# Patient Record
Sex: Female | Born: 1935 | Race: White | Hispanic: No | Smoking: Never smoker
Health system: Southern US, Community
[De-identification: ages and names within clinical notes are randomized; demographics above are authoritative.]

## PROBLEM LIST (undated history)

## (undated) DIAGNOSIS — Z5189 Encounter for other specified aftercare: Secondary | ICD-10-CM

## (undated) DIAGNOSIS — K819 Cholecystitis, unspecified: Secondary | ICD-10-CM

## (undated) DIAGNOSIS — M1990 Unspecified osteoarthritis, unspecified site: Secondary | ICD-10-CM

## (undated) DIAGNOSIS — E785 Hyperlipidemia, unspecified: Secondary | ICD-10-CM

## (undated) DIAGNOSIS — Z9889 Other specified postprocedural states: Secondary | ICD-10-CM

## (undated) DIAGNOSIS — D500 Iron deficiency anemia secondary to blood loss (chronic): Secondary | ICD-10-CM

## (undated) DIAGNOSIS — H269 Unspecified cataract: Secondary | ICD-10-CM

## (undated) DIAGNOSIS — R112 Nausea with vomiting, unspecified: Secondary | ICD-10-CM

## (undated) DIAGNOSIS — R0602 Shortness of breath: Secondary | ICD-10-CM

## (undated) DIAGNOSIS — H409 Unspecified glaucoma: Secondary | ICD-10-CM

## (undated) DIAGNOSIS — I2510 Atherosclerotic heart disease of native coronary artery without angina pectoris: Secondary | ICD-10-CM

## (undated) DIAGNOSIS — I219 Acute myocardial infarction, unspecified: Secondary | ICD-10-CM

## (undated) DIAGNOSIS — I340 Nonrheumatic mitral (valve) insufficiency: Secondary | ICD-10-CM

## (undated) DIAGNOSIS — R5383 Other fatigue: Secondary | ICD-10-CM

## (undated) DIAGNOSIS — I10 Essential (primary) hypertension: Secondary | ICD-10-CM

## (undated) DIAGNOSIS — K5792 Diverticulitis of intestine, part unspecified, without perforation or abscess without bleeding: Secondary | ICD-10-CM

## (undated) DIAGNOSIS — R42 Dizziness and giddiness: Secondary | ICD-10-CM

## (undated) DIAGNOSIS — E876 Hypokalemia: Secondary | ICD-10-CM

## (undated) DIAGNOSIS — K219 Gastro-esophageal reflux disease without esophagitis: Secondary | ICD-10-CM

## (undated) HISTORY — DX: Iron deficiency anemia secondary to blood loss (chronic): D50.0

## (undated) HISTORY — DX: Hypokalemia: E87.6

## (undated) HISTORY — DX: Acute myocardial infarction, unspecified: I21.9

## (undated) HISTORY — DX: Encounter for other specified aftercare: Z51.89

## (undated) HISTORY — DX: Gastro-esophageal reflux disease without esophagitis: K21.9

## (undated) HISTORY — DX: Dizziness and giddiness: R42

## (undated) HISTORY — DX: Other fatigue: R53.83

## (undated) HISTORY — DX: Atherosclerotic heart disease of native coronary artery without angina pectoris: I25.10

## (undated) HISTORY — PX: SHOULDER SURGERY: SHX246

## (undated) HISTORY — DX: Unspecified glaucoma: H40.9

## (undated) HISTORY — PX: SIGMOIDOSCOPY: SUR1295

## (undated) HISTORY — DX: Hyperlipidemia, unspecified: E78.5

## (undated) HISTORY — DX: Essential (primary) hypertension: I10

## (undated) HISTORY — DX: Unspecified cataract: H26.9

## (undated) HISTORY — DX: Nonrheumatic mitral (valve) insufficiency: I34.0

## (undated) HISTORY — PX: PARATHYROIDECTOMY: SHX19

## (undated) HISTORY — PX: CATARACT EXTRACTION: SUR2

## (undated) HISTORY — DX: Cholecystitis, unspecified: K81.9

## (undated) HISTORY — PX: ABDOMINAL HYSTERECTOMY: SHX81

---

## 2007-04-05 ENCOUNTER — Encounter: Admission: RE | Admit: 2007-04-05 | Discharge: 2007-06-19 | Payer: Self-pay | Admitting: Internal Medicine

## 2007-04-12 ENCOUNTER — Encounter: Admission: RE | Admit: 2007-04-12 | Discharge: 2007-04-12 | Payer: Self-pay | Admitting: Internal Medicine

## 2007-04-13 ENCOUNTER — Encounter: Admission: RE | Admit: 2007-04-13 | Discharge: 2007-04-13 | Payer: Self-pay | Admitting: Internal Medicine

## 2007-05-02 ENCOUNTER — Encounter: Admission: RE | Admit: 2007-05-02 | Discharge: 2007-05-02 | Payer: Self-pay | Admitting: Internal Medicine

## 2008-02-06 ENCOUNTER — Encounter: Admission: RE | Admit: 2008-02-06 | Discharge: 2008-03-27 | Payer: Self-pay | Admitting: Orthopedic Surgery

## 2008-04-01 ENCOUNTER — Encounter: Admission: RE | Admit: 2008-04-01 | Discharge: 2008-05-02 | Payer: Self-pay | Admitting: Orthopedic Surgery

## 2010-09-23 ENCOUNTER — Emergency Department (HOSPITAL_COMMUNITY): Payer: Medicare Other

## 2010-09-23 ENCOUNTER — Inpatient Hospital Stay (HOSPITAL_COMMUNITY)
Admission: EM | Admit: 2010-09-23 | Discharge: 2010-09-30 | DRG: 981 | Disposition: A | Discharge status: Home Health | Payer: Medicare Other | Attending: Surgery | Admitting: Surgery

## 2010-09-23 ENCOUNTER — Encounter (HOSPITAL_COMMUNITY): Payer: Self-pay | Admitting: Radiology

## 2010-09-23 DIAGNOSIS — R197 Diarrhea, unspecified: Secondary | ICD-10-CM | POA: Diagnosis present

## 2010-09-23 DIAGNOSIS — Z7982 Long term (current) use of aspirin: Secondary | ICD-10-CM

## 2010-09-23 DIAGNOSIS — R7309 Other abnormal glucose: Secondary | ICD-10-CM | POA: Diagnosis present

## 2010-09-23 DIAGNOSIS — I214 Non-ST elevation (NSTEMI) myocardial infarction: Secondary | ICD-10-CM | POA: Diagnosis not present

## 2010-09-23 DIAGNOSIS — I10 Essential (primary) hypertension: Secondary | ICD-10-CM | POA: Diagnosis present

## 2010-09-23 DIAGNOSIS — I4510 Unspecified right bundle-branch block: Secondary | ICD-10-CM | POA: Diagnosis present

## 2010-09-23 DIAGNOSIS — E785 Hyperlipidemia, unspecified: Secondary | ICD-10-CM | POA: Diagnosis present

## 2010-09-23 DIAGNOSIS — E876 Hypokalemia: Secondary | ICD-10-CM | POA: Diagnosis present

## 2010-09-23 DIAGNOSIS — R1011 Right upper quadrant pain: Secondary | ICD-10-CM

## 2010-09-23 DIAGNOSIS — K219 Gastro-esophageal reflux disease without esophagitis: Secondary | ICD-10-CM | POA: Diagnosis present

## 2010-09-23 DIAGNOSIS — R1310 Dysphagia, unspecified: Secondary | ICD-10-CM | POA: Diagnosis present

## 2010-09-23 DIAGNOSIS — Z6838 Body mass index (BMI) 38.0-38.9, adult: Secondary | ICD-10-CM

## 2010-09-23 DIAGNOSIS — I2510 Atherosclerotic heart disease of native coronary artery without angina pectoris: Secondary | ICD-10-CM | POA: Diagnosis present

## 2010-09-23 DIAGNOSIS — E669 Obesity, unspecified: Secondary | ICD-10-CM | POA: Diagnosis present

## 2010-09-23 DIAGNOSIS — K7689 Other specified diseases of liver: Secondary | ICD-10-CM | POA: Diagnosis present

## 2010-09-23 DIAGNOSIS — K819 Cholecystitis, unspecified: Principal | ICD-10-CM | POA: Diagnosis present

## 2010-09-23 DIAGNOSIS — E119 Type 2 diabetes mellitus without complications: Secondary | ICD-10-CM | POA: Diagnosis present

## 2010-09-23 LAB — URINALYSIS, ROUTINE W REFLEX MICROSCOPIC
Glucose, UA: NEGATIVE mg/dL
Hgb urine dipstick: NEGATIVE
Specific Gravity, Urine: 1.02 (ref 1.005–1.030)
pH: 6 (ref 5.0–8.0)

## 2010-09-23 LAB — CBC
HCT: 39.7 % (ref 36.0–46.0)
Hemoglobin: 14.3 g/dL (ref 12.0–15.0)
MCHC: 36 g/dL (ref 30.0–36.0)

## 2010-09-23 LAB — CK TOTAL AND CKMB (NOT AT ARMC)
CK, MB: 1.5 ng/mL (ref 0.3–4.0)
CK, MB: 2.4 ng/mL (ref 0.3–4.0)
CK, MB: 7.4 ng/mL (ref 0.3–4.0)
Relative Index: INVALID (ref 0.0–2.5)
Relative Index: INVALID (ref 0.0–2.5)

## 2010-09-23 LAB — DIFFERENTIAL
Basophils Absolute: 0 10*3/uL (ref 0.0–0.1)
Lymphocytes Relative: 8 % — ABNORMAL LOW (ref 12–46)
Lymphs Abs: 1.3 10*3/uL (ref 0.7–4.0)
Monocytes Absolute: 1.4 10*3/uL — ABNORMAL HIGH (ref 0.1–1.0)
Neutro Abs: 13.4 10*3/uL — ABNORMAL HIGH (ref 1.7–7.7)

## 2010-09-23 LAB — COMPREHENSIVE METABOLIC PANEL
ALT: 15 U/L (ref 0–35)
Albumin: 2.9 g/dL — ABNORMAL LOW (ref 3.5–5.2)
Alkaline Phosphatase: 79 U/L (ref 39–117)
Glucose, Bld: 161 mg/dL — ABNORMAL HIGH (ref 70–99)
Potassium: 2.9 mEq/L — ABNORMAL LOW (ref 3.5–5.1)
Sodium: 132 mEq/L — ABNORMAL LOW (ref 135–145)
Total Protein: 6.7 g/dL (ref 6.0–8.3)

## 2010-09-23 LAB — BASIC METABOLIC PANEL
CO2: 26 mEq/L (ref 19–32)
GFR calc non Af Amer: 42 mL/min — ABNORMAL LOW (ref >60)
Glucose, Bld: 148 mg/dL — ABNORMAL HIGH (ref 70–99)
Potassium: 3.5 mEq/L (ref 3.5–5.1)
Sodium: 132 mEq/L — ABNORMAL LOW (ref 135–145)

## 2010-09-23 LAB — APTT: aPTT: 28 seconds (ref 24–37)

## 2010-09-23 LAB — PROTIME-INR: INR: 1.12 (ref 0.00–1.49)

## 2010-09-23 LAB — GLUCOSE, CAPILLARY

## 2010-09-23 MED ORDER — IOHEXOL 300 MG/ML  SOLN
75.0000 mL | Freq: Once | INTRAMUSCULAR | Status: AC | PRN
  Administered 2010-09-23: 75 mL via INTRAVENOUS

## 2010-09-24 HISTORY — PX: CORONARY ANGIOPLASTY WITH STENT PLACEMENT: SHX49

## 2010-09-24 LAB — HEPATIC FUNCTION PANEL
Albumin: 2.2 g/dL — ABNORMAL LOW (ref 3.5–5.2)
Bilirubin, Direct: 0.3 mg/dL (ref 0.0–0.3)
Total Bilirubin: 0.6 mg/dL (ref 0.3–1.2)

## 2010-09-24 LAB — BASIC METABOLIC PANEL
BUN: 26 mg/dL — ABNORMAL HIGH (ref 6–23)
CO2: 27 mEq/L (ref 19–32)
Calcium: 6.9 mg/dL — ABNORMAL LOW (ref 8.4–10.5)
Glucose, Bld: 138 mg/dL — ABNORMAL HIGH (ref 70–99)
Sodium: 134 mEq/L — ABNORMAL LOW (ref 135–145)

## 2010-09-24 LAB — HEMOGLOBIN A1C: Hgb A1c MFr Bld: 6.1 % — ABNORMAL HIGH (ref <5.7)

## 2010-09-24 LAB — CARDIAC PANEL(CRET KIN+CKTOT+MB+TROPI)
CK, MB: 9.8 ng/mL (ref 0.3–4.0)
CK, MB: 7.8 ng/mL (ref 0.3–4.0)
Relative Index: 6.4 — ABNORMAL HIGH (ref 0.0–2.5)
Relative Index: 6.6 — ABNORMAL HIGH (ref 0.0–2.5)
Total CK: 154 U/L (ref 7–177)
Total CK: 118 U/L (ref 7–177)
Troponin I: 0.83 ng/mL (ref <0.30)

## 2010-09-24 LAB — GLUCOSE, CAPILLARY
Glucose-Capillary: 169 mg/dL — ABNORMAL HIGH (ref 70–99)
Glucose-Capillary: 89 mg/dL (ref 70–99)

## 2010-09-24 LAB — DIFFERENTIAL
Basophils Relative: 0 % (ref 0–1)
Lymphocytes Relative: 4 % — ABNORMAL LOW (ref 12–46)
Lymphs Abs: 0.7 10*3/uL (ref 0.7–4.0)
Monocytes Relative: 5 % (ref 3–12)
Neutro Abs: 16.9 10*3/uL — ABNORMAL HIGH (ref 1.7–7.7)
Neutrophils Relative %: 91 % — ABNORMAL HIGH (ref 43–77)

## 2010-09-24 LAB — LIPID PANEL
Cholesterol: 118 mg/dL (ref 0–200)
Triglycerides: 39 mg/dL (ref <150)

## 2010-09-24 LAB — CBC
HCT: 35.2 % — ABNORMAL LOW (ref 36.0–46.0)
Hemoglobin: 12.3 g/dL (ref 12.0–15.0)
MCH: 32.7 pg (ref 26.0–34.0)
RBC: 3.76 MIL/uL — ABNORMAL LOW (ref 3.87–5.11)

## 2010-09-24 LAB — MRSA PCR SCREENING: MRSA by PCR: NEGATIVE

## 2010-09-24 LAB — CK TOTAL AND CKMB (NOT AT ARMC): Relative Index: 5.9 — ABNORMAL HIGH (ref 0.0–2.5)

## 2010-09-25 ENCOUNTER — Inpatient Hospital Stay (HOSPITAL_COMMUNITY): Payer: Medicare Other

## 2010-09-25 LAB — CBC
MCH: 32.8 pg (ref 26.0–34.0)
MCHC: 34.9 g/dL (ref 30.0–36.0)
Platelets: 265 10*3/uL (ref 150–400)

## 2010-09-25 LAB — COMPREHENSIVE METABOLIC PANEL
ALT: 151 U/L — ABNORMAL HIGH (ref 0–35)
CO2: 30 mEq/L (ref 19–32)
Calcium: 8.1 mg/dL — ABNORMAL LOW (ref 8.4–10.5)
Creatinine, Ser: 0.8 mg/dL (ref 0.50–1.10)
GFR calc Af Amer: >60 mL/min (ref >60)
GFR calc non Af Amer: >60 mL/min (ref >60)
Glucose, Bld: 103 mg/dL — ABNORMAL HIGH (ref 70–99)
Sodium: 134 mEq/L — ABNORMAL LOW (ref 135–145)

## 2010-09-25 LAB — CARDIAC PANEL(CRET KIN+CKTOT+MB+TROPI): Total CK: 84 U/L (ref 7–177)

## 2010-09-25 LAB — GLUCOSE, CAPILLARY: Glucose-Capillary: 94 mg/dL (ref 70–99)

## 2010-09-25 MED ORDER — IOHEXOL 300 MG/ML  SOLN
50.0000 mL | Freq: Once | INTRAMUSCULAR | Status: AC | PRN
  Administered 2010-09-25: 17:00:00 10 mL via INTRAVENOUS

## 2010-09-26 LAB — GLUCOSE, CAPILLARY
Glucose-Capillary: 100 mg/dL — ABNORMAL HIGH (ref 70–99)
Glucose-Capillary: 100 mg/dL — ABNORMAL HIGH (ref 70–99)
Glucose-Capillary: 85 mg/dL (ref 70–99)

## 2010-09-27 LAB — CBC
HCT: 30.4 % — ABNORMAL LOW (ref 36.0–46.0)
MCH: 33.1 pg (ref 26.0–34.0)
MCHC: 34.9 g/dL (ref 30.0–36.0)
RDW: 13 % (ref 11.5–15.5)

## 2010-09-27 LAB — COMPREHENSIVE METABOLIC PANEL
Albumin: 2.1 g/dL — ABNORMAL LOW (ref 3.5–5.2)
Alkaline Phosphatase: 95 U/L (ref 39–117)
BUN: 13 mg/dL (ref 6–23)
Calcium: 8.5 mg/dL (ref 8.4–10.5)
GFR calc Af Amer: >60 mL/min (ref >60)
Glucose, Bld: 99 mg/dL (ref 70–99)
Potassium: 3 mEq/L — ABNORMAL LOW (ref 3.5–5.1)
Total Protein: 5.7 g/dL — ABNORMAL LOW (ref 6.0–8.3)

## 2010-09-27 LAB — GLUCOSE, CAPILLARY: Glucose-Capillary: 88 mg/dL (ref 70–99)

## 2010-09-28 LAB — HEMOGLOBIN A1C
Hgb A1c MFr Bld: 6.2 % — ABNORMAL HIGH (ref <5.7)
Mean Plasma Glucose: 131 mg/dL — ABNORMAL HIGH (ref <117)

## 2010-09-28 LAB — BODY FLUID CULTURE

## 2010-09-29 LAB — CBC
HCT: 33.4 % — ABNORMAL LOW (ref 36.0–46.0)
MCHC: 35.3 g/dL (ref 30.0–36.0)
Platelets: 312 10*3/uL (ref 150–400)
RDW: 12.6 % (ref 11.5–15.5)
WBC: 6.8 10*3/uL (ref 4.0–10.5)

## 2010-09-29 LAB — COMPREHENSIVE METABOLIC PANEL
ALT: 32 U/L (ref 0–35)
AST: 11 U/L (ref 0–37)
Albumin: 2.3 g/dL — ABNORMAL LOW (ref 3.5–5.2)
Alkaline Phosphatase: 88 U/L (ref 39–117)
BUN: 13 mg/dL (ref 6–23)
Chloride: 97 mEq/L (ref 96–112)
Potassium: 3 mEq/L — ABNORMAL LOW (ref 3.5–5.1)
Sodium: 138 mEq/L (ref 135–145)
Total Bilirubin: 0.4 mg/dL (ref 0.3–1.2)
Total Protein: 5.9 g/dL — ABNORMAL LOW (ref 6.0–8.3)

## 2010-09-30 LAB — POTASSIUM: Potassium: 3.5 mEq/L (ref 3.5–5.1)

## 2010-10-01 ENCOUNTER — Other Ambulatory Visit (INDEPENDENT_AMBULATORY_CARE_PROVIDER_SITE_OTHER): Payer: Self-pay | Admitting: General Surgery

## 2010-10-01 DIAGNOSIS — Z9049 Acquired absence of other specified parts of digestive tract: Secondary | ICD-10-CM

## 2010-10-01 NOTE — H&P (Signed)
[name], [name] NO.:  [id number]  MEDICAL RECORD NO.:  [id number]  LOCATION:  [facility]                         FACILITY:  [facility]  PHYSICIAN:  Leighton Ruff. Redmond Pulling, MD     DATE OF BIRTH:  September 21, 1933  DATE OF ADMISSION:  09/23/2010 DATE OF DISCHARGE:                             HISTORY & PHYSICAL   REFERRING PHYSICIAN:  Blanchie Dessert, MD  PRIMARY CARE DOCTOR:  Arlyss Repress, MD, [facility].  REASON FOR COMPLAINT:  Abdominal pain in the right upper abdomen.  BRIEF HISTORY:  The patient is a 77 year old white female who woke up yesterday complaining of chest and abdominal discomfort with some nausea, start around 6:30, it was in her chest or abdomen went to her back.  She could not really describe the pain she said "chest hurt."  It was constant.  She had some nausea with onset of the pain.  She tried taking some of her blood pressure medicines and subsequently vomited. She has a history of GERD, but this pain is different.  She has GERD symptoms at least three times per week and sounds like mostly when she is lying down.  She has problems with nausea and vomiting associated with her GERD.  This pain she describes as being worse than her GERD. It was relieved by medicine here in the ER.  We were asked to see after ER evaluation.  PAST MEDICAL HISTORY: 1. GERD for 10 plus years on a PPI. 2. Hypertension. 3. Dyslipidemia. 4. Glucose elevation, recently started on metformin. 5. Arthritis.  She says this is all over her back and joints are the     primary source of discomfort. 6. She has a history of dyspnea on exertion, underwent a stress test     and then a cardiac catheterization 15 plus years ago nothing since. 7. 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 8. Dysphagia.  She has had trouble swallowing pills for 15 plus years. 9. History of anxiety attacks in the past and some depression. 10. AODM on Metformin  PAST SURGICAL  HISTORY: 1. She had a parathyroidectomy. 2. She has had 2 cataract surgeries 3. Status post hysterectomy. 4. Status post lipoma resection 5. History of surgery on her right shoulder.  FAMILY HISTORY:  Mother died at 80 with dementia.  Father died with history of more than one MI and high blood pressure.  Two brothers, one died of alcohol abuse, one with coronary artery disease.  No sisters. She has two children, both in good health.  SOCIAL HISTORY:  Tobacco never.  Alcohol, she drinks bourbon more than a time less than a fifth per week for at least 10 years.  Drugs:  None. She is a widowed.  She worked [employer] all of her life.  REVIEW OF SYSTEMS:  Fever, none reported.  She thinks she had a fever yesterday.  Skin:  No changes.  Weight stable.  CV:  Positive for dizziness, it sounds orthostatic in nature, may be related to her blood pressure medicines.  No history of stroke or seizure.  Psych:  Positive for some mild increase depression since leaving her home and living in [place].  PULMONARY:  She has dyspnea on exertion walking as noted above, it has been ongoing for 15 years.  No orthopnea.  No PND.  She did snore actually got better after shoulder surgery.  No coughing or wheezing.  No documented apnea.  CARDIAC:  She denies any chest pain or palpitations.  Workup in the past was for DOE.  GI is positive for GERD. It is better with PPI but not completely relieved.  She has symptoms almost nightly.  She has occasional nausea and vomiting.  She has frequent diarrhea.  No blood in her stool.  No trouble voiding.  No odor discomfort.  Lower Extremities:  No edema or claudication.  She does have edema if she comes off her fluid-filled.  MUSCULOSKELETAL:  She has problem with her back and her knees and her hip which is chronic.  CURRENT MEDICATIONS: 1. Nexium 40 mg daily. 2. Chlorthalidone 25 mg daily. 3. Crestor 5 mg h.s. 4. Norvasc 2.5 mg daily. 5. Diovan 320 mg  daily. 6. Combigan ophthalmic 1 drops left eye b.i.d. 7. Metformin 500 mg tablets 1/2 tablet h.s. 8. Methocarbamol 500 mg 1-2 p.o. q.6 h. 9. Systane ophthalmic drops nightly p.r.n. for dry eyes.  ALLERGIES:  SULFA causes a rash.  PHYSICAL EXAMINATION:  GENERAL:  Well-nourished, well-developed overweight white female in no acute distress. VITAL SIGNS:  Temperature on admission was 98.2, heart rate is 111, blood pressure 118/57, and sats are 93% on room air.  The patient reports her height at 63 inches and weight 218 pounds. HEENT:  Head is normocephalic.  Ears, nose, throat, and mouth within normal limits. NECK:  Trachea is in midline and bruits.  No JVD. CHEST:  Clear to auscultation and percussion.  Nontender to palpation. CARDIAC:  Normal S1 and S2.  No murmurs or rubs.  Pulses are +2 and equal both extremities. ABDOMEN:  Soft, nondistended.  She is tender mostly in the right upper quadrant, but also the right lower quadrant.  No hernias, masses, or abscesses. GU/RECTAL:  Deferred. LYMPHADENOPATHY:  None palpated cervical or inguinal areas. SKIN:  No changes noted. NEUROLOGIC:  No focal deficits.  Cranial nerves II through XII grossly intact. PSYCH:  Normal affect.  LABORATORY DATA:  UA was normal.  Lipase 42.  CK is 40.  Troponin was less than 0.3.  Sodium is 132, potassiums 2.9, chloride is 91, CO2 is 26, BUN is 13, creatinine is 0.77, glucose is 161, total bilirubin is 0.9, alk phos 79, SGOT 14, SGPT is 15, white count of 16.1, hemoglobin is 14.3, hematocrit is 39.7, and platelets are 313,000.  DIAGNOSTICS:  Abdominal ultrasound shows no stones, no gallbladder wall thickening, and pericholecystic fluid, and did show a fatty liver.  CT scan showed gallbladder was distended with pericholecystic fluid and again no stones, no biliary dilatation.  Abnormal liver with steatosis and parenchymal abnormalities.  Analysis was suggestive of acute acalculous cholecystitis.  There is  no ascending and sigmoid diverticulosis, but no diverticulitis.  Also liver disease on CT.  IMPRESSION: 1. Acalculous cholecystitis. 2. Hepatic steatosis with a history of alcohol use. 3. Hypertension. 4. Gastroesophageal reflux disease. 5. Dyslipidemia. 6. Elevated glucose. 7. History of noncritical coronary artery disease with cardiac     catheterization 10 plus years ago. 8. History of parathyroid tumor resection. 9. History of anxiety and depression, currently on no recs. 10.Hypokalemia. 11.Body mass index of 38.6. 12.Dysphagia.  PLAN:  We are going to hydrate the patient, replace K, we will get an EKG and chest  x-ray.  I am going to start her on antibiotics and discuss cholecystectomy after she has been reviewed by Dr. Redmond Pulling. If there are EKG abnormalities will check cardiac enzymes.     Lydia Guiles, P.A.   ______________________________ Leighton Ruff. Redmond Pulling, MD    WDJ/MEDQ  D:  09/23/2010  T:  09/23/2010  Job:  [id number]  cc:   Arlyss Repress, MD  Electronically Signed by Earnstine Regal P.A. on 09/26/2010 09:49:40 PM Electronically Signed by Greer Pickerel M.D. on 10/01/2010 10:44:40 PM

## 2010-10-02 ENCOUNTER — Telehealth (INDEPENDENT_AMBULATORY_CARE_PROVIDER_SITE_OTHER): Payer: Self-pay | Admitting: General Surgery

## 2010-10-02 NOTE — Telephone Encounter (Signed)
[name] with [facility] called re: patient requesting dressing change orders. Patient has cholecystostomy tube and still had dressing from hospital around tube. [name] changed dressing, but called for order and request to teach family. I called [name] and gave okay for cleaning area with antibiotic soap, then covering with dry gauze. She will teach family and call with any problems. States tube is able to be flushed without issue.

## 2010-10-03 NOTE — Discharge Summary (Signed)
[name], [name] NO.:  [id number]  MEDICAL RECORD NO.:  [id number]  LOCATION:  [id number]                         FACILITY:  [facility]  PHYSICIAN:  Coralie Keens, M.D. DATE OF BIRTH:  02-Dec-1933  DATE OF ADMISSION:  09/23/2010 DATE OF DISCHARGE:  09/30/2010                              DISCHARGE SUMMARY   ADMISSION DIAGNOSES: 1. Acalculous cholecystitis. 2. Hepatic steatosis. 3. Hypertension. 4. Gastroesophageal reflux disease. 5. Dyslipidemia. 6. Elevated glucose, on low dose metformin. 7. Noncritical coronary artery disease with cardiac catheterization 10     years ago for dyspnea on exertion. 8. History of parathyroid tumor resection. 9. History of anxiety and depression, currently on no treatment. 10.Hypokalemia. 11.Body mass index of 38.6. 12.Dysphagia.  DISCHARGE DIAGNOSES: 1. Acalculous cholecystitis. 2. Non-ST elevation myocardial infarction.  Peak troponin was 0.91.     Status post cardiac catheterization on September 24, 2010, Dr. Einar Gip     with percutaneous transluminal coronary angioplasty and stenting of     the left anterior descending, 2 sites using Resolute stents into     the proximal descending left anterior descending and diagonal     branch of the left anterior descending. 3. Hypokalemia. 4. Hypertension. 5. Hepatic steatosis. 6. Gastroesophageal reflux disease. 7. Dyslipidemia. 8. Ongoing diarrhea. 9. Glucose elevation, on low-dose metformin. 10.Dysphagia. 11.History of anxiety and depression. 12.BMI of 38.  PROCEDURES: 1. Cardiac catheterization on September 24, 2010, Dr. Einar Gip, ejection     fraction of 60%.  Left anterior descending showed 80% stenosis in     the proximal left anterior descending and 80-90% stenosis of the     ostium of the diagonal.  Status post percutaneous transluminal     coronary angioplasty and stenting of these lesions with Resolute     drug-eluting stents. 2. Percutaneous cholecystostomy by Interventional  Radiology on September 26, 2010.  BRIEF HISTORY:  The patient is a 77 year old female who presented to the emergency room complaining of chest and abdominal discomfort with some nausea which started around 6:30 on the day of admission.  It was in her chest, her abdomen, and went to her back.  She did not really describe the pain.  She said it just hurts, it was constant.  She had some nausea with the onset.  She tried taking some of her blood pressure medicines and subsequently vomited.  She has a history of GERD, but this pain is different.  She had GERD symptoms at least three times per week and it sounds like it is most significant when she is lying down.  She has problems with nausea and vomiting associated with her GERD.  This pain is worse than her GERD.  It was relieved by medicines in the ER, and we were asked to see her after the ER evaluation.  Workup in the ER includes labs within a normal  UA and lipase of 42.  Troponin was less than 0.3.  Potassium was 2.9, creatinine was 0.77, BUN was 13, glucose was 161.  LFTs were normal on admission.  Abdominal ultrasound showed no stones and no gallbladder wall thickening or pericholecystic fluid.  It did show a fatty liver.  CT scan showed  the gallbladder was distended with fluid, again no stones and no biliary dilatation consistant with acalculous  Cholecystitis.  For further history and physical, please see the dictated note.  HOSPITAL COURSE:  The patient was seen in the ER and admitted.  Her potassium was extremely low, we started replacing that.  Ordered an EKG and a chest x-ray.  She subsequently had some arrhythmia in the ER while on the monitor. Started her on antibiotics.  She developed a sinus tachycardia with rate up to 136 with a right bundle branch block and some ST depression in the lateral leads.  She was then seen by Dr. Christen Butter, who ordered further CK and troponins.  She was found to have a non-ST elevation MI with  peak troponin of 0.91.  She underwent cardiac catheterization following morning by Dr. Einar Gip and had 2 stents placed, one in the proximal LAD and one in the diagonal.  The patient did well after that and was seen by IR and percutaneous drainage of her gallbladder.  She has been maintained on antibiotics.  She has made slow progress.  She has had problems with some diarrhea.  She felt she was a bit dizzy and uncomfortable ambulating.  She has been seen by OT and PT with recommendations for home health OT, PT, a home health aide, and home health nurse to help with the drain.  She has remained afebrile, making progress, and it was Dr. Trevor Mace opinion she would probably be ready for discharge in the a.m. of September 30, 2010.  We are going to send her home with the drain; home health to help with care of the percutaneous cholecystostomy.  We will continue her on antibiotics that is listed below.  We will have her come back and see Dr. Redmond Pulling in 2 weeks on October 14, 2010, at 9:00 a.m.  She is to contact Dr. Einar Gip for followup In his office in 2 weeks.  DISCHARGE MEDICATIONS:  She will continue: 1. Aspirin 81 mg daily. 2. Chlorthalidone 25 mg daily. 3. Combigan ophthalmic drops 1 drop to left eye b.i.d. 4. Crestor 5 mg daily. 5. Diovan 320 mg daily. 6. Metformin 500 mg half tablet daily. 7. Methocarbamol p.r.n. for muscle spasms. 8. Nexium 40 mg daily. 9. Systane Ultra OTC eye lubrication. 10.We have discontinued her amlodipine 2.5 mg every other day.  NEW MEDICINES: 1. Augmentin 875 one tablet b.i.d. for 7 days. 2. Hydrocodone/APAP 5/325 one-two q.4 p.r.n. 3. Metoprolol 25 mg b.i.d., to be refilled by Dr. Einar Gip. 4. Potassium chloride 20 mEq b.i.d. with meals for 7 days. Follow up     with Dr. Einar Gip. 5. Brilinta 90 mg b.i.d., follow up and refill by Dr. Einar Gip.  CONDITION ON DISCHARGE:  Improving.     Lydia Guiles, P.A.   ______________________________ Coralie Keens,  M.D.    WDJ/MEDQ  D:  09/29/2010  T:  09/30/2010  Job:  [id number]  cc:   Laverda Page, MD Arlyss Repress, MD  Electronically Signed by Earnstine Regal P.A. on 10/01/2010 10:46:50 AM Electronically Signed by Coralie Keens M.D. on 10/03/2010 10:45:03 AM

## 2010-10-12 ENCOUNTER — Encounter (INDEPENDENT_AMBULATORY_CARE_PROVIDER_SITE_OTHER): Payer: Self-pay | Admitting: General Surgery

## 2010-10-14 ENCOUNTER — Encounter (INDEPENDENT_AMBULATORY_CARE_PROVIDER_SITE_OTHER): Payer: Self-pay | Admitting: General Surgery

## 2010-10-14 ENCOUNTER — Ambulatory Visit (INDEPENDENT_AMBULATORY_CARE_PROVIDER_SITE_OTHER): Payer: Medicare Other | Admitting: General Surgery

## 2010-10-14 VITALS — BP 126/78 | HR 80 | Temp 96.0°F | Ht 63.0 in | Wt 206.6 lb

## 2010-10-14 DIAGNOSIS — K819 Cholecystitis, unspecified: Secondary | ICD-10-CM

## 2010-10-14 NOTE — Patient Instructions (Signed)
Flush gallbladder drain once daily.  Call for fever (temp >101.5), worsening abdominal pain, persistent nausea/vomiting, jaundice, or drain problems

## 2010-10-14 NOTE — Progress Notes (Signed)
[name] is a 77 y.o. female.    Chief Complaint  Patient presents with  . Other    f/u gb    HPI HPI 68 year old obese Caucasian female who I initially met in the hospital in late June 2012 with abdominal pain in the right upper abdomen. She was found to have acute acalculous cholecystitis. There were no stones demonstrated on ultrasound or CT imaging. She was initially planned to have a laparoscopic cholecystectomy however she developed chest pressure which prompted an EKG as well as cardiac enzymes. She was found to have a non-ST elevated myocardial infarction. Dr. Einar Gip performed a cardiac catheterization in place several drug-eluting stents into her left anterior descending, proximal descending left anterior and diagonal branch of her LAD. Because of the myocardial infarction she underwent placement of a percutaneous cholecystostomy tube on June 30.  She was discharged from the hospital on July 4. Since discharge she states that she's had problems with dizziness. She states that she'll get dizzy or lightheaded when she gets up from a seated position. She denies any nausea, vomiting, or abdominal pain. She denies any fevers or chills. She denies any jaundice, diarrhea or constipation. She states that her appetite is fairly normal. Currently her son is flushing her drain twice a day. She is draining about 4 ounces per day of bile from her cholecystostomy tube. She also complains of some shortness of breath. She is scheduled to see Dr. Einar Gip later today.  Past Medical History  Diagnosis Date  . Diabetes mellitus   . Hypertension   . GERD (gastroesophageal reflux disease)   . Dyslipidemia   . Coronary artery disease   . Hypokalemia   . Acalculous cholecystitis   . Myocardial infarction   . Dizzy   . Fatigue     Past Surgical History  Procedure Date  . Parathyroidectomy     tumor excision  . Abdominal hysterectomy   . Shoulder surgery     right  . Cataract extraction     2   . Coronary angioplasty with stent placement 09/24/10    drug eluting stents    Family History  Problem Relation Age of Onset  . Heart disease Father   . Heart disease Brother     Social History History  Substance Use Topics  . Smoking status: Never Smoker   . Smokeless tobacco: Not on file  . Alcohol Use: Yes    Allergies  Allergen Reactions  . Sulfur     Current Outpatient Prescriptions  Medication Sig Dispense Refill  . aspirin 81 MG tablet Take 81 mg by mouth daily.        . brimonidine-timolol (COMBIGAN) 0.2-0.5 % ophthalmic solution Place 1 drop into the left eye every 12 (twelve) hours.        . chlorthalidone (HYGROTON) 25 MG tablet Take 25 mg by mouth daily.        Marland Kitchen esomeprazole (NEXIUM) 40 MG capsule Take 40 mg by mouth daily before breakfast.        . HYDROcodone-acetaminophen (NORCO) 5-325 MG per tablet Take 1 tablet by mouth every 6 (six) hours as needed.        . metFORMIN (GLUMETZA) 500 MG (MOD) 24 hr tablet Take 500 mg by mouth daily with breakfast. Half tablet       . methocarbamol (ROBAXIN) 500 MG tablet Take 500 mg by mouth as needed.        . metoprolol succinate (TOPROL-XL) 25 MG 24 hr tablet Take  25 mg by mouth 2 (two) times daily.        Vladimir Faster Glycol-Propyl Glycol (SYSTANE ULTRA OP) Apply to eye.        . rosuvastatin (CRESTOR) 5 MG tablet Take 5 mg by mouth daily.        . Ticagrelor (BRILINTA) 90 MG TABS tablet Take 90 mg by mouth 2 (two) times daily.        . valsartan (DIOVAN) 320 MG tablet Take 320 mg by mouth daily.          Review of Systems Review of Systems  Constitutional: Negative for fever, chills and weight loss.  HENT: Negative.   Eyes: Negative.   Respiratory: Positive for shortness of breath.   Cardiovascular: Negative for chest pain.       Lightheadedness when getting up from seated position. +DOE  Gastrointestinal: Negative.   Genitourinary: Negative.   Skin: Negative.   Neurological: Negative.     Psychiatric/Behavioral: Negative.     Physical Exam Physical Exam  Vitals reviewed. Constitutional: She is oriented to person, place, and time. She appears well-developed and well-nourished.  HENT:  Head: Normocephalic and atraumatic.  Eyes: Pupils are equal, round, and reactive to light. No scleral icterus.  Neck: Normal range of motion. No tracheal deviation present.  Cardiovascular: Normal rate, regular rhythm and normal heart sounds.   Respiratory: Effort normal and breath sounds normal. No respiratory distress. She has no wheezes.  GI: Soft. Bowel sounds are normal. She exhibits no distension. There is no tenderness. There is no guarding.       Percutaneous drain Rt lat abdomina wall- drain site- no infection.  Some green bile in drain bag  Musculoskeletal: Normal range of motion.  Neurological: She is alert and oriented to person, place, and time.  Skin: Skin is warm and dry. She is not diaphoretic. No pallor.  Psychiatric: She has a normal mood and affect. Her behavior is normal. Judgment normal.     Blood pressure 126/78, pulse 80, temperature 96 F (35.6 C), height 5\' 3"  (1.6 m), weight 206 lb 9.6 oz (93.713 kg).  Data reviewed: I reviewed her history and physical from June 27 as well as her discharge summary from July 4.  Assessment/Plan  77 year old Caucasian female with recent acalculous acute cholecystitis status post percutaneous cholecystostomy tube placement and a non-ST elevated myocardial infarction status post cardiac stent placement.  She seems to be doing quite well with respect to her gallbladder. I've recommended that we leave the drain in for at least another 6 weeks. We will need to get a cholangiogram through the cholecystostomy tube in 6 weeks. This is to evaluate bile duct patency. If her bile duct are normal I recommend removal of the cholecystostomy tube. My preference is to leave her gallbladder alone for now and not offer her a cholecystectomy. Since  she did not have any evidence of gallstones on ultrasound or CT imaging I believe her chance of a repeat episode of cholecystitis his low. Moreover we need to avoid a cholecystectomy for at least 6 months because of her recent myocardial infarction. I contacted Dr. Irven Shelling office to see if they can see her earlier today because of her ongoing dizziness which sounds like it might be some orthostatic hypotension. I'll see her in 4 weeks.  Greer Pickerel M 10/14/2010, 10:12 AM

## 2010-10-15 ENCOUNTER — Telehealth (INDEPENDENT_AMBULATORY_CARE_PROVIDER_SITE_OTHER): Payer: Self-pay

## 2010-10-15 NOTE — Telephone Encounter (Signed)
Debbie from [facility] called regarding [name] to ask if she was able to take a shower/bath.  I paged Dr. Redmond Pulling who said that would be fine.

## 2010-10-17 NOTE — Cardiovascular Report (Signed)
[name], [name] NO.:  [id number]  MEDICAL RECORD NO.:  [id number]  LOCATION:  6526                         FACILITY:  [facility]  PHYSICIAN:  Laverda Page, MD DATE OF BIRTH:  08-17-1933  DATE OF PROCEDURE:  09/24/2010 DATE OF DISCHARGE:                           CARDIAC CATHETERIZATION   PROCEDURE PERFORMED:  Left heart catheterization including: 1. Left ventriculography. 2. Selective right and left coronary arteriography. 3. Percutaneous transluminal coronary angioplasty and kissing balloon     angioplasty and stenting of the proximal and mid left anterior     descending and diagonal 1 branch of the and left anterior     descending with implantation of 4.0 x 18 mm and a 3.5 x 9 mm     Resolute stent into the proximal and mid left anterior descending     and a 3.0 x 12 mm Resolute stent into the diagonal branch of the     left anterior descending.  INDICATIONS:  Ms. Mummey is a 77 year old female with history of hypertension who was admitted to the hospital with chest pain and also abdominal pain and was found to have acalculous cholecystitis.  In the interim, because of chest discomfort and abnormal EKG, cardiac markers were done which were positive for non-ST-elevation myocardial infarction.  Because of her multiple cardiovascular risk factors and the positive cardiac markers, she was brought to the Cardiac Catheterization Lab to evaluate her coronary anatomy.  HEMODYNAMIC DATA:  The left ventricular pressure was 119/10 with end- diastolic pressure of 21 mmHg.  Aortic pressure was 115/59 with a mean of 84 mmHg.  There was no pressure gradient across the aortic valve.  ANGIOGRAPHIC DATA:  Left ventricle:  The left ventricular systolic function was normal with ejection fraction of 60%.  There was no significant wall motion abnormality.  Right coronary artery:  The right coronary artery is a large-caliber and dominant vessel, smooth and  normal.  Left main coronary artery:  Left main coronary artery is a large-caliber vessel, smooth and normal.  LAD:  LAD is a very large-caliber vessel giving origin to very large diagonal 1.  It is smooth and normal except at the origin of the diagonal, there is an 80% stenosis followed in the proximal LAD and there is an 80-90% stenosis in the ostium of the diagonal 1.  Circumflex:  Circumflex artery is a very large-caliber vessel, it is smooth and normal.  INTERVENTIONAL DATA:  Successful PTCA and stenting of the proximal LAD and mid LAD with overlapping 4.0 x 18 mm and 3.5 x 9 mm Resolute stents which were deployed at 10 atmospheric pressure and 9 atmospheric pressure and postdilated with a 4.0 x 15 mm Langley Trek at 16 atmospheric pressure.  The 3.5-mm stent was postdilated the same stent balloon pulling it back gently within the previously placed stent.  Successful PTCA and stenting of the diagonal branch of the LAD with implantation of a 3.0 x 12 mm Resolute stent which was deployed at 8 atmospheric pressure for 30 seconds followed by gentle pullback into the proximal LAD and second dilatation at 10 atmospheric pressure for 20 seconds.  Overall stenosis was reduced from 80% in the LAD to 0%  and 90% in the diagonal 1 to 0%.  The kissing balloon angioplasty was performed with a 2.5 x 12 mm Trek balloon in the diagonal and the 3.5 x 9 mm Resolute stent balloon into the LAD at 10 atmospheric pressure in the diagonal and 12 atmospheric pressure in the LAD.  Overall excellent results were evident.  RECOMMENDATIONS:  The patient did very well.  A total of 215 mL of contrast was utilized for diagnostic and intervention procedure.  No immediate complications were noted.  RECOMMENDATIONS:  I have discussed the patient's situation with Dr. Greer Pickerel from [facility] regarding her cholecystitis.  The patient will be scheduled for a percutaneous drainage of her  gallbladder for acalculous cholecystitis.  I will take the patient over to my care.  She will potentially be discharged home probably in the next 48 hours.  TECHNIQUE OF PROCEDURE:  Under sterile precautions using a 6-French right radial access, a 6-French TIG #4 catheter was advanced into the ascending aorta and selective left and right coronary arteriography was performed via the catheter and then pulled out of body.  A 5-French pigtail catheter was utilized to perform left ventriculography in the RAO projection.  The catheter was then pulled out of the body.  TECHNIQUE OF INTERVENTION:  Using a Ikari 3.5 guide catheter, 6-French guide catheter, it was advanced into the ascending aorta and left main coronary artery was selectively engaged.  Using Intuition Medtronic guidewire, I was able to cross into the LAD.  I also used a Luge wire into the diagonal branch of the LAD.  Initially, predilatation was performed into the diagonal branch to protect it from stent jailing.  A 2.5 x 15 mm Trek balloon was utilized for performing angioplasty of the diagonal branch.  This was done from 8 atmospheric pressure to a peak of 10 atmospheric pressure from 20-60 seconds.  Having performed this, excellent results were evident.  Now, we proceeded with stenting of the LAD.  This stenting of the LAD with a 4.0 x 18 mm Resolute stent led to an edge dissection in the LAD and also the diagonal branch was compromised with haziness.  Because of a very large-sized diagonal, the previously double-wired LAD and diagonal guidewires were repositioned and I was able to cross the diagonal branch again through the stent struts.  I used a 2.5 x 15 mm trek balloon and second angioplasty was performed at 6 atmospheric pressure for 60 seconds.  In spite of this, there was persistent haziness and high-grade stenosis was evident. Hence we decided to stent this with a 3.0 x 12 mm Resolute stent which was stented with  excellent results at 8 atmospheric pressure for 30 seconds gently pulling it back into this stent strut and second inflation at 10 atmospheric pressure for 20 seconds.  This was followed by placement of a LAD distal edge dissection with a 3.5 x 9 mm Resolute stent.  The same stent balloon was utilized to perform kissing balloon angioplasty into the LAD and a new 2.5 x 12 mm Trek balloon was advanced into the diagonal branch and kissing balloon angioplasty was performed with simultaneous balloon inflations.  Having performed this, excellent results were evident.  During the procedure, intracoronary nitroglycerin was also administered.  The guidewires were withdrawn.  Angiography repeated.  Guide catheter disengaged and pulled out of body.  Overall, the patient tolerated the procedure well and no immediate complications.    Laverda Page, MD    JRG/MEDQ  D:  09/24/2010  T:  09/24/2010  Job:  [id number]  cc:   Arlyss Repress, MD  Electronically Signed by [name] MD on 10/17/2010 01:53:55 PM

## 2010-10-17 NOTE — Consult Note (Signed)
NAMELINETT, SALIZAR NO.:  [id number]  MEDICAL RECORD NO.:  [id number]  LOCATION:  2606                         FACILITY:  [facility]  PHYSICIAN:  Laverda Page, MD DATE OF BIRTH:  20-Jan-1934  DATE OF CONSULTATION:  09/23/2010 DATE OF DISCHARGE:                                CONSULTATION   REASON FOR CONSULTATION:  Preop cardiovascular examination.  HISTORY:  Ms. [name] is a very pleasant 77 year old female with history of hypertension and diabetes, controlled with metformin. She had been doing well until yesterday.  She had severe diffuse abdominal discomfort and chest discomfort and was extremely nauseous, could not eat eating much the whole day yesterday.  This morning when she woke up, her pain was mostly now localized to the right upper quadrant of her abdomen.  She presented to the emergency room where she was diagnosed with acalculous cholecystitis and she is scheduled for a cholecystectomy in the morning.  However, because of abnormal troponins, she has been consulted for further cardiac risk stratification.  The patient denies any chest pain, denies any shortness of breath, denies any paroxysmal nocturnal dyspnea or orthopnea.  Prior to the episode, she has been fairly active.  In fact, she has been taking care of her grandsons trying to help her divorced son taking care of the children while he is away on overseas travel.  She had been driving them and taking them around and dropping them off without any significant chest discomfort or dyspnea on exertion, PND, or orthopnea.  Presently, she has no chest pain, no palpitations.  REVIEW OF SYSTEMS:  She complains of severe abdominal discomfort.  She also has had history of GERD in the past.  She is diabetic but has been told that her blood sugars have been extremely well controlled.  There is no recent weight gain or weight loss.  She has not had any TIA or seizure disorder.  Other  review of systems are negative.  She has not had any dark stools or GI bleed.  FAMILY HISTORY:  There is no history of premature coronary artery disease in family or diabetes in family.  SOCIAL HISTORY:  She is widowed, lives by herself very close to her son. She does not drink alcohol, does not use any tobacco products.  HOME MEDICATIONS: 1. Nexium 40 mg p.o. daily. 2. Chlorthalidone 25 mg p.o. daily. 3. Crestor 5 mg p.o. daily. 4. Norvasc 2.5 mg p.o. daily. 5. Diovan 321 one p.o. daily. 6. Combigan ophthalmic drops b.i.d. 7. Metformin 5 mg half tablet p.o. nightly. 8. Methocarbamol 500 mg 1-2 tablets q.6 h. p.r.n.  ALLERGIES:  SULFA which causes her to have rash.  PHYSICAL EXAMINATION:  GENERAL:  She is moderately built and obese.  She appears to be in no acute distress. VITAL SIGNS:  Temperature of 98.2, heart rate is 115 beats per minute, respirations 12-14, blood pressure is 122/76 mmHg. CARDIAC:  S1 and S2 are normal without any gallops or murmur. CHEST:  Bilaterally equal breath sounds without any rhonchi or crackles. ABDOMEN:  Tender at right hypogastrium and also diffusely in the epigastrium.  The bowel sounds were heard. EXTREMITIES:  Full range of movements without any  edema.  Peripheral arterial examination was normal.  PERTINENT FINDINGS:  Ultrasound of the abdomen had revealed a distended gallbladder suggestive of acute cholecystitis, but no stones were evident.  Chest x-ray revealed low lung volumes and bibasilar atelectases without any congestive heart failure or pulmonary edema.  CT of the abdomen and pelvis revealed acute acalculous cholecystitis. Descending and sigmoid colon diverticulosis.  Diffuse hepatic steatosis. Of note, no mention was done of any significant atherosclerotic changes of abdominal aorta.  IMPRESSION: 1. Acute acalculous cholecystitis. 2. Abnormal EKG showing right bundle-branch block, sinus tachycardia,     and premature ventricular  contraction. 3. Abnormal troponins which bumped from 0.30-0.42 which is nonspecific     with 2 negative cardiac CK and CK-MBs. 4. Hypertension, controlled. 5. Diabetes mellitus type 2, controlled on a very low dose of     metformin.  I suspect she probably has hyperglycemia than true     diabetes mellitus. 6. Obesity.  RECOMMENDATIONS:  Overall, I would consider that she can be taken up for surgery with utmost a moderate risk for perioperative cardiovascular events.  I am going to obtain a repeat CK, CK-MB, and troponins.  If they remained flat and the CK and CK-MBs are negative for myocardial injury, she can be taken up for the surgery.  However, if they do raise and she has evidence of acute coronary syndrome, obviously the surgery needs to be cancelled and cardiac evaluation should be considered at that point.  I have discussed these findings with Dr. Ninfa Linden with General Surgery and he is appreciative of the consultation.  I will see her first thing in the morning to follow up again on the cardiac markers.  She has essentially remained asymptomatic prior to the presentation with no chest pain, no shortness of breath, physical examination, and no congestive heart failure.  Her electrolytes needed to be corrected prior to surgery.  I would also recommend that we stop calcium channel blocker and place her on a beta- blocker.  I will start her on a beta-blocker therapy tonight for cardiovascular protection.  I will also place her on 81 mg of aspirin tonight.  Thanks for the consultation.     Laverda Page, MD     JRG/MEDQ  D:  09/23/2010  T:  09/24/2010  Job:  [id number]  cc:   Coralie Keens, M.D.  Electronically Signed by [name] MD on 10/17/2010 01:53:49 PM

## 2010-11-06 ENCOUNTER — Ambulatory Visit (INDEPENDENT_AMBULATORY_CARE_PROVIDER_SITE_OTHER): Payer: Medicare Other | Admitting: General Surgery

## 2010-11-06 ENCOUNTER — Encounter (INDEPENDENT_AMBULATORY_CARE_PROVIDER_SITE_OTHER): Payer: Self-pay | Admitting: General Surgery

## 2010-11-06 DIAGNOSIS — K819 Cholecystitis, unspecified: Secondary | ICD-10-CM | POA: Insufficient documentation

## 2010-11-06 NOTE — Patient Instructions (Signed)
Go to radiology appt on 8/20 to evaluate gallbladder.

## 2010-11-06 NOTE — Progress Notes (Signed)
[name] is a 77 y.o. female.    Chief Complaint  Patient presents with  . Follow-up    Recheck GB drain    HPI HPI 67 year old obese Caucasian female who I initially met in the hospital in late June 2012 with abdominal pain in the right upper abdomen. She was found to have acute acalculous cholecystitis. There were no stones demonstrated on ultrasound or CT imaging. She was initially planned to have a laparoscopic cholecystectomy however she developed chest pressure which prompted an EKG as well as cardiac enzymes. She was found to have a non-ST elevated myocardial infarction. Dr. Einar Gip performed a cardiac catheterization in place several drug-eluting stents into her left anterior descending, proximal descending left anterior and diagonal branch of her LAD. Because of the myocardial infarction she underwent placement of a percutaneous cholecystostomy tube on June 30.  She was discharged from the hospital on July 4. Her dizziness has resolved after her cardiologist adjusted several of her medications. She has been doing a lot better since I last saw her several weeks ago. PT, OT, and home health nursing are working her. She denies any nausea, vomiting, or abdominal pain. She denies any fevers or chills. She denies any jaundice, diarrhea or constipation. She states that her appetite is fairly normal. Currently her son is flushing her drain once  a day. She is draining about 4 ounces per day of bile from her cholecystostomy tube. She also complains of some shortness of breath on exertion.   Past Medical History  Diagnosis Date  . Diabetes mellitus   . Hypertension   . GERD (gastroesophageal reflux disease)   . Dyslipidemia   . Coronary artery disease   . Hypokalemia   . Acalculous cholecystitis   . Myocardial infarction   . Dizzy   . Fatigue     Past Surgical History  Procedure Date  . Parathyroidectomy     tumor excision  . Abdominal hysterectomy   . Shoulder surgery    right  . Cataract extraction     2  . Coronary angioplasty with stent placement 09/24/10    drug eluting stents    Family History  Problem Relation Age of Onset  . Heart disease Father   . Heart disease Brother     Social History History  Substance Use Topics  . Smoking status: Never Smoker   . Smokeless tobacco: Not on file  . Alcohol Use: Yes    Allergies  Allergen Reactions  . Sulfur     Current Outpatient Prescriptions  Medication Sig Dispense Refill  . aspirin 81 MG tablet Take 81 mg by mouth daily.        . brimonidine-timolol (COMBIGAN) 0.2-0.5 % ophthalmic solution Place 1 drop into the left eye every 12 (twelve) hours.        Marland Kitchen esomeprazole (NEXIUM) 40 MG capsule Take 40 mg by mouth daily before breakfast.        . HYDROcodone-acetaminophen (NORCO) 5-325 MG per tablet Take 1 tablet by mouth every 6 (six) hours as needed.        . metFORMIN (GLUMETZA) 500 MG (MOD) 24 hr tablet Take 500 mg by mouth daily with breakfast. Half tablet       . methocarbamol (ROBAXIN) 500 MG tablet Take 500 mg by mouth as needed.        . metoprolol succinate (TOPROL-XL) 25 MG 24 hr tablet Take 25 mg by mouth 1 day or 1 dose. Metoprolol Succinate ER long lasting for  24hr      . Polyethyl Glycol-Propyl Glycol (SYSTANE ULTRA OP) Apply to eye.        . rosuvastatin (CRESTOR) 5 MG tablet Take 5 mg by mouth daily.        . Ticagrelor (BRILINTA) 90 MG TABS tablet Take 90 mg by mouth 2 (two) times daily.        . valsartan (DIOVAN) 320 MG tablet Take 160 mg by mouth 1 day or 1 dose.       . chlorthalidone (HYGROTON) 25 MG tablet Take 25 mg by mouth daily.          Review of Systems Review of Systems  Constitutional: Negative for fever, chills and weight loss.  HENT: Negative.   Eyes: Negative.   Respiratory:negative for shortness of breath.   Cardiovascular: Negative for chest pain.        +DOE  Gastrointestinal: Negative.   Genitourinary: Negative.   Skin: Negative.   Neurological:  Negative.   Psychiatric/Behavioral: Negative.     Physical Exam Physical Exam  Vitals reviewed. Constitutional: She is oriented to person, place, and time. She appears well-developed and well-nourished.  HENT:  Head: Normocephalic and atraumatic.  Eyes: Pupils are equal, round, and reactive to light. No scleral icterus.  Neck: Normal range of motion. No tracheal deviation present.  Cardiovascular: Normal rate, regular rhythm and normal heart sounds.   Respiratory: Effort normal and breath sounds normal. No respiratory distress. She has no wheezes.  GI: Soft. Bowel sounds are normal. She exhibits no distension. There is no tenderness. There is no guarding.       Percutaneous drain Rt lat abdomina wall- drain site- no infection.  Some green bile in drain bag  Musculoskeletal: Normal range of motion.  Neurological: She is alert and oriented to person, place, and time.  Skin: Skin is warm and dry. She is not diaphoretic. No pallor.  Psychiatric: She has a normal mood and affect. Her behavior is normal. Judgment normal.      Data reviewed: I reviewed her history and physical from June 27 as well as her discharge summary from July 4. I Reviewed my note from last visit  Assessment/Plan  77 year old Caucasian female with recent acalculous acute cholecystitis status post percutaneous cholecystostomy tube placement and a non-ST elevated myocardial infarction status post cardiac stent placement.  She seems to be doing quite well with respect to her gallbladder. She is scheduled to get a cholangiogram through the cholecystostomy tube on August 20. This is to evaluate bile duct patency. If her bile ducts are normal I recommend removal of the cholecystostomy tube. My preference is to leave her gallbladder alone for now and not offer her a cholecystectomy for the time being. Since she did not have any evidence of gallstones on ultrasound or CT imaging I believe her chance of a repeat episode of  cholecystitis his low. Moreover we need to avoid a cholecystectomy for at least 6 months because of her recent myocardial infarction. I'll see her in 6 weeks.  Leighton Ruff. Redmond Pulling, MD  Gayland Curry 11/06/2010, 10:39 AM

## 2010-11-16 ENCOUNTER — Other Ambulatory Visit (INDEPENDENT_AMBULATORY_CARE_PROVIDER_SITE_OTHER): Payer: Self-pay | Admitting: General Surgery

## 2010-11-16 ENCOUNTER — Ambulatory Visit (HOSPITAL_COMMUNITY)
Admission: RE | Admit: 2010-11-16 | Discharge: 2010-11-16 | Disposition: A | Discharge status: Home / Self Care | Payer: Medicare Other | Source: Ambulatory Visit | Attending: General Surgery | Admitting: General Surgery

## 2010-11-16 DIAGNOSIS — Z438 Encounter for attention to other artificial openings: Secondary | ICD-10-CM | POA: Insufficient documentation

## 2010-11-16 DIAGNOSIS — K819 Cholecystitis, unspecified: Secondary | ICD-10-CM

## 2010-11-16 MED ORDER — IOHEXOL 300 MG/ML  SOLN
50.0000 mL | Freq: Once | INTRAMUSCULAR | Status: AC | PRN
  Administered 2010-11-16: 15 mL

## 2010-11-28 HISTORY — PX: BILIARY DRAINAGE: SHX1229

## 2010-12-02 ENCOUNTER — Encounter (HOSPITAL_COMMUNITY): Payer: Medicare Other

## 2010-12-04 ENCOUNTER — Inpatient Hospital Stay (HOSPITAL_COMMUNITY)
Admission: EM | Admit: 2010-12-04 | Discharge: 2010-12-18 | DRG: 444 | Disposition: A | Discharge status: Home Health | Payer: Medicare Other | Source: Ambulatory Visit | Attending: Internal Medicine | Admitting: Internal Medicine

## 2010-12-04 ENCOUNTER — Encounter (HOSPITAL_COMMUNITY): Payer: Self-pay | Admitting: Radiology

## 2010-12-04 ENCOUNTER — Emergency Department (HOSPITAL_COMMUNITY): Payer: Medicare Other

## 2010-12-04 ENCOUNTER — Encounter (HOSPITAL_COMMUNITY): Payer: Medicare Other

## 2010-12-04 DIAGNOSIS — E662 Morbid (severe) obesity with alveolar hypoventilation: Secondary | ICD-10-CM | POA: Diagnosis present

## 2010-12-04 DIAGNOSIS — E872 Acidosis, unspecified: Secondary | ICD-10-CM | POA: Diagnosis not present

## 2010-12-04 DIAGNOSIS — E785 Hyperlipidemia, unspecified: Secondary | ICD-10-CM | POA: Diagnosis present

## 2010-12-04 DIAGNOSIS — I469 Cardiac arrest, cause unspecified: Secondary | ICD-10-CM | POA: Diagnosis not present

## 2010-12-04 DIAGNOSIS — I10 Essential (primary) hypertension: Secondary | ICD-10-CM | POA: Diagnosis present

## 2010-12-04 DIAGNOSIS — G929 Unspecified toxic encephalopathy: Secondary | ICD-10-CM | POA: Diagnosis not present

## 2010-12-04 DIAGNOSIS — E119 Type 2 diabetes mellitus without complications: Secondary | ICD-10-CM | POA: Diagnosis present

## 2010-12-04 DIAGNOSIS — E860 Dehydration: Secondary | ICD-10-CM | POA: Diagnosis not present

## 2010-12-04 DIAGNOSIS — J9600 Acute respiratory failure, unspecified whether with hypoxia or hypercapnia: Secondary | ICD-10-CM | POA: Diagnosis not present

## 2010-12-04 DIAGNOSIS — R1310 Dysphagia, unspecified: Secondary | ICD-10-CM | POA: Diagnosis present

## 2010-12-04 DIAGNOSIS — E876 Hypokalemia: Secondary | ICD-10-CM | POA: Diagnosis present

## 2010-12-04 DIAGNOSIS — K812 Acute cholecystitis with chronic cholecystitis: Principal | ICD-10-CM | POA: Diagnosis present

## 2010-12-04 DIAGNOSIS — N2889 Other specified disorders of kidney and ureter: Secondary | ICD-10-CM | POA: Diagnosis present

## 2010-12-04 DIAGNOSIS — G92 Toxic encephalopathy: Secondary | ICD-10-CM | POA: Diagnosis not present

## 2010-12-04 DIAGNOSIS — I252 Old myocardial infarction: Secondary | ICD-10-CM

## 2010-12-04 DIAGNOSIS — T448X5A Adverse effect of centrally-acting and adrenergic-neuron-blocking agents, initial encounter: Secondary | ICD-10-CM | POA: Diagnosis not present

## 2010-12-04 DIAGNOSIS — K219 Gastro-esophageal reflux disease without esophagitis: Secondary | ICD-10-CM | POA: Diagnosis present

## 2010-12-04 DIAGNOSIS — D649 Anemia, unspecified: Secondary | ICD-10-CM | POA: Diagnosis present

## 2010-12-04 DIAGNOSIS — Z79899 Other long term (current) drug therapy: Secondary | ICD-10-CM

## 2010-12-04 DIAGNOSIS — Z23 Encounter for immunization: Secondary | ICD-10-CM

## 2010-12-04 DIAGNOSIS — I498 Other specified cardiac arrhythmias: Secondary | ICD-10-CM | POA: Diagnosis not present

## 2010-12-04 DIAGNOSIS — F0390 Unspecified dementia without behavioral disturbance: Secondary | ICD-10-CM | POA: Diagnosis present

## 2010-12-04 DIAGNOSIS — Z9861 Coronary angioplasty status: Secondary | ICD-10-CM

## 2010-12-04 DIAGNOSIS — I959 Hypotension, unspecified: Secondary | ICD-10-CM | POA: Diagnosis not present

## 2010-12-04 DIAGNOSIS — I2789 Other specified pulmonary heart diseases: Secondary | ICD-10-CM | POA: Diagnosis present

## 2010-12-04 DIAGNOSIS — N179 Acute kidney failure, unspecified: Secondary | ICD-10-CM | POA: Diagnosis not present

## 2010-12-04 DIAGNOSIS — F05 Delirium due to known physiological condition: Secondary | ICD-10-CM | POA: Diagnosis not present

## 2010-12-04 DIAGNOSIS — Z7982 Long term (current) use of aspirin: Secondary | ICD-10-CM

## 2010-12-04 DIAGNOSIS — G4733 Obstructive sleep apnea (adult) (pediatric): Secondary | ICD-10-CM | POA: Diagnosis present

## 2010-12-04 DIAGNOSIS — F341 Dysthymic disorder: Secondary | ICD-10-CM | POA: Diagnosis present

## 2010-12-04 DIAGNOSIS — Z6838 Body mass index (BMI) 38.0-38.9, adult: Secondary | ICD-10-CM

## 2010-12-04 DIAGNOSIS — A498 Other bacterial infections of unspecified site: Secondary | ICD-10-CM | POA: Diagnosis present

## 2010-12-04 LAB — URINALYSIS, ROUTINE W REFLEX MICROSCOPIC
Glucose, UA: NEGATIVE mg/dL
Ketones, ur: 40 mg/dL — AB
Leukocytes, UA: NEGATIVE
Protein, ur: 30 mg/dL — AB
Urobilinogen, UA: 1 mg/dL (ref 0.0–1.0)

## 2010-12-04 LAB — DIFFERENTIAL
Eosinophils Absolute: 0 10*3/uL (ref 0.0–0.7)
Lymphocytes Relative: 10 % — ABNORMAL LOW (ref 12–46)
Lymphs Abs: 1.1 10*3/uL (ref 0.7–4.0)
Monocytes Relative: 6 % (ref 3–12)
Neutrophils Relative %: 84 % — ABNORMAL HIGH (ref 43–77)

## 2010-12-04 LAB — COMPREHENSIVE METABOLIC PANEL
AST: 16 U/L (ref 0–37)
BUN: 14 mg/dL (ref 6–23)
CO2: 24 mEq/L (ref 19–32)
Calcium: 8.9 mg/dL (ref 8.4–10.5)
Chloride: 105 mEq/L (ref 96–112)
Creatinine, Ser: 0.87 mg/dL (ref 0.50–1.10)
GFR calc Af Amer: >60 mL/min (ref >60)
GFR calc non Af Amer: >60 mL/min (ref >60)
Glucose, Bld: 139 mg/dL — ABNORMAL HIGH (ref 70–99)
Total Bilirubin: 0.5 mg/dL (ref 0.3–1.2)

## 2010-12-04 LAB — CBC
HCT: 31 % — ABNORMAL LOW (ref 36.0–46.0)
Hemoglobin: 10.6 g/dL — ABNORMAL LOW (ref 12.0–15.0)
MCH: 31.6 pg (ref 26.0–34.0)
MCV: 92.5 fL (ref 78.0–100.0)
Platelets: 291 10*3/uL (ref 150–400)
RBC: 3.35 MIL/uL — ABNORMAL LOW (ref 3.87–5.11)
WBC: 11.1 10*3/uL — ABNORMAL HIGH (ref 4.0–10.5)

## 2010-12-04 LAB — URINE MICROSCOPIC-ADD ON

## 2010-12-04 LAB — POCT I-STAT TROPONIN I: Troponin i, poc: 0.01 ng/mL (ref 0.00–0.08)

## 2010-12-04 LAB — LIPASE, BLOOD: Lipase: 35 U/L (ref 11–59)

## 2010-12-04 MED ORDER — IOHEXOL 300 MG/ML  SOLN
100.0000 mL | Freq: Once | INTRAMUSCULAR | Status: AC | PRN
  Administered 2010-12-04: 100 mL via INTRAVENOUS

## 2010-12-05 ENCOUNTER — Inpatient Hospital Stay (HOSPITAL_COMMUNITY): Payer: Medicare Other

## 2010-12-05 DIAGNOSIS — K811 Chronic cholecystitis: Secondary | ICD-10-CM

## 2010-12-05 LAB — CARDIAC PANEL(CRET KIN+CKTOT+MB+TROPI)
CK, MB: 2.8 ng/mL (ref 0.3–4.0)
Relative Index: INVALID (ref 0.0–2.5)
Total CK: 55 U/L (ref 7–177)
Total CK: 47 U/L (ref 7–177)
Troponin I: <0.3 ng/mL (ref <0.30)

## 2010-12-05 LAB — CK TOTAL AND CKMB (NOT AT ARMC)
CK, MB: 3.3 ng/mL (ref 0.3–4.0)
Relative Index: INVALID (ref 0.0–2.5)

## 2010-12-05 LAB — URINALYSIS, MICROSCOPIC ONLY
Hgb urine dipstick: NEGATIVE
Nitrite: NEGATIVE
Protein, ur: 30 mg/dL — AB
Urobilinogen, UA: 0.2 mg/dL (ref 0.0–1.0)

## 2010-12-05 LAB — GLUCOSE, CAPILLARY
Glucose-Capillary: 159 mg/dL — ABNORMAL HIGH (ref 70–99)
Glucose-Capillary: 177 mg/dL — ABNORMAL HIGH (ref 70–99)

## 2010-12-05 LAB — PROTIME-INR
INR: 1.06 (ref 0.00–1.49)
Prothrombin Time: 14 seconds (ref 11.6–15.2)

## 2010-12-06 ENCOUNTER — Inpatient Hospital Stay (HOSPITAL_COMMUNITY): Payer: Medicare Other

## 2010-12-06 LAB — CBC
HCT: 27.9 % — ABNORMAL LOW (ref 36.0–46.0)
Hemoglobin: 9 g/dL — ABNORMAL LOW (ref 12.0–15.0)
MCH: 30.7 pg (ref 26.0–34.0)
MCHC: 32.3 g/dL (ref 30.0–36.0)
MCV: 95.2 fL (ref 78.0–100.0)

## 2010-12-06 LAB — GLUCOSE, CAPILLARY: Glucose-Capillary: 126 mg/dL — ABNORMAL HIGH (ref 70–99)

## 2010-12-06 LAB — COMPREHENSIVE METABOLIC PANEL
ALT: 35 U/L (ref 0–35)
BUN: 21 mg/dL (ref 6–23)
CO2: 22 mEq/L (ref 19–32)
Calcium: 8.5 mg/dL (ref 8.4–10.5)
Creatinine, Ser: 1.43 mg/dL — ABNORMAL HIGH (ref 0.50–1.10)
GFR calc Af Amer: 43 mL/min — ABNORMAL LOW (ref >60)
GFR calc non Af Amer: 36 mL/min — ABNORMAL LOW (ref >60)
Glucose, Bld: 109 mg/dL — ABNORMAL HIGH (ref 70–99)

## 2010-12-06 LAB — MAGNESIUM: Magnesium: 1.9 mg/dL (ref 1.5–2.5)

## 2010-12-06 MED ORDER — IOHEXOL 300 MG/ML  SOLN
50.0000 mL | Freq: Once | INTRAMUSCULAR | Status: AC | PRN
  Administered 2010-12-06: 12 mL

## 2010-12-07 ENCOUNTER — Encounter (HOSPITAL_COMMUNITY): Payer: Medicare Other

## 2010-12-07 LAB — COMPREHENSIVE METABOLIC PANEL
ALT: 31 U/L (ref 0–35)
AST: 17 U/L (ref 0–37)
Alkaline Phosphatase: 99 U/L (ref 39–117)
CO2: 21 mEq/L (ref 19–32)
GFR calc Af Amer: 56 mL/min — ABNORMAL LOW (ref >60)
GFR calc non Af Amer: 47 mL/min — ABNORMAL LOW (ref >60)
Glucose, Bld: 76 mg/dL (ref 70–99)
Potassium: 4.1 mEq/L (ref 3.5–5.1)
Sodium: 144 mEq/L (ref 135–145)
Total Protein: 6 g/dL (ref 6.0–8.3)

## 2010-12-07 LAB — CBC
Hemoglobin: 9.1 g/dL — ABNORMAL LOW (ref 12.0–15.0)
Platelets: 240 10*3/uL (ref 150–400)
RBC: 2.94 MIL/uL — ABNORMAL LOW (ref 3.87–5.11)

## 2010-12-07 LAB — GLUCOSE, CAPILLARY
Glucose-Capillary: 80 mg/dL (ref 70–99)
Glucose-Capillary: 92 mg/dL (ref 70–99)

## 2010-12-08 ENCOUNTER — Inpatient Hospital Stay (HOSPITAL_COMMUNITY): Payer: Medicare Other

## 2010-12-08 ENCOUNTER — Encounter (HOSPITAL_COMMUNITY): Payer: Self-pay | Admitting: Radiology

## 2010-12-08 LAB — CBC
HCT: 24 % — ABNORMAL LOW (ref 36.0–46.0)
Hemoglobin: 7.7 g/dL — ABNORMAL LOW (ref 12.0–15.0)
MCH: 30.8 pg (ref 26.0–34.0)
MCHC: 32.1 g/dL (ref 30.0–36.0)
MCV: 96 fL (ref 78.0–100.0)
MCV: 95.4 fL (ref 78.0–100.0)
Platelets: 329 10*3/uL (ref 150–400)
RBC: 2.5 MIL/uL — ABNORMAL LOW (ref 3.87–5.11)
RBC: 2.85 MIL/uL — ABNORMAL LOW (ref 3.87–5.11)
RDW: 14.9 % (ref 11.5–15.5)
WBC: 11.1 10*3/uL — ABNORMAL HIGH (ref 4.0–10.5)

## 2010-12-08 LAB — BODY FLUID CULTURE

## 2010-12-08 LAB — BASIC METABOLIC PANEL
BUN: 15 mg/dL (ref 6–23)
CO2: 21 mEq/L (ref 19–32)
Calcium: 8.6 mg/dL (ref 8.4–10.5)
Creatinine, Ser: 1.08 mg/dL (ref 0.50–1.10)
GFR calc non Af Amer: 50 mL/min — ABNORMAL LOW (ref >60)
Glucose, Bld: 104 mg/dL — ABNORMAL HIGH (ref 70–99)
Sodium: 145 mEq/L (ref 135–145)

## 2010-12-08 LAB — GLUCOSE, CAPILLARY
Glucose-Capillary: 90 mg/dL (ref 70–99)
Glucose-Capillary: 150 mg/dL — ABNORMAL HIGH (ref 70–99)

## 2010-12-09 ENCOUNTER — Encounter (HOSPITAL_COMMUNITY): Payer: Medicare Other

## 2010-12-09 LAB — GLUCOSE, CAPILLARY
Glucose-Capillary: 113 mg/dL — ABNORMAL HIGH (ref 70–99)
Glucose-Capillary: 87 mg/dL (ref 70–99)

## 2010-12-09 LAB — BLOOD GAS, ARTERIAL
Acid-base deficit: 5.8 mmol/L — ABNORMAL HIGH (ref 0.0–2.0)
Bicarbonate: 21.8 mEq/L (ref 20.0–24.0)
Drawn by: 24486
O2 Content: 3 L/min
TCO2: 23.9 mmol/L (ref 0–100)
pCO2 arterial: 65.6 mmHg (ref 35.0–45.0)
pO2, Arterial: 75.7 mmHg — ABNORMAL LOW (ref 80.0–100.0)

## 2010-12-09 LAB — COMPREHENSIVE METABOLIC PANEL
CO2: 21 mEq/L (ref 19–32)
Calcium: 8.6 mg/dL (ref 8.4–10.5)
Creatinine, Ser: 0.88 mg/dL (ref 0.50–1.10)
GFR calc Af Amer: >60 mL/min (ref >60)
GFR calc non Af Amer: >60 mL/min (ref >60)
Glucose, Bld: 111 mg/dL — ABNORMAL HIGH (ref 70–99)
Total Protein: 5.9 g/dL — ABNORMAL LOW (ref 6.0–8.3)

## 2010-12-09 LAB — CBC
Hemoglobin: 8.5 g/dL — ABNORMAL LOW (ref 12.0–15.0)
MCH: 30.7 pg (ref 26.0–34.0)
MCHC: 31.8 g/dL (ref 30.0–36.0)
MCV: 96.4 fL (ref 78.0–100.0)
RBC: 2.77 MIL/uL — ABNORMAL LOW (ref 3.87–5.11)

## 2010-12-09 LAB — MRSA PCR SCREENING: MRSA by PCR: NEGATIVE

## 2010-12-09 LAB — FOLATE RBC: RBC Folate: 232 ng/mL — ABNORMAL LOW (ref >=366)

## 2010-12-10 ENCOUNTER — Inpatient Hospital Stay (HOSPITAL_COMMUNITY): Payer: Medicare Other

## 2010-12-10 DIAGNOSIS — R0902 Hypoxemia: Secondary | ICD-10-CM

## 2010-12-10 LAB — CBC
HCT: 25.2 % — ABNORMAL LOW (ref 36.0–46.0)
Hemoglobin: 8.3 g/dL — ABNORMAL LOW (ref 12.0–15.0)
MCH: 31.1 pg (ref 26.0–34.0)
MCHC: 32.6 g/dL (ref 30.0–36.0)
MCHC: 32.9 g/dL (ref 30.0–36.0)
MCV: 95.2 fL (ref 78.0–100.0)
Platelets: 285 10*3/uL (ref 150–400)
RBC: 2.51 MIL/uL — ABNORMAL LOW (ref 3.87–5.11)
WBC: 10.5 10*3/uL (ref 4.0–10.5)

## 2010-12-10 LAB — POCT I-STAT, CHEM 8
BUN: 11 mg/dL (ref 6–23)
Creatinine, Ser: 0.8 mg/dL (ref 0.50–1.10)
Hemoglobin: 9.2 g/dL — ABNORMAL LOW (ref 12.0–15.0)
Potassium: 4.5 mEq/L (ref 3.5–5.1)
Sodium: 143 mEq/L (ref 135–145)

## 2010-12-10 LAB — BASIC METABOLIC PANEL
BUN: 10 mg/dL (ref 6–23)
CO2: 21 mEq/L (ref 19–32)
Calcium: 8.7 mg/dL (ref 8.4–10.5)
Chloride: 109 mEq/L (ref 96–112)
Creatinine, Ser: 0.89 mg/dL (ref 0.50–1.10)
Glucose, Bld: 113 mg/dL — ABNORMAL HIGH (ref 70–99)
Glucose, Bld: 180 mg/dL — ABNORMAL HIGH (ref 70–99)
Potassium: 3.7 mEq/L (ref 3.5–5.1)

## 2010-12-10 LAB — TSH: TSH: 0.973 u[IU]/mL (ref 0.350–4.500)

## 2010-12-10 LAB — CARDIAC PANEL(CRET KIN+CKTOT+MB+TROPI)
CK, MB: 3.7 ng/mL (ref 0.3–4.0)
Relative Index: INVALID (ref 0.0–2.5)
Total CK: 83 U/L (ref 7–177)

## 2010-12-10 LAB — URINALYSIS, MICROSCOPIC ONLY
Leukocytes, UA: NEGATIVE
Nitrite: NEGATIVE
Specific Gravity, Urine: 1.019 (ref 1.005–1.030)
pH: 6 (ref 5.0–8.0)

## 2010-12-10 LAB — BLOOD GAS, ARTERIAL
Bicarbonate: 22.4 mEq/L (ref 20.0–24.0)
TCO2: 23.9 mmol/L (ref 0–100)
pCO2 arterial: 48.2 mmHg — ABNORMAL HIGH (ref 35.0–45.0)
pH, Arterial: 7.289 — ABNORMAL LOW (ref 7.350–7.400)

## 2010-12-10 LAB — GLUCOSE, CAPILLARY
Glucose-Capillary: 108 mg/dL — ABNORMAL HIGH (ref 70–99)
Glucose-Capillary: 123 mg/dL — ABNORMAL HIGH (ref 70–99)
Glucose-Capillary: 127 mg/dL — ABNORMAL HIGH (ref 70–99)

## 2010-12-10 LAB — POCT I-STAT 3, ART BLOOD GAS (G3+)
Bicarbonate: 24.1 mEq/L — ABNORMAL HIGH (ref 20.0–24.0)
O2 Saturation: 99 %
TCO2: 26 mmol/L (ref 0–100)
pCO2 arterial: 50.8 mmHg — ABNORMAL HIGH (ref 35.0–45.0)
pCO2 arterial: 52.1 mmHg — ABNORMAL HIGH (ref 35.0–45.0)
pH, Arterial: 7.273 — ABNORMAL LOW (ref 7.350–7.400)
pO2, Arterial: 158 mmHg — ABNORMAL HIGH (ref 80.0–100.0)
pO2, Arterial: 109 mmHg — ABNORMAL HIGH (ref 80.0–100.0)

## 2010-12-10 LAB — VITAMIN B12: Vitamin B-12: 486 pg/mL (ref 211–911)

## 2010-12-10 LAB — FERRITIN: Ferritin: 560 ng/mL — ABNORMAL HIGH (ref 10–291)

## 2010-12-11 ENCOUNTER — Inpatient Hospital Stay (HOSPITAL_COMMUNITY): Payer: Medicare Other

## 2010-12-11 ENCOUNTER — Encounter (HOSPITAL_COMMUNITY): Payer: Medicare Other

## 2010-12-11 LAB — CBC
HCT: 20.4 % — ABNORMAL LOW (ref 36.0–46.0)
HCT: 20 % — ABNORMAL LOW (ref 36.0–46.0)
Hemoglobin: 10.8 g/dL — ABNORMAL LOW (ref 12.0–15.0)
Hemoglobin: 6.7 g/dL — CL (ref 12.0–15.0)
Hemoglobin: 6.8 g/dL — CL (ref 12.0–15.0)
MCH: 30.8 pg (ref 26.0–34.0)
MCH: 31.1 pg (ref 26.0–34.0)
MCH: 31.2 pg (ref 26.0–34.0)
MCHC: 33.3 g/dL (ref 30.0–36.0)
MCHC: 33.5 g/dL (ref 30.0–36.0)
MCV: 88.9 fL (ref 78.0–100.0)
MCV: 93 fL (ref 78.0–100.0)
MCV: 93.2 fL (ref 78.0–100.0)
Platelets: 259 10*3/uL (ref 150–400)
Platelets: 266 10*3/uL (ref 150–400)
Platelets: 336 10*3/uL (ref 150–400)
RBC: 2.15 MIL/uL — ABNORMAL LOW (ref 3.87–5.11)
RBC: 2.19 MIL/uL — ABNORMAL LOW (ref 3.87–5.11)
RBC: 3.51 MIL/uL — ABNORMAL LOW (ref 3.87–5.11)
RDW: 15 % (ref 11.5–15.5)
RDW: 15.1 % (ref 11.5–15.5)
RDW: 16.6 % — ABNORMAL HIGH (ref 11.5–15.5)
WBC: 5.8 10*3/uL (ref 4.0–10.5)
WBC: 5.5 10*3/uL (ref 4.0–10.5)

## 2010-12-11 LAB — POCT I-STAT 3, ART BLOOD GAS (G3+)
Acid-base deficit: 1 mmol/L (ref 0.0–2.0)
Bicarbonate: 22.1 mEq/L (ref 20.0–24.0)
O2 Saturation: 99 %
Patient temperature: 97.8
TCO2: 23 mmol/L (ref 0–100)
pCO2 arterial: 27.8 mmHg — ABNORMAL LOW (ref 35.0–45.0)
pH, Arterial: 7.507 — ABNORMAL HIGH (ref 7.350–7.400)
pO2, Arterial: 131 mmHg — ABNORMAL HIGH (ref 80.0–100.0)

## 2010-12-11 LAB — ANAEROBIC CULTURE

## 2010-12-11 LAB — GLUCOSE, CAPILLARY
Glucose-Capillary: 110 mg/dL — ABNORMAL HIGH (ref 70–99)
Glucose-Capillary: 84 mg/dL (ref 70–99)
Glucose-Capillary: 109 mg/dL — ABNORMAL HIGH (ref 70–99)
Glucose-Capillary: 120 mg/dL — ABNORMAL HIGH (ref 70–99)
Glucose-Capillary: 127 mg/dL — ABNORMAL HIGH (ref 70–99)

## 2010-12-11 LAB — BASIC METABOLIC PANEL
CO2: 24 mEq/L (ref 19–32)
Calcium: 8 mg/dL — ABNORMAL LOW (ref 8.4–10.5)
Chloride: 112 mEq/L (ref 96–112)
Glucose, Bld: 103 mg/dL — ABNORMAL HIGH (ref 70–99)
Sodium: 143 mEq/L (ref 135–145)

## 2010-12-11 LAB — CARDIAC PANEL(CRET KIN+CKTOT+MB+TROPI)
CK, MB: 3 ng/mL (ref 0.3–4.0)
CK, MB: 2.4 ng/mL (ref 0.3–4.0)
Relative Index: INVALID (ref 0.0–2.5)
Relative Index: INVALID (ref 0.0–2.5)
Total CK: 47 U/L (ref 7–177)
Total CK: 40 U/L (ref 7–177)
Troponin I: <0.3 ng/mL (ref <0.30)
Troponin I: <0.3 ng/mL (ref <0.30)

## 2010-12-11 LAB — CULTURE, BLOOD (ROUTINE X 2)

## 2010-12-11 LAB — ABO/RH: ABO/RH(D): A POS

## 2010-12-12 ENCOUNTER — Inpatient Hospital Stay (HOSPITAL_COMMUNITY): Payer: Medicare Other

## 2010-12-12 DIAGNOSIS — I498 Other specified cardiac arrhythmias: Secondary | ICD-10-CM

## 2010-12-12 DIAGNOSIS — R092 Respiratory arrest: Secondary | ICD-10-CM

## 2010-12-12 LAB — BASIC METABOLIC PANEL
BUN: 10 mg/dL (ref 6–23)
BUN: 11 mg/dL (ref 6–23)
CO2: 36 mEq/L — ABNORMAL HIGH (ref 19–32)
Calcium: 8.5 mg/dL (ref 8.4–10.5)
Calcium: 8.1 mg/dL — ABNORMAL LOW (ref 8.4–10.5)
Chloride: 98 mEq/L (ref 96–112)
Creatinine, Ser: 0.9 mg/dL (ref 0.50–1.10)
Creatinine, Ser: 0.97 mg/dL (ref 0.50–1.10)
GFR calc Af Amer: >60 mL/min (ref >60)
GFR calc non Af Amer: 56 mL/min — ABNORMAL LOW (ref >60)

## 2010-12-12 LAB — CROSSMATCH
ABO/RH(D): A POS
Antibody Screen: NEGATIVE
Unit division: 0
Unit division: 0

## 2010-12-12 LAB — GLUCOSE, CAPILLARY
Glucose-Capillary: 105 mg/dL — ABNORMAL HIGH (ref 70–99)
Glucose-Capillary: 105 mg/dL — ABNORMAL HIGH (ref 70–99)

## 2010-12-12 LAB — CBC
HCT: 33.3 % — ABNORMAL LOW (ref 36.0–46.0)
MCH: 30.6 pg (ref 26.0–34.0)
MCV: 89.5 fL (ref 78.0–100.0)
RBC: 3.72 MIL/uL — ABNORMAL LOW (ref 3.87–5.11)
RDW: 16.4 % — ABNORMAL HIGH (ref 11.5–15.5)
WBC: 7.6 10*3/uL (ref 4.0–10.5)

## 2010-12-13 ENCOUNTER — Inpatient Hospital Stay (HOSPITAL_COMMUNITY): Payer: Medicare Other

## 2010-12-13 DIAGNOSIS — I498 Other specified cardiac arrhythmias: Secondary | ICD-10-CM

## 2010-12-13 DIAGNOSIS — R0902 Hypoxemia: Secondary | ICD-10-CM

## 2010-12-13 DIAGNOSIS — R092 Respiratory arrest: Secondary | ICD-10-CM

## 2010-12-13 LAB — BASIC METABOLIC PANEL WITH GFR
BUN: 15 mg/dL (ref 6–23)
CO2: 33 meq/L — ABNORMAL HIGH (ref 19–32)
Calcium: 8 mg/dL — ABNORMAL LOW (ref 8.4–10.5)
Chloride: 99 meq/L (ref 96–112)
Creatinine, Ser: 0.99 mg/dL (ref 0.50–1.10)
GFR calc Af Amer: >60 mL/min
GFR calc non Af Amer: 55 mL/min — ABNORMAL LOW
Glucose, Bld: 101 mg/dL — ABNORMAL HIGH (ref 70–99)
Potassium: 4 meq/L (ref 3.5–5.1)
Sodium: 141 meq/L (ref 135–145)

## 2010-12-13 LAB — CBC
HCT: 35.4 % — ABNORMAL LOW (ref 36.0–46.0)
Hemoglobin: 11.8 g/dL — ABNORMAL LOW (ref 12.0–15.0)
MCH: 30.4 pg (ref 26.0–34.0)
MCHC: 33.3 g/dL (ref 30.0–36.0)
MCV: 91.2 fL (ref 78.0–100.0)
Platelets: 364 K/uL (ref 150–400)
RBC: 3.88 MIL/uL (ref 3.87–5.11)
RDW: 15.9 % — ABNORMAL HIGH (ref 11.5–15.5)
WBC: 7.1 K/uL (ref 4.0–10.5)

## 2010-12-13 LAB — GLUCOSE, CAPILLARY: Glucose-Capillary: 92 mg/dL (ref 70–99)

## 2010-12-13 LAB — OCCULT BLOOD X 1 CARD TO LAB, STOOL
Fecal Occult Bld: POSITIVE
Fecal Occult Bld: POSITIVE

## 2010-12-14 ENCOUNTER — Inpatient Hospital Stay (HOSPITAL_COMMUNITY): Payer: Medicare Other

## 2010-12-14 ENCOUNTER — Encounter (HOSPITAL_COMMUNITY): Payer: Medicare Other

## 2010-12-14 LAB — URINE CULTURE
Colony Count: 15000
Culture  Setup Time: 201209131448

## 2010-12-14 LAB — BASIC METABOLIC PANEL
Chloride: 96 mEq/L (ref 96–112)
GFR calc Af Amer: >60 mL/min (ref >60)
Potassium: 3.2 mEq/L — ABNORMAL LOW (ref 3.5–5.1)
Sodium: 140 mEq/L (ref 135–145)

## 2010-12-14 LAB — CBC
HCT: 38.4 % (ref 36.0–46.0)
Hemoglobin: 12.5 g/dL (ref 12.0–15.0)
MCHC: 32.6 g/dL (ref 30.0–36.0)
MCV: 91.4 fL (ref 78.0–100.0)

## 2010-12-15 ENCOUNTER — Other Ambulatory Visit (INDEPENDENT_AMBULATORY_CARE_PROVIDER_SITE_OTHER): Payer: Self-pay | Admitting: General Surgery

## 2010-12-15 LAB — BASIC METABOLIC PANEL
CO2: 31 mEq/L (ref 19–32)
Chloride: 98 mEq/L (ref 96–112)
GFR calc Af Amer: 43 mL/min — ABNORMAL LOW (ref >60)
Potassium: 3.8 mEq/L (ref 3.5–5.1)
Sodium: 141 mEq/L (ref 135–145)

## 2010-12-15 LAB — MAGNESIUM: Magnesium: 1.8 mg/dL (ref 1.5–2.5)

## 2010-12-15 LAB — GLUCOSE, CAPILLARY
Glucose-Capillary: 107 mg/dL — ABNORMAL HIGH (ref 70–99)
Glucose-Capillary: 95 mg/dL (ref 70–99)
Glucose-Capillary: 124 mg/dL — ABNORMAL HIGH (ref 70–99)

## 2010-12-16 ENCOUNTER — Encounter (HOSPITAL_COMMUNITY): Payer: Medicare Other

## 2010-12-16 LAB — BASIC METABOLIC PANEL
BUN: 34 mg/dL — ABNORMAL HIGH (ref 6–23)
Calcium: 7.9 mg/dL — ABNORMAL LOW (ref 8.4–10.5)
Creatinine, Ser: 1.94 mg/dL — ABNORMAL HIGH (ref 0.50–1.10)
GFR calc Af Amer: 31 mL/min — ABNORMAL LOW (ref >60)

## 2010-12-16 LAB — GLUCOSE, CAPILLARY: Glucose-Capillary: 108 mg/dL — ABNORMAL HIGH (ref 70–99)

## 2010-12-16 LAB — MAGNESIUM: Magnesium: 1.7 mg/dL (ref 1.5–2.5)

## 2010-12-17 LAB — GLUCOSE, CAPILLARY
Glucose-Capillary: 99 mg/dL (ref 70–99)
Glucose-Capillary: 94 mg/dL (ref 70–99)

## 2010-12-18 ENCOUNTER — Encounter (INDEPENDENT_AMBULATORY_CARE_PROVIDER_SITE_OTHER): Payer: Medicare Other | Admitting: General Surgery

## 2010-12-18 ENCOUNTER — Encounter (HOSPITAL_COMMUNITY): Payer: Medicare Other

## 2010-12-18 LAB — GLUCOSE, CAPILLARY: Glucose-Capillary: 111 mg/dL — ABNORMAL HIGH (ref 70–99)

## 2010-12-21 ENCOUNTER — Encounter (HOSPITAL_COMMUNITY): Payer: Medicare Other

## 2010-12-23 ENCOUNTER — Encounter (HOSPITAL_COMMUNITY): Payer: Medicare Other

## 2010-12-24 NOTE — Progress Notes (Addendum)
[name], HASKIN NO.:  [id number]  MEDICAL RECORD NO.:  [id number]  LOCATION:  2011                         FACILITY:  [place] [facility]  PHYSICIAN:  Cherene Altes, M.D.DATE OF BIRTH:  [birth year]                                PROGRESS NOTE   ADMITTING PHYSICIAN: Dr. Marin Comment with [facility].  PRIMARY CARE PHYSICIAN: Dr. Maudie Mercury.  PRIMARY SURGEON: Dr. Greer Pickerel.  PRIMARY CARDIOLOGIST: Dr. Einar Gip.  CONSULTANTS AT THIS ADMISSION: [facility], Pulmonary Critical Care Medicine Dr. Kara Mead.  CHIEF COMPLAINT AND REASON FOR ADMISSION: [name] is a 77 year old female patient, post non-ST-segment elevated MI in June 2012, status post PTCA and drug-eluding stent placement x3.  She is on Brilinta prior to admission.  She has a history of acalculous cholecystitis with prior percutaneous cholecystostomy tube placement, September 26, 2010.  Please note that this tube has been discontinued prior to the patient presenting for this admission.  She presented back to the emergency department on the date of admission with lower chest pain, epigastric pain, nausea, and pain radiating around her back and right upper quadrant.  A CT was done that demonstrated fullness of the gallbladder and possible bile leak and the patient with recent cholecystostomy tube which has now been removed.  No stones were seen in the common bile duct.  There was apparently a lesion also seen on the liver with radiologist recommend and followup.  Her initial white count was elevated.  Her potassium was slightly low.  Hemoglobin was stable and creatinine was within normal limits.  LFTs were within normal limits.  The hospitalist team was asked to evaluate the patient for admission.  PHYSICAL EXAMINATION AT ADMISSION: VITAL SIGNS:  Temperature 98.2, BP 140/70, pulse 90, respirations 16. GENERAL APPEARANCE:  Alert and oriented patient, currently not experiencing any pain after  receiving medications in the ER, overall appearance was nonicteric. ABDOMEN:  Overall exam was not of normal, except for tenderness over the epigastric area and right upper quadrant region of the abdomen.  No palpable masses.  No abdominal distention.  Bowel sounds were present.  LABORATORY AT ADMISSION: White count 11,100, hemoglobin 10.6, hematocrit 31, platelets 291,000. Sodium 141, potassium 3.2, CO2 24, glucose 139, BUN 14, creatinine 0.87. LFTs were within normal limits.  Serum lipase was 35.  Urinalysis was slightly abnormal, was cloudy appearance, small amount of protein at 30, nitrite was negative, leukocytes were negative, rare bacteria, and rare yeast.  Initial cardiac isoenzymes showed no elevation.  ADMITTING DIAGNOSTICS: Acute abdominal series showed linear atelectasis in the left lung base versus scarring, no obstruction or free air, acute air-fluid levels in the right lower quadrant, possible mild ileus.  CT of the abdomen and pelvis with contrast shows a fullness of the gallbladder and common bile duct with gallbladder wall thickening and fluid in the perihepatic region that may represent ongoing inflammation or possibly a small gallbladder leak and this patient has had prior cholecystotomy.  There were also liver lesions of indeterminate etiology.  Abscess within the right lobe of the liver could not be excluded on these findings.  PAST MEDICAL HISTORY: 1. Acalculous cholecystitis with prior percutaneous cholecystostomy     tube June 2012,  now discontinued. 2. Known hepatic steatosis. 3. Hypertension. 4. Non-ST-segment elevated MI, post three drug-eluding stent June     2012. 5. Hypertension. 6. Dyslipidemia. 7. Diabetes II, on metformin. 8. Prior parathyroid tumor resection. 9. GERD. 10.Anxiety and depressive disorder. 11.History of prior transient hypokalemia. 12.Prior dysphagia.  ADMITTING DIAGNOSES: 1. Atypical chest pain in patient with known CAD and  recent coronary     stents, normal EKG at presentation. 2. Known acalculous cholecystitis with recurrent right upper quadrant     pain and nausea with distended gallbladder on CT scan. 3. Hypertension, currently moderately controlled. 4. GERD.  HOSPITAL COURSE: 1. Acalculous cholecystitis with associated atypical chest pain.  The     patient presented with atypical chest and epigastric pain.  Due to     her history of recent non-STEMI, it was considered that this may     have a cardiac ischemic etiology.  Subsequent cardiac isoenzymes     and EKG showed no evidence of ischemia.  It was felt that the     patient's symptomatology was related to her gallbladder.  [facility] was contacted that she was seen in consultation     and she subsequently underwent placement of a percutaneous     cholecystostomy tube on September 9 and this remains in place.     Cultures from the bilious drainage from this tube are sensitive,     showed pansensitive E. coli.  The patient currently remains on     Cipro and Flagyl.  I defer to [facility] if wish to     narrowed to Cipro only.  Per the PA note from December 16, 2010,     recommend an additional 2 weeks more of antibiotic therapy after     discharge.  She is also to follow up Dr. Redmond Pulling in 2-3 weeks after     discharge.  Anticipate the patient was discharged home with     cholecystostomy tube in place. 2. Acute hypercarbic respiratory failure, requiring mechanical     ventilation.  After admission, the patient did develop acute     hypercarbic respiratory failure.  This felt to be multifactorial     due to undiagnosed obstructive sleep apnea as well as obesity     hypoventilation syndrome as well as concomitant use of Dilaudid in     a narcotic naive patient.  The patient's care was previously seen     by Pulmonary Critical Care Medicine due to need for mechanical     ventilation, but once hypercarbia was treated and  narcotics were     reversed, the patient rapidly improved and was rapidly extubated.     Currently, she remained stable during the daytime hours of oxygen     without any desaturations or apneic and she is alert. 3. Possible obstructive sleep apnea and obesity hypoventilation     syndrome and the patient with severe pulmonary hypertension.  As     previously noted the patient had symptomatic hypercarbic     respiratory failure, suspected to be related to obesity     hypoventilation syndrome and sleep apnea.  A 2-D echocardiogram was     obtained this admission on September 8 that showed preserved LV     function with an EF of 65%-70% without any regional wall motion     abnormalities.  There is mild dilatation of the left atrium, but     more concerning with severe pulmonary  hypertension with peak     pressure of 72 mmHg.  The patient is currently on Norvasc.  It is     felt that she may again have the sleep apnea issues.  She will need     a polysomnogram study after discharge.  Use of CPAP mask has been     complicated by the fact that the patient is claustrophobic.  She is     only tolerated the mask with use of Xanax, but uncertain if we wish     to continue this kind of medication on chronic basis after     discharge.  As of today December 16, 2010, we will trial the     patient off of CPAP.  Over the past 24 hours, she has done well     without any desaturations on room air alone.  If she needs a port,     we will initially trial nasal cannula oxygen for sat less than 92%     and for sats persist being low.  She will need to attempt CPAP for     at least 4-6 hours each night. 4. Toxic metabolic encephalopathy and acute delirium.  Again, I     suspected be primarily due to hypercarbia and concomitant use of     Dilaudid in a narcotic naive patient, this has resolved. 5. Bradycardia.  This was a transient issue.  Metoprolol dose has been     adjusted. 6. Recent MI with stent x3,  June x3, on Brilinta.  We will continue     her beta-blocker, aspirin, and Brilinta.  The patient will be     unable to proceed with operative intervention regarding her     cholecystitis until it is okay with Cardiology for the patient to     be off of [place].  This is not an inpatient issue at this point.     Resolution of this after the patient is discharge. 7. Hypertension.  The patient's blood pressure is mildly controlled on     metoprolol, Norvasc, and Benicar. 8. Diabetes II.  The patient is currently moderately controlled on     sliding scale insulin.  She was on metformin preadmission.  DISPOSITION: At the present time, the patient is appropriate for transfer to a telemetry bed.     Manito Lissa Merlin, N.P.   ______________________________ Cherene Altes, M.D.    ALE/MEDQ  D:  12/16/2010  T:  12/16/2010  Job:  [id number]  Electronically Signed by Erin Hearing N.P. on 12/18/2010 03:52:37 PM Electronically Signed by Joette Catching M.D. on 12/24/2010 09:52:55 AM Electronically Signed by Joette Catching M.D. on 12/24/2010 09:54:11 AM

## 2010-12-25 ENCOUNTER — Encounter (HOSPITAL_COMMUNITY): Payer: Medicare Other

## 2010-12-28 ENCOUNTER — Encounter (HOSPITAL_COMMUNITY): Payer: Medicare Other

## 2010-12-30 ENCOUNTER — Encounter (HOSPITAL_COMMUNITY): Payer: Medicare Other

## 2010-12-31 NOTE — Discharge Summary (Signed)
  [name], Roberts NO.:  [id number]  MEDICAL RECORD NO.:  [id number]  LOCATION:  2011                         FACILITY:  [facility]  PHYSICIAN:  Edythe Lynn, M.D.       DATE OF BIRTH:  July 21, 1933  DATE OF ADMISSION:  12/04/2010 DATE OF DISCHARGE:  12/18/2010                              DISCHARGE SUMMARY   PRIMARY CARE PHYSICIAN:  Dr. Jani Gravel.  DISCHARGE DIAGNOSES: 1. Acalculous cholecystitis with percutaneous cholecystostomy tube     placement. 2. Hypertension. 3. Non-ST elevation myocardial infarction status post stenting in June     2012. 4. Hyperlipidemia. 5. Diabetes mellitus type 2. 6. Gastroesophageal reflux disease. 7. Anxiety and depression. 8. Hepatic steatosis.  DISCHARGE MEDICATIONS: 1. Xanax 0.25 mg by mouth at bedtime as needed for sleep. 2. Aspirin 81 mg daily. 3. Ciprofloxacin 500 mg by mouth twice 6-10 days. 4. Combigan ophthalmic solution to the left eye 1 drop twice a day. 5. Crestor 5 mg daily. 6. Diovan 320 mg half tablet daily. 7. Metformin 250 mg daily. 8. Methocarbamol 500 mg by mouth 1-2 tablets every 6 hours as needed     for pain. 9. Metoprolol 12.5 mg twice a day. 10.Nexium 40 mg a day. 11.Systane both eyes 1 drop daily as needed. 12.Brilinta 90 mg by mouth twice a day.  CONDITION ON DISCHARGE:  [name] is discharged in good condition, alert, oriented, no acute distress.  Vital signs stable, afebrile.  The patient will follow up with Dr. Jani Gravel, primary care physician, Dr. Greer Pickerel, [facility].  She will also follow up with Dr. [name] from Cardiology.  For complete list of procedures as well as consultations and entire hospital course, refer to dictated progress note done by Dr. Malen Gauze, December 16, 2010.  On September 20th and 21st, 2012, Ms. [name] was found to be hemodynamically stable, alert, oriented, no acute distress.  She was continued on her home medications  including Brilinta for her recent cardiac stents.  The plan is to treat Ms. [name] with ciprofloxacin for her acalculous cholecystitis.  She had the cholecystostomy tube placed back on December 06, 2010, by [facility] and she will continue flushing and drainage of that as before.  The culture results from December 06, 2010, from the fluid extracted when the new cholecystostomy tube was placed, grew Escherichia coli sensitive to ciprofloxacin.  Otherwise, the patient will also be following up with Dr. Jani Gravel for conservation of an outpatient sleep study.     Edythe Lynn, M.D.     SL/MEDQ  D:  12/18/2010  T:  12/18/2010  Job:  [id number]  cc:   Arlyss Repress, MD Leighton Ruff. Redmond Pulling, MD Laverda Page, MD  Electronically Signed by Edythe Lynn M.D. on 12/31/2010 07:37:35 AM

## 2011-01-01 ENCOUNTER — Encounter (HOSPITAL_COMMUNITY): Payer: Medicare Other

## 2011-01-04 ENCOUNTER — Encounter (HOSPITAL_COMMUNITY): Payer: Medicare Other

## 2011-01-06 ENCOUNTER — Encounter (HOSPITAL_COMMUNITY): Payer: Medicare Other

## 2011-01-06 ENCOUNTER — Encounter (HOSPITAL_BASED_OUTPATIENT_CLINIC_OR_DEPARTMENT_OTHER): Payer: Medicare Other

## 2011-01-08 ENCOUNTER — Encounter (HOSPITAL_COMMUNITY): Payer: Medicare Other

## 2011-01-10 NOTE — H&P (Signed)
NAME:  [name], [name] NO.:  [id number]  MEDICAL RECORD NO.:  [id number]  LOCATION:  [facility]                         FACILITY:  [place]  PHYSICIAN:  Orvan Falconer, MD           DATE OF BIRTH:  04/20/1933  DATE OF ADMISSION:  12/04/2010 DATE OF DISCHARGE:                             HISTORY & PHYSICAL   PRIMARY CARE PHYSICIAN:  Arlyss Repress, MD  GENERAL SURGERY:  Leighton Ruff. Redmond Pulling, MD  ADVANCE DIRECTIVE:  Full code.  REASON FOR ADMISSION:  Abdominal and chest pain.  HISTORY OF PRESENT ILLNESS:  This is a 77 year old obese female with history of non-ST elevation MI in June 2011, status post cardiac catheterization with PTCA and drug-eluting stent to the proximal LAD and diagonal branch, history of acalculous cholecystitis, hypertension, GERD, status post parathyroid tumor resection, status post percutaneous cholecystoscopy by [facility] with recent removal, presents to the emergency room with low chest pain, epigastric pain, nausea, with pain radiating around to her back and right upper quadrant pain.  She denied any fever, chills, shortness of breath, or diaphoresis.  She has no black stool or bloody stool.  Evaluation included a normal EKG, negative cardiac markers, and clear chest x-ray. Her abdominal CT shows fullness of the gallbladder and possibly small leak in patient with recent cholecystoscopy, but no stone seen in the common bile duct.  She does have a lesion in the liver that was recommended followup.  Her white count 11,100, potassium was 3.2, creatinine 0.87, hemoglobin of 10.6.  It should be noted that her liver function tests were normal.  Hospitalist was asked to admit the patient for further evaluation and treatment.  PAST MEDICAL HISTORY:  Acalculous cholecystitis, hepatic steatosis, hypertension, GERD, dyslipidemia, diabetes, parathyroid tumor resection, history of anxiety, depression, hypokalemia, dysphagia, non-ST elevation MI status  post 2 noneluting-drug stents.  ALLERGIES:  SULFA.  CURRENT MEDICATIONS:  Diovan, Lopressor, metformin, Nexium, methycobal, metformin, Diovan, Crestor aspirin 81 mg per day, Brilinta 90 mg b.i.d.  PHYSICAL EXAMINATION:  VITAL SIGNS:  Blood pressure 140/70, pulse of 90, respiratory rate 16, temperature 98.2. GENERAL:  She is alert and oriented and is in no apparent distress. Currently, she is not having any pain. HEENT:  Sclerae are nonicteric. NECK:  Supple.  No stridor. CARDIAC:  S1 and S2, regular.  I did not hear any murmur, rub, or gallop. LUNGS:  Clear. ABDOMEN:  Obese.  Tender over the epigastric area and over the right upper quadrant area.  Bowel sounds present.  No palpable mass. EXTREMITIES:  Showed no edema.  No calf tenderness.  Good distal pulses bilaterally. SKIN:  Warm and dry.  OBJECTIVE FINDINGS:  White count of 11,100, potassium of 3.2, creatinine of 0.87, hemoglobin 10.6.  Liver function tests are normal.  EKG showed no acute ST-T changes and chest x-ray is clear.  Abdominopelvic CT showed fullness of the gallbladder.  No stones and a liver lesion needing to be followed up.  IMPRESSION:  This is a 77 year old obese female with history of recently diagnosed coronary artery disease status post recent drug-eluding stent plaement,presents with abdominal pain and chest pain.  Given that she is pain free now  with normal EKG and negative enzymes, stent thrombosis is unlikely.  I will admit her for rule out and give her pain medication.  We will continue her Brilinta medication.  With respect to her biliary symptoms, it may be obstruction or may be having mild leakage, will treat it with Zosyn.  Please consult Surgery for further recommendations. I suspect that she will need another cholecystoscopy drainage through [facility].  She is stable and will be admitted to [facility].  She is a full code.     Orvan Falconer, MD     PL/MEDQ  D:   12/05/2010  T:  12/05/2010  Job:  [id number]  Electronically Signed by Orvan Falconer  on 01/10/2011 03:04:22 PM

## 2011-01-11 ENCOUNTER — Encounter (HOSPITAL_COMMUNITY): Payer: Medicare Other

## 2011-01-13 ENCOUNTER — Encounter (HOSPITAL_COMMUNITY): Payer: Medicare Other

## 2011-01-15 ENCOUNTER — Encounter (HOSPITAL_COMMUNITY): Payer: Medicare Other

## 2011-01-18 ENCOUNTER — Encounter (HOSPITAL_COMMUNITY): Payer: Medicare Other

## 2011-01-20 ENCOUNTER — Encounter (HOSPITAL_COMMUNITY): Payer: Medicare Other

## 2011-01-21 ENCOUNTER — Encounter (INDEPENDENT_AMBULATORY_CARE_PROVIDER_SITE_OTHER): Payer: Self-pay | Admitting: General Surgery

## 2011-01-21 ENCOUNTER — Ambulatory Visit (INDEPENDENT_AMBULATORY_CARE_PROVIDER_SITE_OTHER): Payer: Medicare Other | Admitting: General Surgery

## 2011-01-21 VITALS — BP 142/74 | HR 60 | Temp 98.2°F | Resp 20 | Ht 63.0 in | Wt 200.1 lb

## 2011-01-21 DIAGNOSIS — K819 Cholecystitis, unspecified: Secondary | ICD-10-CM

## 2011-01-22 ENCOUNTER — Encounter (HOSPITAL_COMMUNITY): Payer: Medicare Other

## 2011-01-25 ENCOUNTER — Encounter (HOSPITAL_COMMUNITY): Payer: Medicare Other

## 2011-01-27 ENCOUNTER — Encounter (HOSPITAL_COMMUNITY): Payer: Medicare Other

## 2011-01-29 ENCOUNTER — Encounter (HOSPITAL_COMMUNITY): Payer: Medicare Other

## 2011-02-01 ENCOUNTER — Encounter (HOSPITAL_COMMUNITY): Payer: Medicare Other

## 2011-02-03 ENCOUNTER — Telehealth (INDEPENDENT_AMBULATORY_CARE_PROVIDER_SITE_OTHER): Payer: Self-pay | Admitting: General Surgery

## 2011-02-03 ENCOUNTER — Encounter (HOSPITAL_COMMUNITY): Payer: Medicare Other

## 2011-02-03 NOTE — Telephone Encounter (Signed)
Patient left a voicemail stating her medications were wrong when she was last here. I called patient back to discuss and received no answer. Left message for her to call back.

## 2011-02-05 ENCOUNTER — Ambulatory Visit (INDEPENDENT_AMBULATORY_CARE_PROVIDER_SITE_OTHER): Payer: Medicare Other | Admitting: Emergency Medicine

## 2011-02-05 ENCOUNTER — Encounter: Payer: Self-pay | Admitting: Emergency Medicine

## 2011-02-05 ENCOUNTER — Encounter (HOSPITAL_COMMUNITY): Payer: Medicare Other

## 2011-02-05 DIAGNOSIS — K760 Fatty (change of) liver, not elsewhere classified: Secondary | ICD-10-CM

## 2011-02-05 DIAGNOSIS — I272 Pulmonary hypertension, unspecified: Secondary | ICD-10-CM | POA: Insufficient documentation

## 2011-02-05 DIAGNOSIS — K7689 Other specified diseases of liver: Secondary | ICD-10-CM

## 2011-02-05 DIAGNOSIS — K319 Disease of stomach and duodenum, unspecified: Secondary | ICD-10-CM | POA: Insufficient documentation

## 2011-02-05 DIAGNOSIS — F329 Major depressive disorder, single episode, unspecified: Secondary | ICD-10-CM | POA: Insufficient documentation

## 2011-02-05 DIAGNOSIS — F411 Generalized anxiety disorder: Secondary | ICD-10-CM

## 2011-02-05 DIAGNOSIS — I10 Essential (primary) hypertension: Secondary | ICD-10-CM

## 2011-02-05 DIAGNOSIS — K219 Gastro-esophageal reflux disease without esophagitis: Secondary | ICD-10-CM

## 2011-02-05 DIAGNOSIS — K819 Cholecystitis, unspecified: Secondary | ICD-10-CM

## 2011-02-05 DIAGNOSIS — I2789 Other specified pulmonary heart diseases: Secondary | ICD-10-CM

## 2011-02-05 DIAGNOSIS — F419 Anxiety disorder, unspecified: Secondary | ICD-10-CM

## 2011-02-05 DIAGNOSIS — I219 Acute myocardial infarction, unspecified: Secondary | ICD-10-CM

## 2011-02-05 DIAGNOSIS — E785 Hyperlipidemia, unspecified: Secondary | ICD-10-CM

## 2011-02-05 DIAGNOSIS — F32A Depression, unspecified: Secondary | ICD-10-CM | POA: Insufficient documentation

## 2011-02-05 DIAGNOSIS — E119 Type 2 diabetes mellitus without complications: Secondary | ICD-10-CM | POA: Insufficient documentation

## 2011-02-05 NOTE — Assessment & Plan Note (Signed)
Secondary likely to systemic HTN, ? OSA.  - check walking oximetry today - PSG as planned - f/u in Dec after her SGY and sleep study.

## 2011-02-05 NOTE — Telephone Encounter (Signed)
Spoke with patient and went through her list of medicines and corrected them. Aware that we have received clearance from Dr Einar Gip but not Dr Maudie Mercury. We will see patient for her pre-op appt.

## 2011-02-05 NOTE — Progress Notes (Signed)
Subjective:    Patient ID: [name], female    DOB: 02-13-1934, 77 y.o.   MRN: [id number]  HPI 77 yo never smoker, hx of HTN, CAD s/p PTCI 6/12, cholecystitis s/p GB drain 9/12, (plan for sgy on 11/29). She has a TTE from 12/05/10 (during the hospitalization) that showed good LV fxn, slightly enlarged LA, estimated PASP of 86mmHg. She believes that her breathing is good, still building herself back up to usual activity level. She is scheduled for a sleep study 11/20.    Review of Systems  Constitutional: Negative.  Negative for fever and unexpected weight change.  HENT: Positive for sneezing. Negative for ear pain, nosebleeds, congestion, sore throat, rhinorrhea, trouble swallowing, dental problem, postnasal drip and sinus pressure.   Eyes: Negative.  Negative for redness and itching.  Respiratory: Positive for shortness of breath. Negative for cough, chest tightness and wheezing.   Cardiovascular: Negative for palpitations and leg swelling.  Gastrointestinal: Negative.  Negative for nausea and vomiting.  Genitourinary: Negative.  Negative for dysuria.  Musculoskeletal: Positive for arthralgias. Negative for joint swelling.  Skin: Negative.  Negative for rash.  Neurological: Negative.  Negative for headaches.  Hematological: Negative.  Does not bruise/bleed easily.  Psychiatric/Behavioral: Negative for dysphoric mood. The patient is nervous/anxious.     Past Medical History  Diagnosis Date  . Diabetes mellitus   . Hypertension   . GERD (gastroesophageal reflux disease)   . Dyslipidemia   . Coronary artery disease   . Hypokalemia   . Acalculous cholecystitis   . Myocardial infarction   . Dizzy   . Fatigue      Family History  Problem Relation Age of Onset  . Heart disease Father   . Heart disease Brother   . Melanoma Son   . Cancer Maternal Grandfather      History   Social History  . Marital Status: Widowed    Spouse Name: N/A    Number of Children: N/A  .  Years of Education: N/A   Occupational History  . RETIRED OFFICE WORKER    Social History Main Topics  . Smoking status: Never Smoker   . Smokeless tobacco: Not on file  . Alcohol Use: Yes  . Drug Use: No  . Sexually Active: Not on file   Other Topics Concern  . Not on file   Social History Narrative  . No narrative on file     Allergies  Allergen Reactions  . Dilaudid (Hydromorphone Hcl)     Caused patient psychological disturbances   . Sulfur      Outpatient Prescriptions Prior to Visit  Medication Sig Dispense Refill  . aspirin 81 MG tablet Take 81 mg by mouth daily.        . brimonidine-timolol (COMBIGAN) 0.2-0.5 % ophthalmic solution Place 1 drop into the left eye every 12 (twelve) hours.        Marland Kitchen esomeprazole (NEXIUM) 40 MG capsule Take 40 mg by mouth daily before breakfast.        . methocarbamol (ROBAXIN) 500 MG tablet Take 500 mg by mouth as needed.        . metoprolol tartrate (LOPRESSOR) 25 MG tablet 1/2 tablet twice daily      . Polyethyl Glycol-Propyl Glycol (SYSTANE ULTRA OP) Apply to eye.        . rosuvastatin (CRESTOR) 5 MG tablet Take 5 mg by mouth daily.        . Ticagrelor (BRILINTA) 90 MG TABS tablet Take 90 mg  by mouth 2 (two) times daily.        . valsartan (DIOVAN) 320 MG tablet Take 160 mg by mouth 1 day or 1 dose.             Objective:   Physical Exam  Gen: Pleasant, obese, in no distress,  normal affect  ENT: No lesions,  mouth clear,  oropharynx clear, no postnasal drip  Neck: No JVD, no TMG, no carotid bruits  Lungs: No use of accessory muscles, no dullness to percussion, clear without rales or rhonchi  Cardiovascular: RRR, heart sounds normal, no murmur or gallops, no peripheral edema  GI: GB drain in place  Musculoskeletal: No deformities, no cyanosis or clubbing  Neuro: alert, non focal  Skin: Warm, no lesions or rashes    CXR Comparison: 12/13/2010  Findings: Artifact overlies the chest. Heart size is normal.  There  is improvement in the chest radiograph appearance with less  venous hypertension and better aeration in the lower lobes. No  worsening or new findings. Mild residual lower lobe volume loss.  IMPRESSION:  Some radiographic improvement. Less venous hypertension and  basilar volume loss.     Assessment & Plan:  Pulmonary Hypertension, secondary Secondary likely to systemic HTN, ? OSA.  - check walking oximetry today - PSG as planned - f/u in Dec after her SGY and sleep study.   Acalculous cholecystitis s/p cholecystostomy tube placement For sgy 11/29  Myocardial infarction Followed by Dr Einar Gip

## 2011-02-05 NOTE — Patient Instructions (Signed)
Get your sleep study as planned Walking oximetry today Follow with Dr Lamonte Sakai in late December.

## 2011-02-05 NOTE — Assessment & Plan Note (Signed)
Followed by Dr Einar Gip

## 2011-02-05 NOTE — Assessment & Plan Note (Signed)
For sgy 11/29

## 2011-02-08 ENCOUNTER — Encounter (HOSPITAL_COMMUNITY): Payer: Medicare Other

## 2011-02-10 ENCOUNTER — Encounter (HOSPITAL_COMMUNITY): Payer: Medicare Other

## 2011-02-11 ENCOUNTER — Encounter (INDEPENDENT_AMBULATORY_CARE_PROVIDER_SITE_OTHER): Payer: Self-pay | Admitting: General Surgery

## 2011-02-11 ENCOUNTER — Ambulatory Visit (INDEPENDENT_AMBULATORY_CARE_PROVIDER_SITE_OTHER): Payer: Medicare Other | Admitting: General Surgery

## 2011-02-11 VITALS — BP 146/82 | HR 68 | Temp 96.9°F | Resp 16 | Ht 63.0 in | Wt 198.4 lb

## 2011-02-11 DIAGNOSIS — K819 Cholecystitis, unspecified: Secondary | ICD-10-CM

## 2011-02-11 NOTE — Patient Instructions (Signed)
Stop taking the Brilinta 1 week before surgery.  You may continue to take your aspirin

## 2011-02-12 ENCOUNTER — Encounter (HOSPITAL_COMMUNITY): Payer: Self-pay

## 2011-02-12 ENCOUNTER — Encounter (HOSPITAL_COMMUNITY): Payer: Medicare Other

## 2011-02-15 ENCOUNTER — Telehealth (INDEPENDENT_AMBULATORY_CARE_PROVIDER_SITE_OTHER): Payer: Self-pay

## 2011-02-15 ENCOUNTER — Encounter (HOSPITAL_COMMUNITY): Payer: Medicare Other

## 2011-02-15 NOTE — Telephone Encounter (Signed)
Pt LMOM on your voicemail stating that she did clear for surgery by DR Maudie Mercury but the note was not dictated yet as of Friday so she mailed you a hand written note by Dr Kim./ [facility]

## 2011-02-16 NOTE — Progress Notes (Signed)
Patient ID: [name], female   DOB: [birth year], 77 y.o.   MRN: [id number]  Chief Complaint  Patient presents with  . Pre-op Exam    Gallbladder sx sch 02/25/11    HPI [name] is a 77 y.o. female.   HPI  Background: 79 year old obese Caucasian female who I initially met in the hospital in late June 2012 with abdominal pain in the right upper abdomen. She was found to have acute acalculous cholecystitis. There were no stones demonstrated on ultrasound or CT imaging. She was initially planned to have a laparoscopic cholecystectomy however she developed chest pressure which prompted an EKG as well as cardiac enzymes. She was found to have a non-ST elevated myocardial infarction. Dr. Einar Gip performed a cardiac catheterization in place several drug-eluting stents into her left anterior descending, proximal descending left anterior and diagonal branch of her LAD. Because of the myocardial infarction she underwent placement of a percutaneous cholecystostomy tube on June 30. She was discharged from the hospital on July 4. I last saw her in the office 01/21/11.   She had a f/u cholangiogram thru her percutaneous cholecystostomy tube on 8/20 which demonstrated patency of her cystic duct and CBD. Since she had no gallstones, we removed her cholecystostomy tube that day. Unfortunately, she developed recurrent epigastric and right upper quadrant pain prompting her to go to the emergency room on September 7. She was readmitted and had her percutaneous cholecystostomy tube replaced on September 9. She was in the hospital until September 21. She grew out Escherichia coli from her gallbladder aspiration. She became confused while in the hospital which prompted her prolonged stay. She comes in today for followup.  She denies any fevers, chills, nausea, vomiting, chest pain, chest pressure, shortness of breath, paroxysmal nocturnal dyspnea, and/or orthopnea. She has had some intermittent loose stool.  She has been able to increase her walking without getting short of breath. She states that she walked 3 laps in the pulmonologist office earlier today and her oxygen saturation remained above 94%. She states that she feels a lot better now that the metformin has been discontinued. She denies any jaundice. She reports a good appetite.  Past Medical History  Diagnosis Date  . Diabetes mellitus   . Hypertension   . GERD (gastroesophageal reflux disease)   . Dyslipidemia   . Coronary artery disease   . Hypokalemia   . Acalculous cholecystitis   . Myocardial infarction   . Dizzy   . Fatigue     Past Surgical History  Procedure Date  . Parathyroidectomy     tumor excision  . Abdominal hysterectomy   . Shoulder surgery     right  . Cataract extraction     2  . Coronary angioplasty with stent placement 09/24/10    drug eluting stents    Family History  Problem Relation Age of Onset  . Heart disease Father   . Heart disease Brother   . Melanoma Son   . Cancer Maternal Grandfather     Social History History  Substance Use Topics  . Smoking status: Never Smoker   . Smokeless tobacco: Never Used  . Alcohol Use: Yes    Allergies  Allergen Reactions  . Dilaudid (Hydromorphone Hcl) Other (See Comments)    Caused patient psychological disturbances   . Sulfur Rash    Current Outpatient Prescriptions  Medication Sig Dispense Refill  . aspirin 81 MG tablet Take 81 mg by mouth daily.        Marland Kitchen  brimonidine-timolol (COMBIGAN) 0.2-0.5 % ophthalmic solution Place 1 drop into the left eye every 12 (twelve) hours.        Marland Kitchen esomeprazole (NEXIUM) 40 MG capsule Take 40 mg by mouth daily before breakfast.        . methocarbamol (ROBAXIN) 500 MG tablet Take 500-1,000 mg by mouth every 6 (six) hours as needed. For spasm.      . metoprolol tartrate (LOPRESSOR) 25 MG tablet Take 12.5 mg by mouth 2 (two) times daily. 1/2 tablet twice daily      . Polyethyl Glycol-Propyl Glycol (SYSTANE ULTRA  OP) Apply 2-3 drops to eye daily as needed. For dry eyes.      . rosuvastatin (CRESTOR) 5 MG tablet Take 5 mg by mouth daily.        . Ticagrelor (BRILINTA) 90 MG TABS tablet Take 90 mg by mouth 2 (two) times daily.        . valsartan (DIOVAN) 320 MG tablet Take 160 mg by mouth daily.         Review of Systems Review of Systems A comprehensive 12 points ROS was performed. All systems are negative except for what is mentioned in the HPI  Blood pressure 146/82, pulse 68, temperature 96.9 F (36.1 C), temperature source Temporal, resp. rate 16, height 5\' 3"  (1.6 m), weight 198 lb 6.4 oz (89.994 kg).  Physical Exam Physical Exam  Vitals reviewed. Constitutional: She is oriented to person, place, and time. She appears well-developed and well-nourished. No distress.       obese  HENT:  Head: Normocephalic and atraumatic.  Eyes: Conjunctivae are normal. No scleral icterus.  Neck: Normal range of motion. Neck supple. No JVD present.  Cardiovascular: Normal rate, regular rhythm, normal heart sounds and intact distal pulses.   No murmur heard. Pulmonary/Chest: Effort normal and breath sounds normal. No respiratory distress. She has no wheezes.  Abdominal: Soft. Bowel sounds are normal. She exhibits no distension. There is no tenderness. There is no rebound.       Perc drain on rt side intact with bile in drainage bag.   Musculoskeletal: Normal range of motion. She exhibits no edema.  Neurological: She is alert and oriented to person, place, and time. She exhibits normal muscle tone.  Skin: Skin is warm and dry. She is not diaphoretic.       No jaundice  Psychiatric: She has a normal mood and affect. Her behavior is normal. Judgment and thought content normal.    Data Reviewed My note from 10/25 & Dr Agustina Caroli note  Assessment    Chronic acalculous cholecystitis s/p percutaneous cholecystostomy tube placement         . Hypertension  . Myocardial infarction  . Hyperlipidemia  .  Diabetes mellitus type II  . GERD (gastroesophageal reflux disease)  . Anxiety  . Depression  . Hepatic steatosis  . Pulmonary Hypertension, secondary       Plan    The patient is scheduled for Laparoscopic cholecystectomy on November 29 which will be about 11.5 weeks from her drain placement. This time should be enough for the inflammation to have resolved.  We will plan to get a cholangiogram thru her current cholecystostomy tube prior to going to the OR since I may not be able to perform one intraoperatively.    We discussed gallbladder disease. The patient was given Neurosurgeon. We discussed continued non-operative and operative management.   I discussed laparoscopic cholecystectomy with possibility of converting to open in detail.  The patient was given educational material as well as diagrams detailing the procedure.  We discussed the risks and benefits of a laparoscopic cholecystectomy including, but not limited to bleeding, infection, injury to surrounding structures such as the intestine or liver, bile leak, retained gallstones, need to convert to an open procedure, prolonged diarrhea, blood clots such as  DVT, common bile duct injury, anesthesia risks, cardiac complications and possible need for additional procedures.  We discussed the typical post-operative recovery course. I explained that the likelihood of improvement of their symptoms is good. I explained that she is higher risk for bleeding, abscess, injury to surrounding structures, and possible conversion to open procedure given her 2 bouts of cholecystitis.  We discussed the possibility of need for blood transfusion perioperatively to keep her hgb at least >8 given her MI in June 2012. I also explained that she may develop confusion again during this hospitalization but we would certainly avoid dilaudid.  Her cardiologist has cleared her for surgery and states we can stop her Brilinta 7 days prior to surgery.  I will  allow the pt to continue her Aspirin up until surgery.   We will follow up on her OSA test that is scheduled for 11/20.  Leighton Ruff. Redmond Pulling, MD, FACS General, Bariatric, & Minimally Invasive Surgery [facility], [place]         [facility] M 02/16/2011, 11:36 AM

## 2011-02-16 NOTE — Progress Notes (Signed)
[name] is a 77 y.o. female.    Chief Complaint  Patient presents with  . Routine Post Op    recheck chole drains     HPI HPI Background: 14 year old obese Caucasian female who I initially met in the hospital in late June 2012 with abdominal pain in the right upper abdomen. She was found to have acute acalculous cholecystitis. There were no stones demonstrated on ultrasound or CT imaging. She was initially planned to have a laparoscopic cholecystectomy however she developed chest pressure which prompted an EKG as well as cardiac enzymes. She was found to have a non-ST elevated myocardial infarction. Dr. Einar Gip performed a cardiac catheterization in place several drug-eluting stents into her left anterior descending, proximal descending left anterior and diagonal branch of her LAD. Because of the myocardial infarction she underwent placement of a percutaneous cholecystostomy tube on June 30. She was discharged from the hospital on July 4. I last saw her in the office 11/06/10.  She had a f/u cholangiogram thru her percutaneous cholecystostomy tube on 8/20 which demonstrated patency of her cystic duct and CBD.  Since she had no gallstones, we removed her cholecystostomy tube that day. Unfortunately, she developed recurrent epigastric and right upper quadrant pain prompting her to go to the emergency room on September 7. She was readmitted and had her percutaneous cholecystostomy tube replaced on September 9. She was in the hospital until September 21. She grew out Escherichia coli from her gallbladder aspiration. She became confused while in the hospital which prompted her prolonged stay. She comes in today for followup.  She states that she is doing well. She denies any fevers or chills. She denies any nausea, vomiting, or abdominal pain. She just has some soreness around her percutaneous drain. She is recording her output which is averaging about 5-7ounces of bile per day. She reports that her  appetite is improving. She is still getting physical therapy. She reports normal bowel movements. She reports she had extreme confusion while in the hospital. They believe it was due to dilaudid. She is scheduled to have an obstructive sleep apnea test on November 20. She is also to be evaluated by pulmonary medicine physician    Past Medical History  Diagnosis Date  . Diabetes mellitus   . Hypertension   . GERD (gastroesophageal reflux disease)   . Dyslipidemia   . Coronary artery disease   . Hypokalemia   . Acalculous cholecystitis   . Myocardial infarction   . Dizzy   . Fatigue     Past Surgical History  Procedure Date  . Parathyroidectomy     tumor excision  . Abdominal hysterectomy   . Shoulder surgery     right  . Cataract extraction     2  . Coronary angioplasty with stent placement 09/24/10    drug eluting stents    Family History  Problem Relation Age of Onset  . Heart disease Father   . Heart disease Brother   . Melanoma Son   . Cancer Maternal Grandfather     Social History History  Substance Use Topics  . Smoking status: Never Smoker   . Smokeless tobacco: Never Used  . Alcohol Use: Yes    Allergies  Allergen Reactions  . Dilaudid (Hydromorphone Hcl) Other (See Comments)    Caused patient psychological disturbances   . Sulfur Rash    Current Outpatient Prescriptions  Medication Sig Dispense Refill  . aspirin 81 MG tablet Take 81 mg by mouth daily.        Marland Kitchen  brimonidine-timolol (COMBIGAN) 0.2-0.5 % ophthalmic solution Place 1 drop into the left eye every 12 (twelve) hours.        Marland Kitchen esomeprazole (NEXIUM) 40 MG capsule Take 40 mg by mouth daily before breakfast.        . methocarbamol (ROBAXIN) 500 MG tablet Take 500-1,000 mg by mouth every 6 (six) hours as needed. For spasm.      . metoprolol tartrate (LOPRESSOR) 25 MG tablet Take 12.5 mg by mouth 2 (two) times daily. 1/2 tablet twice daily      . Polyethyl Glycol-Propyl Glycol (SYSTANE ULTRA OP)  Apply 2-3 drops to eye daily as needed. For dry eyes.      . rosuvastatin (CRESTOR) 5 MG tablet Take 5 mg by mouth daily.        . Ticagrelor (BRILINTA) 90 MG TABS tablet Take 90 mg by mouth 2 (two) times daily.        . valsartan (DIOVAN) 320 MG tablet Take 160 mg by mouth daily.         Review of Systems Review of Systems  Constitutional: Negative for fever, chills and weight loss.  HENT: Negative.   Eyes: Negative.   Respiratory:negative for shortness of breath.   Cardiovascular: Negative for chest pain.        +DOE  Gastrointestinal: Negative.   Genitourinary: Negative.   Skin: Negative.   Neurological: Negative.   Psychiatric/Behavioral: Negative.     Physical Exam Physical Exam  Vitals reviewed. Constitutional: She is oriented to person, place, and time. She appears well-developed and well-nourished.  HENT:  Head: Normocephalic and atraumatic.  Eyes: Pupils are equal, round, and reactive to light. No scleral icterus.  Neck: Normal range of motion. No tracheal deviation present.  Cardiovascular: Normal rate, regular rhythm and normal heart sounds.   Respiratory: Effort normal and breath sounds normal. No respiratory distress. She has no wheezes.  GI: Soft. Bowel sounds are normal. She exhibits no distension. There is no tenderness. There is no guarding.       Percutaneous drain Rt lat abdomina wall- drain site- no infection.  Some green bile in drain bag  Musculoskeletal: Normal range of motion.  Neurological: She is alert and oriented to person, place, and time.  Skin: Skin is warm and dry. She is not diaphoretic. No pallor.  Psychiatric: She has a normal mood and affect. Her behavior is normal. Judgment normal.      Data reviewed: I reviewed her history and physical from Sept as well as her discharge summary from Sept 21. I Reviewed my note from last visit and her cultures.   Assessment/Plan  77 year old Caucasian female with acalculous acute cholecystitis status  post percutaneous cholecystostomy tube placement with removal and a non-ST elevated myocardial infarction (late June 2012) status post cardiac stent placement with recurrent cholecystitis s/p replacement of percutaneous cholecystostomy tube placement.   At this point I do not believe nonsurgical management of her gallbladder is an option any longer now that she has had a recurrent bout of  cholecystitis. It now comes down to timing. I still would like to wait at least  10 weeks from her most recent percutaneous cholecystostomy tube placement before we operate. We also need to wait until December at least given her myocardial infarction in late June 2012. We will also need to obtain pulmonary and cardiac clearance.  We also need to see the results of her sleep apnea test. She will followup with me in several weeks.   Leighton Ruff.  Redmond Pulling, MD  Leighton Ruff. Redmond Pulling, MD, FACS General, Bariatric, & Minimally Invasive Surgery [facility]  (late entry from 01/21/11)

## 2011-02-17 ENCOUNTER — Ambulatory Visit (HOSPITAL_COMMUNITY)
Admission: RE | Admit: 2011-02-17 | Discharge: 2011-02-17 | Disposition: A | Discharge status: Home / Self Care | Payer: Medicare Other | Source: Ambulatory Visit | Attending: General Surgery | Admitting: General Surgery

## 2011-02-17 ENCOUNTER — Encounter (HOSPITAL_COMMUNITY): Payer: Medicare Other

## 2011-02-17 DIAGNOSIS — K819 Cholecystitis, unspecified: Secondary | ICD-10-CM | POA: Insufficient documentation

## 2011-02-17 MED ORDER — IOHEXOL 300 MG/ML  SOLN
50.0000 mL | Freq: Once | INTRAMUSCULAR | Status: AC | PRN
  Administered 2011-02-17: 10 mL via INTRAVENOUS

## 2011-02-17 NOTE — Procedures (Signed)
Cholangiogram through existing cholecystostomy shows patency of the cystic duct and CBD Contrast reaches duodenum easily No obstruction or CBD stone Stable Drain left in Cholecystectomy scheduled next few weeks D/c home

## 2011-02-19 ENCOUNTER — Encounter (HOSPITAL_COMMUNITY): Payer: Medicare Other

## 2011-02-22 ENCOUNTER — Encounter (HOSPITAL_COMMUNITY): Payer: Medicare Other

## 2011-02-23 ENCOUNTER — Encounter (HOSPITAL_COMMUNITY)
Admission: RE | Admit: 2011-02-23 | Discharge: 2011-02-23 | Disposition: A | Discharge status: Home / Self Care | Payer: Medicare Other | Source: Ambulatory Visit | Attending: General Surgery | Admitting: General Surgery

## 2011-02-23 ENCOUNTER — Ambulatory Visit (HOSPITAL_COMMUNITY): Admission: RE | Admit: 2011-02-23 | Payer: Medicare Other | Source: Ambulatory Visit

## 2011-02-23 ENCOUNTER — Encounter (HOSPITAL_COMMUNITY): Payer: Self-pay

## 2011-02-23 ENCOUNTER — Telehealth (INDEPENDENT_AMBULATORY_CARE_PROVIDER_SITE_OTHER): Payer: Self-pay

## 2011-02-23 HISTORY — DX: Other specified postprocedural states: Z98.890

## 2011-02-23 HISTORY — DX: Other specified postprocedural states: R11.2

## 2011-02-23 HISTORY — DX: Unspecified osteoarthritis, unspecified site: M19.90

## 2011-02-23 HISTORY — DX: Encounter for other specified aftercare: Z51.89

## 2011-02-23 HISTORY — DX: Shortness of breath: R06.02

## 2011-02-23 LAB — COMPREHENSIVE METABOLIC PANEL
BUN: 21 mg/dL (ref 6–23)
CO2: 23 mEq/L (ref 19–32)
Chloride: 105 mEq/L (ref 96–112)
Creatinine, Ser: 1.01 mg/dL (ref 0.50–1.10)
GFR calc non Af Amer: 53 mL/min — ABNORMAL LOW (ref >90)
Glucose, Bld: 91 mg/dL (ref 70–99)
Total Bilirubin: 0.4 mg/dL (ref 0.3–1.2)

## 2011-02-23 LAB — DIFFERENTIAL
Basophils Absolute: 0 10*3/uL (ref 0.0–0.1)
Basophils Relative: 0 % (ref 0–1)
Neutro Abs: 4 10*3/uL (ref 1.7–7.7)
Neutrophils Relative %: 53 % (ref 43–77)

## 2011-02-23 LAB — CBC
HCT: 34.1 % — ABNORMAL LOW (ref 36.0–46.0)
MCV: 92.4 fL (ref 78.0–100.0)
RBC: 3.69 MIL/uL — ABNORMAL LOW (ref 3.87–5.11)
WBC: 7.5 10*3/uL (ref 4.0–10.5)

## 2011-02-23 LAB — TYPE AND SCREEN: ABO/RH(D): A POS

## 2011-02-23 LAB — SURGICAL PCR SCREEN: Staphylococcus aureus: NEGATIVE

## 2011-02-23 NOTE — Progress Notes (Signed)
ekg 9/12, cath 7/12, echo 9/12 in chart req sleep study by tarra sec from [facility] Patient claustraphobic

## 2011-02-23 NOTE — Consult Note (Signed)
Anesthesia:  Patient is a 77 year old female for lap chole on 02/25/11.  In June of this year she was admitted for abdominal pain and was found to have acalculous cholecystitis.  She was scheduled for lap chole then, but she developed CP and was found to have a NSTEMI and a percutaneous cholecystostomy tube was eventually placed instead.  She underwent cardiac cath on 09/24/10 (under Notes tab) by Dr. Einar Gip.  He placed drug eluting stents to her mid and proximal LAD and to D1.  She had an echocardiogram on 12/05/10 (under Notes tab) showing EF 65-70%, normal LV wall motion, LA mildly dilated, PA systolic pressure was severely increased (PA peak pressure 72 mmHG).  Her EKG from 12/17/10 shows evidence of inferior infarct.  Per Dr. Dois Davenport last office note, Dr. Einar Gip has cleared [name] for this procedure.  I spoke with Judson Roch at [place] and asked them to fax the clearance note to [phone].   Her other past medical history is significant for HTN, GERD, dyslipidemia, DM, and OA.  Her pulmonologist is Dr. Lamonte Sakai, whom she last saw on 02/05/11.  He is also aware of planned procedure.  Her labs and CXR are acceptable from an anesthesia standpoint.  Plan to proceed.  I have placed her chart in the nurse follow-up cabinet, as her cardiac clearance and sleep study report are still pending.

## 2011-02-23 NOTE — Progress Notes (Signed)
Chart given to [facility] zelenak anesthesia to review

## 2011-02-23 NOTE — Telephone Encounter (Signed)
Clearance faxed

## 2011-02-23 NOTE — Pre-Procedure Instructions (Signed)
[place]  02/23/2011   Your procedure is scheduled on:  02/25/11  Report to [facility] at 530 AM.  Call this number if you have problems the morning of surgery: [phone]   Remember:   Do not eat food:After Midnight.  May have clear liquids: up to 4 Hours before arrival.  Clear liquids include soda, tea, black coffee, apple or grape juice, broth.  Take these medicines the morning of surgery with A SIP OF WATER nexium ,metoprolol   Do not wear jewelry, make-up or nail polish.  Do not wear lotions, powders, or perfumes. You may wear deodorant.  Do not shave 48 hours prior to surgery.  Do not bring valuables to the hospital.  Contacts, dentures or bridgework may not be worn into surgery.  Leave suitcase in the car. After surgery it may be brought to your room.  For patients admitted to the hospital, checkout time is 11:00 AM the day of discharge.   Patients discharged the day of surgery will not be allowed to drive home.  Name and phone number of your driver: danny [phone]   Special Instructions: CHG Shower Use Special Wash: 1/2 bottle night before surgery and 1/2 bottle morning of surgery.   Please read over the following fact sheets that you were given: Pain Booklet, Coughing and Deep Breathing, Blood Transfusion Information, MRSA Information and Surgical Site Infection Prevention

## 2011-02-23 NOTE — Telephone Encounter (Signed)
She needs the cardiac clearance faxed from Dr Einar Gip.  Dr Redmond Pulling stated the pt was cleared.  The fax # is [phone].  Surgery is 11/29

## 2011-02-24 ENCOUNTER — Encounter (HOSPITAL_COMMUNITY): Payer: Medicare Other

## 2011-02-24 ENCOUNTER — Telehealth (INDEPENDENT_AMBULATORY_CARE_PROVIDER_SITE_OTHER): Payer: Self-pay | Admitting: General Surgery

## 2011-02-24 MED ORDER — DEXTROSE 5 % IV SOLN
2.0000 g | INTRAVENOUS | Status: AC
  Administered 2011-02-25: 2 g via INTRAVENOUS
  Filled 2011-02-24: qty 2

## 2011-02-24 NOTE — Telephone Encounter (Signed)
Spoke with patient, she has copy of medical clearance from Dr Maudie Mercury, he wrote on a RX pad and gave to patient. I have contacted their office multiple times to obtain clearance and still have received nothing. Made patient aware to bring her prescription with clearance to the hospital tomorrow in case there are any questions and to make sure and give to them if there are. Patient already planned on bringing it and agrees to this plan. Cardiac clearance already faxed.

## 2011-02-25 ENCOUNTER — Encounter (HOSPITAL_COMMUNITY): Payer: Self-pay | Admitting: *Deleted

## 2011-02-25 ENCOUNTER — Encounter (HOSPITAL_COMMUNITY): Payer: Self-pay | Admitting: Vascular Surgery

## 2011-02-25 ENCOUNTER — Ambulatory Visit (HOSPITAL_COMMUNITY): Payer: Medicare Other | Admitting: Vascular Surgery

## 2011-02-25 ENCOUNTER — Other Ambulatory Visit (INDEPENDENT_AMBULATORY_CARE_PROVIDER_SITE_OTHER): Payer: Self-pay | Admitting: General Surgery

## 2011-02-25 ENCOUNTER — Encounter (HOSPITAL_COMMUNITY)
Admission: RE | Disposition: A | Discharge status: Home Health | Payer: Self-pay | Source: Ambulatory Visit | Attending: General Surgery

## 2011-02-25 ENCOUNTER — Inpatient Hospital Stay (HOSPITAL_COMMUNITY)
Admission: RE | Admit: 2011-02-25 | Discharge: 2011-02-26 | DRG: 419 | Disposition: A | Discharge status: Home Health | Payer: Medicare Other | Source: Ambulatory Visit | Attending: General Surgery | Admitting: General Surgery

## 2011-02-25 DIAGNOSIS — I10 Essential (primary) hypertension: Secondary | ICD-10-CM | POA: Diagnosis present

## 2011-02-25 DIAGNOSIS — E119 Type 2 diabetes mellitus without complications: Secondary | ICD-10-CM | POA: Diagnosis present

## 2011-02-25 DIAGNOSIS — Z9049 Acquired absence of other specified parts of digestive tract: Secondary | ICD-10-CM

## 2011-02-25 DIAGNOSIS — I252 Old myocardial infarction: Secondary | ICD-10-CM

## 2011-02-25 DIAGNOSIS — I2510 Atherosclerotic heart disease of native coronary artery without angina pectoris: Secondary | ICD-10-CM | POA: Diagnosis present

## 2011-02-25 DIAGNOSIS — K219 Gastro-esophageal reflux disease without esophagitis: Secondary | ICD-10-CM | POA: Diagnosis present

## 2011-02-25 DIAGNOSIS — K7689 Other specified diseases of liver: Secondary | ICD-10-CM | POA: Diagnosis present

## 2011-02-25 DIAGNOSIS — Z9861 Coronary angioplasty status: Secondary | ICD-10-CM

## 2011-02-25 DIAGNOSIS — K819 Cholecystitis, unspecified: Secondary | ICD-10-CM

## 2011-02-25 DIAGNOSIS — E669 Obesity, unspecified: Secondary | ICD-10-CM | POA: Diagnosis present

## 2011-02-25 DIAGNOSIS — K8000 Calculus of gallbladder with acute cholecystitis without obstruction: Secondary | ICD-10-CM

## 2011-02-25 DIAGNOSIS — Z882 Allergy status to sulfonamides status: Secondary | ICD-10-CM

## 2011-02-25 DIAGNOSIS — Z01812 Encounter for preprocedural laboratory examination: Secondary | ICD-10-CM

## 2011-02-25 DIAGNOSIS — Z7982 Long term (current) use of aspirin: Secondary | ICD-10-CM

## 2011-02-25 DIAGNOSIS — I219 Acute myocardial infarction, unspecified: Secondary | ICD-10-CM

## 2011-02-25 DIAGNOSIS — F341 Dysthymic disorder: Secondary | ICD-10-CM | POA: Diagnosis present

## 2011-02-25 DIAGNOSIS — I2789 Other specified pulmonary heart diseases: Secondary | ICD-10-CM | POA: Diagnosis present

## 2011-02-25 DIAGNOSIS — E785 Hyperlipidemia, unspecified: Secondary | ICD-10-CM | POA: Diagnosis present

## 2011-02-25 DIAGNOSIS — Z79899 Other long term (current) drug therapy: Secondary | ICD-10-CM

## 2011-02-25 DIAGNOSIS — K811 Chronic cholecystitis: Principal | ICD-10-CM | POA: Diagnosis present

## 2011-02-25 HISTORY — PX: CHOLECYSTECTOMY: SHX55

## 2011-02-25 SURGERY — LAPAROSCOPIC CHOLECYSTECTOMY WITH INTRAOPERATIVE CHOLANGIOGRAM
Anesthesia: General | Site: Abdomen | Wound class: Contaminated

## 2011-02-25 MED ORDER — ONDANSETRON HCL 4 MG/2ML IJ SOLN: 4.0000 mg | Freq: Four times a day (QID) | INTRAMUSCULAR | Status: DC | PRN

## 2011-02-25 MED ORDER — OLMESARTAN MEDOXOMIL 40 MG PO TABS
40.0000 mg | ORAL_TABLET | Freq: Every day | ORAL | Status: DC
  Administered 2011-02-26: 40 mg via ORAL
  Filled 2011-02-25 (×2): qty 1

## 2011-02-25 MED ORDER — GLYCOPYRROLATE 0.2 MG/ML IJ SOLN
INTRAMUSCULAR | Status: DC | PRN
  Administered 2011-02-25: .4 mg via INTRAVENOUS

## 2011-02-25 MED ORDER — BUPIVACAINE-EPINEPHRINE 0.25% -1:200000 IJ SOLN
INTRAMUSCULAR | Status: DC | PRN
  Administered 2011-02-25: 16 mL

## 2011-02-25 MED ORDER — OXYCODONE-ACETAMINOPHEN 5-325 MG PO TABS
1.0000 | ORAL_TABLET | ORAL | Status: DC | PRN
  Administered 2011-02-25 – 2011-02-26 (×6): 1 via ORAL
  Filled 2011-02-25 (×3): qty 1
  Filled 2011-02-25: qty 2
  Filled 2011-02-25 (×2): qty 1

## 2011-02-25 MED ORDER — NEOSTIGMINE METHYLSULFATE 1 MG/ML IJ SOLN
INTRAMUSCULAR | Status: DC | PRN
  Administered 2011-02-25: 3 mg via INTRAVENOUS

## 2011-02-25 MED ORDER — DEXTROSE 5 % IV SOLN
2.0000 g | Freq: Four times a day (QID) | INTRAVENOUS | Status: AC
  Administered 2011-02-25 – 2011-02-26 (×3): 2 g via INTRAVENOUS
  Filled 2011-02-25 (×4): qty 2

## 2011-02-25 MED ORDER — ONDANSETRON HCL 4 MG/2ML IJ SOLN
INTRAMUSCULAR | Status: DC | PRN
  Administered 2011-02-25: 4 mg via INTRAVENOUS

## 2011-02-25 MED ORDER — ONDANSETRON HCL 4 MG PO TABS: 4.0000 mg | ORAL_TABLET | Freq: Four times a day (QID) | ORAL | Status: DC | PRN

## 2011-02-25 MED ORDER — BRIMONIDINE TARTRATE 0.2 % OP SOLN
1.0000 [drp] | Freq: Two times a day (BID) | OPHTHALMIC | Status: DC
  Administered 2011-02-25 – 2011-02-26 (×2): 1 [drp] via OPHTHALMIC
  Filled 2011-02-25 (×2): qty 5

## 2011-02-25 MED ORDER — MORPHINE SULFATE 2 MG/ML IJ SOLN
1.0000 mg | INTRAMUSCULAR | Status: DC | PRN
  Administered 2011-02-25: 2 mg via INTRAVENOUS
  Filled 2011-02-25: qty 1

## 2011-02-25 MED ORDER — BRIMONIDINE TARTRATE-TIMOLOL 0.2-0.5 % OP SOLN: 1.0000 [drp] | Freq: Two times a day (BID) | OPHTHALMIC | Status: DC

## 2011-02-25 MED ORDER — DEXAMETHASONE SODIUM PHOSPHATE 4 MG/ML IJ SOLN
INTRAMUSCULAR | Status: DC | PRN
  Administered 2011-02-25: 4 mg via INTRAVENOUS

## 2011-02-25 MED ORDER — ROCURONIUM BROMIDE 100 MG/10ML IV SOLN
INTRAVENOUS | Status: DC | PRN
  Administered 2011-02-25: 30 mg via INTRAVENOUS

## 2011-02-25 MED ORDER — HYDROMORPHONE HCL PF 1 MG/ML IJ SOLN: 0.2500 mg | INTRAMUSCULAR | Status: DC | PRN

## 2011-02-25 MED ORDER — EPHEDRINE SULFATE 50 MG/ML IJ SOLN
INTRAMUSCULAR | Status: DC | PRN
  Administered 2011-02-25: 10 mg via INTRAVENOUS

## 2011-02-25 MED ORDER — TIMOLOL MALEATE 0.5 % OP SOLN
1.0000 [drp] | Freq: Two times a day (BID) | OPHTHALMIC | Status: DC
  Administered 2011-02-25 – 2011-02-26 (×2): 1 [drp] via OPHTHALMIC
  Filled 2011-02-25: qty 5

## 2011-02-25 MED ORDER — LACTATED RINGERS IV SOLN
INTRAVENOUS | Status: DC | PRN
  Administered 2011-02-25 (×2): via INTRAVENOUS

## 2011-02-25 MED ORDER — PANTOPRAZOLE SODIUM 40 MG PO TBEC
40.0000 mg | DELAYED_RELEASE_TABLET | Freq: Every day | ORAL | Status: DC
  Administered 2011-02-26: 40 mg via ORAL
  Filled 2011-02-25: qty 1

## 2011-02-25 MED ORDER — SODIUM CHLORIDE 0.9 % IR SOLN
Status: DC | PRN
  Administered 2011-02-25 (×2): 1000 mL

## 2011-02-25 MED ORDER — POTASSIUM CHLORIDE IN NACL 20-0.9 MEQ/L-% IV SOLN
INTRAVENOUS | Status: DC
  Administered 2011-02-25 – 2011-02-26 (×2): via INTRAVENOUS
  Filled 2011-02-25 (×3): qty 1000

## 2011-02-25 MED ORDER — FENTANYL CITRATE 0.05 MG/ML IJ SOLN
INTRAMUSCULAR | Status: DC | PRN
  Administered 2011-02-25: 50 ug via INTRAVENOUS
  Administered 2011-02-25: 100 ug via INTRAVENOUS
  Administered 2011-02-25 (×2): 50 ug via INTRAVENOUS

## 2011-02-25 MED ORDER — METOPROLOL TARTRATE 12.5 MG HALF TABLET
12.5000 mg | ORAL_TABLET | Freq: Two times a day (BID) | ORAL | Status: DC
Start: 2011-02-25 — End: 2011-02-26
  Administered 2011-02-25 – 2011-02-26 (×2): 12.5 mg via ORAL
  Filled 2011-02-25 (×5): qty 1

## 2011-02-25 MED ORDER — FENTANYL CITRATE 0.05 MG/ML IJ SOLN
25.0000 ug | INTRAMUSCULAR | Status: DC | PRN
  Administered 2011-02-25: 25 ug via INTRAVENOUS

## 2011-02-25 MED ORDER — PHENYLEPHRINE HCL 10 MG/ML IJ SOLN
INTRAMUSCULAR | Status: DC | PRN
  Administered 2011-02-25: 80 ug via INTRAVENOUS

## 2011-02-25 MED ORDER — ONDANSETRON HCL 4 MG/2ML IJ SOLN: 4.0000 mg | Freq: Once | INTRAMUSCULAR | Status: DC | PRN

## 2011-02-25 MED ORDER — ROSUVASTATIN CALCIUM 5 MG PO TABS
5.0000 mg | ORAL_TABLET | Freq: Every day | ORAL | Status: DC
  Administered 2011-02-25: 5 mg via ORAL
  Filled 2011-02-25 (×3): qty 1

## 2011-02-25 MED ORDER — PROPOFOL 10 MG/ML IV EMUL
INTRAVENOUS | Status: DC | PRN
  Administered 2011-02-25: 150 mg via INTRAVENOUS

## 2011-02-25 MED ORDER — ACETAMINOPHEN 325 MG PO TABS: 650.0000 mg | ORAL_TABLET | ORAL | Status: DC | PRN

## 2011-02-25 SURGICAL SUPPLY — 48 items
APPLIER CLIP 5 13 M/L LIGAMAX5 (MISCELLANEOUS) ×2
BANDAGE ADHESIVE 1X3 (GAUZE/BANDAGES/DRESSINGS) IMPLANT
BENZOIN TINCTURE PRP APPL 2/3 (GAUZE/BANDAGES/DRESSINGS) IMPLANT
BLADE SURG ROTATE 9660 (MISCELLANEOUS) IMPLANT
CANISTER SUCTION 2500CC (MISCELLANEOUS) ×2 IMPLANT
CHLORAPREP W/TINT 26ML (MISCELLANEOUS) ×2 IMPLANT
CLIP APPLIE 5 13 M/L LIGAMAX5 (MISCELLANEOUS) ×1 IMPLANT
CLOTH BEACON ORANGE TIMEOUT ST (SAFETY) ×2 IMPLANT
COVER MAYO STAND STRL (DRAPES) IMPLANT
COVER SURGICAL LIGHT HANDLE (MISCELLANEOUS) ×2 IMPLANT
DECANTER SPIKE VIAL GLASS SM (MISCELLANEOUS) ×2 IMPLANT
DRAPE C-ARM 42X72 X-RAY (DRAPES) IMPLANT
DRAPE UTILITY 15X26 W/TAPE STR (DRAPE) ×4 IMPLANT
DRSG TEGADERM 4X4.75 (GAUZE/BANDAGES/DRESSINGS) ×2 IMPLANT
ELECT REM PT RETURN 9FT ADLT (ELECTROSURGICAL) ×2
ELECTRODE REM PT RTRN 9FT ADLT (ELECTROSURGICAL) ×1 IMPLANT
FILTER SMOKE EVAC LAPAROSHD (FILTER) ×2 IMPLANT
GAUZE SPONGE 2X2 8PLY STRL LF (GAUZE/BANDAGES/DRESSINGS) ×1 IMPLANT
GLOVE BIO SURGEON STRL SZ7.5 (GLOVE) ×2 IMPLANT
GLOVE BIO SURGEON STRL SZ8 (GLOVE) ×2 IMPLANT
GLOVE BIOGEL PI IND STRL 7.5 (GLOVE) ×1 IMPLANT
GLOVE BIOGEL PI IND STRL 8 (GLOVE) ×2 IMPLANT
GLOVE BIOGEL PI INDICATOR 7.5 (GLOVE) ×1
GLOVE BIOGEL PI INDICATOR 8 (GLOVE) ×2
GLOVE ECLIPSE 7.5 STRL STRAW (GLOVE) ×2 IMPLANT
GLOVE EUDERMIC 7 POWDERFREE (GLOVE) ×4 IMPLANT
GOWN BRE IMP SLV AUR XL STRL (GOWN DISPOSABLE) ×4 IMPLANT
GOWN STRL NON-REIN LRG LVL3 (GOWN DISPOSABLE) ×4 IMPLANT
GOWN STRL REIN XL XLG (GOWN DISPOSABLE) ×2 IMPLANT
KIT BASIN OR (CUSTOM PROCEDURE TRAY) ×2 IMPLANT
KIT ROOM TURNOVER OR (KITS) ×2 IMPLANT
NS IRRIG 1000ML POUR BTL (IV SOLUTION) ×2 IMPLANT
PAD ARMBOARD 7.5X6 YLW CONV (MISCELLANEOUS) ×2 IMPLANT
POUCH SPECIMEN RETRIEVAL 10MM (ENDOMECHANICALS) ×2 IMPLANT
SCISSORS LAP 5X35 DISP (ENDOMECHANICALS) IMPLANT
SET CHOLANGIOGRAPH 5 50 .035 (SET/KITS/TRAYS/PACK) IMPLANT
SET IRRIG TUBING LAPAROSCOPIC (IRRIGATION / IRRIGATOR) ×2 IMPLANT
SLEEVE ENDOPATH XCEL 5M (ENDOMECHANICALS) ×4 IMPLANT
SPECIMEN JAR SMALL (MISCELLANEOUS) ×2 IMPLANT
SPONGE GAUZE 2X2 STER 10/PKG (GAUZE/BANDAGES/DRESSINGS) ×1
SUT MNCRL AB 4-0 PS2 18 (SUTURE) ×2 IMPLANT
SUT VICRYL 0 UR6 27IN ABS (SUTURE) IMPLANT
TOWEL OR 17X24 6PK STRL BLUE (TOWEL DISPOSABLE) ×2 IMPLANT
TOWEL OR 17X26 10 PK STRL BLUE (TOWEL DISPOSABLE) ×2 IMPLANT
TRAY LAPAROSCOPIC (CUSTOM PROCEDURE TRAY) ×2 IMPLANT
TROCAR XCEL BLUNT TIP 100MML (ENDOMECHANICALS) ×2 IMPLANT
TROCAR XCEL NON-BLD 5MMX100MML (ENDOMECHANICALS) ×2 IMPLANT
WATER STERILE IRR 1000ML POUR (IV SOLUTION) IMPLANT

## 2011-02-25 NOTE — Progress Notes (Signed)
All checklist data reviewed and confirmed with Patient in holding area.

## 2011-02-25 NOTE — Transfer of Care (Signed)
Immediate Anesthesia Transfer of Care Note  [name]  Procedure(s) Performed:  LAPAROSCOPIC CHOLECYSTECTOMY WITH INTRAOPERATIVE CHOLANGIOGRAM  Patient Location: PACU  Anesthesia Type: General  Level of Consciousness: awake and alert   Airway & Oxygen Therapy: Patient Spontanous Breathing and Patient connected to nasal cannula oxygen  Post-op Assessment: Report given to PACU RN, Post -op Vital signs reviewed and stable and Patient moving all extremities X 4  Post vital signs: Reviewed and stable  Complications: No apparent anesthesia complications

## 2011-02-25 NOTE — Addendum Note (Signed)
Addendum  created 02/25/11 1107 by Riley Nearing   Modules edited:Anesthesia Medication Administration

## 2011-02-25 NOTE — Preoperative (Signed)
Beta Blockers   Reason not to administer Beta Blockers:Not Applicable 

## 2011-02-25 NOTE — OR Nursing (Signed)
Procedure Performed Is Laparoscopic Cholecystectomy

## 2011-02-25 NOTE — Transfer of Care (Signed)
Immediate Anesthesia Transfer of Care Note  [name]  Procedure(s) Performed:  LAPAROSCOPIC CHOLECYSTECTOMY WITH INTRAOPERATIVE CHOLANGIOGRAM  Patient Location: PACU  Anesthesia Type: General  Level of Consciousness: sedated  Airway & Oxygen Therapy: Patient Spontanous Breathing and Patient connected to nasal cannula oxygen  Post-op Assessment: Report given to PACU RN, Post -op Vital signs reviewed and stable and Patient moving all extremities X 4  Post vital signs: stable  Complications: No apparent anesthesia complications

## 2011-02-25 NOTE — Anesthesia Preprocedure Evaluation (Addendum)
Anesthesia Evaluation  Patient identified by MRN, date of birth, ID band Patient awake    Reviewed: Allergy & Precautions, H&P , NPO status , Patient's Chart, lab work & pertinent test results, reviewed documented beta blocker date and time   Airway Mallampati: I TM Distance: >3 FB Neck ROM: full    Dental  (+) Teeth Intact and Poor Dentition   Pulmonary shortness of breath,    Pulmonary exam normal       Cardiovascular hypertension, Pt. on medications + CAD and + Past MI regular Normal    Neuro/Psych Negative Neurological ROS     GI/Hepatic Neg liver ROS, GERD-  Controlled,  Endo/Other  Diabetes mellitus-  Renal/GU negative Renal ROS  Genitourinary negative   Musculoskeletal   Abdominal   Peds  Hematology negative hematology ROS (+)   Anesthesia Other Findings   Reproductive/Obstetrics                          Anesthesia Physical Anesthesia Plan  ASA: III  Anesthesia Plan: General   Post-op Pain Management:    Induction:   Airway Management Planned: Oral ETT  Additional Equipment:   Intra-op Plan:   Post-operative Plan: Extubation in OR  Informed Consent: I have reviewed the patients History and Physical, chart, labs and discussed the procedure including the risks, benefits and alternatives for the proposed anesthesia with the patient or authorized representative who has indicated his/her understanding and acceptance.   Dental advisory given and History available from chart only  Plan Discussed with: Anesthesiologist, CRNA and Surgeon  Anesthesia Plan Comments:        Anesthesia Quick Evaluation

## 2011-02-25 NOTE — H&P (View-Only) (Signed)
Patient ID: [name], female   DOB: Aug 06, 1933, 77 y.o.   MRN: [id number]  Chief Complaint  Patient presents with  . Pre-op Exam    Gallbladder sx sch 02/25/11    HPI [name] is a 77 y.o. female.   HPI  Background: 84 year old obese Caucasian female who I initially met in the hospital in late June 2012 with abdominal pain in the right upper abdomen. She was found to have acute acalculous cholecystitis. There were no stones demonstrated on ultrasound or CT imaging. She was initially planned to have a laparoscopic cholecystectomy however she developed chest pressure which prompted an EKG as well as cardiac enzymes. She was found to have a non-ST elevated myocardial infarction. Dr. Einar Gip performed a cardiac catheterization in place several drug-eluting stents into her left anterior descending, proximal descending left anterior and diagonal branch of her LAD. Because of the myocardial infarction she underwent placement of a percutaneous cholecystostomy tube on June 30. She was discharged from the hospital on July 4. I last saw her in the office 01/21/11.   She had a f/u cholangiogram thru her percutaneous cholecystostomy tube on 8/20 which demonstrated patency of her cystic duct and CBD. Since she had no gallstones, we removed her cholecystostomy tube that day. Unfortunately, she developed recurrent epigastric and right upper quadrant pain prompting her to go to the emergency room on September 7. She was readmitted and had her percutaneous cholecystostomy tube replaced on September 9. She was in the hospital until September 21. She grew out Escherichia coli from her gallbladder aspiration. She became confused while in the hospital which prompted her prolonged stay. She comes in today for followup.  She denies any fevers, chills, nausea, vomiting, chest pain, chest pressure, shortness of breath, paroxysmal nocturnal dyspnea, and/or orthopnea. She has had some intermittent loose stool.  She has been able to increase her walking without getting short of breath. She states that she walked 3 laps in the pulmonologist office earlier today and her oxygen saturation remained above 94%. She states that she feels a lot better now that the metformin has been discontinued. She denies any jaundice. She reports a good appetite.  Past Medical History  Diagnosis Date  . Diabetes mellitus   . Hypertension   . GERD (gastroesophageal reflux disease)   . Dyslipidemia   . Coronary artery disease   . Hypokalemia   . Acalculous cholecystitis   . Myocardial infarction   . Dizzy   . Fatigue     Past Surgical History  Procedure Date  . Parathyroidectomy     tumor excision  . Abdominal hysterectomy   . Shoulder surgery     right  . Cataract extraction     2  . Coronary angioplasty with stent placement 09/24/10    drug eluting stents    Family History  Problem Relation Age of Onset  . Heart disease Father   . Heart disease Brother   . Melanoma Son   . Cancer Maternal Grandfather     Social History History  Substance Use Topics  . Smoking status: Never Smoker   . Smokeless tobacco: Never Used  . Alcohol Use: Yes    Allergies  Allergen Reactions  . Dilaudid (Hydromorphone Hcl) Other (See Comments)    Caused patient psychological disturbances   . Sulfur Rash    Current Outpatient Prescriptions  Medication Sig Dispense Refill  . aspirin 81 MG tablet Take 81 mg by mouth daily.        Marland Kitchen  brimonidine-timolol (COMBIGAN) 0.2-0.5 % ophthalmic solution Place 1 drop into the left eye every 12 (twelve) hours.        Marland Kitchen esomeprazole (NEXIUM) 40 MG capsule Take 40 mg by mouth daily before breakfast.        . methocarbamol (ROBAXIN) 500 MG tablet Take 500-1,000 mg by mouth every 6 (six) hours as needed. For spasm.      . metoprolol tartrate (LOPRESSOR) 25 MG tablet Take 12.5 mg by mouth 2 (two) times daily. 1/2 tablet twice daily      . Polyethyl Glycol-Propyl Glycol (SYSTANE ULTRA  OP) Apply 2-3 drops to eye daily as needed. For dry eyes.      . rosuvastatin (CRESTOR) 5 MG tablet Take 5 mg by mouth daily.        . Ticagrelor (BRILINTA) 90 MG TABS tablet Take 90 mg by mouth 2 (two) times daily.        . valsartan (DIOVAN) 320 MG tablet Take 160 mg by mouth daily.         Review of Systems Review of Systems A comprehensive 12 points ROS was performed. All systems are negative except for what is mentioned in the HPI  Blood pressure 146/82, pulse 68, temperature 96.9 F (36.1 C), temperature source Temporal, resp. rate 16, height 5\' 3"  (1.6 m), weight 198 lb 6.4 oz (89.994 kg).  Physical Exam Physical Exam  Vitals reviewed. Constitutional: She is oriented to person, place, and time. She appears well-developed and well-nourished. No distress.       obese  HENT:  Head: Normocephalic and atraumatic.  Eyes: Conjunctivae are normal. No scleral icterus.  Neck: Normal range of motion. Neck supple. No JVD present.  Cardiovascular: Normal rate, regular rhythm, normal heart sounds and intact distal pulses.   No murmur heard. Pulmonary/Chest: Effort normal and breath sounds normal. No respiratory distress. She has no wheezes.  Abdominal: Soft. Bowel sounds are normal. She exhibits no distension. There is no tenderness. There is no rebound.       Perc drain on rt side intact with bile in drainage bag.   Musculoskeletal: Normal range of motion. She exhibits no edema.  Neurological: She is alert and oriented to person, place, and time. She exhibits normal muscle tone.  Skin: Skin is warm and dry. She is not diaphoretic.       No jaundice  Psychiatric: She has a normal mood and affect. Her behavior is normal. Judgment and thought content normal.    Data Reviewed My note from 10/25 & Dr Agustina Caroli note  Assessment    Chronic acalculous cholecystitis s/p percutaneous cholecystostomy tube placement         . Hypertension  . Myocardial infarction  . Hyperlipidemia  .  Diabetes mellitus type II  . GERD (gastroesophageal reflux disease)  . Anxiety  . Depression  . Hepatic steatosis  . Pulmonary Hypertension, secondary       Plan    The patient is scheduled for Laparoscopic cholecystectomy on November 29 which will be about 11.5 weeks from her drain placement. This time should be enough for the inflammation to have resolved.  We will plan to get a cholangiogram thru her current cholecystostomy tube prior to going to the OR since I may not be able to perform one intraoperatively.    We discussed gallbladder disease. The patient was given Neurosurgeon. We discussed continued non-operative and operative management.   I discussed laparoscopic cholecystectomy with possibility of converting to open in detail.  The patient was given educational material as well as diagrams detailing the procedure.  We discussed the risks and benefits of a laparoscopic cholecystectomy including, but not limited to bleeding, infection, injury to surrounding structures such as the intestine or liver, bile leak, retained gallstones, need to convert to an open procedure, prolonged diarrhea, blood clots such as  DVT, common bile duct injury, anesthesia risks, cardiac complications and possible need for additional procedures.  We discussed the typical post-operative recovery course. I explained that the likelihood of improvement of their symptoms is good. I explained that she is higher risk for bleeding, abscess, injury to surrounding structures, and possible conversion to open procedure given her 2 bouts of cholecystitis.  We discussed the possibility of need for blood transfusion perioperatively to keep her hgb at least >8 given her MI in June 2012. I also explained that she may develop confusion again during this hospitalization but we would certainly avoid dilaudid.  Her cardiologist has cleared her for surgery and states we can stop her Brilinta 7 days prior to surgery.  I will  allow the pt to continue her Aspirin up until surgery.   We will follow up on her OSA test that is scheduled for 11/20.  Leighton Ruff. Redmond Pulling, MD, FACS General, Bariatric, & Minimally Invasive Surgery [facility], [place]         [facility] 02/16/2011, 11:36 AM

## 2011-02-25 NOTE — Interval H&P Note (Signed)
History and Physical Interval Note:  02/25/2011 7:30 AM  [name]  has presented today for surgery, with the diagnosis of CALCULOUS CHOLECYSTITIS  The various methods of treatment have been discussed with the patient and family. After consideration of risks, benefits and other options for treatment, the patient has consented to  Procedure(s): LAPAROSCOPIC CHOLECYSTECTOMY WITH INTRAOPERATIVE CHOLANGIOGRAM as a surgical intervention .  The patients' history has been reviewed, patient examined, no change in status, stable for surgery.  I have reviewed the patients' chart and labs.  Questions were answered to the patient's satisfaction.     Gayland Curry

## 2011-02-25 NOTE — Anesthesia Postprocedure Evaluation (Signed)
  Anesthesia Post-op Note  [name]  Procedure(s) Performed:  LAPAROSCOPIC CHOLECYSTECTOMY WITH INTRAOPERATIVE CHOLANGIOGRAM  Patient Location: PACU  Anesthesia Type: General  Level of Consciousness: awake, alert , oriented and patient cooperative  Airway and Oxygen Therapy: Patient Spontanous Breathing and Patient connected to nasal cannula oxygen  Post-op Pain: mild  Post-op Assessment: Post-op Vital signs reviewed, Patient's Cardiovascular Status Stable, Respiratory Function Stable, Patent Airway, No signs of Nausea or vomiting and Pain level controlled  Post-op Vital Signs: stable  Complications: No apparent anesthesia complications

## 2011-02-25 NOTE — Addendum Note (Signed)
Addendum  created 02/25/11 1126 by Warrick Parisian, MD   Modules edited:Orders

## 2011-02-25 NOTE — Op Note (Signed)
Laparoscopic Cholecystectomy Procedure Note  Indications: 77 year old obese Caucasian female who I initially met in the hospital in late June 2012 with abdominal pain in the right upper abdomen. She was found to have acute acalculous cholecystitis. There were no stones demonstrated on ultrasound or CT imaging. She was initially planned to have a laparoscopic cholecystectomy however she developed chest pressure which prompted an EKG as well as cardiac enzymes. She was found to have a non-ST elevated myocardial infarction. Dr. Einar Gip performed a cardiac catheterization and placed several drug-eluting stents into her left anterior descending, proximal descending left anterior and diagonal branch of her LAD. Because of the myocardial infarction she underwent placement of a percutaneous cholecystostomy tube on June 30. She was discharged from the hospital on July 4. I last saw her in the office 01/21/11.  She had a f/u cholangiogram thru her percutaneous cholecystostomy tube on 8/20 which demonstrated patency of her cystic duct and CBD. Since she had no gallstones, we removed her cholecystostomy tube that day. Unfortunately, she developed recurrent epigastric and right upper quadrant pain prompting her to go to the emergency room on September 7. She was readmitted and had her percutaneous cholecystostomy tube replaced on September 9. She was in the hospital until September 21. She grew out Escherichia coli from her gallbladder aspiration. She became confused while in the hospital which prompted her prolonged stay. It has been approximately 11 weeks since her drain was placed and she has been cleared for gallbladder by her physicians.   Pre-operative Diagnosis: chronic cholecystitis s/p percutaneous cholecystostomy tube placement  Post-operative Diagnosis: Same  Procedure: Laparoscopic cholecystectomy  Surgeon: Gayland Curry   Assistants: Osborn Coho, MD  Anesthesia: General endotracheal anesthesia and  0.25% marcaine   Procedure Details  The patient was seen again in the Holding Room. The risks, benefits, complications, treatment options, and expected outcomes were discussed with the patient. The possibilities of reaction to medication, pulmonary aspiration, perforation of viscus, bleeding, recurrent infection, finding a normal gallbladder, the need for additional procedures, failure to diagnose a condition, the possible need to convert to an open procedure, and creating a complication requiring transfusion or operation were discussed with the patient. The likelihood of improving the patient's symptoms with return to their baseline status is good.  The patient and/or family concurred with the proposed plan, giving informed consent. The site of surgery properly noted. The patient was taken to Operating Room, identified as [name] and the procedure verified as Laparoscopic Cholecystectomy. A Time Out was held and the above information confirmed.  Prior to the induction of general anesthesia, antibiotic prophylaxis was administered. General endotracheal anesthesia was then administered and tolerated well. After the induction, the abdomen was prepped with Chloraprep and draped in the sterile fashion. The patient was positioned in the supine position.  Local anesthetic agent was injected into the skin near the umbilicus and an incision made. We dissected down to the abdominal fascia with blunt dissection.  The fascia was incised vertically and we entered the peritoneal cavity bluntly.  A pursestring suture of 0-Vicryl was placed around the fascial opening.  The Hasson cannula was inserted and secured with the stay suture.  Pneumoperitoneum was then created with CO2 and tolerated well without any adverse changes in the patient's vital signs. An 5-mm port was placed in the subxiphoid position.  Two 5-mm ports were placed in the right upper quadrant. All skin incisions were infiltrated with a local  anesthetic agent before making the incision and placing the  trocars.   We positioned the patient in reverse Trendelenburg, tilted slightly to the patient's left.  The gallbladder was identified. There was a fair amount of thin omental adhesions to the gallbladder which were stripped down with and without electrocautery, the fundus grasped and retracted cephalad. The percutaneous drain was cut and removed. Adhesions were lysed bluntly and with the electrocautery where indicated, taking care not to injure any adjacent organs or viscus. The infundibulum was grasped and retracted laterally, exposing the peritoneum overlying the triangle of Calot. The peritoneum was thickened but we were able to dissect it off. This was then divided and exposed in a blunt fashion. A critical view of the cystic duct and cystic artery was obtained.  The cystic duct was clearly identified and bluntly dissected circumferentially. The cystic duct was ligated with a clip distally. .   The cystic duct was then ligated with 3 clips on the biliary side and divided. The cystic artery was identified, dissected free, ligated with clips and divided as well.   The gallbladder was dissected from the liver bed in retrograde fashion with the electrocautery. The gallbladder was removed and placed in an Endocatch sac. The liver bed was irrigated and inspected. Hemostasis was achieved with the electrocautery. Copious irrigation was utilized and was repeatedly aspirated until clear.  The gallbladder and Endocatch sac were then removed through the umbilical port site.  The pursestring suture was used to close the umbilical fascia.    We again inspected the right upper quadrant for hemostasis.  Pneumoperitoneum was released as we removed the trocars.  4-0 Monocryl was used to close the skin.   Benzoin, steri-strips, and clean dressings were applied. The patient was then extubated and brought to the recovery room in stable condition. Instrument, sponge,  and needle counts were correct at closure and at the conclusion of the case.   Findings: Chronic Cholecystitis without Cholelithiasis  Estimated Blood Loss: Minimal         Drains: none         Specimens: Gallbladder           Complications: None; patient tolerated the procedure well.         Disposition: PACU - hemodynamically stable.         Condition: stable

## 2011-02-26 ENCOUNTER — Encounter (INDEPENDENT_AMBULATORY_CARE_PROVIDER_SITE_OTHER): Payer: Medicare Other | Admitting: General Surgery

## 2011-02-26 ENCOUNTER — Telehealth (INDEPENDENT_AMBULATORY_CARE_PROVIDER_SITE_OTHER): Payer: Self-pay | Admitting: General Surgery

## 2011-02-26 ENCOUNTER — Encounter (HOSPITAL_COMMUNITY): Payer: Medicare Other

## 2011-02-26 ENCOUNTER — Encounter (HOSPITAL_COMMUNITY): Payer: Self-pay | Admitting: General Surgery

## 2011-02-26 DIAGNOSIS — Z9049 Acquired absence of other specified parts of digestive tract: Secondary | ICD-10-CM

## 2011-02-26 LAB — BASIC METABOLIC PANEL
Calcium: 7.9 mg/dL — ABNORMAL LOW (ref 8.4–10.5)
Chloride: 110 mEq/L (ref 96–112)
Creatinine, Ser: 0.99 mg/dL (ref 0.50–1.10)
GFR calc Af Amer: 63 mL/min — ABNORMAL LOW (ref >90)
GFR calc non Af Amer: 55 mL/min — ABNORMAL LOW (ref >90)

## 2011-02-26 LAB — CBC
HCT: 30.7 % — ABNORMAL LOW (ref 36.0–46.0)
MCHC: 32.6 g/dL (ref 30.0–36.0)
Platelets: 202 10*3/uL (ref 150–400)
RDW: 15.8 % — ABNORMAL HIGH (ref 11.5–15.5)
WBC: 10.6 10*3/uL — ABNORMAL HIGH (ref 4.0–10.5)

## 2011-02-26 MED ORDER — OXYCODONE-ACETAMINOPHEN 5-325 MG PO TABS: 1.0000 | ORAL_TABLET | ORAL | Status: AC | PRN

## 2011-02-26 NOTE — Discharge Summary (Signed)
Physician Discharge Summary  Patient ID: DIANY GARVER MRN: [id number] DOB/AGE: Dec 10, 1933 77 y.o.  Admit date: 02/25/2011 Discharge date: 02/26/2011  Admission Diagnoses:     . Acalculous cholecystitis s/p cholecystostomy tube placement  . Hypertension  . Myocardial infarction  . Hyperlipidemia  . Diabetes mellitus type II  . GERD (gastroesophageal reflux disease)  . Anxiety  . Depression  . Hepatic steatosis  . Pulmonary Hypertension, secondary        Discharge Diagnoses:  Active Problems:  Acalculous cholecystitis s/p cholecystostomy tube placement  Hypertension  S/P laparoscopic cholecystectomy   Discharged Condition: good  Hospital Course: The patient underwent a delayed laparoscopic cholecystectomy for chronic cholecystitis s/p percutaneous cholecystostomy tube placement 11 weeks ago.  Postoperative course unremarkable.    Consults: none  Significant Diagnostic Studies: labs:  Discharge hgb 10  Treatments: IV hydration, antibiotics: cefoxitin, analgesia: Morphine and percocet, cardiac meds: ARB and B-blocker, respiratory therapy: O2 and incentive spirometry and surgery: Laparoscopic Cholecystectomy 11/29  Discharge Exam: Blood pressure 104/50, pulse 73, temperature 97.7 F (36.5 C), temperature source Axillary, resp. rate 20, height 5\' 3"  (1.6 m), weight 200 lb (90.719 kg), SpO2 95.00%. General appearance: alert, cooperative, no distress and moderately obese Eyes: clear, no icterus Resp: clear to auscultation bilaterally Cardio: regular rate and rhythm GI: soft, non-tender; bowel sounds normal; no masses,  no organomegaly and  Extremities: extremities normal, atraumatic, no cyanosis or edema Incision/Wound: dressings c/d/i  Disposition: Home-Health Care Svc  Discharge Orders    Future Appointments: Provider: Department: Dept Phone: Center:   03/19/2011 1:45 PM Collene Gobble, MD [facility] [phone] None     Future Orders Please Complete  By Expires   Diet - low sodium heart healthy      Increase activity slowly      Driving Restrictions      Comments:   No driving while on pain medication or until after Monday   Lifting restrictions      Comments:   Do not lift anything >15 pounds for 2 weeks   Discharge wound care:      Comments:   See instructions below   Remove dressing in 24 hours        Current Discharge Medication List    START taking these medications   Details  oxyCODONE-acetaminophen (PERCOCET) 5-325 MG per tablet Take 1-2 tablets by mouth every 4 (four) hours as needed. Qty: 40 tablet, Refills: 0      CONTINUE these medications which have NOT CHANGED   Details  aspirin 81 MG tablet Take 81 mg by mouth daily.      brimonidine-timolol (COMBIGAN) 0.2-0.5 % ophthalmic solution Place 1 drop into the left eye every 12 (twelve) hours.      esomeprazole (NEXIUM) 40 MG capsule Take 40 mg by mouth daily before breakfast.      methocarbamol (ROBAXIN) 500 MG tablet Take 500-1,000 mg by mouth every 6 (six) hours as needed. For spasm.    metoprolol tartrate (LOPRESSOR) 25 MG tablet Take 12.5 mg by mouth 2 (two) times daily. 1/2 tablet twice daily    Polyethyl Glycol-Propyl Glycol (SYSTANE ULTRA OP) Apply 2-3 drops to eye daily as needed. For dry eyes.    rosuvastatin (CRESTOR) 5 MG tablet Take 5 mg by mouth daily.      valsartan (DIOVAN) 320 MG tablet Take 160 mg by mouth daily.     Ticagrelor (BRILINTA) 90 MG TABS tablet Take 90 mg by mouth 2 (two) times daily.  Follow-up Information    Follow up with Gayland Curry, MD. Make an appointment in 2 weeks.   Contact information:   [employer], [place], [place] Redmond Pulling, MD, FACS General, Bariatric, & Minimally Invasive Surgery [facility], [place]   Signed: Gayland Curry 02/26/2011, 1:25 PM

## 2011-02-27 ENCOUNTER — Telehealth (INDEPENDENT_AMBULATORY_CARE_PROVIDER_SITE_OTHER): Payer: Self-pay | Admitting: General Surgery

## 2011-02-27 NOTE — Telephone Encounter (Signed)
Called c/o of fluid retention and dyspnea (mild) 2 days post op lap chole. Advised to go to ED or call Dr. Einar Gip, her cardiologist.  No GI symptoms.  History CAD.  Adin Hector 02/27/2011 8:55 PM

## 2011-03-01 ENCOUNTER — Encounter (HOSPITAL_COMMUNITY): Payer: Medicare Other

## 2011-03-01 NOTE — Telephone Encounter (Signed)
Appt made 03/12/11 at 11:00 am. Patient had some trouble breathing on Saturday and contacted Dr Einar Gip who prescribed her a fluid pill and she is follow up with him today. Her breathing is better. She is sore today but other than that she is doing well. Barely taking pain medicine. She is instructed to call us if she has any problems prior to next week.

## 2011-03-03 ENCOUNTER — Encounter (HOSPITAL_COMMUNITY): Payer: Medicare Other

## 2011-03-05 ENCOUNTER — Encounter (HOSPITAL_COMMUNITY): Payer: Medicare Other

## 2011-03-12 ENCOUNTER — Ambulatory Visit (INDEPENDENT_AMBULATORY_CARE_PROVIDER_SITE_OTHER): Payer: Medicare Other | Admitting: General Surgery

## 2011-03-12 ENCOUNTER — Encounter (INDEPENDENT_AMBULATORY_CARE_PROVIDER_SITE_OTHER): Payer: Self-pay | Admitting: General Surgery

## 2011-03-12 VITALS — BP 142/72 | HR 60 | Temp 97.4°F | Resp 16 | Ht 63.0 in | Wt 196.2 lb

## 2011-03-12 DIAGNOSIS — Z09 Encounter for follow-up examination after completed treatment for conditions other than malignant neoplasm: Secondary | ICD-10-CM

## 2011-03-12 NOTE — Progress Notes (Signed)
Chief complaint:  Postop  Procedure: Status post laparoscopic cholecystectomy November 29  History of Present Ilness: 77 year old Caucasian female comes in today for her postoperative appointment. She stayed overnight in the hospital for observation. She states that she's been doing great. She denies any fevers, chills, nausea, or vomiting. She denies any abdominal pain. She reports a great appetite. She denies any diarrhea. She denies any problems with her incisions. She did develop some fluid retention requiring the use of diuretics. Otherwise she has no complaints.  Physical Exam: BP 142/72  Pulse 60  Temp(Src) 97.4 F (36.3 C) (Temporal)  Resp 16  Ht 5\' 3"  (1.6 m)  Wt 196 lb 4 oz (89.018 kg)  BMI 34.76 kg/m2  Gen: alert, NAD, non-toxic appearing Pupils: equal, no scleral icterus Pulm: Lungs clear to auscultation, symmetric chest rise CV: regular rate and rhythm Abd: soft, nontender, nondistended. Well-healed trocar sites. No cellulitis. No incisional hernia Ext: no edema, no calf tenderness Skin: no rash, no jaundice  Pathology: Acute and chronic cholecystitis with cholelithiasis and necrosis  Assessment and Plan: Status post interval laparoscopic cholecystectomy  Doing great. We reviewed her pathology. I've release her to full activities. I will see her on as needed basis.  Leighton Ruff. Redmond Pulling, MD, FACS General, Bariatric, & Minimally Invasive Surgery [facility], [place]

## 2011-03-12 NOTE — Patient Instructions (Signed)
Can resume full activities

## 2011-03-19 ENCOUNTER — Encounter: Payer: Self-pay | Admitting: Emergency Medicine

## 2011-03-19 ENCOUNTER — Ambulatory Visit (INDEPENDENT_AMBULATORY_CARE_PROVIDER_SITE_OTHER): Payer: Medicare Other | Admitting: Emergency Medicine

## 2011-03-19 DIAGNOSIS — I2789 Other specified pulmonary heart diseases: Secondary | ICD-10-CM

## 2011-03-19 NOTE — Assessment & Plan Note (Signed)
Suspect due to to her HTN, CAD, probable OSA.  - will obtain her PSG from [facility] and plan for CPAP if indicated.  - f/u with myself and Dr Nadyne Coombes.

## 2011-03-19 NOTE — Patient Instructions (Signed)
Please continue your current medications as prescribed by Drs Ronnald Ramp We will obtain the results of your sleep study Follow with Dr Lamonte Sakai in 6 months or sooner if you have any problems

## 2011-03-19 NOTE — Progress Notes (Signed)
  Subjective:    Patient ID: [name], female    DOB: May 05, 1933, 77 y.o.   MRN: [id number]  HPI 77 yo never smoker, hx of HTN, CAD s/p PTCI 6/12, cholecystitis s/p GB drain 9/12, (plan for sgy on 11/29). She has a TTE from 12/05/10 (during the hospitalization) that showed good LV fxn, slightly enlarged LA, estimated PASP of 71mmHg. She believes that her breathing is good, still building herself back up to usual activity level. She is scheduled for a sleep study 11/20.   ROV 03/19/11 -- f/u visit for [place] discovered on TTE 12/05/10. Hx of HTN, CAD. She underwent PSG in [place] since last time, not available to me right now. She had her chole as planned. She tells me that she feels much better since her surgery. No dyspnea, no CP,       Objective:   Physical Exam  Gen: Pleasant, obese, in no distress,  normal affect  ENT: No lesions,  mouth clear,  oropharynx clear, no postnasal drip  Neck: No JVD, no TMG, no carotid bruits  Lungs: No use of accessory muscles, no dullness to percussion, clear without rales or rhonchi  Cardiovascular: RRR, heart sounds normal, no murmur or gallops, no peripheral edema  GI: GB drain in place  Musculoskeletal: No deformities, no cyanosis or clubbing  Neuro: alert, non focal  Skin: Warm, no lesions or rashes   CXR Comparison: 12/13/2010  Findings: Artifact overlies the chest. Heart size is normal.  There is improvement in the chest radiograph appearance with less  venous hypertension and better aeration in the lower lobes. No  worsening or new findings. Mild residual lower lobe volume loss.  IMPRESSION:  Some radiographic improvement. Less venous hypertension and  basilar volume loss.     Assessment & Plan:  Pulmonary Hypertension, secondary Suspect due to to her HTN, CAD, probable OSA.  - will obtain her PSG from [facility] and plan for CPAP if indicated.  - f/u with myself and Dr Nadyne Coombes.

## 2011-04-05 DIAGNOSIS — H409 Unspecified glaucoma: Secondary | ICD-10-CM | POA: Diagnosis not present

## 2011-04-05 DIAGNOSIS — H4011X0 Primary open-angle glaucoma, stage unspecified: Secondary | ICD-10-CM | POA: Diagnosis not present

## 2011-04-15 DIAGNOSIS — J029 Acute pharyngitis, unspecified: Secondary | ICD-10-CM | POA: Diagnosis not present

## 2011-05-12 DIAGNOSIS — I10 Essential (primary) hypertension: Secondary | ICD-10-CM | POA: Diagnosis not present

## 2011-05-12 DIAGNOSIS — E119 Type 2 diabetes mellitus without complications: Secondary | ICD-10-CM | POA: Diagnosis not present

## 2011-05-12 DIAGNOSIS — E789 Disorder of lipoprotein metabolism, unspecified: Secondary | ICD-10-CM | POA: Diagnosis not present

## 2011-05-14 DIAGNOSIS — I2510 Atherosclerotic heart disease of native coronary artery without angina pectoris: Secondary | ICD-10-CM | POA: Diagnosis not present

## 2011-05-14 DIAGNOSIS — Z9861 Coronary angioplasty status: Secondary | ICD-10-CM | POA: Diagnosis not present

## 2011-05-14 DIAGNOSIS — E7800 Pure hypercholesterolemia, unspecified: Secondary | ICD-10-CM | POA: Diagnosis not present

## 2011-05-14 DIAGNOSIS — I10 Essential (primary) hypertension: Secondary | ICD-10-CM | POA: Diagnosis not present

## 2011-05-17 DIAGNOSIS — E119 Type 2 diabetes mellitus without complications: Secondary | ICD-10-CM | POA: Diagnosis not present

## 2011-08-12 DIAGNOSIS — E119 Type 2 diabetes mellitus without complications: Secondary | ICD-10-CM | POA: Diagnosis not present

## 2011-08-18 DIAGNOSIS — E7800 Pure hypercholesterolemia, unspecified: Secondary | ICD-10-CM | POA: Diagnosis not present

## 2011-08-18 DIAGNOSIS — I10 Essential (primary) hypertension: Secondary | ICD-10-CM | POA: Diagnosis not present

## 2011-08-18 DIAGNOSIS — E119 Type 2 diabetes mellitus without complications: Secondary | ICD-10-CM | POA: Diagnosis not present

## 2011-10-22 ENCOUNTER — Ambulatory Visit: Payer: Medicare Other | Admitting: Emergency Medicine

## 2011-11-04 ENCOUNTER — Encounter: Payer: Self-pay | Admitting: Emergency Medicine

## 2011-11-04 ENCOUNTER — Ambulatory Visit (INDEPENDENT_AMBULATORY_CARE_PROVIDER_SITE_OTHER): Payer: Medicare Other | Admitting: Emergency Medicine

## 2011-11-04 VITALS — BP 110/72 | HR 71 | Temp 98.1°F | Ht 63.0 in | Wt 203.2 lb

## 2011-11-04 DIAGNOSIS — I2789 Other specified pulmonary heart diseases: Secondary | ICD-10-CM

## 2011-11-04 NOTE — Progress Notes (Signed)
  Subjective:    Patient ID: [name], female    DOB: [birth year], 78 y.o.   MRN: [id number] HPI 78 yo never smoker, hx of HTN, CAD s/p PTCI 6/12, cholecystitis s/p GB drain 9/12, (plan for sgy on 11/29). She has a TTE from 12/05/10 (during the hospitalization) that showed good LV fxn, slightly enlarged LA, estimated PASP of 49mmHg. She believes that her breathing is good, still building herself back up to usual activity level. She is scheduled for a sleep study 11/20.   ROV 03/19/11 -- f/u visit for [place] discovered on TTE 12/05/10. Hx of HTN, CAD. She underwent PSG in [place] since last time, not available to me right now. She had her chole as planned. She tells me that she feels much better since her surgery. No dyspnea, no CP,   ROV 11/04/11 -- PAH discovered on TTE 12/05/10 (~PASP 21mmHg). Hx of HTN, CAD. She underwent PSG in [place] > no OSA, questioned RLS. She has gained wt since last time. Exercise tolerance is limited, fatigue with walking through a store.  No CP.       Objective:   Physical Exam Filed Vitals:   11/04/11 1426  BP: 110/72  Pulse: 71  Temp: 98.1 F (36.7 C)   Gen: Pleasant, obese, in no distress,  normal affect  ENT: No lesions,  mouth clear,  oropharynx clear, no postnasal drip  Neck: No JVD, no TMG, no carotid bruits  Lungs: No use of accessory muscles, no dullness to percussion, clear without rales or rhonchi  Cardiovascular: RRR, heart sounds normal, no murmur or gallops, no peripheral edema  GI: GB drain in place  Musculoskeletal: No deformities, no cyanosis or clubbing  Neuro: alert, non focal  Skin: Warm, no lesions or rashes   CXR Comparison: 12/13/2010  Findings: Artifact overlies the chest. Heart size is normal.  There is improvement in the chest radiograph appearance with less  venous hypertension and better aeration in the lower lobes. No  worsening or new findings. Mild residual lower lobe volume loss.  IMPRESSION:  Some  radiographic improvement. Less venous hypertension and  basilar volume loss.     Assessment & Plan:  Pulmonary Hypertension, secondary Clinically stable. PSG did not show OSA (by her report).  - no intervention at this time - repeat TTE this year when she sees Dr Nadyne Coombes to follow PASP. If rising despite good control of BP then could consider R heart cath and possible PAH therapy.

## 2011-11-04 NOTE — Assessment & Plan Note (Signed)
Clinically stable. PSG did not show OSA (by her report).  - no intervention at this time - repeat TTE this year when she sees Dr Nadyne Coombes to follow PASP. If rising despite good control of BP then could consider R heart cath and possible PAH therapy.

## 2011-11-04 NOTE — Patient Instructions (Addendum)
Please continue your blood pressure medications as directed by Drs Maudie Mercury and Hayden Pedro will need an echocardiogram this year to assess your pulmonary pressures. This can be done through Dr Mila Palmer office Follow with Dr Lamonte Sakai in 6 months or sooner if you have any problems

## 2011-11-12 DIAGNOSIS — Z9861 Coronary angioplasty status: Secondary | ICD-10-CM | POA: Diagnosis not present

## 2011-11-12 DIAGNOSIS — R0609 Other forms of dyspnea: Secondary | ICD-10-CM | POA: Diagnosis not present

## 2011-11-12 DIAGNOSIS — E7800 Pure hypercholesterolemia, unspecified: Secondary | ICD-10-CM | POA: Diagnosis not present

## 2011-11-12 DIAGNOSIS — I2510 Atherosclerotic heart disease of native coronary artery without angina pectoris: Secondary | ICD-10-CM | POA: Diagnosis not present

## 2011-12-02 DIAGNOSIS — I2789 Other specified pulmonary heart diseases: Secondary | ICD-10-CM | POA: Diagnosis not present

## 2011-12-02 DIAGNOSIS — I2510 Atherosclerotic heart disease of native coronary artery without angina pectoris: Secondary | ICD-10-CM | POA: Diagnosis not present

## 2011-12-08 DIAGNOSIS — Z8049 Family history of malignant neoplasm of other genital organs: Secondary | ICD-10-CM | POA: Diagnosis not present

## 2011-12-08 DIAGNOSIS — L719 Rosacea, unspecified: Secondary | ICD-10-CM | POA: Diagnosis not present

## 2011-12-08 DIAGNOSIS — L821 Other seborrheic keratosis: Secondary | ICD-10-CM | POA: Diagnosis not present

## 2011-12-08 DIAGNOSIS — L820 Inflamed seborrheic keratosis: Secondary | ICD-10-CM | POA: Diagnosis not present

## 2011-12-10 ENCOUNTER — Ambulatory Visit: Payer: Medicare Other | Attending: Cardiology | Admitting: Physical Therapy

## 2011-12-10 DIAGNOSIS — R262 Difficulty in walking, not elsewhere classified: Secondary | ICD-10-CM | POA: Insufficient documentation

## 2011-12-10 DIAGNOSIS — R0609 Other forms of dyspnea: Secondary | ICD-10-CM | POA: Diagnosis not present

## 2011-12-10 DIAGNOSIS — R0989 Other specified symptoms and signs involving the circulatory and respiratory systems: Secondary | ICD-10-CM | POA: Insufficient documentation

## 2011-12-10 DIAGNOSIS — R5381 Other malaise: Secondary | ICD-10-CM | POA: Diagnosis not present

## 2011-12-10 DIAGNOSIS — IMO0001 Reserved for inherently not codable concepts without codable children: Secondary | ICD-10-CM | POA: Insufficient documentation

## 2011-12-13 ENCOUNTER — Ambulatory Visit: Payer: Medicare Other | Admitting: Physical Therapy

## 2011-12-13 DIAGNOSIS — R0989 Other specified symptoms and signs involving the circulatory and respiratory systems: Secondary | ICD-10-CM | POA: Diagnosis not present

## 2011-12-13 DIAGNOSIS — IMO0001 Reserved for inherently not codable concepts without codable children: Secondary | ICD-10-CM | POA: Diagnosis not present

## 2011-12-13 DIAGNOSIS — R262 Difficulty in walking, not elsewhere classified: Secondary | ICD-10-CM | POA: Diagnosis not present

## 2011-12-13 DIAGNOSIS — R5381 Other malaise: Secondary | ICD-10-CM | POA: Diagnosis not present

## 2011-12-15 ENCOUNTER — Ambulatory Visit: Payer: Medicare Other | Admitting: Physical Therapy

## 2011-12-15 DIAGNOSIS — R5381 Other malaise: Secondary | ICD-10-CM | POA: Diagnosis not present

## 2011-12-15 DIAGNOSIS — R0609 Other forms of dyspnea: Secondary | ICD-10-CM | POA: Diagnosis not present

## 2011-12-15 DIAGNOSIS — R262 Difficulty in walking, not elsewhere classified: Secondary | ICD-10-CM | POA: Diagnosis not present

## 2011-12-15 DIAGNOSIS — IMO0001 Reserved for inherently not codable concepts without codable children: Secondary | ICD-10-CM | POA: Diagnosis not present

## 2011-12-20 ENCOUNTER — Ambulatory Visit: Payer: Medicare Other | Admitting: Physical Therapy

## 2011-12-20 DIAGNOSIS — R5381 Other malaise: Secondary | ICD-10-CM | POA: Diagnosis not present

## 2011-12-20 DIAGNOSIS — IMO0001 Reserved for inherently not codable concepts without codable children: Secondary | ICD-10-CM | POA: Diagnosis not present

## 2011-12-20 DIAGNOSIS — R262 Difficulty in walking, not elsewhere classified: Secondary | ICD-10-CM | POA: Diagnosis not present

## 2011-12-20 DIAGNOSIS — R0989 Other specified symptoms and signs involving the circulatory and respiratory systems: Secondary | ICD-10-CM | POA: Diagnosis not present

## 2011-12-21 DIAGNOSIS — I2510 Atherosclerotic heart disease of native coronary artery without angina pectoris: Secondary | ICD-10-CM | POA: Diagnosis not present

## 2011-12-21 DIAGNOSIS — R0602 Shortness of breath: Secondary | ICD-10-CM | POA: Diagnosis not present

## 2011-12-22 ENCOUNTER — Ambulatory Visit: Payer: Medicare Other | Admitting: Physical Therapy

## 2011-12-22 DIAGNOSIS — R5381 Other malaise: Secondary | ICD-10-CM | POA: Diagnosis not present

## 2011-12-22 DIAGNOSIS — R262 Difficulty in walking, not elsewhere classified: Secondary | ICD-10-CM | POA: Diagnosis not present

## 2011-12-22 DIAGNOSIS — IMO0001 Reserved for inherently not codable concepts without codable children: Secondary | ICD-10-CM | POA: Diagnosis not present

## 2011-12-22 DIAGNOSIS — R0989 Other specified symptoms and signs involving the circulatory and respiratory systems: Secondary | ICD-10-CM | POA: Diagnosis not present

## 2011-12-23 ENCOUNTER — Encounter: Payer: Medicare Other | Admitting: Physical Therapy

## 2011-12-24 ENCOUNTER — Ambulatory Visit: Payer: Medicare Other | Admitting: Physical Therapy

## 2011-12-24 DIAGNOSIS — IMO0001 Reserved for inherently not codable concepts without codable children: Secondary | ICD-10-CM | POA: Diagnosis not present

## 2011-12-24 DIAGNOSIS — R0989 Other specified symptoms and signs involving the circulatory and respiratory systems: Secondary | ICD-10-CM | POA: Diagnosis not present

## 2011-12-24 DIAGNOSIS — R5381 Other malaise: Secondary | ICD-10-CM | POA: Diagnosis not present

## 2011-12-24 DIAGNOSIS — R262 Difficulty in walking, not elsewhere classified: Secondary | ICD-10-CM | POA: Diagnosis not present

## 2011-12-27 ENCOUNTER — Ambulatory Visit: Payer: Medicare Other | Admitting: Physical Therapy

## 2011-12-27 DIAGNOSIS — R5381 Other malaise: Secondary | ICD-10-CM | POA: Diagnosis not present

## 2011-12-27 DIAGNOSIS — R0609 Other forms of dyspnea: Secondary | ICD-10-CM | POA: Diagnosis not present

## 2011-12-27 DIAGNOSIS — IMO0001 Reserved for inherently not codable concepts without codable children: Secondary | ICD-10-CM | POA: Diagnosis not present

## 2011-12-27 DIAGNOSIS — R262 Difficulty in walking, not elsewhere classified: Secondary | ICD-10-CM | POA: Diagnosis not present

## 2011-12-29 ENCOUNTER — Ambulatory Visit: Payer: Medicare Other | Attending: Cardiology | Admitting: Physical Therapy

## 2011-12-29 DIAGNOSIS — R0989 Other specified symptoms and signs involving the circulatory and respiratory systems: Secondary | ICD-10-CM | POA: Insufficient documentation

## 2011-12-29 DIAGNOSIS — R262 Difficulty in walking, not elsewhere classified: Secondary | ICD-10-CM | POA: Diagnosis not present

## 2011-12-29 DIAGNOSIS — R5381 Other malaise: Secondary | ICD-10-CM | POA: Diagnosis not present

## 2011-12-29 DIAGNOSIS — R0609 Other forms of dyspnea: Secondary | ICD-10-CM | POA: Insufficient documentation

## 2011-12-29 DIAGNOSIS — IMO0001 Reserved for inherently not codable concepts without codable children: Secondary | ICD-10-CM | POA: Diagnosis not present

## 2011-12-31 ENCOUNTER — Ambulatory Visit: Payer: Medicare Other | Admitting: Physical Therapy

## 2012-01-03 ENCOUNTER — Ambulatory Visit: Payer: Medicare Other | Admitting: Physical Therapy

## 2012-01-05 ENCOUNTER — Ambulatory Visit: Payer: Medicare Other | Admitting: Physical Therapy

## 2012-01-07 ENCOUNTER — Ambulatory Visit: Payer: Medicare Other | Admitting: Physical Therapy

## 2012-01-10 ENCOUNTER — Ambulatory Visit: Payer: Medicare Other | Admitting: Physical Therapy

## 2012-01-12 ENCOUNTER — Ambulatory Visit: Payer: Medicare Other | Admitting: Physical Therapy

## 2012-01-13 ENCOUNTER — Ambulatory Visit: Payer: Medicare Other | Admitting: Physical Therapy

## 2012-01-14 ENCOUNTER — Encounter: Payer: Medicare Other | Admitting: Physical Therapy

## 2012-01-17 ENCOUNTER — Ambulatory Visit: Payer: Medicare Other | Admitting: Physical Therapy

## 2012-01-19 ENCOUNTER — Ambulatory Visit: Payer: Medicare Other | Admitting: Physical Therapy

## 2012-01-21 ENCOUNTER — Ambulatory Visit: Payer: Medicare Other | Admitting: Physical Therapy

## 2012-01-24 ENCOUNTER — Ambulatory Visit: Payer: Medicare Other | Admitting: Physical Therapy

## 2012-01-25 ENCOUNTER — Ambulatory Visit: Payer: Medicare Other | Admitting: Physical Therapy

## 2012-01-27 ENCOUNTER — Ambulatory Visit: Payer: Medicare Other | Admitting: Physical Therapy

## 2012-01-31 ENCOUNTER — Ambulatory Visit: Payer: Medicare Other | Attending: Cardiology | Admitting: Physical Therapy

## 2012-01-31 DIAGNOSIS — R0989 Other specified symptoms and signs involving the circulatory and respiratory systems: Secondary | ICD-10-CM | POA: Diagnosis not present

## 2012-01-31 DIAGNOSIS — R262 Difficulty in walking, not elsewhere classified: Secondary | ICD-10-CM | POA: Insufficient documentation

## 2012-01-31 DIAGNOSIS — R5381 Other malaise: Secondary | ICD-10-CM | POA: Diagnosis not present

## 2012-01-31 DIAGNOSIS — IMO0001 Reserved for inherently not codable concepts without codable children: Secondary | ICD-10-CM | POA: Insufficient documentation

## 2012-01-31 DIAGNOSIS — R0609 Other forms of dyspnea: Secondary | ICD-10-CM | POA: Insufficient documentation

## 2012-02-01 ENCOUNTER — Encounter: Payer: Medicare Other | Admitting: Physical Therapy

## 2012-02-01 DIAGNOSIS — H4011X0 Primary open-angle glaucoma, stage unspecified: Secondary | ICD-10-CM | POA: Diagnosis not present

## 2012-02-01 DIAGNOSIS — H43399 Other vitreous opacities, unspecified eye: Secondary | ICD-10-CM | POA: Diagnosis not present

## 2012-02-03 ENCOUNTER — Ambulatory Visit: Payer: Medicare Other | Admitting: Physical Therapy

## 2012-02-03 DIAGNOSIS — IMO0001 Reserved for inherently not codable concepts without codable children: Secondary | ICD-10-CM | POA: Diagnosis not present

## 2012-02-03 DIAGNOSIS — R262 Difficulty in walking, not elsewhere classified: Secondary | ICD-10-CM | POA: Diagnosis not present

## 2012-02-03 DIAGNOSIS — R5381 Other malaise: Secondary | ICD-10-CM | POA: Diagnosis not present

## 2012-02-03 DIAGNOSIS — R0609 Other forms of dyspnea: Secondary | ICD-10-CM | POA: Diagnosis not present

## 2012-02-04 ENCOUNTER — Ambulatory Visit: Payer: Medicare Other | Admitting: Physical Therapy

## 2012-02-07 ENCOUNTER — Encounter: Payer: Medicare Other | Admitting: Physical Therapy

## 2012-02-08 ENCOUNTER — Ambulatory Visit: Payer: Medicare Other | Admitting: Physical Therapy

## 2012-02-10 ENCOUNTER — Ambulatory Visit: Payer: Medicare Other | Admitting: Physical Therapy

## 2012-02-10 DIAGNOSIS — R262 Difficulty in walking, not elsewhere classified: Secondary | ICD-10-CM | POA: Diagnosis not present

## 2012-02-10 DIAGNOSIS — IMO0001 Reserved for inherently not codable concepts without codable children: Secondary | ICD-10-CM | POA: Diagnosis not present

## 2012-02-10 DIAGNOSIS — R5381 Other malaise: Secondary | ICD-10-CM | POA: Diagnosis not present

## 2012-02-10 DIAGNOSIS — R0609 Other forms of dyspnea: Secondary | ICD-10-CM | POA: Diagnosis not present

## 2012-02-29 DIAGNOSIS — E119 Type 2 diabetes mellitus without complications: Secondary | ICD-10-CM | POA: Diagnosis not present

## 2012-02-29 DIAGNOSIS — I10 Essential (primary) hypertension: Secondary | ICD-10-CM | POA: Diagnosis not present

## 2012-03-03 ENCOUNTER — Encounter: Payer: Self-pay | Admitting: Internal Medicine

## 2012-03-03 DIAGNOSIS — I2510 Atherosclerotic heart disease of native coronary artery without angina pectoris: Secondary | ICD-10-CM | POA: Diagnosis not present

## 2012-03-03 DIAGNOSIS — I10 Essential (primary) hypertension: Secondary | ICD-10-CM | POA: Diagnosis not present

## 2012-03-03 DIAGNOSIS — Z23 Encounter for immunization: Secondary | ICD-10-CM | POA: Diagnosis not present

## 2012-03-03 DIAGNOSIS — E669 Obesity, unspecified: Secondary | ICD-10-CM | POA: Diagnosis not present

## 2012-03-03 DIAGNOSIS — M25559 Pain in unspecified hip: Secondary | ICD-10-CM | POA: Diagnosis not present

## 2012-03-03 DIAGNOSIS — M47817 Spondylosis without myelopathy or radiculopathy, lumbosacral region: Secondary | ICD-10-CM | POA: Diagnosis not present

## 2012-03-03 DIAGNOSIS — M549 Dorsalgia, unspecified: Secondary | ICD-10-CM | POA: Diagnosis not present

## 2012-03-03 DIAGNOSIS — M47814 Spondylosis without myelopathy or radiculopathy, thoracic region: Secondary | ICD-10-CM | POA: Diagnosis not present

## 2012-04-04 IMAGING — CT CT ABD-PELV W/ CM
2 of 5 series · 16 of 46 positions shown, 18 images · IV contrast (APPLIED)
Comparison: 09/23/2010 CT [HOSPITAL].

CLINICAL DATA: Chest pain and abdominal pain.  Nausea and vomiting.
Prior cholecystostomy tube placement for acalculous cholecystitis
09/25/2010 with exchange of to 11/16/2010.

CT ABDOMEN AND PELVIS WITH CONTRAST
TECHNIQUE: Multidetector CT imaging of the abdomen and pelvis was
performed following the standard protocol during bolus
administration of intravenous contrast.
Contrast: 100mL OMNIPAQUE IOHEXOL 300 MG/ML IV SOLN

[Series 2: abd/pelv with 5.0 b31f st · axial · 0.88mm/px · z∈[-406,+40]mm · 13 of 101 slices shown, 15 images]
[im 6/101  soft-tissue]
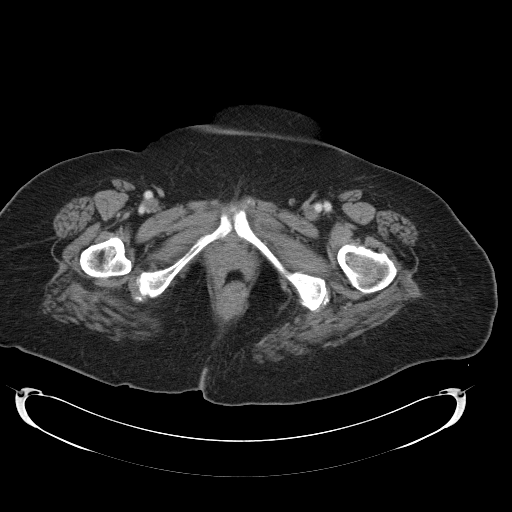
[im 6/101  bone]
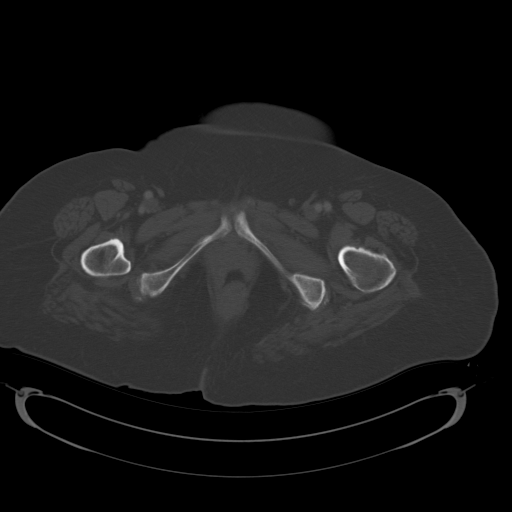
[im 16/101  soft-tissue]
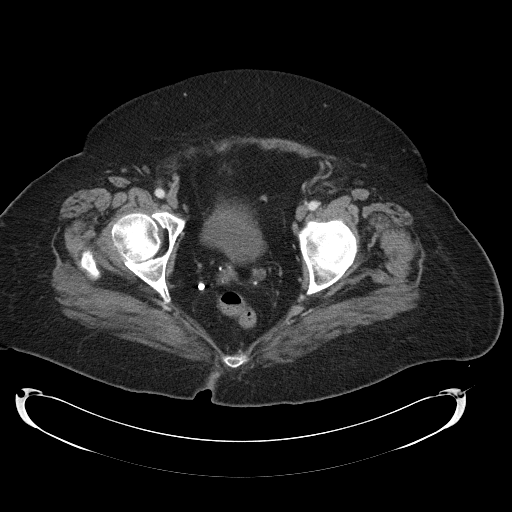
[im 22/101  soft-tissue]
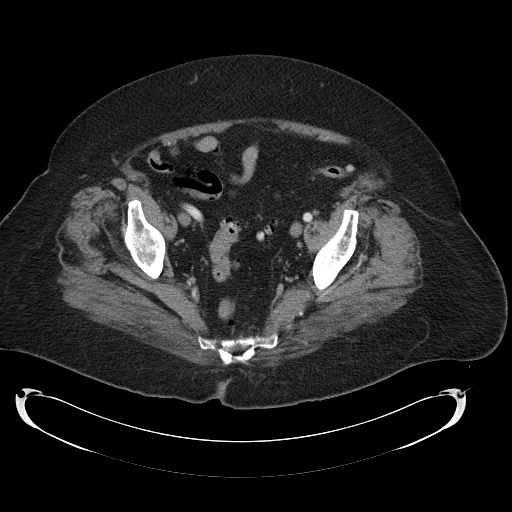
[im 27/101  soft-tissue]
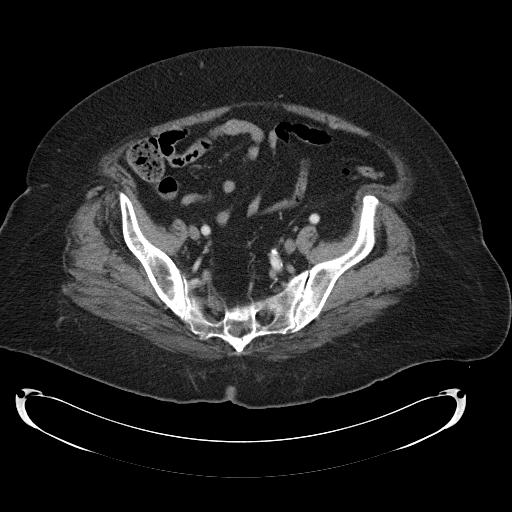
[im 37/101  soft-tissue]
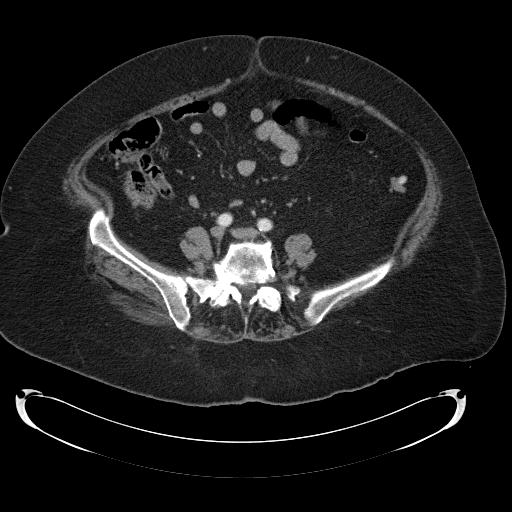
[im 43/101  soft-tissue]
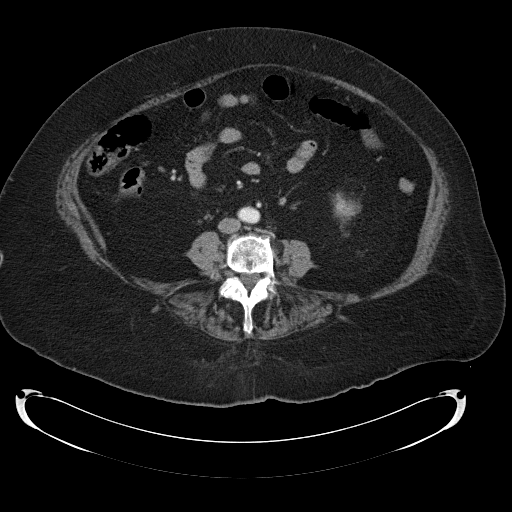
[im 53/101  soft-tissue]
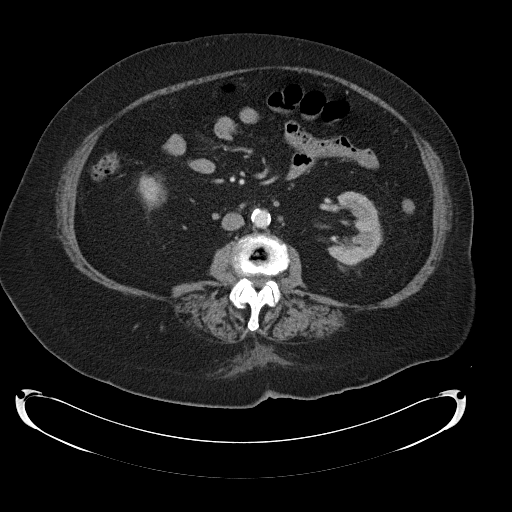
[im 58/101  soft-tissue]
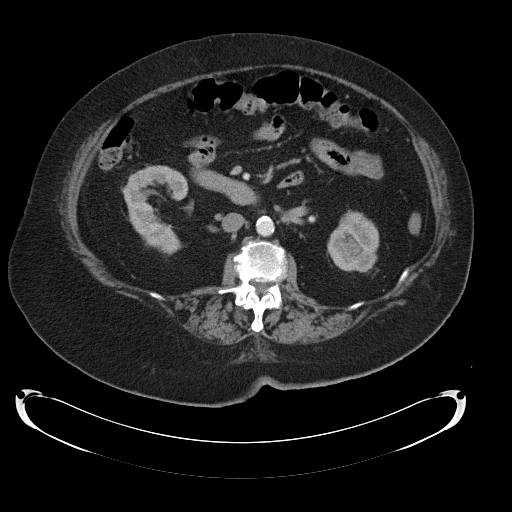
[im 64/101  soft-tissue]
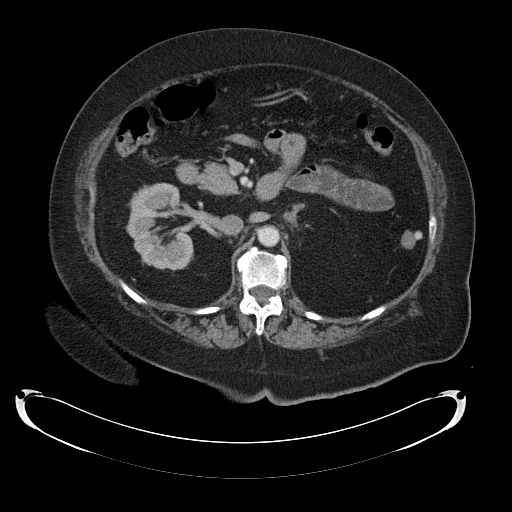
[im 64/101  bone]
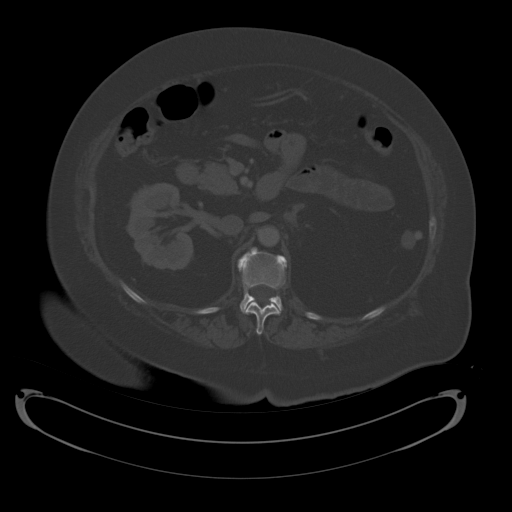
[im 74/101  soft-tissue]
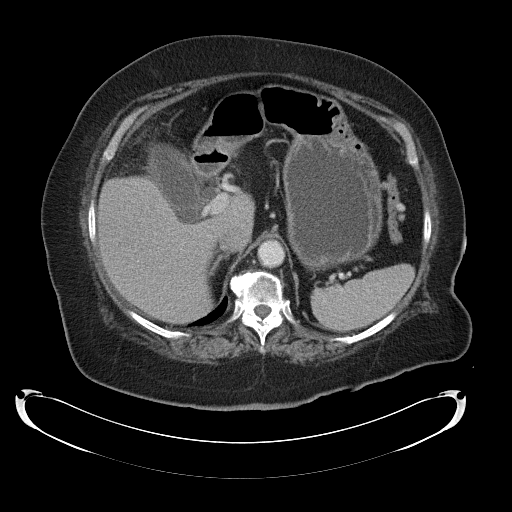
[im 79/101  soft-tissue]
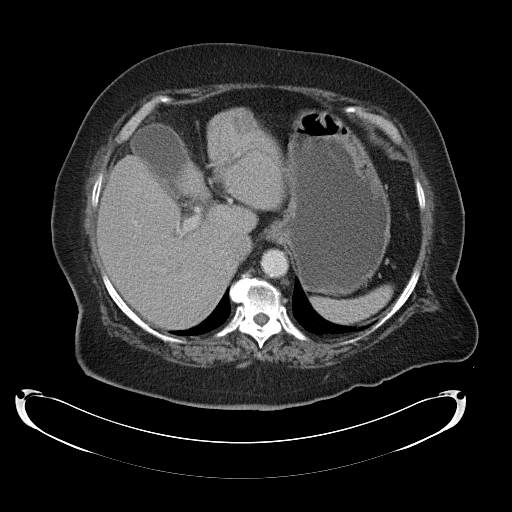
[im 85/101  soft-tissue]
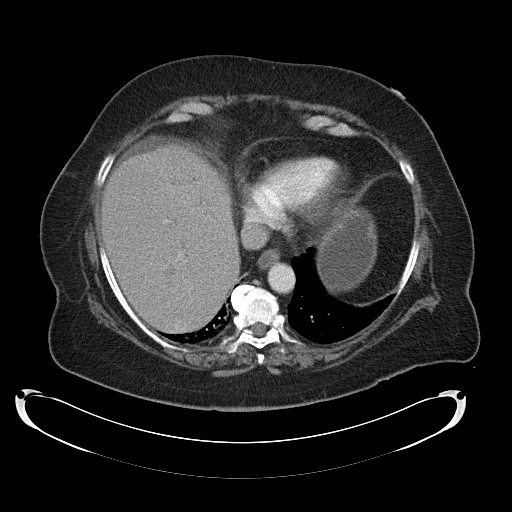
[im 95/101  soft-tissue]
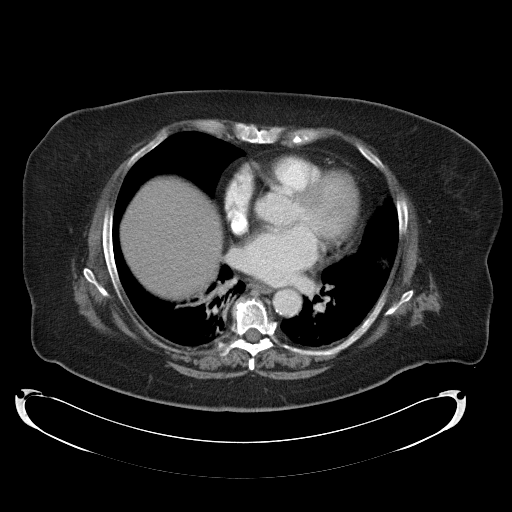

[Series 5: abd/pelv with 2.0 spo st · coronal · 0.98mm/px · 3 of 141 slices shown]
[im 47/141  soft-tissue]
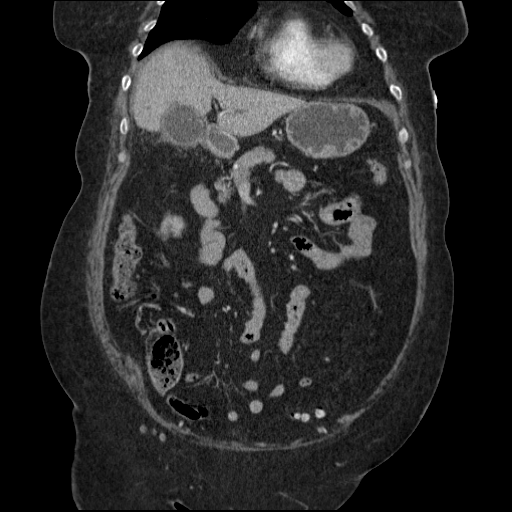
[im 63/141  soft-tissue]
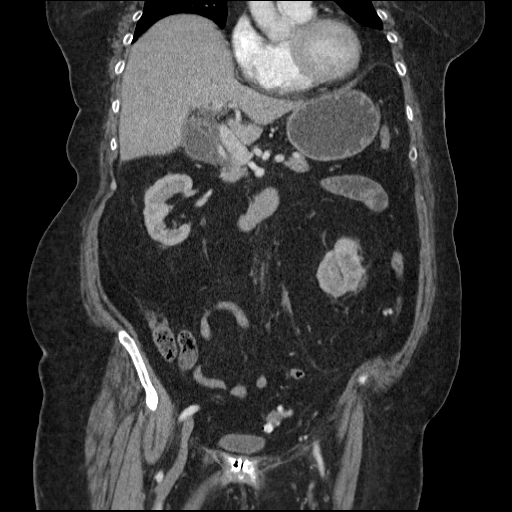
[im 78/141  soft-tissue]
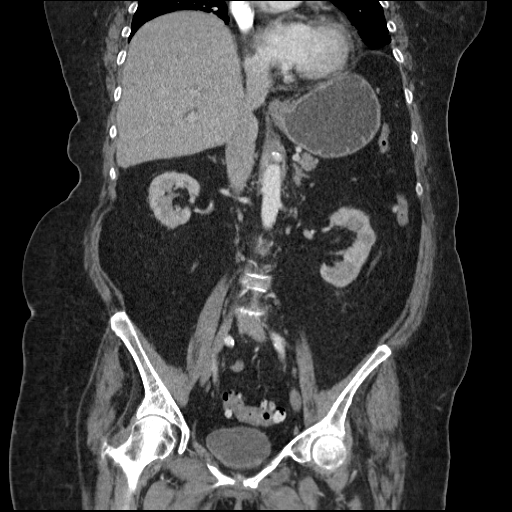

[16 of 46 positions shown; findings below may reference images not displayed]

FINDINGS: Gallbladder is full with mild infiltration of surrounding
fat planes.  No calcified gallstone common bile duct stone. Small
amount of perihepatic fluid.  This may represent ongoing
inflammation.  Leak not excluded.

Within the right lobe liver, 1.9 cm low density lesion is noted.
It is possible this was present previously although more
conspicuous and therefore abscess not excluded.  Within the left
lobe liver, 2 cm lesion and without significant change.  Etiology
indeterminate.  This can be assessed on elective MR of the liver
that the patient's acute episode has cleared.

No focal splenic, pancreatic, adrenal or renal mass identified.

Prominent diverticula most notable sigmoid colon without
surrounding inflammation.

Partially decompressed noncontrast filled views of the urinary
bladder unremarkable.

No free air.

Vascular calcifications without abdominal aortic aneurysm.

Degenerative changes lumbar spine without bony destructive lesion.

Basilar subsegmental atelectasis.
IMPRESSION: Fullness of the gallbladder and common bile duct with gallbladder
wall thickening and fluid in the perihepatic region may represent
ongoing inflammation or possibly small gallbladder leak in this
patient who has had a prior cholecystotomy.

Liver lesions are of indeterminate etiology.  Abscess within the
right lobe liver not excluded.

Vascular calcifications.

Degenerative changes lumbar spine.

Diverticulosis most notable sigmoid colon.

## 2012-05-02 ENCOUNTER — Ambulatory Visit (INDEPENDENT_AMBULATORY_CARE_PROVIDER_SITE_OTHER): Payer: Medicare Other | Admitting: Emergency Medicine

## 2012-05-02 ENCOUNTER — Encounter: Payer: Self-pay | Admitting: Emergency Medicine

## 2012-05-02 VITALS — BP 128/72 | HR 68 | Temp 97.6°F | Ht 62.5 in | Wt 214.4 lb

## 2012-05-02 DIAGNOSIS — I2789 Other specified pulmonary heart diseases: Secondary | ICD-10-CM

## 2012-05-02 NOTE — Progress Notes (Signed)
  Subjective:    Patient ID: [name], female    DOB: 1933/07/16, 79 y.o.   MRN: [id number] HPI 79 yo never smoker, hx of HTN, CAD s/p PTCI 6/12, cholecystitis s/p GB drain 9/12, (plan for sgy on 11/29). She has a TTE from 12/05/10 (during the hospitalization) that showed good LV fxn, slightly enlarged LA, estimated PASP of 42mmHg. She believes that her breathing is good, still building herself back up to usual activity level. She is scheduled for a sleep study 11/20.   ROV 03/19/11 -- f/u visit for [place] discovered on TTE 12/05/10. Hx of HTN, CAD. She underwent PSG in [place] since last time, not available to me right now. She had her chole as planned. She tells me that she feels much better since her surgery. No dyspnea, no CP,   ROV 11/04/11 -- PAH discovered on TTE 12/05/10 (~PASP 2mmHg). Hx of HTN, CAD. She underwent PSG in [place] > no OSA, questioned RLS. She has gained wt since last time. Exercise tolerance is limited, fatigue with walking through a store.  No CP.   ROV 05/02/12 -- PAH discovered on TTE 12/05/10 (~PASP 20mmHg). Hx of HTN, CAD. She underwent PSG in [place] > no OSA, questioned RLS. Repeat TTE done 12/02/11 >> normal RV size and fxn, no tr jet to estimate RVSP. She had to limit her physical therapy due to back pain.       Objective:   Physical Exam Filed Vitals:   05/02/12 1109  BP: 128/72  Pulse: 68  Temp: 97.6 F (36.4 C)   Gen: Pleasant, obese, in no distress,  normal affect  ENT: No lesions,  mouth clear,  oropharynx clear, no postnasal drip  Neck: No JVD, no TMG, no carotid bruits  Lungs: No use of accessory muscles, no dullness to percussion, clear without rales or rhonchi  Cardiovascular: RRR, heart sounds normal, no murmur or gallops, no peripheral edema  Musculoskeletal: No deformities, no cyanosis or clubbing  Neuro: alert, non focal  Skin: Warm, no lesions or rashes  TTE 12/02/11 >> normal RV fxn and RA. RVSP not estimated due to no TR  jet      Assessment & Plan:  Pulmonary Hypertension, secondary Feels well, breathing has benefitted from PT. Latest TTE was reassuring in 9/13.  - continue to follow w Dr Nadyne Coombes and follow TTE - Dr Nadyne Coombes can refer back to me if her estimated PAP's change.

## 2012-05-02 NOTE — Patient Instructions (Addendum)
Please continue your blood pressure medications as you have been taking them We do not need to start any kind of medicatoin for pulmonary hypertension Follow with Dr Nadyne Coombes. If your echocardiogram changes, then he will send you back to see Dr Lamonte Sakai. Or follow here if your breathing changes or worsens.

## 2012-05-02 NOTE — Assessment & Plan Note (Addendum)
Feels well, breathing has benefitted from PT. Latest TTE was reassuring in 9/13. Suspect this reflects months of better BP control.  - continue to follow w Dr Nadyne Coombes and follow TTE - Dr Nadyne Coombes can refer back to me if her estimated PAP's change.

## 2012-06-07 DIAGNOSIS — G894 Chronic pain syndrome: Secondary | ICD-10-CM | POA: Diagnosis not present

## 2012-06-07 DIAGNOSIS — M546 Pain in thoracic spine: Secondary | ICD-10-CM | POA: Diagnosis not present

## 2012-06-07 DIAGNOSIS — Z79899 Other long term (current) drug therapy: Secondary | ICD-10-CM | POA: Diagnosis not present

## 2012-06-07 DIAGNOSIS — M545 Low back pain: Secondary | ICD-10-CM | POA: Diagnosis not present

## 2012-07-03 ENCOUNTER — Other Ambulatory Visit: Payer: Medicare Other

## 2012-07-03 ENCOUNTER — Other Ambulatory Visit: Payer: Self-pay | Admitting: Pain Medicine

## 2012-07-03 DIAGNOSIS — M545 Low back pain: Secondary | ICD-10-CM

## 2012-07-11 DIAGNOSIS — M47817 Spondylosis without myelopathy or radiculopathy, lumbosacral region: Secondary | ICD-10-CM | POA: Diagnosis not present

## 2012-07-11 DIAGNOSIS — M25559 Pain in unspecified hip: Secondary | ICD-10-CM | POA: Diagnosis not present

## 2012-07-12 ENCOUNTER — Other Ambulatory Visit: Payer: Medicare Other

## 2012-07-17 DIAGNOSIS — M47817 Spondylosis without myelopathy or radiculopathy, lumbosacral region: Secondary | ICD-10-CM | POA: Diagnosis not present

## 2012-07-17 DIAGNOSIS — M5126 Other intervertebral disc displacement, lumbar region: Secondary | ICD-10-CM | POA: Diagnosis not present

## 2012-07-19 DIAGNOSIS — M5137 Other intervertebral disc degeneration, lumbosacral region: Secondary | ICD-10-CM | POA: Diagnosis not present

## 2012-07-19 DIAGNOSIS — M1990 Unspecified osteoarthritis, unspecified site: Secondary | ICD-10-CM | POA: Diagnosis not present

## 2012-07-19 DIAGNOSIS — G894 Chronic pain syndrome: Secondary | ICD-10-CM | POA: Diagnosis not present

## 2012-07-19 DIAGNOSIS — M461 Sacroiliitis, not elsewhere classified: Secondary | ICD-10-CM | POA: Diagnosis not present

## 2012-07-28 DIAGNOSIS — M79609 Pain in unspecified limb: Secondary | ICD-10-CM | POA: Diagnosis not present

## 2012-07-28 DIAGNOSIS — E119 Type 2 diabetes mellitus without complications: Secondary | ICD-10-CM | POA: Diagnosis not present

## 2012-07-28 DIAGNOSIS — E7800 Pure hypercholesterolemia, unspecified: Secondary | ICD-10-CM | POA: Diagnosis not present

## 2012-07-28 DIAGNOSIS — I10 Essential (primary) hypertension: Secondary | ICD-10-CM | POA: Diagnosis not present

## 2012-07-28 DIAGNOSIS — M7730 Calcaneal spur, unspecified foot: Secondary | ICD-10-CM | POA: Diagnosis not present

## 2012-08-01 DIAGNOSIS — L739 Follicular disorder, unspecified: Secondary | ICD-10-CM | POA: Diagnosis not present

## 2012-08-01 DIAGNOSIS — L821 Other seborrheic keratosis: Secondary | ICD-10-CM | POA: Diagnosis not present

## 2012-08-01 DIAGNOSIS — L570 Actinic keratosis: Secondary | ICD-10-CM | POA: Diagnosis not present

## 2012-08-01 DIAGNOSIS — L708 Other acne: Secondary | ICD-10-CM | POA: Diagnosis not present

## 2012-08-01 DIAGNOSIS — Z8049 Family history of malignant neoplasm of other genital organs: Secondary | ICD-10-CM | POA: Diagnosis not present

## 2012-08-30 DIAGNOSIS — E669 Obesity, unspecified: Secondary | ICD-10-CM | POA: Diagnosis not present

## 2012-08-30 DIAGNOSIS — I10 Essential (primary) hypertension: Secondary | ICD-10-CM | POA: Diagnosis not present

## 2012-08-30 DIAGNOSIS — E119 Type 2 diabetes mellitus without complications: Secondary | ICD-10-CM | POA: Diagnosis not present

## 2012-08-31 ENCOUNTER — Encounter: Payer: Self-pay | Admitting: Gastroenterology

## 2012-09-04 DIAGNOSIS — I10 Essential (primary) hypertension: Secondary | ICD-10-CM | POA: Diagnosis not present

## 2012-09-04 DIAGNOSIS — E119 Type 2 diabetes mellitus without complications: Secondary | ICD-10-CM | POA: Diagnosis not present

## 2012-09-04 DIAGNOSIS — I2510 Atherosclerotic heart disease of native coronary artery without angina pectoris: Secondary | ICD-10-CM | POA: Diagnosis not present

## 2012-09-04 DIAGNOSIS — E7800 Pure hypercholesterolemia, unspecified: Secondary | ICD-10-CM | POA: Diagnosis not present

## 2012-09-18 DIAGNOSIS — H4011X0 Primary open-angle glaucoma, stage unspecified: Secondary | ICD-10-CM | POA: Diagnosis not present

## 2012-09-18 DIAGNOSIS — H409 Unspecified glaucoma: Secondary | ICD-10-CM | POA: Diagnosis not present

## 2012-11-16 DIAGNOSIS — E119 Type 2 diabetes mellitus without complications: Secondary | ICD-10-CM | POA: Diagnosis not present

## 2012-11-16 DIAGNOSIS — E7800 Pure hypercholesterolemia, unspecified: Secondary | ICD-10-CM | POA: Diagnosis not present

## 2012-11-16 DIAGNOSIS — I10 Essential (primary) hypertension: Secondary | ICD-10-CM | POA: Diagnosis not present

## 2012-11-16 DIAGNOSIS — I2510 Atherosclerotic heart disease of native coronary artery without angina pectoris: Secondary | ICD-10-CM | POA: Diagnosis not present

## 2013-01-23 DIAGNOSIS — Z23 Encounter for immunization: Secondary | ICD-10-CM | POA: Diagnosis not present

## 2013-04-04 DIAGNOSIS — E119 Type 2 diabetes mellitus without complications: Secondary | ICD-10-CM | POA: Diagnosis not present

## 2013-04-04 DIAGNOSIS — I10 Essential (primary) hypertension: Secondary | ICD-10-CM | POA: Diagnosis not present

## 2013-04-11 DIAGNOSIS — E7800 Pure hypercholesterolemia, unspecified: Secondary | ICD-10-CM | POA: Diagnosis not present

## 2013-04-11 DIAGNOSIS — E119 Type 2 diabetes mellitus without complications: Secondary | ICD-10-CM | POA: Diagnosis not present

## 2013-04-11 DIAGNOSIS — I2510 Atherosclerotic heart disease of native coronary artery without angina pectoris: Secondary | ICD-10-CM | POA: Diagnosis not present

## 2013-04-11 DIAGNOSIS — I10 Essential (primary) hypertension: Secondary | ICD-10-CM | POA: Diagnosis not present

## 2013-04-18 DIAGNOSIS — R040 Epistaxis: Secondary | ICD-10-CM | POA: Diagnosis not present

## 2013-04-18 DIAGNOSIS — H6120 Impacted cerumen, unspecified ear: Secondary | ICD-10-CM | POA: Diagnosis not present

## 2013-06-11 DIAGNOSIS — R040 Epistaxis: Secondary | ICD-10-CM | POA: Diagnosis not present

## 2013-06-13 DIAGNOSIS — H26499 Other secondary cataract, unspecified eye: Secondary | ICD-10-CM | POA: Diagnosis not present

## 2013-06-13 DIAGNOSIS — H4011X0 Primary open-angle glaucoma, stage unspecified: Secondary | ICD-10-CM | POA: Diagnosis not present

## 2013-06-13 DIAGNOSIS — H35369 Drusen (degenerative) of macula, unspecified eye: Secondary | ICD-10-CM | POA: Diagnosis not present

## 2013-06-13 DIAGNOSIS — H43399 Other vitreous opacities, unspecified eye: Secondary | ICD-10-CM | POA: Diagnosis not present

## 2013-10-04 DIAGNOSIS — I10 Essential (primary) hypertension: Secondary | ICD-10-CM | POA: Diagnosis not present

## 2013-10-04 DIAGNOSIS — E119 Type 2 diabetes mellitus without complications: Secondary | ICD-10-CM | POA: Diagnosis not present

## 2013-10-11 DIAGNOSIS — I10 Essential (primary) hypertension: Secondary | ICD-10-CM | POA: Diagnosis not present

## 2013-10-11 DIAGNOSIS — E119 Type 2 diabetes mellitus without complications: Secondary | ICD-10-CM | POA: Diagnosis not present

## 2013-10-11 DIAGNOSIS — E7800 Pure hypercholesterolemia, unspecified: Secondary | ICD-10-CM | POA: Diagnosis not present

## 2013-10-11 DIAGNOSIS — I2510 Atherosclerotic heart disease of native coronary artery without angina pectoris: Secondary | ICD-10-CM | POA: Diagnosis not present

## 2013-11-19 DIAGNOSIS — Z8739 Personal history of other diseases of the musculoskeletal system and connective tissue: Secondary | ICD-10-CM | POA: Diagnosis not present

## 2013-11-19 DIAGNOSIS — I2510 Atherosclerotic heart disease of native coronary artery without angina pectoris: Secondary | ICD-10-CM | POA: Diagnosis not present

## 2013-11-19 DIAGNOSIS — I10 Essential (primary) hypertension: Secondary | ICD-10-CM | POA: Diagnosis not present

## 2013-11-19 DIAGNOSIS — E785 Hyperlipidemia, unspecified: Secondary | ICD-10-CM | POA: Diagnosis not present

## 2013-11-26 ENCOUNTER — Other Ambulatory Visit: Payer: Self-pay | Admitting: Dermatology

## 2013-11-26 DIAGNOSIS — C44319 Basal cell carcinoma of skin of other parts of face: Secondary | ICD-10-CM | POA: Diagnosis not present

## 2013-11-26 DIAGNOSIS — L723 Sebaceous cyst: Secondary | ICD-10-CM | POA: Diagnosis not present

## 2013-11-26 DIAGNOSIS — D485 Neoplasm of uncertain behavior of skin: Secondary | ICD-10-CM | POA: Diagnosis not present

## 2013-12-17 DIAGNOSIS — C44319 Basal cell carcinoma of skin of other parts of face: Secondary | ICD-10-CM | POA: Diagnosis not present

## 2014-03-12 DIAGNOSIS — Z23 Encounter for immunization: Secondary | ICD-10-CM | POA: Diagnosis not present

## 2014-04-10 DIAGNOSIS — R635 Abnormal weight gain: Secondary | ICD-10-CM | POA: Diagnosis not present

## 2014-04-10 DIAGNOSIS — I10 Essential (primary) hypertension: Secondary | ICD-10-CM | POA: Diagnosis not present

## 2014-04-10 DIAGNOSIS — E118 Type 2 diabetes mellitus with unspecified complications: Secondary | ICD-10-CM | POA: Diagnosis not present

## 2014-04-15 DIAGNOSIS — I10 Essential (primary) hypertension: Secondary | ICD-10-CM | POA: Diagnosis not present

## 2014-04-15 DIAGNOSIS — E118 Type 2 diabetes mellitus with unspecified complications: Secondary | ICD-10-CM | POA: Diagnosis not present

## 2014-04-15 DIAGNOSIS — E780 Pure hypercholesterolemia: Secondary | ICD-10-CM | POA: Diagnosis not present

## 2014-04-15 DIAGNOSIS — I2510 Atherosclerotic heart disease of native coronary artery without angina pectoris: Secondary | ICD-10-CM | POA: Diagnosis not present

## 2014-04-18 ENCOUNTER — Ambulatory Visit: Payer: Medicare Other | Admitting: Nurse Practitioner

## 2014-04-23 ENCOUNTER — Ambulatory Visit: Payer: Medicare Other | Admitting: Physician Assistant

## 2014-04-24 ENCOUNTER — Other Ambulatory Visit (INDEPENDENT_AMBULATORY_CARE_PROVIDER_SITE_OTHER): Payer: Medicare Other

## 2014-04-24 ENCOUNTER — Ambulatory Visit (INDEPENDENT_AMBULATORY_CARE_PROVIDER_SITE_OTHER): Payer: Medicare Other | Admitting: Gastroenterology

## 2014-04-24 ENCOUNTER — Encounter: Payer: Self-pay | Admitting: Gastroenterology

## 2014-04-24 VITALS — BP 140/90 | HR 76 | Ht 62.0 in | Wt 214.6 lb

## 2014-04-24 DIAGNOSIS — R197 Diarrhea, unspecified: Secondary | ICD-10-CM | POA: Insufficient documentation

## 2014-04-24 LAB — IGA: IGA: 234 mg/dL (ref 68–378)

## 2014-04-24 MED ORDER — MOVIPREP 100 G PO SOLR: 1.0000 | Freq: Once | ORAL | Status: DC

## 2014-04-24 NOTE — Patient Instructions (Signed)
You have been scheduled for a colonoscopy. Please follow written instructions given to you at your visit today.  Please pick up your prep kit at the pharmacy within the next 1-3 days. If you use inhalers (even only as needed), please bring them with you on the day of your procedure. Your physician has requested that you go to www.startemmi.com and enter the access code given to you at your visit today. This web site gives a general overview about your procedure. However, you should still follow specific instructions given to you by our office regarding your preparation for the procedure. Your physician has requested that you go to the basement for  lab work before leaving today. Use Imodium as needed. CC:  Jani Gravel MD

## 2014-04-24 NOTE — Progress Notes (Addendum)
04/24/2014 [name] [id number] 1934-03-15   HISTORY OF PRESENT ILLNESS:  This is a pleasant 81 year old female who is new to our practice, referred by PCP, Dr. Maudie Mercury, to discuss her issues with diarrhea.  She says that she moved here from [place] 8 years ago and ever since then she's had some issues with intermittent diarrhea; never had bowel issues when she lived in [place].  At first the diarrhea was very sporadic, but has been worsening especially over the past several months.  Has urgency and no warning at times that has caused her to have accidents/incontinence.  Is very distressing and she says that she is becoming depressed because it is controlling her life and preventing her from going and doing things.  Symptoms are definitely worse in the morning.  Denies any abdominal pain or rectal bleeding.  She has never undergone colonoscopy in the past.  Recent CBC, CMP, and TSH were WNL's.  She is s/p cholecystectomy in 2012.   Past Medical History  Diagnosis Date  . Hypertension   . GERD (gastroesophageal reflux disease)   . Dyslipidemia   . Hypokalemia   . Acalculous cholecystitis   . Dizzy   . Fatigue   . PONV (postoperative nausea and vomiting)     yrs ago  . Coronary artery disease     mi  7/12,  stress test 12/21/2011=>negative for ischemia [name])  . Myocardial infarction     7/12  . Shortness of breath   . Blood transfusion   . Arthritis   . Diabetes mellitus     metformin taken off  . Mitral regurgitation     mild to moderate, cardiac echo 12/02/2011, EF 51% [name])   Past Surgical History  Procedure Laterality Date  . Parathyroidectomy      tumor excision  . Abdominal hysterectomy    . Cataract extraction      2  . Coronary angioplasty with stent placement  09/24/10    drug eluting stents  . Shoulder surgery      right  . Biliary drainage  9/12  . Cholecystectomy  02/25/2011    Procedure: LAPAROSCOPIC CHOLECYSTECTOMY WITH INTRAOPERATIVE  CHOLANGIOGRAM;  Surgeon: Gayland Curry, MD;  Location: [facility];  Service: General;  Laterality: N/A;    reports that she has never smoked. She has never used smokeless tobacco. She reports that she drinks alcohol. She reports that she does not use illicit drugs. family history includes Cancer in her maternal grandfather; Heart disease in her brother and father; Melanoma in her son. Allergies  Allergen Reactions  . Dilaudid [Hydromorphone Hcl] Other (See Comments)    Caused patient psychological disturbances   . Sulfur Rash      Outpatient Encounter Prescriptions as of 04/24/2014  Medication Sig  . aspirin 81 MG tablet Take 81 mg by mouth daily.    . brimonidine-timolol (COMBIGAN) 0.2-0.5 % ophthalmic solution Place 1 drop into the left eye every 12 (twelve) hours.    . diclofenac (FLECTOR) 1.3 % PTCH Place 1 patch onto the skin 2 (two) times daily.  Marland Kitchen esomeprazole (NEXIUM) 40 MG capsule Take 40 mg by mouth daily before breakfast.    . methocarbamol (ROBAXIN) 500 MG tablet Take 500-1,000 mg by mouth every 6 (six) hours as needed. For spasm.  . nitroGLYCERIN (NITROSTAT) 0.4 MG SL tablet Place 0.4 mg under the tongue every 5 (five) minutes as needed for chest pain.  . rosuvastatin (CRESTOR) 5 MG  tablet Take 5 mg by mouth daily.    . valsartan (DIOVAN) 320 MG tablet Take 160 mg by mouth daily.   Marland Kitchen MOVIPREP 100 G SOLR Take 1 kit (200 g total) by mouth once.  . [DISCONTINUED] chlorthalidone (HYGROTON) 25 MG tablet Take 12.5 mg by mouth as needed.   . [DISCONTINUED] clopidogrel (PLAVIX) 75 MG tablet Take 75 mg by mouth daily.  . [DISCONTINUED] metoprolol tartrate (LOPRESSOR) 25 MG tablet Take 25 mg by mouth 2 (two) times daily.   . [DISCONTINUED] Polyethyl Glycol-Propyl Glycol (SYSTANE ULTRA OP) Apply 2-3 drops to eye daily as needed. For dry eyes.     REVIEW OF SYSTEMS  : All other systems reviewed and negative except where noted in the History of Present Illness.   PHYSICAL EXAM: BP 140/90  mmHg  Pulse 76  Ht '5\' 2"'  (1.575 m)  Wt 214 lb 9.6 oz (97.342 kg)  BMI 39.24 kg/m2  SpO2 98% General: Well developed white female in no acute distress Head: Normocephalic and atraumatic Eyes:  Sclerae anicteric, conjunctiva pink. Ears: Normal auditory acuity Lungs: Clear throughout to auscultation Heart: Regular rate and rhythm Abdomen: Soft, non-distended.  Normal bowel sounds.  Non-tender. Rectal:  Will be done at the time of colonoscopy. Musculoskeletal: Symmetrical with no gross deformities  Skin: No lesions on visible extremities Extremities: No edema  Neurological: Alert oriented x 4, grossly non-focal Psychological:  Alert and cooperative. Normal mood and affect  ASSESSMENT AND PLAN: -Diarrhea:  Chronic at this point, but seems to be worsening.  Could be IBS and/or bile-salt related diarrhea, but need to rule out other issues as well.  Will check stool studies, however, it is unlikely at this point that this is infectious; will check pancreatic elastase as well.  Check celiac labs.  She has never undergone colonoscopy so we will schedule for that as well; consider random biopsies to rule out microscopic colitis.  In the interim she can use Imodium prn as she desires.  The risks, benefits, and alternatives were discussed with the patient and she consents to proceed.   Addendum: Reviewed and agree with initial management. Jerene Bears, MD

## 2014-04-25 LAB — TISSUE TRANSGLUTAMINASE, IGA: Tissue Transglutaminase Ab, IgA: 1 U/mL (ref <4)

## 2014-04-30 ENCOUNTER — Other Ambulatory Visit: Payer: Medicare Other

## 2014-04-30 DIAGNOSIS — R197 Diarrhea, unspecified: Secondary | ICD-10-CM

## 2014-05-01 DIAGNOSIS — H40052 Ocular hypertension, left eye: Secondary | ICD-10-CM | POA: Diagnosis not present

## 2014-05-01 LAB — OVA AND PARASITE EXAMINATION: OP: NONE SEEN

## 2014-05-03 LAB — GASTROINTESTINAL PATHOGEN PANEL PCR

## 2014-05-07 ENCOUNTER — Encounter: Payer: Medicare Other | Admitting: Internal Medicine

## 2014-05-10 LAB — PANCREATIC ELASTASE, FECAL: Pancreatic Elastase-1, Stool: >500 mcg/g

## 2014-06-11 ENCOUNTER — Encounter: Payer: Self-pay | Admitting: Internal Medicine

## 2014-06-11 ENCOUNTER — Ambulatory Visit (AMBULATORY_SURGERY_CENTER): Payer: Medicare Other | Admitting: Internal Medicine

## 2014-06-11 VITALS — BP 154/82 | HR 74 | Temp 97.7°F | Resp 16 | Ht 62.0 in | Wt 214.0 lb

## 2014-06-11 DIAGNOSIS — D122 Benign neoplasm of ascending colon: Secondary | ICD-10-CM | POA: Diagnosis not present

## 2014-06-11 DIAGNOSIS — I2510 Atherosclerotic heart disease of native coronary artery without angina pectoris: Secondary | ICD-10-CM | POA: Diagnosis not present

## 2014-06-11 DIAGNOSIS — I341 Nonrheumatic mitral (valve) prolapse: Secondary | ICD-10-CM | POA: Diagnosis not present

## 2014-06-11 DIAGNOSIS — I252 Old myocardial infarction: Secondary | ICD-10-CM | POA: Diagnosis not present

## 2014-06-11 DIAGNOSIS — R197 Diarrhea, unspecified: Secondary | ICD-10-CM | POA: Diagnosis not present

## 2014-06-11 DIAGNOSIS — I272 Other secondary pulmonary hypertension: Secondary | ICD-10-CM | POA: Diagnosis not present

## 2014-06-11 MED ORDER — COLESTIPOL HCL 5 G PO GRAN: 3.0000 g | GRANULES | Freq: Every morning | ORAL | Status: DC

## 2014-06-11 MED ORDER — SODIUM CHLORIDE 0.9 % IV SOLN: 500.0000 mL | INTRAVENOUS | Status: DC

## 2014-06-11 NOTE — Op Note (Signed)
[facility]  [address] [place], [address]   COLONOSCOPY PROCEDURE REPORT  [name], [name]  MR#: [id number] BIRTHDATE: 1934-03-14 , 30  yrs. old GENDER: female ENDOSCOPIST: Lafayette Dragon, MD REFERRED [id number] Kim, M.D. PROCEDURE DATE:  06/11/2014 PROCEDURE:   Colonoscopy with snare polypectomy, Colonoscopy with cold biopsy polypectomy, and Colonoscopy with biopsy First Screening Colonoscopy - Avg.  risk and is 50 yrs.  old or older Yes.  Prior Negative Screening - Now for repeat screening. N/A  History of Adenoma - Now for follow-up colonoscopy & has been > or = to 3 yrs.  N/A ASA CLASS:   Class III INDICATIONS:Screening for colonic neoplasia, Colorectal Neoplasm Risk Assessment for this procedure is average risk, and history of chronic diarrhea and urgency with occasional incontinence. MEDICATIONS: Monitored anesthesia care and Propofol 225 mg IV  DESCRIPTION OF PROCEDURE:   After the risks benefits and alternatives of the procedure were thoroughly explained, informed consent was obtained.  The digital rectal exam revealed decreased sphincter tone.   The LB [id number]  endoscope was introduced through the anus and advanced to the cecum, which was identified by both the appendix and ileocecal valve. No adverse events experienced.   The quality of the prep was good.  (MoviPrep was used)  The instrument was then slowly withdrawn as the colon was fully examined.      COLON FINDINGS: Two polypoid shaped sessile polyps measuring 8 mm in size were found in the ascending colon.  A polypectomy was performed with a cold snare.  The resection was complete, the polyp tissue was completely retrieved and sent to histology.  A polypectomy was performed with cold forceps.  The resection was complete, the polyp tissue was completely retrieved and sent to histology.  Multiple biopsies were performed using cold forceps. Sample was obtained and sent to  histology.  Retroflexed views revealed no abnormalities. there was moderately severe diverticulosis of the sigmoid and descending colon. There was tortuosity, hypertrophy thickened foldsThe time to cecum = 7.36 Withdrawal time = 8.27   The scope was withdrawn and the procedure completed. COMPLICATIONS: There were no immediate complications.  ENDOSCOPIC IMPRESSION: Two sessile polyps were found in the ascending colon; polypectomy was performed with a cold snare; polypectomy was performed with cold forceps Random  biopsies were performed using cold forceps to r/o microscopic col;itis decreased rectal sphincter tone Moderately severe diverticulosis of the sigmoid and descending colon  RECOMMENDATIONS: 1.  Await pathology results 2.  Colestid 1gm, 2 po qd Recall colonoscopy pending path report  eSigned:  Lafayette Dragon, MD 06/11/2014 12:26 PM   cc:   PATIENT NAME:  [name], [name] MR#: [id number]

## 2014-06-11 NOTE — Progress Notes (Signed)
Pt sedated again for biopsy

## 2014-06-11 NOTE — Progress Notes (Signed)
No egg or soy allergy N/V post op in the past but no other issues with past sedation

## 2014-06-11 NOTE — Progress Notes (Signed)
Called to room to assist during endoscopic procedure.  Patient ID and intended procedure confirmed with present staff. Received instructions for my participation in the procedure from the performing physician.  

## 2014-06-11 NOTE — Patient Instructions (Signed)
Findings:  Polyps, diverticulosis Recommendations:  Wait for pathology results, Recall colonoscopy depends on pathology report, Colestid 1 gm, 2 po gd  YOU HAD AN ENDOSCOPIC PROCEDURE TODAY AT [facility]:   Refer to the procedure report that was given to you for any specific questions about what was found during the examination.  If the procedure report does not answer your questions, please call your gastroenterologist to clarify.  If you requested that your care partner not be given the details of your procedure findings, then the procedure report has been included in a sealed envelope for you to review at your convenience later.  YOU SHOULD EXPECT: Some feelings of bloating in the abdomen. Passage of more gas than usual.  Walking can help get rid of the air that was put into your GI tract during the procedure and reduce the bloating. If you had a lower endoscopy (such as a colonoscopy or flexible sigmoidoscopy) you may notice spotting of blood in your stool or on the toilet paper. If you underwent a bowel prep for your procedure, you may not have a normal bowel movement for a few days.  Please Note:  You might notice some irritation and congestion in your nose or some drainage.  This is from the oxygen used during your procedure.  There is no need for concern and it should clear up in a day or so.  SYMPTOMS TO REPORT IMMEDIATELY:   Following lower endoscopy (colonoscopy or flexible sigmoidoscopy):  Excessive amounts of blood in the stool  Significant tenderness or worsening of abdominal pains  Swelling of the abdomen that is new, acute  Fever of 100F or higher   Following upper endoscopy (EGD)  Vomiting of blood or coffee ground material  New chest pain or pain under the shoulder blades  Painful or persistently difficult swallowing  New shortness of breath  Fever of 100F or higher  Black, tarry-looking stools  For urgent or emergent issues, a gastroenterologist can  be reached at any hour by calling [phone].   DIET: Your first meal following the procedure should be a small meal and then it is ok to progress to your normal diet. Heavy or fried foods are harder to digest and may make you feel nauseous or bloated.  Likewise, meals heavy in dairy and vegetables can increase bloating.  Drink plenty of fluids but you should avoid alcoholic beverages for 24 hours.  ACTIVITY:  You should plan to take it easy for the rest of today and you should NOT DRIVE or use heavy machinery until tomorrow (because of the sedation medicines used during the test).    FOLLOW UP: Our staff will call the number listed on your records the next business day following your procedure to check on you and address any questions or concerns that you may have regarding the information given to you following your procedure. If we do not reach you, we will leave a message.  However, if you are feeling well and you are not experiencing any problems, there is no need to return our call.  We will assume that you have returned to your regular daily activities without incident.  If any biopsies were taken you will be contacted by phone or by letter within the next 1-3 weeks.  Please call us at [phone] if you have not heard about the biopsies in 3 weeks.    SIGNATURES/CONFIDENTIALITY: You and/or your care partner have signed paperwork which will be entered into your  electronic medical record.  These signatures attest to the fact that that the information above on your After Visit Summary has been reviewed and is understood.  Full responsibility of the confidentiality of this discharge information lies with you and/or your care-partner.   Please follow all discharge instructions given to you by the recovery room nurse. If you have any questions or problems after discharge please call one of the numbers listed above. You will receive a phone call in the am to see how you are doing and answer  any questions you may have. Thank you for choosing [facility] for your health care needs.

## 2014-06-11 NOTE — Progress Notes (Signed)
Pt awake and alert, report to RN. Pt pleased with MAC

## 2014-06-12 ENCOUNTER — Telehealth: Payer: Self-pay | Admitting: *Deleted

## 2014-06-12 NOTE — Telephone Encounter (Signed)
  Follow up Call-  Call back number 06/11/2014  Post procedure Call Back phone  # [phone]  Permission to leave phone message Yes     Patient questions:  Do you have a fever, pain , or abdominal swelling? No. Pain Score  0 *  Have you tolerated food without any problems? Yes.    Have you been able to return to your normal activities? Yes.    Do you have any questions about your discharge instructions: Diet   No. Medications  No. Follow up visit  No.  Do you have questions or concerns about your Care? No.  Actions: * If pain score is 4 or above: No action needed, pain <4.

## 2014-06-17 ENCOUNTER — Encounter: Payer: Self-pay | Admitting: Internal Medicine

## 2014-06-19 ENCOUNTER — Other Ambulatory Visit: Payer: Self-pay | Admitting: Dermatology

## 2014-06-19 DIAGNOSIS — Z411 Encounter for cosmetic surgery: Secondary | ICD-10-CM | POA: Diagnosis not present

## 2014-06-19 DIAGNOSIS — D485 Neoplasm of uncertain behavior of skin: Secondary | ICD-10-CM | POA: Diagnosis not present

## 2014-06-19 DIAGNOSIS — D225 Melanocytic nevi of trunk: Secondary | ICD-10-CM | POA: Diagnosis not present

## 2014-06-19 DIAGNOSIS — L719 Rosacea, unspecified: Secondary | ICD-10-CM | POA: Diagnosis not present

## 2014-06-19 DIAGNOSIS — L820 Inflamed seborrheic keratosis: Secondary | ICD-10-CM | POA: Diagnosis not present

## 2014-06-19 DIAGNOSIS — Z85828 Personal history of other malignant neoplasm of skin: Secondary | ICD-10-CM | POA: Diagnosis not present

## 2014-06-19 DIAGNOSIS — L821 Other seborrheic keratosis: Secondary | ICD-10-CM | POA: Diagnosis not present

## 2014-06-19 DIAGNOSIS — Z808 Family history of malignant neoplasm of other organs or systems: Secondary | ICD-10-CM | POA: Diagnosis not present

## 2014-06-19 DIAGNOSIS — C44311 Basal cell carcinoma of skin of nose: Secondary | ICD-10-CM | POA: Diagnosis not present

## 2014-06-20 DIAGNOSIS — D225 Melanocytic nevi of trunk: Secondary | ICD-10-CM | POA: Diagnosis not present

## 2014-10-03 DIAGNOSIS — I10 Essential (primary) hypertension: Secondary | ICD-10-CM | POA: Diagnosis not present

## 2014-10-03 DIAGNOSIS — E118 Type 2 diabetes mellitus with unspecified complications: Secondary | ICD-10-CM | POA: Diagnosis not present

## 2014-10-22 DIAGNOSIS — E780 Pure hypercholesterolemia: Secondary | ICD-10-CM | POA: Diagnosis not present

## 2014-10-22 DIAGNOSIS — I10 Essential (primary) hypertension: Secondary | ICD-10-CM | POA: Diagnosis not present

## 2014-10-22 DIAGNOSIS — E119 Type 2 diabetes mellitus without complications: Secondary | ICD-10-CM | POA: Diagnosis not present

## 2014-10-22 DIAGNOSIS — I2510 Atherosclerotic heart disease of native coronary artery without angina pectoris: Secondary | ICD-10-CM | POA: Diagnosis not present

## 2014-10-30 DIAGNOSIS — H40052 Ocular hypertension, left eye: Secondary | ICD-10-CM | POA: Diagnosis not present

## 2014-11-21 DIAGNOSIS — I2510 Atherosclerotic heart disease of native coronary artery without angina pectoris: Secondary | ICD-10-CM | POA: Diagnosis not present

## 2014-11-21 DIAGNOSIS — E780 Pure hypercholesterolemia: Secondary | ICD-10-CM | POA: Diagnosis not present

## 2014-11-21 DIAGNOSIS — Z9861 Coronary angioplasty status: Secondary | ICD-10-CM | POA: Diagnosis not present

## 2014-11-21 DIAGNOSIS — I10 Essential (primary) hypertension: Secondary | ICD-10-CM | POA: Diagnosis not present

## 2015-01-01 DIAGNOSIS — H40052 Ocular hypertension, left eye: Secondary | ICD-10-CM | POA: Diagnosis not present

## 2015-01-21 ENCOUNTER — Emergency Department (HOSPITAL_COMMUNITY): Payer: Medicare Other

## 2015-01-21 ENCOUNTER — Inpatient Hospital Stay (HOSPITAL_COMMUNITY)
Admission: EM | Admit: 2015-01-21 | Discharge: 2015-01-26 | DRG: 308 | Disposition: A | Discharge status: Home / Self Care | Payer: Medicare Other | Attending: Cardiology | Admitting: Cardiology

## 2015-01-21 ENCOUNTER — Encounter (HOSPITAL_COMMUNITY): Payer: Self-pay

## 2015-01-21 DIAGNOSIS — E785 Hyperlipidemia, unspecified: Secondary | ICD-10-CM | POA: Diagnosis present

## 2015-01-21 DIAGNOSIS — K219 Gastro-esophageal reflux disease without esophagitis: Secondary | ICD-10-CM | POA: Diagnosis present

## 2015-01-21 DIAGNOSIS — Z955 Presence of coronary angioplasty implant and graft: Secondary | ICD-10-CM | POA: Diagnosis not present

## 2015-01-21 DIAGNOSIS — E1122 Type 2 diabetes mellitus with diabetic chronic kidney disease: Secondary | ICD-10-CM | POA: Diagnosis not present

## 2015-01-21 DIAGNOSIS — I4891 Unspecified atrial fibrillation: Principal | ICD-10-CM | POA: Diagnosis present

## 2015-01-21 DIAGNOSIS — I2510 Atherosclerotic heart disease of native coronary artery without angina pectoris: Secondary | ICD-10-CM | POA: Diagnosis present

## 2015-01-21 DIAGNOSIS — N183 Chronic kidney disease, stage 3 (moderate): Secondary | ICD-10-CM | POA: Diagnosis present

## 2015-01-21 DIAGNOSIS — I130 Hypertensive heart and chronic kidney disease with heart failure and stage 1 through stage 4 chronic kidney disease, or unspecified chronic kidney disease: Secondary | ICD-10-CM | POA: Diagnosis present

## 2015-01-21 DIAGNOSIS — E119 Type 2 diabetes mellitus without complications: Secondary | ICD-10-CM | POA: Diagnosis not present

## 2015-01-21 DIAGNOSIS — I5033 Acute on chronic diastolic (congestive) heart failure: Secondary | ICD-10-CM | POA: Diagnosis not present

## 2015-01-21 DIAGNOSIS — Z9842 Cataract extraction status, left eye: Secondary | ICD-10-CM

## 2015-01-21 DIAGNOSIS — I252 Old myocardial infarction: Secondary | ICD-10-CM | POA: Diagnosis not present

## 2015-01-21 DIAGNOSIS — Z7982 Long term (current) use of aspirin: Secondary | ICD-10-CM | POA: Diagnosis not present

## 2015-01-21 DIAGNOSIS — Z6839 Body mass index (BMI) 39.0-39.9, adult: Secondary | ICD-10-CM

## 2015-01-21 DIAGNOSIS — I340 Nonrheumatic mitral (valve) insufficiency: Secondary | ICD-10-CM | POA: Diagnosis present

## 2015-01-21 DIAGNOSIS — E871 Hypo-osmolality and hyponatremia: Secondary | ICD-10-CM | POA: Diagnosis present

## 2015-01-21 DIAGNOSIS — Z9841 Cataract extraction status, right eye: Secondary | ICD-10-CM | POA: Diagnosis not present

## 2015-01-21 DIAGNOSIS — I499 Cardiac arrhythmia, unspecified: Secondary | ICD-10-CM | POA: Diagnosis not present

## 2015-01-21 DIAGNOSIS — Z79899 Other long term (current) drug therapy: Secondary | ICD-10-CM

## 2015-01-21 DIAGNOSIS — R0602 Shortness of breath: Secondary | ICD-10-CM | POA: Diagnosis not present

## 2015-01-21 DIAGNOSIS — I10 Essential (primary) hypertension: Secondary | ICD-10-CM | POA: Diagnosis not present

## 2015-01-21 DIAGNOSIS — H409 Unspecified glaucoma: Secondary | ICD-10-CM | POA: Diagnosis present

## 2015-01-21 DIAGNOSIS — E7800 Pure hypercholesterolemia, unspecified: Secondary | ICD-10-CM | POA: Diagnosis not present

## 2015-01-21 LAB — COMPREHENSIVE METABOLIC PANEL
ALT: 22 U/L (ref 14–54)
AST: 18 U/L (ref 15–41)
Albumin: 2.9 g/dL — ABNORMAL LOW (ref 3.5–5.0)
Alkaline Phosphatase: 74 U/L (ref 38–126)
Anion gap: 12 (ref 5–15)
BUN: 12 mg/dL (ref 6–20)
CHLORIDE: 84 mmol/L — AB (ref 101–111)
CO2: 30 mmol/L (ref 22–32)
CREATININE: 0.93 mg/dL (ref 0.44–1.00)
Calcium: 8.5 mg/dL — ABNORMAL LOW (ref 8.9–10.3)
GFR calc Af Amer: >60 mL/min (ref >60)
GFR calc non Af Amer: 57 mL/min — ABNORMAL LOW (ref >60)
GLUCOSE: 104 mg/dL — AB (ref 65–99)
Potassium: 3.5 mmol/L (ref 3.5–5.1)
SODIUM: 126 mmol/L — AB (ref 135–145)
Total Bilirubin: 1.1 mg/dL (ref 0.3–1.2)
Total Protein: 5.7 g/dL — ABNORMAL LOW (ref 6.5–8.1)

## 2015-01-21 LAB — URINALYSIS, ROUTINE W REFLEX MICROSCOPIC
BILIRUBIN URINE: NEGATIVE
GLUCOSE, UA: NEGATIVE mg/dL
Hgb urine dipstick: NEGATIVE
Ketones, ur: NEGATIVE mg/dL
LEUKOCYTES UA: NEGATIVE
NITRITE: NEGATIVE
PH: 7 (ref 5.0–8.0)
Protein, ur: NEGATIVE mg/dL
Specific Gravity, Urine: 1.008 (ref 1.005–1.030)
Urobilinogen, UA: 0.2 mg/dL (ref 0.0–1.0)

## 2015-01-21 LAB — CBC WITH DIFFERENTIAL/PLATELET
Basophils Absolute: 0 10*3/uL (ref 0.0–0.1)
Basophils Relative: 0 %
EOS ABS: 0.1 10*3/uL (ref 0.0–0.7)
EOS PCT: 1 %
HCT: 37.4 % (ref 36.0–46.0)
Hemoglobin: 12.8 g/dL (ref 12.0–15.0)
LYMPHS ABS: 2.1 10*3/uL (ref 0.7–4.0)
LYMPHS PCT: 32 %
MCH: 32.2 pg (ref 26.0–34.0)
MCHC: 34.2 g/dL (ref 30.0–36.0)
MCV: 94 fL (ref 78.0–100.0)
MONO ABS: 0.7 10*3/uL (ref 0.1–1.0)
MONOS PCT: 11 %
Neutro Abs: 3.6 10*3/uL (ref 1.7–7.7)
Neutrophils Relative %: 56 %
PLATELETS: 209 10*3/uL (ref 150–400)
RBC: 3.98 MIL/uL (ref 3.87–5.11)
RDW: 13.4 % (ref 11.5–15.5)
WBC: 6.4 10*3/uL (ref 4.0–10.5)

## 2015-01-21 LAB — I-STAT CG4 LACTIC ACID, ED: LACTIC ACID, VENOUS: 1.16 mmol/L (ref 0.5–2.0)

## 2015-01-21 LAB — TROPONIN I: TROPONIN I: 0.03 ng/mL (ref <0.031)

## 2015-01-21 LAB — BRAIN NATRIURETIC PEPTIDE: B Natriuretic Peptide: 373.9 pg/mL — ABNORMAL HIGH (ref 0.0–100.0)

## 2015-01-21 MED ORDER — LOPERAMIDE HCL 2 MG PO CAPS
2.0000 mg | ORAL_CAPSULE | ORAL | Status: DC | PRN
  Filled 2015-01-21: qty 1

## 2015-01-21 MED ORDER — IRBESARTAN 75 MG PO TABS
75.0000 mg | ORAL_TABLET | Freq: Every day | ORAL | Status: DC
  Administered 2015-01-22 – 2015-01-24 (×3): 75 mg via ORAL
  Filled 2015-01-21 (×4): qty 1

## 2015-01-21 MED ORDER — ASPIRIN 81 MG PO TABS: 81.0000 mg | ORAL_TABLET | Freq: Every day | ORAL | Status: DC

## 2015-01-21 MED ORDER — RIVAROXABAN 20 MG PO TABS
20.0000 mg | ORAL_TABLET | Freq: Every day | ORAL | Status: DC
  Administered 2015-01-21 – 2015-01-24 (×4): 20 mg via ORAL
  Filled 2015-01-21 (×4): qty 1

## 2015-01-21 MED ORDER — ROSUVASTATIN CALCIUM 10 MG PO TABS
5.0000 mg | ORAL_TABLET | Freq: Every day | ORAL | Status: DC
  Administered 2015-01-22 – 2015-01-26 (×5): 5 mg via ORAL
  Filled 2015-01-21 (×6): qty 1

## 2015-01-21 MED ORDER — DILTIAZEM HCL 100 MG IV SOLR
5.0000 mg/h | Freq: Once | INTRAVENOUS | Status: AC
  Administered 2015-01-21: 5 mg/h via INTRAVENOUS
  Filled 2015-01-21: qty 100

## 2015-01-21 MED ORDER — RIVAROXABAN 20 MG PO TABS
20.0000 mg | ORAL_TABLET | Freq: Every day | ORAL | Status: DC
  Filled 2015-01-21: qty 1

## 2015-01-21 MED ORDER — PANTOPRAZOLE SODIUM 40 MG PO TBEC
40.0000 mg | DELAYED_RELEASE_TABLET | Freq: Every day | ORAL | Status: DC
  Administered 2015-01-22 – 2015-01-26 (×5): 40 mg via ORAL
  Filled 2015-01-21 (×5): qty 1

## 2015-01-21 MED ORDER — DILTIAZEM HCL 25 MG/5ML IV SOLN
10.0000 mg | Freq: Once | INTRAVENOUS | Status: AC
  Administered 2015-01-21: 10 mg via INTRAVENOUS
  Filled 2015-01-21: qty 5

## 2015-01-21 MED ORDER — METOPROLOL TARTRATE 25 MG PO TABS
25.0000 mg | ORAL_TABLET | Freq: Two times a day (BID) | ORAL | Status: DC
  Administered 2015-01-21 – 2015-01-22 (×2): 25 mg via ORAL
  Filled 2015-01-21 (×2): qty 1

## 2015-01-21 MED ORDER — CHLORTHALIDONE 25 MG PO TABS
12.5000 mg | ORAL_TABLET | Freq: Every day | ORAL | Status: DC
  Administered 2015-01-22: 12.5 mg via ORAL
  Filled 2015-01-21: qty 0.5

## 2015-01-21 NOTE — ED Notes (Signed)
Pt's HR is between 100-120, Diltiazem rate changed to 7.5mg /hr. Pt is asymtomatic

## 2015-01-21 NOTE — ED Notes (Signed)
[facility]- pt coming from PCP office for a follow up for diarrhea and pt was noted to be in a-fib with RVR. Pt has no hx of this. Pt sees a cardiologist after having a previous MI. Pt has no complaints of chest pain, weakness, or shortness of breath.

## 2015-01-21 NOTE — ED Provider Notes (Signed)
CSN: [id number]     Arrival date & time 01/21/15  1846 History   First MD Initiated Contact with Patient 01/21/15 1853     Chief Complaint  Patient presents with  . Atrial Fibrillation     (Consider location/radiation/quality/duration/timing/severity/associated sxs/prior Treatment) HPI Comments: Pt states that she is here because her pcp send her over here. She states that she was at his office today because she hasn't been feeling well over the last 5 weeks. She states that she had an episode of dizziness that lasted about 1 day and resolved on its own. She states that she has had some sob and she swelling in her lower extremities. She states that in 5 days she gained 10 pounds. She states that she has since been taking the fluid pill and lost the wt but  She feels like she needs more pillows to sleep at night. Denies any cp. She states that she has also been having diarrhea but that is her baseline.  The history is provided by the patient. No language interpreter was used.    Past Medical History  Diagnosis Date  . Hypertension   . GERD (gastroesophageal reflux disease)   . Dyslipidemia   . Hypokalemia   . Acalculous cholecystitis   . Dizzy   . Fatigue   . PONV (postoperative nausea and vomiting)     yrs ago  . Coronary artery disease     mi  7/12,  stress test 12/21/2011=>negative for ischemia [name])  . Myocardial infarction ([place])     7/12  . Shortness of breath   . Blood transfusion   . Arthritis   . Mitral regurgitation     mild to moderate, cardiac echo 12/02/2011, EF 51% [name])  . Diabetes mellitus     metformin taken off-dr never dx'd her for DM-no meds, no sugar checks -no per dr Maudie Mercury  . Blood transfusion without reported diagnosis   . Cataract     bilateral-removed from both eyes  . Glaucoma    Past Surgical History  Procedure Laterality Date  . Parathyroidectomy      tumor excision  . Abdominal hysterectomy    . Cataract extraction      2  . Coronary  angioplasty with stent placement  09/24/10    drug eluting stents  . Shoulder surgery      right  . Biliary drainage  9/12  . Cholecystectomy  02/25/2011    Procedure: LAPAROSCOPIC CHOLECYSTECTOMY WITH INTRAOPERATIVE CHOLANGIOGRAM;  Surgeon: Gayland Curry, MD;  Location: [facility];  Service: General;  Laterality: N/A;  . Sigmoidoscopy      flex sig in the 80's per pt.   Family History  Problem Relation Age of Onset  . Heart disease Father   . Heart disease Brother   . Melanoma Son   . Cancer Maternal Grandfather   . Colon cancer Neg Hx   . Rectal cancer Neg Hx   . Stomach cancer Neg Hx    Social History  Substance Use Topics  . Smoking status: Never Smoker   . Smokeless tobacco: Never Used  . Alcohol Use: 0.0 oz/week    0 Standard drinks or equivalent per week     Comment: socially-few drinks on the weekends   OB History    No data available     Review of Systems  All other systems reviewed and are negative.     Allergies  Dilaudid; Ativan; Codeine; and Sulfur  Home Medications  Prior to Admission medications   Medication Sig Start Date End Date Taking? Authorizing Provider  aspirin 81 MG tablet Take 81 mg by mouth daily.     Yes Historical Provider, MD  chlorthalidone (HYGROTON) 25 MG tablet Take 12.5 mg by mouth daily.   Yes Historical Provider, MD  esomeprazole (NEXIUM) 40 MG capsule Take 40 mg by mouth daily before breakfast.     Yes Historical Provider, MD  loperamide (IMODIUM) 2 MG capsule Take 2 mg by mouth as needed for diarrhea or loose stools.   Yes Historical Provider, MD  methocarbamol (ROBAXIN) 500 MG tablet Take 500-1,000 mg by mouth every 6 (six) hours as needed. For spasm.   Yes Historical Provider, MD  metoprolol tartrate (LOPRESSOR) 25 MG tablet Take 25 mg by mouth 2 (two) times daily.   Yes Historical Provider, MD  nitroGLYCERIN (NITROSTAT) 0.4 MG SL tablet Place 0.4 mg under the tongue every 5 (five) minutes as needed for chest pain.   Yes Historical  Provider, MD  Propylene Glycol (SYSTANE BALANCE OP) Apply 1 drop to eye daily as needed (dry eyes).   Yes Historical Provider, MD  rosuvastatin (CRESTOR) 5 MG tablet Take 5 mg by mouth daily.     Yes Historical Provider, MD  valsartan (DIOVAN) 320 MG tablet Take 160 mg by mouth daily.    Yes Historical Provider, MD   BP 127/75 mmHg  Pulse 81  Temp(Src) 97.7 F (36.5 C) (Oral)  Resp 17  Ht 5\' 2"  (1.575 m)  Wt 217 lb (98.431 kg)  BMI 39.68 kg/m2  SpO2 98% Physical Exam  Constitutional: She is oriented to person, place, and time. She appears well-developed and well-nourished.  Cardiovascular: Normal rate.   Pulmonary/Chest: Effort normal and breath sounds normal.  Abdominal: Soft. Bowel sounds are normal. There is no tenderness.  Musculoskeletal: Normal range of motion. She exhibits edema.  Neurological: She is alert and oriented to person, place, and time.  Skin: Skin is warm and dry.  Psychiatric: She has a normal mood and affect.  Nursing note and vitals reviewed.   ED Course  Procedures (including critical care time) Labs Review Labs Reviewed  COMPREHENSIVE METABOLIC PANEL - Abnormal; Notable for the following:    Sodium 126 (*)    Chloride 84 (*)    Glucose, Bld 104 (*)    Calcium 8.5 (*)    Total Protein 5.7 (*)    Albumin 2.9 (*)    GFR calc non Af Amer 57 (*)    All other components within normal limits  BRAIN NATRIURETIC PEPTIDE - Abnormal; Notable for the following:    B Natriuretic Peptide 373.9 (*)    All other components within normal limits  CBC WITH DIFFERENTIAL/PLATELET  TROPONIN I  URINALYSIS, ROUTINE W REFLEX MICROSCOPIC (NOT AT [facility])  I-STAT CG4 LACTIC ACID, ED    Imaging Review Dg Chest 2 View  01/21/2015  CLINICAL DATA:  Atrial fibrillation, dizziness, weakness EXAM: CHEST  2 VIEW COMPARISON:  Chest x-ray dated 02/23/2011. FINDINGS: Study is hypoinspiratory with crowding of the perihilar bronchovascular markings. Given the low lung volumes, lungs  are clear except for chronic scarring at the left lung base laterally. Heart size is upper normal, also likely accentuated by the low lung volumes. Mild degenerative change noted within the thoracic spine. No acute osseous abnormality. IMPRESSION: Hypoinspiratory exam with low lung volumes.  No acute findings. Electronically Signed   By: Franki Cabot M.D.   On: 01/21/2015 19:40   I have  personally reviewed and evaluated these images and lab results as part of my medical decision-making.   EKG Interpretation   Date/Time:  Tuesday January 21 2015 18:53:02 EDT Ventricular Rate:  129 PR Interval:    QRS Duration: 92 QT Interval:  346 QTC Calculation: 507 R Axis:   31 Text Interpretation:  Atrial fibrillation Low voltage, precordial leads  Borderline T abnormalities, anterior leads Prolonged QT interval Atrial  fibrillation new since last tracing.  Confirmed by Gerald Leitz  [phone]) on 01/21/2015 7:25:54 PM      MDM   Final diagnoses:  Atrial fibrillation, unspecified type ([place])    Pt to be admitted to tele by Dr. Jenkins Rouge. Pt is in agreement with plan. Pt has not been rate controlled but it due to have increase in cardizem drip    Glendell Docker, NP 01/21/15 2135  Courteney Julio Alm, MD 01/21/15 2343

## 2015-01-22 ENCOUNTER — Ambulatory Visit (HOSPITAL_COMMUNITY): Payer: Medicare Other

## 2015-01-22 LAB — BRAIN NATRIURETIC PEPTIDE: B Natriuretic Peptide: 240.3 pg/mL — ABNORMAL HIGH (ref 0.0–100.0)

## 2015-01-22 LAB — TSH: TSH: 0.802 u[IU]/mL (ref 0.350–4.500)

## 2015-01-22 MED ORDER — METHOCARBAMOL 500 MG PO TABS
500.0000 mg | ORAL_TABLET | Freq: Four times a day (QID) | ORAL | Status: DC | PRN
  Administered 2015-01-22 – 2015-01-25 (×6): 500 mg via ORAL
  Filled 2015-01-22 (×6): qty 1

## 2015-01-22 MED ORDER — FUROSEMIDE 10 MG/ML IJ SOLN
40.0000 mg | Freq: Once | INTRAMUSCULAR | Status: AC
  Administered 2015-01-22: 40 mg via INTRAVENOUS
  Filled 2015-01-22: qty 4

## 2015-01-22 MED ORDER — METOPROLOL TARTRATE 25 MG PO TABS
25.0000 mg | ORAL_TABLET | Freq: Three times a day (TID) | ORAL | Status: DC
  Administered 2015-01-22 – 2015-01-23 (×3): 25 mg via ORAL
  Filled 2015-01-22 (×3): qty 1

## 2015-01-22 MED ORDER — DILTIAZEM HCL 100 MG IV SOLR
5.0000 mg/h | Freq: Once | INTRAVENOUS | Status: AC
  Administered 2015-01-22: 7.5 mg/h via INTRAVENOUS
  Filled 2015-01-22: qty 100

## 2015-01-22 MED ORDER — DILTIAZEM HCL ER COATED BEADS 120 MG PO CP24
120.0000 mg | ORAL_CAPSULE | Freq: Every day | ORAL | Status: DC
  Administered 2015-01-22 – 2015-01-23 (×2): 120 mg via ORAL
  Filled 2015-01-22 (×2): qty 1

## 2015-01-22 MED ORDER — POTASSIUM CHLORIDE CRYS ER 20 MEQ PO TBCR
20.0000 meq | EXTENDED_RELEASE_TABLET | Freq: Two times a day (BID) | ORAL | Status: AC
  Administered 2015-01-22 (×2): 20 meq via ORAL
  Filled 2015-01-22 (×2): qty 1

## 2015-01-22 NOTE — Progress Notes (Signed)
  Echocardiogram 2D Echocardiogram has been performed.  [name] M 01/22/2015, 10:47 AM

## 2015-01-22 NOTE — Progress Notes (Signed)
UR Completed Hadiyah Maricle Graves-Bigelow, RN,BSN [phone]  

## 2015-01-22 NOTE — H&P (Signed)
[name] is an 81 y.o. female.   Chief Complaint: New onset a.fib with RVR HPI: [name]  is a 81 y.o. female with known coronary artery disease (non-ST elevation myocardial infarction and underwent coronary and pasty to proximal LAD on 09/24/2010), HTN, HLD, and well controlled type 2 diabetes. She was last seen in the office 2 months agoat which time she was doing well.    She went to her PCP yesterday with complaints of fatigue, intermittent dizziness, SOB, orthopnea, lower extremity edema, and weight gain.  She states symptoms began approximately 5 weeks ago.  She was noted to be in a.fib with RVR and was sent to the ED for further evaluation.    Past Medical History  Diagnosis Date  . Hypertension   . GERD (gastroesophageal reflux disease)   . Dyslipidemia   . Hypokalemia   . Acalculous cholecystitis   . Dizzy   . Fatigue   . PONV (postoperative nausea and vomiting)     yrs ago  . Coronary artery disease     mi  7/12,  stress test 12/21/2011=>negative for ischemia [name])  . Myocardial infarction ([place])     7/12  . Shortness of breath   . Blood transfusion   . Arthritis   . Mitral regurgitation     mild to moderate, cardiac echo 12/02/2011, EF 51% [name])  . Diabetes mellitus     metformin taken off-dr never dx'd her for DM-no meds, no sugar checks -no per dr Maudie Mercury  . Blood transfusion without reported diagnosis   . Cataract     bilateral-removed from both eyes  . Glaucoma     Past Surgical History  Procedure Laterality Date  . Parathyroidectomy      tumor excision  . Abdominal hysterectomy    . Cataract extraction      2  . Coronary angioplasty with stent placement  09/24/10    drug eluting stents  . Shoulder surgery      right  . Biliary drainage  9/12  . Cholecystectomy  02/25/2011    Procedure: LAPAROSCOPIC CHOLECYSTECTOMY WITH INTRAOPERATIVE CHOLANGIOGRAM;  Surgeon: Gayland Curry, MD;  Location: [facility];  Service: General;  Laterality: N/A;   . Sigmoidoscopy      flex sig in the 80's per pt.    Family History  Problem Relation Age of Onset  . Heart disease Father   . Heart disease Brother   . Melanoma Son   . Cancer Maternal Grandfather   . Colon cancer Neg Hx   . Rectal cancer Neg Hx   . Stomach cancer Neg Hx    Social History:  reports that she has never smoked. She has never used smokeless tobacco. She reports that she drinks alcohol. She reports that she does not use illicit drugs.  Allergies:  Allergies  Allergen Reactions  . Dilaudid [Hydromorphone Hcl] Other (See Comments)    Caused patient psychological disturbances   . Ativan [Lorazepam] Other (See Comments)    Childlike behavior  . Codeine Other (See Comments)    Caused psychological disturbances   . Sulfur Rash    Review of Systems - History obtained from the patient General ROS: positive for  - fatigue, malaise and weight gain negative for - chills or fever Respiratory ROS: positive for - orthopnea and shortness of breath negative for - cough or hemoptysis Cardiovascular ROS: positive for - dyspnea on exertion and edema negative for - chest pain, loss of consciousness  or palpitations Gastrointestinal ROS: positive for - diarrhea Neurological ROS: no TIA or stroke symptoms  Blood pressure 118/62, pulse 75, temperature 97.8 F (36.6 C), temperature source Oral, resp. rate 22, height '5\' 2"'  (1.575 m), weight 98.748 kg (217 lb 11.2 oz), SpO2 91 %.   General appearance: alert, cooperative, no distress and moderately obese Eyes: negative Neck: no adenopathy, no carotid bruit, no JVD, supple, symmetrical, trachea midline and thyroid not enlarged, symmetric, no tenderness/mass/nodules Resp: rales RLL Chest wall: no tenderness Cardio: irregularly irregular rhythm, S1: variable, S2: normal and no S3 or S4 GI: soft, non-tender; bowel sounds normal; no masses,  no organomegaly Extremities: edema trace to 1+ Pulses: 2+ and symmetric Skin: Skin color,  texture, turgor normal. No rashes or lesions Neurologic: Grossly normal  Results for orders placed or performed during the hospital encounter of 01/21/15 (from the past 48 hour(s))  Comprehensive metabolic panel     Status: Abnormal   Collection Time: 01/21/15  7:58 PM  Result Value Ref Range   Sodium 126 (L) 135 - 145 mmol/L   Potassium 3.5 3.5 - 5.1 mmol/L   Chloride 84 (L) 101 - 111 mmol/L   CO2 30 22 - 32 mmol/L   Glucose, Bld 104 (H) 65 - 99 mg/dL   BUN 12 6 - 20 mg/dL   Creatinine, Ser 0.93 0.44 - 1.00 mg/dL   Calcium 8.5 (L) 8.9 - 10.3 mg/dL   Total Protein 5.7 (L) 6.5 - 8.1 g/dL   Albumin 2.9 (L) 3.5 - 5.0 g/dL   AST 18 15 - 41 U/L   ALT 22 14 - 54 U/L   Alkaline Phosphatase 74 38 - 126 U/L   Total Bilirubin 1.1 0.3 - 1.2 mg/dL   GFR calc non Af Amer 57 (L) >60 mL/min   GFR calc Af Amer >60 >60 mL/min    Comment: (NOTE) The eGFR has been calculated using the CKD EPI equation. This calculation has not been validated in all clinical situations. eGFR's persistently <60 mL/min signify possible Chronic Kidney Disease.    Anion gap 12 5 - 15  CBC with Differential     Status: None   Collection Time: 01/21/15  7:58 PM  Result Value Ref Range   WBC 6.4 4.0 - 10.5 K/uL   RBC 3.98 3.87 - 5.11 MIL/uL   Hemoglobin 12.8 12.0 - 15.0 g/dL   HCT 37.4 36.0 - 46.0 %   MCV 94.0 78.0 - 100.0 fL   MCH 32.2 26.0 - 34.0 pg   MCHC 34.2 30.0 - 36.0 g/dL   RDW 13.4 11.5 - 15.5 %   Platelets 209 150 - 400 K/uL   Neutrophils Relative % 56 %   Neutro Abs 3.6 1.7 - 7.7 K/uL   Lymphocytes Relative 32 %   Lymphs Abs 2.1 0.7 - 4.0 K/uL   Monocytes Relative 11 %   Monocytes Absolute 0.7 0.1 - 1.0 K/uL   Eosinophils Relative 1 %   Eosinophils Absolute 0.1 0.0 - 0.7 K/uL   Basophils Relative 0 %   Basophils Absolute 0.0 0.0 - 0.1 K/uL  Troponin I     Status: None   Collection Time: 01/21/15  7:58 PM  Result Value Ref Range   Troponin I 0.03 <0.031 ng/mL    Comment:        NO INDICATION  OF MYOCARDIAL INJURY.   Brain natriuretic peptide     Status: Abnormal   Collection Time: 01/21/15  7:58 PM  Result Value Ref  Range   B Natriuretic Peptide 373.9 (H) 0.0 - 100.0 pg/mL  I-Stat CG4 Lactic Acid, ED     Status: None   Collection Time: 01/21/15  8:08 PM  Result Value Ref Range   Lactic Acid, Venous 1.16 0.5 - 2.0 mmol/L  Urinalysis, Routine w reflex microscopic (not at [facility])     Status: None   Collection Time: 01/21/15  8:15 PM  Result Value Ref Range   Color, Urine YELLOW YELLOW   APPearance CLEAR CLEAR   Specific Gravity, Urine 1.008 1.005 - 1.030   pH 7.0 5.0 - 8.0   Glucose, UA NEGATIVE NEGATIVE mg/dL   Hgb urine dipstick NEGATIVE NEGATIVE   Bilirubin Urine NEGATIVE NEGATIVE   Ketones, ur NEGATIVE NEGATIVE mg/dL   Protein, ur NEGATIVE NEGATIVE mg/dL   Urobilinogen, UA 0.2 0.0 - 1.0 mg/dL   Nitrite NEGATIVE NEGATIVE   Leukocytes, UA NEGATIVE NEGATIVE    Comment: MICROSCOPIC NOT DONE ON URINES WITH NEGATIVE PROTEIN, BLOOD, LEUKOCYTES, NITRITE, OR GLUCOSE <1000 mg/dL.   Dg Chest 2 View  01/21/2015  CLINICAL DATA:  Atrial fibrillation, dizziness, weakness EXAM: CHEST  2 VIEW COMPARISON:  Chest x-ray dated 02/23/2011. FINDINGS: Study is hypoinspiratory with crowding of the perihilar bronchovascular markings. Given the low lung volumes, lungs are clear except for chronic scarring at the left lung base laterally. Heart size is upper normal, also likely accentuated by the low lung volumes. Mild degenerative change noted within the thoracic spine. No acute osseous abnormality. IMPRESSION: Hypoinspiratory exam with low lung volumes.  No acute findings. Electronically Signed   By: Franki Cabot M.D.   On: 01/21/2015 19:40    Labs:   Lab Results  Component Value Date   WBC 6.4 01/21/2015   HGB 12.8 01/21/2015   HCT 37.4 01/21/2015   MCV 94.0 01/21/2015   PLT 209 01/21/2015    Recent Labs Lab 01/21/15 1958  NA 126*  K 3.5  CL 84*  CO2 30  BUN 12  CREATININE 0.93   CALCIUM 8.5*  PROT 5.7*  BILITOT 1.1  ALKPHOS 74  ALT 22  AST 18  GLUCOSE 104*    Lipid Panel     Component Value Date/Time   CHOL 118 09/24/2010 1000   TRIG 39 09/24/2010 1000   HDL 65 09/24/2010 1000   CHOLHDL 1.8 09/24/2010 1000   VLDL 8 09/24/2010 1000   LDLCALC 45 09/24/2010 1000    BNP (last 3 results)  Recent Labs  01/21/15 1958  BNP 373.9*    ProBNP (last 3 results) No results for input(s): PROBNP in the last 8760 hours.  HEMOGLOBIN A1C Lab Results  Component Value Date   HGBA1C 6.2* 09/28/2010   MPG 131* 09/28/2010    Cardiac Panel (last 3 results)  Recent Labs  01/21/15 1958  TROPONINI 0.03    Lab Results  Component Value Date   CKTOTAL 40 12/11/2010   CKMB 2.4 12/11/2010   TROPONINI 0.03 01/21/2015     TSH No results for input(s): TSH in the last 8760 hours.  EKG 01/21/2015: atrial fibrillation with RVR at a rate of 130bpm, normal axis, nonspecific T wave abnormality. Compared to EKG in office on 11/21/2014, a.fib is new.  Medications Prior to Admission  Medication Sig Dispense Refill  . aspirin 81 MG tablet Take 81 mg by mouth daily.      . chlorthalidone (HYGROTON) 25 MG tablet Take 12.5 mg by mouth daily.    Marland Kitchen esomeprazole (NEXIUM) 40 MG capsule Take 40  mg by mouth daily before breakfast.      . loperamide (IMODIUM) 2 MG capsule Take 2 mg by mouth as needed for diarrhea or loose stools.    . methocarbamol (ROBAXIN) 500 MG tablet Take 500-1,000 mg by mouth every 6 (six) hours as needed. For spasm.    . metoprolol tartrate (LOPRESSOR) 25 MG tablet Take 25 mg by mouth 2 (two) times daily.    . nitroGLYCERIN (NITROSTAT) 0.4 MG SL tablet Place 0.4 mg under the tongue every 5 (five) minutes as needed for chest pain.    Marland Kitchen Propylene Glycol (SYSTANE BALANCE OP) Apply 1 drop to eye daily as needed (dry eyes).    . rosuvastatin (CRESTOR) 5 MG tablet Take 5 mg by mouth daily.      . valsartan (DIOVAN) 320 MG tablet Take 160 mg by mouth daily.          Current facility-administered medications:  .  chlorthalidone (HYGROTON) tablet 12.5 mg, 12.5 mg, Oral, Daily, Glendell Docker, NP .  irbesartan (AVAPRO) tablet 75 mg, 75 mg, Oral, Daily, Glendell Docker, NP .  loperamide (IMODIUM) capsule 2 mg, 2 mg, Oral, PRN, Glendell Docker, NP .  metoprolol tartrate (LOPRESSOR) tablet 25 mg, 25 mg, Oral, BID, Glendell Docker, NP, 25 mg at 01/21/15 2146 .  pantoprazole (PROTONIX) EC tablet 40 mg, 40 mg, Oral, Daily, Glendell Docker, NP, 40 mg at 01/22/15 0007 .  rivaroxaban (XARELTO) tablet 20 mg, 20 mg, Oral, Daily, Glendell Docker, NP, 20 mg at 01/21/15 2146 .  rosuvastatin (CRESTOR) tablet 5 mg, 5 mg, Oral, Daily, Glendell Docker, NP  Assessment/Plan 1. New onset atrial fibrillation with RVR (CHA2DS2-VASCScore: Risk Score  6,  Yearly risk of stroke  9.8%. Recommendation: ASA: No/Anticoagulation: Yes.  [place] Has Bled: Score 1.  Estimated risk of major bleeding at 1 year with [place] 1.02-1.5%) 2. CAD (Heart catheterization 09/24/10: PCI to proximal and mid LAD with 4x18 and 3.5x9 Resolute DES, D1 3.0x12 Resolute stent, Kissing balloon angioplasty. Normal circumflex and RCA. Normal LVEF) 3. Benign Essential Hypertension 4. Controlled Type 2 Diabetes Mellitus 5. Hyperlipidemia  Recommendation: pt in new onset atrial fibrillation, likely began 5 weeks ago when she first began experiencing dizziness and lightheadedness.  No chest pain, even with elevated HR, and cardiac enzymes negative.  Will obtain echocardiogram to evaluate left atrial size (mild to moderately dilated on last echo in 2013) as well as to evaluate for cardiomyopathy due to edema, weight gain, and elevated BNP.  Will transition IV cardizem to PO, continue BB.  Consider antiarrhythmic therapy based on results of echo.  If she remains in a.fib, can consider cardioversion on an outpatient basis after adequate period of anticoagulation.  She has already been started on Xarelto due to elevated  CVA risk.    Rachel Bo, NP-C 01/22/2015, 7:23 AM [facility] Pager: [phone] Office: [phone]

## 2015-01-23 LAB — BASIC METABOLIC PANEL
ANION GAP: 9 (ref 5–15)
BUN: 16 mg/dL (ref 6–20)
CALCIUM: 8.2 mg/dL — AB (ref 8.9–10.3)
CO2: 31 mmol/L (ref 22–32)
Chloride: 87 mmol/L — ABNORMAL LOW (ref 101–111)
Creatinine, Ser: 1 mg/dL (ref 0.44–1.00)
GFR, EST NON AFRICAN AMERICAN: 53 mL/min — AB (ref >60)
Glucose, Bld: 106 mg/dL — ABNORMAL HIGH (ref 65–99)
Potassium: 3.2 mmol/L — ABNORMAL LOW (ref 3.5–5.1)
SODIUM: 127 mmol/L — AB (ref 135–145)

## 2015-01-23 LAB — BRAIN NATRIURETIC PEPTIDE: B NATRIURETIC PEPTIDE 5: 220.2 pg/mL — AB (ref 0.0–100.0)

## 2015-01-23 MED ORDER — METOPROLOL TARTRATE 50 MG PO TABS
50.0000 mg | ORAL_TABLET | Freq: Two times a day (BID) | ORAL | Status: DC
  Administered 2015-01-23 – 2015-01-25 (×4): 50 mg via ORAL
  Filled 2015-01-23 (×4): qty 1

## 2015-01-23 MED ORDER — AMIODARONE HCL 200 MG PO TABS
400.0000 mg | ORAL_TABLET | Freq: Three times a day (TID) | ORAL | Status: DC
  Administered 2015-01-23 – 2015-01-26 (×9): 400 mg via ORAL
  Filled 2015-01-23 (×9): qty 2

## 2015-01-23 MED ORDER — DILTIAZEM HCL ER COATED BEADS 180 MG PO CP24: 180.0000 mg | ORAL_CAPSULE | Freq: Every day | ORAL | Status: DC

## 2015-01-23 NOTE — Progress Notes (Signed)
HR 80-100 overnight. This AM with pt ambulating in room, HR up to 150-170. Pt asymptomatic. HR down to 90-110 when sitting in chair. Ronnette Hila, RN

## 2015-01-23 NOTE — Care Management Note (Addendum)
Case Management Note  Patient Details  Name: SULAF FINLEY MRN: [id number] Date of Birth: 05-24-1933  Subjective/Objective:  Pt admitted for A Fib. Plan to return home on medication Xarelto.                   Action/Plan: Benefits check in process for Xarelto. CM will make pt aware of cost once completed.    Expected Discharge Date:                  Expected Discharge Plan:  Home/Self Care  In-House Referral:  NA  Discharge planning Services  CM Consult  Post Acute Care Choice:  NA Choice offered to:  NA  DME Arranged:  N/A DME Agency:  NA  HH Arranged:  NA HH Agency:  NA  Status of Service:  Completed, signed off  Medicare Important Message Given:    Date Medicare IM Given:    Medicare IM give by:    Date Additional Medicare IM Given:    Additional Medicare Important Message give by:     If discussed at [place] of Stay Meetings, dates discussed:    Additional Comments:  S/W TAMERA @ [employer] [facility] [phone]   XARELTO 20 MG [place]- $181.96  TIER- 3 DRUG  PRIOR APPROVAL-NO.  CM did call [facility] and medication is available.  Bethena Roys, RN 01/23/2015, 10:14 AM

## 2015-01-23 NOTE — Progress Notes (Signed)
CARDIAC REHAB PHASE I   PRE:  Rate/Rhythm: 100 afib    BP: sitting 101/51    SaO2: 95 RA  MODE:  Ambulation: to sink   POST:  Rate/Rhythm: 145 afib    BP: sitting 103/84     SaO2: 95 RA  Pt became nauseated after washing hands at sink. HR 145 afib. Had pt sit, unable to tolerated more walking. HR slowed with rest. Gave pt Off the [place] book and HF booklet. Began ed discussion, difficult due to pts talkative nature. Encouraged her to read books and watch videos tonight. Will f/u in am. [id number]   [name] CES, ACSM 01/23/2015 2:23 PM

## 2015-01-23 NOTE — Progress Notes (Signed)
RN reviewed tele-strips for brief bursts of bradycardia. Pt asymptomatic, VS taken wnl. MD notified of 3.55 sec pause in heartrate. Will continue to monitor closely.

## 2015-01-23 NOTE — Progress Notes (Signed)
Subjective:  She feels better today, states that her dyspnea has improved but, over notices that with activity her heart rate does elevate. She has had -2500 mL since diuresis yesterday with furosemide.  Objective:  Vital Signs in the last 24 hours: Temp:  [97.4 F (36.3 C)-98.7 F (37.1 C)] 97.4 F (36.3 C) (10/27 1430) Pulse Rate:  [55-105] 55 (10/27 0449) BP: (116-131)/(54-74) 131/74 mmHg (10/27 0449) SpO2:  [94 %-96 %] 94 % (10/27 0449) Weight:  [98.748 kg (217 lb 11.2 oz)] 98.748 kg (217 lb 11.2 oz) (10/27 0449)  Body mass index is 39.81 kg/(m^2).   Intake/Output from previous day: 10/26 0701 - 10/27 0700 In: 650 [P.O.:650] Out: 3100 [Urine:3100]  Physical Exam:   General appearance: alert, cooperative, no distress and nearly morbidly obese Eyes: negative Neck: no adenopathy, no carotid bruit, no JVD, supple, symmetrical, trachea midline and thyroid not enlarged, symmetric, no tenderness/mass/nodules Resp: rales RLL Chest wall: no tenderness Cardio: irregularly irregular rhythm, S1: variable, S2: normal and no S3 or S4 GI: soft, non-tender; bowel sounds normal; no masses, no organomegaly Extremities: edema trace to 1+ Pulses: 2+ and symmetric Skin: Skin color, texture, turgor normal. No rashes or lesions Neurologic: Grossly normal  Lab Results: BMP  Recent Labs  01/21/15 1958 01/23/15 0319  NA 126* 127*  K 3.5 3.2*  CL 84* 87*  CO2 30 31  GLUCOSE 104* 106*  BUN 12 16  CREATININE 0.93 1.00  CALCIUM 8.5* 8.2*  GFRNONAA 57* 53*  GFRAA >60 >60    CBC  Recent Labs Lab 01/21/15 1958  WBC 6.4  RBC 3.98  HGB 12.8  HCT 37.4  PLT 209  MCV 94.0  MCH 32.2  MCHC 34.2  RDW 13.4  LYMPHSABS 2.1  MONOABS 0.7  EOSABS 0.1  BASOSABS 0.0   TSH  Recent Labs  01/22/15 0757  TSH 0.802   Cardiac Panel (last 3 results)  Recent Labs  01/21/15 1958  TROPONINI 0.03    BNP (last 3 results)  Recent Labs  01/21/15 1958 01/22/15 0958 01/23/15 0319   BNP 373.9* 240.3* 220.2*   Cardiac Studies:  EKG 01/21/2015: Atrial fibrillation with rapid ventricular response, nonspecific T abnormality.  Telemetry 01/23/15: A. fib with RVR.  Rate much better improved and controlled compared to yesterday.  Echocardiogram 01/22/2015: Left frontal plus size is normal, mild LVH, EF 60-65%.  Mitral annular calcification, mild mitral regurgitation.  Mild urinary hypertension, PA pressure 34 mmHg.   Assessment/Plan:  1. New onset atrial fibrillation with RVR (CHA2DS2-VASCScore: Risk Score 6, Yearly risk of stroke 9.8%. Recommendation: ASA: No/Anticoagulation: Yes. [place] Has Bled: Score 1. Estimated risk of major bleeding at 1 year with [place] 1.02-1.5%) 2. CAD (Heart catheterization 09/24/10: PCI to proximal and mid LAD with 4x18 and 3.5x9 Resolute DES, D1 3.0x12 Resolute stent, Kissing balloon angioplasty. Normal circumflex and RCA. Normal LVEF) 3. Benign Essential Hypertension 4. Controlled Type 2 Diabetes Mellitus 5. Hyperlipidemia 6.  Near morbid obesity 7.  Hyponatremia due to acute on chronic diastolic heart failure.  Recommendation: This morning I had increased the dose of metoprolol to 50 mg by mouth twice a day, also increased diltiazem from 120-180 mg.  In spite of this she continues to have A. Fib with RVR, cardiac rehabilitation when not able to walk her due to RVR.  Hence I have started her on amiodarone 400 mg by mouth 3 times a day for rate control for now.  I'll consider cardioversion after she has been on anticoagulation for  3-4 weeks.  If rate is controlled, I would potentially consider discharge from the hospital in the morning.   [name], M.D. 01/23/2015, 6:43 PM [facility] Pager: [phone] Office: [phone] If no answer: [phone]

## 2015-01-24 LAB — BASIC METABOLIC PANEL
ANION GAP: 12 (ref 5–15)
BUN: 17 mg/dL (ref 6–20)
CALCIUM: 8.4 mg/dL — AB (ref 8.9–10.3)
CO2: 29 mmol/L (ref 22–32)
CREATININE: 1.15 mg/dL — AB (ref 0.44–1.00)
Chloride: 87 mmol/L — ABNORMAL LOW (ref 101–111)
GFR calc Af Amer: 51 mL/min — ABNORMAL LOW (ref >60)
GFR, EST NON AFRICAN AMERICAN: 44 mL/min — AB (ref >60)
GLUCOSE: 115 mg/dL — AB (ref 65–99)
Potassium: 3.4 mmol/L — ABNORMAL LOW (ref 3.5–5.1)
Sodium: 128 mmol/L — ABNORMAL LOW (ref 135–145)

## 2015-01-24 LAB — BRAIN NATRIURETIC PEPTIDE: B Natriuretic Peptide: 219.5 pg/mL — ABNORMAL HIGH (ref 0.0–100.0)

## 2015-01-24 MED ORDER — DILTIAZEM HCL ER COATED BEADS 120 MG PO CP24
120.0000 mg | ORAL_CAPSULE | Freq: Every day | ORAL | Status: DC
  Administered 2015-01-24 – 2015-01-26 (×2): 120 mg via ORAL
  Filled 2015-01-24 (×3): qty 1

## 2015-01-24 NOTE — Progress Notes (Signed)
Subjective:  She continues to feel better but still feels short of breath when ambulating. Cardizem held due to 3.5 sec pause, however, pt's HR not 120-140.  Objective:  Vital Signs in the last 24 hours: Temp:  [97.4 F (36.3 C)-98.9 F (37.2 C)] 98.3 F (36.8 C) (10/28 0445) Pulse Rate:  [74-135] 86 (10/28 0445) Resp:  [17-18] 17 (10/27 2325) BP: (99-116)/(57-89) 112/68 mmHg (10/28 0445) SpO2:  [94 %-96 %] 94 % (10/28 0445) Weight:  [98.385 kg (216 lb 14.4 oz)] 98.385 kg (216 lb 14.4 oz) (10/28 0445)  Body mass index is 39.66 kg/(m^2).   Intake/Output from previous day: 10/27 0701 - 10/28 0700 In: 120 [P.O.:120] Out: 500 [Urine:500]  Physical Exam: General appearance: alert, cooperative, no distress and nearly morbidly obese Eyes: negative Neck: no adenopathy, no carotid bruit, no JVD, supple, symmetrical, trachea midline and thyroid not enlarged, symmetric, no tenderness/mass/nodules Resp: rales RLL Chest wall: no tenderness Cardio: irregularly irregular rhythm, S1: variable, S2: normal and no S3 or S4 GI: soft, non-tender; bowel sounds normal; no masses, no organomegaly Extremities: edema trace to 1+ Pulses: 2+ and symmetric Skin: Skin color, texture, turgor normal. No rashes or lesions Neurologic: Grossly normal  Lab Results: BMP  Recent Labs  01/21/15 1958 01/23/15 0319 01/24/15 0337  NA 126* 127* 128*  K 3.5 3.2* 3.4*  CL 84* 87* 87*  CO2 30 31 29   GLUCOSE 104* 106* 115*  BUN 12 16 17   CREATININE 0.93 1.00 1.15*  CALCIUM 8.5* 8.2* 8.4*  GFRNONAA 57* 53* 44*  GFRAA >60 >60 51*    CBC  Recent Labs Lab 01/21/15 1958  WBC 6.4  RBC 3.98  HGB 12.8  HCT 37.4  PLT 209  MCV 94.0  MCH 32.2  MCHC 34.2  RDW 13.4  LYMPHSABS 2.1  MONOABS 0.7  EOSABS 0.1  BASOSABS 0.0   TSH  Recent Labs  01/22/15 0757  TSH 0.802   Cardiac Panel (last 3 results)  Recent Labs  01/21/15 1958  TROPONINI 0.03    BNP (last 3 results)  Recent Labs   01/22/15 0958 01/23/15 0319 01/24/15 0337  BNP 240.3* 220.2* 219.5*   Cardiac Studies:  EKG 01/21/2015: Atrial fibrillation with rapid ventricular response at a rate of 130, nonspecific T abnormality.  EKG 01/24/2015: a.fib at a rate of 96 bpm, nonspecific T abnormality.  Telemetry 01/24/15: A. fib with RVR, 120-140  Echocardiogram 01/22/2015: LV size is normal, mild LVH, EF 60-65%.  Mitral annular calcification, mild mitral regurgitation.  Mild pulmonary hypertension, PA pressure 34 mmHg.   Assessment/Plan:  1. New onset atrial fibrillation with RVR (CHA2DS2-VASCScore: Risk Score 6, Yearly risk of stroke 9.8%. Recommendation: ASA: No/Anticoagulation: Yes. [place] Has Bled: Score 1. Estimated risk of major bleeding at 1 year with [place] 1.02-1.5%) 2. CAD (Heart catheterization 09/24/10: PCI to proximal and mid LAD with 4x18 and 3.5x9 Resolute DES, D1 3.0x12 Resolute stent, Kissing balloon angioplasty. Normal circumflex and RCA. Normal LVEF) 3. Benign Essential Hypertension 4. Controlled Type 2 Diabetes Mellitus 5. Hyperlipidemia 6.  Near morbid obesity 7.  Hyponatremia due to acute on chronic diastolic heart failure.  Recommendation: She remains in a.fib 120-140.  Resume cardizem. Continue to monitor. Plan for d/c in the morning if stable.   Rachel Bo, NP-C 01/24/2015, 8:18 AM [facility] Pager: [phone] Office: [phone]

## 2015-01-24 NOTE — Clinical Documentation Improvement (Signed)
Cardiology, Cardiology NP's  Can the diagnosis of CHF be further specified?   Acute on Chronic Diastolic CHF as documented  Chronic Diastolic CHF  Other  Clinically Undetermined  Document any associated diagnoses/conditions  Supporting Information:  BNP's running 374, 240 for current admission  Only on PO Lopressor 50 mg twice a day  No IV diuretics ordered  Please exercise your independent, professional judgment when responding. A specific answer is not anticipated or expected.  Thank You,  Zoila Shutter RN, [place] [phone]

## 2015-01-24 NOTE — Progress Notes (Signed)
Pt ambulated approximitely 250 ft. HR remained under 110 while ambulating. No complaints from the pt.

## 2015-01-24 NOTE — Progress Notes (Signed)
Pt sleeping. RN sts her HR was higher this am with activity but she has not walked this pm. He will walk with her later. Will f/u as time permits. Yves Dill CES, ACSM 2:40 PM. 01/24/2015

## 2015-01-25 LAB — BASIC METABOLIC PANEL
ANION GAP: 13 (ref 5–15)
ANION GAP: 12 (ref 5–15)
BUN: 26 mg/dL — ABNORMAL HIGH (ref 6–20)
BUN: 26 mg/dL — ABNORMAL HIGH (ref 6–20)
CHLORIDE: 89 mmol/L — AB (ref 101–111)
CHLORIDE: 85 mmol/L — AB (ref 101–111)
CO2: 27 mmol/L (ref 22–32)
CO2: 26 mmol/L (ref 22–32)
Calcium: 8.4 mg/dL — ABNORMAL LOW (ref 8.9–10.3)
Calcium: 7.8 mg/dL — ABNORMAL LOW (ref 8.9–10.3)
Creatinine, Ser: 1.41 mg/dL — ABNORMAL HIGH (ref 0.44–1.00)
Creatinine, Ser: 1.59 mg/dL — ABNORMAL HIGH (ref 0.44–1.00)
GFR calc non Af Amer: 35 mL/min — ABNORMAL LOW (ref >60)
GFR calc non Af Amer: 30 mL/min — ABNORMAL LOW (ref >60)
GFR, EST AFRICAN AMERICAN: 40 mL/min — AB (ref >60)
GFR, EST AFRICAN AMERICAN: 35 mL/min — AB (ref >60)
Glucose, Bld: 113 mg/dL — ABNORMAL HIGH (ref 65–99)
Glucose, Bld: 153 mg/dL — ABNORMAL HIGH (ref 65–99)
POTASSIUM: 3.5 mmol/L (ref 3.5–5.1)
POTASSIUM: 3.1 mmol/L — AB (ref 3.5–5.1)
SODIUM: 123 mmol/L — AB (ref 135–145)
Sodium: 129 mmol/L — ABNORMAL LOW (ref 135–145)

## 2015-01-25 LAB — URINALYSIS, ROUTINE W REFLEX MICROSCOPIC
Bilirubin Urine: NEGATIVE
Glucose, UA: NEGATIVE mg/dL
Hgb urine dipstick: NEGATIVE
Ketones, ur: NEGATIVE mg/dL
Leukocytes, UA: NEGATIVE
NITRITE: NEGATIVE
PROTEIN: NEGATIVE mg/dL
SPECIFIC GRAVITY, URINE: 1.012 (ref 1.005–1.030)
UROBILINOGEN UA: 0.2 mg/dL (ref 0.0–1.0)
pH: 5.5 (ref 5.0–8.0)

## 2015-01-25 MED ORDER — METOPROLOL TARTRATE 25 MG PO TABS
25.0000 mg | ORAL_TABLET | Freq: Two times a day (BID) | ORAL | Status: DC
  Administered 2015-01-25 – 2015-01-26 (×2): 25 mg via ORAL
  Filled 2015-01-25 (×2): qty 1

## 2015-01-25 MED ORDER — RIVAROXABAN 15 MG PO TABS: 15.0000 mg | ORAL_TABLET | Freq: Every day | ORAL | Status: DC

## 2015-01-25 MED ORDER — RIVAROXABAN 15 MG PO TABS
15.0000 mg | ORAL_TABLET | Freq: Every day | ORAL | Status: DC
  Administered 2015-01-25 – 2015-01-26 (×2): 15 mg via ORAL
  Filled 2015-01-25 (×2): qty 1

## 2015-01-25 MED ORDER — POTASSIUM CHLORIDE CRYS ER 20 MEQ PO TBCR
20.0000 meq | EXTENDED_RELEASE_TABLET | ORAL | Status: AC
  Administered 2015-01-25 (×2): 20 meq via ORAL
  Filled 2015-01-25 (×4): qty 1

## 2015-01-25 MED ORDER — DILTIAZEM HCL ER COATED BEADS 120 MG PO CP24: 120.0000 mg | ORAL_CAPSULE | Freq: Every day | ORAL | Status: DC

## 2015-01-25 MED ORDER — ZOLPIDEM TARTRATE 5 MG PO TABS
5.0000 mg | ORAL_TABLET | Freq: Every evening | ORAL | Status: DC | PRN
  Administered 2015-01-25: 5 mg via ORAL
  Filled 2015-01-25: qty 1

## 2015-01-25 MED ORDER — AMIODARONE HCL 400 MG PO TABS
400.0000 mg | ORAL_TABLET | Freq: Two times a day (BID) | ORAL | Status: DC
Start: 2015-01-25 — End: 2017-11-11

## 2015-01-25 NOTE — Care Management Important Message (Signed)
Important Message  Patient Details  Name: [name] MRN: [id number] Date of Birth: [birth year]   Medicare Important Message Given:  Yes-second notification given    Norina Buzzard, RN 01/25/2015, 10:07 AM

## 2015-01-25 NOTE — Discharge Summary (Signed)
Physician Discharge Summary  Patient ID: [name] MRN: [id number] DOB/AGE: [birth year] 81 y.o.  Admit date: 01/21/2015 Discharge date: 01/26/2015  Primary Discharge Diagnosis 1. New onset atrial fibrillation with RVR (CHA2DS2-VASCScore: Risk Score 6, Yearly risk of stroke 9.8%. Recommendation: ASA: No/Anticoagulation: Yes. Iraan Has Bled: Score 1. Estimated risk of major bleeding at 1 year with [place] 1.02-1.5%)  Secondary Discharge Diagnosis 2. CAD (Heart catheterization 09/24/10: PCI to proximal and mid LAD with 4x18 and 3.5x9 Resolute DES, D1 3.0x12 Resolute stent, Kissing balloon angioplasty. Normal circumflex and RCA. Normal LVEF) 3. Benign Essential Hypertension 4. Controlled Type 2 Diabetes Mellitus 5. Hyperlipidemia 6. Near morbid obesity 7. Hyponatremia due to acute on chronic diastolic heart failure, new A. Fibrillation, and chlorthalidone use. CHF resolved. 8. Chronic kidney disease from diabetes mellitus stage III.  Significant Diagnostic Studies: EKG 01/21/2015: Atrial fibrillation with rapid ventricular response, nonspecific T abnormality.  Echocardiogram 01/22/2015: Left frontal plus size is normal, mild LVH, EF 60-65%. Mitral annular calcification, mild mitral regurgitation. Mild urinary hypertension, PA pressure 34 mmHg.  Hospital Course: Patient admitted on 01/21/2015, discharged 01/26/2015 with shortness of breath, fatigue, new onset atrial fibrillation with rapid ventricular response.  In spite of increasing beta blockers and addition of calcium channel blocker, heart rate continued to be elevated. Hence patient was started on amiodarone with improvement in heart rate control. Due to acute on chronic diastolic heart failure, patient received one dose of intravenous furosemide with improvement in BNP.  Patient was also noted to have hyponatremia on admission with worsening renal function during admission. It could have been related to acute on chronic  diastolic heart failure, new onset A. Fib, and/or prehospital chlorthalidone use.  On the day of discharge, patient remained stable, denied any dyspnea, heart rate much better controlled, hence will be discharged home with outpatient follow-up in one week. She will also need outpatient BMP to follow-up on hyponatremia and renal function.  Discharge Exam: Blood pressure 132/73, pulse 80, temperature 97.9 F (36.6 C), temperature source Oral, resp. rate 16, height 5\' 2"  (1.575 m), weight 97.024 kg (213 lb 14.4 oz), SpO2 97 %.   General appearance: alert, cooperative, no distress and nearly morbidly obese Eyes: negative Neck: no adenopathy, no carotid bruit, no JVD, supple, symmetrical, trachea midline and thyroid not enlarged, symmetric, no tenderness/mass/nodules Resp: rales RLL Chest wall: no tenderness Cardio: irregularly irregular rhythm, S1: variable, S2: normal and no S3 or S4 GI: soft, non-tender; bowel sounds normal; no masses, no organomegaly Extremities: edema trace to 1+ Pulses: 2+ and symmetric Skin: Skin color, texture, turgor normal. No rashes or lesions Neurologic: Grossly normal  Labs:   Lab Results  Component Value Date   WBC 6.4 01/21/2015   HGB 12.8 01/21/2015   HCT 37.4 01/21/2015   MCV 94.0 01/21/2015   PLT 209 01/21/2015    Recent Labs Lab 01/21/15 1958  01/26/15 0423  NA 126*  < > 122*  K 3.5  < > 3.7  CL 84*  < > 84*  CO2 30  < > 27  BUN 12  < > 28*  CREATININE 0.93  < > 1.52*  CALCIUM 8.5*  < > 7.9*  PROT 5.7*  --   --   BILITOT 1.1  --   --   ALKPHOS 74  --   --   ALT 22  --   --   AST 18  --   --   GLUCOSE 104*  < > 99  < > =  values in this interval not displayed.  Lipid Panel     Component Value Date/Time   CHOL 118 09/24/2010 1000   TRIG 39 09/24/2010 1000   HDL 65 09/24/2010 1000   CHOLHDL 1.8 09/24/2010 1000   VLDL 8 09/24/2010 1000   LDLCALC 45 09/24/2010 1000    BNP (last 3 results)  Recent Labs  01/22/15 0958  01/23/15 0319 01/24/15 0337  BNP 240.3* 220.2* 219.5*    HEMOGLOBIN A1C Lab Results  Component Value Date   HGBA1C 6.2* 09/28/2010   MPG 131* 09/28/2010    Cardiac Panel (last 3 results)  Recent Labs  01/21/15 1958  TROPONINI 0.03    Lab Results  Component Value Date   CKTOTAL 40 12/11/2010   CKMB 2.4 12/11/2010   TROPONINI 0.03 01/21/2015     TSH  Recent Labs  01/22/15 0757  TSH 0.802    Radiology: Dg Chest 2 View  01/21/2015  CLINICAL DATA:  Atrial fibrillation, dizziness, weakness EXAM: CHEST  2 VIEW COMPARISON:  Chest x-ray dated 02/23/2011. FINDINGS: Study is hypoinspiratory with crowding of the perihilar bronchovascular markings. Given the low lung volumes, lungs are clear except for chronic scarring at the left lung base laterally. Heart size is upper normal, also likely accentuated by the low lung volumes. Mild degenerative change noted within the thoracic spine. No acute osseous abnormality. IMPRESSION: Hypoinspiratory exam with low lung volumes.  No acute findings. Electronically Signed   By: Franki Cabot M.D.   On: 01/21/2015 19:40   FOLLOW UP PLANS AND APPOINTMENTS    Medication List    STOP taking these medications        aspirin 81 MG tablet     chlorthalidone 25 MG tablet  Commonly known as:  HYGROTON      TAKE these medications        amiodarone 400 MG tablet  Commonly known as:  PACERONE  Take 1 tablet (400 mg total) by mouth 2 (two) times daily.     diltiazem 120 MG 24 hr capsule  Commonly known as:  CARDIZEM CD  Take 1 capsule (120 mg total) by mouth daily.     esomeprazole 40 MG capsule  Commonly known as:  NEXIUM  Take 40 mg by mouth daily before breakfast.     loperamide 2 MG capsule  Commonly known as:  IMODIUM  Take 2 mg by mouth as needed for diarrhea or loose stools.     methocarbamol 500 MG tablet  Commonly known as:  ROBAXIN  Take 500-1,000 mg by mouth every 6 (six) hours as needed. For spasm.     metoprolol  tartrate 25 MG tablet  Commonly known as:  LOPRESSOR  Take 25 mg by mouth 2 (two) times daily.     nitroGLYCERIN 0.4 MG SL tablet  Commonly known as:  NITROSTAT  Place 0.4 mg under the tongue every 5 (five) minutes as needed for chest pain.     Rivaroxaban 15 MG Tabs tablet  Commonly known as:  XARELTO  Take 1 tablet (15 mg total) by mouth daily with supper.     rosuvastatin 5 MG tablet  Commonly known as:  CRESTOR  Take 5 mg by mouth daily.     SYSTANE BALANCE OP  Apply 1 drop to eye daily as needed (dry eyes).     valsartan 320 MG tablet  Commonly known as:  DIOVAN  Take 160 mg by mouth daily.  Follow-up Information    Follow up with [name], MD.   Specialty:  Cardiology   Why:  We will call you to see you in 1 week.   Contact information:   [address]  [place] [address] [phone]        Rachel Bo, NP-C 01/25/2015, 10:36 AM [facility]  Pager: [phone] Office: [phone]

## 2015-01-25 NOTE — Progress Notes (Signed)
MD verbal order to call when new BMP results resulted in EPIC. This RN called, Einar Gip, MD with results. MD verbal order to change Metoprolol to 25 mg BID, repeat BMP in morning, and order a UA. Verbal order also to hold Cardizem and call MD if SBP less than 100.

## 2015-01-25 NOTE — Progress Notes (Signed)
Pt BP 100/75. MD made aware. MD verbal order to hold Avapro and Cardizem at this time. Will continue to Monitor.

## 2015-01-25 NOTE — Discharge Instructions (Signed)
Atrial Fibrillation °Atrial fibrillation is a type of heartbeat that is irregular or fast (rapid). If you have this condition, your heart keeps quivering in a weird (chaotic) way. This condition can make it so your heart cannot pump blood normally. Having this condition gives a person more risk for stroke, heart failure, and other heart problems. There are different types of atrial fibrillation. Talk with your doctor to learn about the type that you have. °HOME CARE °· Take over-the-counter and prescription medicines only as told by your doctor. °· If your doctor prescribed a blood-thinning medicine, take it exactly as told. Taking too much of it can cause bleeding. If you do not take enough of it, you will not have the protection that you need against stroke and other problems. °· Do not use any tobacco products. These include cigarettes, chewing tobacco, and e-cigarettes. If you need help quitting, ask your doctor. °· If you have apnea (obstructive sleep apnea), manage it as told by your doctor. °· Do not drink alcohol. °· Do not drink beverages that have caffeine. These include coffee, soda, and tea. °· Maintain a healthy weight. Do not use diet pills unless your doctor says they are safe for you. Diet pills may make heart problems worse. °· Follow diet instructions as told by your doctor. °· Exercise regularly as told by your doctor. °· Keep all follow-up visits as told by your doctor. This is important. °GET HELP IF: °· You notice a change in the speed, rhythm, or strength of your heartbeat. °· You are taking a blood-thinning medicine and you notice more bruising. °· You get tired more easily when you move or exercise. °GET HELP RIGHT AWAY IF: °· You have pain in your chest or your belly (abdomen). °· You have sweating or weakness. °· You feel sick to your stomach (nauseous). °· You notice blood in your throw up (vomit), poop (stool), or pee (urine). °· You are short of breath. °· You suddenly have swollen feet  and ankles. °· You feel dizzy. °· Your suddenly get weak or numb in your face, arms, or legs, especially if it happens on one side of your body. °· You have trouble talking, trouble understanding, or both. °· Your face or your eyelid droops on one side. °These symptoms may be an emergency. Do not wait to see if the symptoms will go away. Get medical help right away. Call your local emergency services (911 in the [place] Do not drive yourself to the hospital. °  °This information is not intended to replace advice given to you by your health care provider. Make sure you discuss any questions you have with your health care provider. °  °Document Released: 12/23/2007 Document Revised: 12/04/2014 Document Reviewed: 07/10/2014 °Elsevier Interactive Patient Education ©2016 [employer] ° °

## 2015-01-26 LAB — BASIC METABOLIC PANEL
Anion gap: 11 (ref 5–15)
BUN: 28 mg/dL — AB (ref 6–20)
CHLORIDE: 84 mmol/L — AB (ref 101–111)
CO2: 27 mmol/L (ref 22–32)
CREATININE: 1.52 mg/dL — AB (ref 0.44–1.00)
Calcium: 7.9 mg/dL — ABNORMAL LOW (ref 8.9–10.3)
GFR calc Af Amer: 37 mL/min — ABNORMAL LOW (ref >60)
GFR, EST NON AFRICAN AMERICAN: 32 mL/min — AB (ref >60)
GLUCOSE: 99 mg/dL (ref 65–99)
POTASSIUM: 3.7 mmol/L (ref 3.5–5.1)
SODIUM: 122 mmol/L — AB (ref 135–145)

## 2015-01-26 NOTE — Progress Notes (Signed)
Discharge paperwork gone over in detail with patient and son. All questions answered to patient satisfaction. Patient discharged to home by wheelchair with son. IV removed intact.

## 2015-02-03 DIAGNOSIS — I4891 Unspecified atrial fibrillation: Secondary | ICD-10-CM | POA: Diagnosis not present

## 2015-02-03 DIAGNOSIS — I2510 Atherosclerotic heart disease of native coronary artery without angina pectoris: Secondary | ICD-10-CM | POA: Diagnosis not present

## 2015-02-03 DIAGNOSIS — Z9861 Coronary angioplasty status: Secondary | ICD-10-CM | POA: Diagnosis not present

## 2015-02-03 DIAGNOSIS — I10 Essential (primary) hypertension: Secondary | ICD-10-CM | POA: Diagnosis not present

## 2015-02-06 DIAGNOSIS — I4891 Unspecified atrial fibrillation: Secondary | ICD-10-CM | POA: Diagnosis not present

## 2015-02-09 NOTE — H&P (Signed)
OFFICE VISIT NOTES COPIED TO EPIC FOR DOCUMENTATION  [name] 02/20/15 3:23 PM Location: [facility] Patient #: [id number] DOB: Mar 22, 1934 Widowed / Language: [name] / Race: White Female   History of Present Illness [name] Page MD; 02-20-2015 10:35 PM) Patient words: F/U post hospital visit.  The patient is a 81 year old female who presents for a follow-up for Follow-up for Atrial fibrillation.  Additional reasons for visit:  Follow-up for Coronary artery disease is described as the following: Caucasian female with known coronary artery disease and is had non-ST elevation myocardial infarction and angioplasty to proximal LAD on 09/24/2010. She has severe back pain and arthritis and degenerative disc disease and has markedly reduced her activity due to this.   She was admitted to the hospital with chest pain, shortness of breath, atrial fibrillation with rapid ventricular response in October 2016, state in the hospital for about 5-6 days and discharged home with acute diastolic on chronic systolic heart failure, severe hyponatremia and electrolyte imbalance and acute renal insufficiency probably due to combination of low blood pressure, chronic chlorthalidone that she was on prior to the hospitalization, atrial fibrillation contributing with volume excess due to hyponatremia.  Curbside consultation with [name] had suggested fluid restriction and avoidance of diuretics and to reevaluate labs and symptoms in the outpatient basis. She is feeling well and is noticed improvement in dyspnea and presents for follow-up. She's concerned about the cost of medications.   Problem List/Past Medical [name]; 02/20/15 3:33 PM) Hyperlipidemia, group A (E78.00) Labs 10/03/2014: CBC normal, total cholesterol 175, triglycerides 45, HDL 91, LDL 75 Labs 04/10/2014: CBC normal, creatinine 1.1, potassium 3.7, CMP normal, HbA1c 6.1%, total cholesterol 158,  triglycerides 68, HDL 80, LDL 44, TSH 0.460 Lipid profile 10/04/2012: Total cholesterol 186, triglycerides 56, HDL 84, LDL 91. Non-HDL cholesterol 102. CBC, CMP normal. HbA1c 5.3%. Labs 08/30/2012: CBC, CMP normal, hemoglobin A1c 5.8%. Total cholesterol 173, triglycerides 74, HDL 88, LDL 70. Coronary artery disease involving native coronary artery of native heart without angina pectoris (I25.10) Heart catheterization 09/24/10: PCI to proximal and mid LAD with 4x18 and 3.5x9 Resolute DES, D1 3.0x12 Resolute stent, Kissing balloon angioplasty. Normal circumflex and RCA. Normal LVEF. Controlled type 2 diabetes mellitus without complication, without long-term current use of insulin (E11.9) Labs 10/03/2014: HbA1c 5.7%, Essential hypertension, benign (I10) History of chronic back pain (Z87.39) S/P PTCA (percutaneous transluminal coronary angioplasty) (Z98.61)09/04/2010  Allergies ([name]; 02/20/15 3:33 PM) SulfADIAZINE *SULFONAMIDES* Rash. Dilaudid *ANALGESICS - OPIOID* Hallucinations  Family History ([name]; 02/20/15 3:33 PM) Father died at age 92 from a MI; had heart disease. Mother died at age 28 from old age; had dementia. Siblings 2 (1 living). Living brother has heart disease;h/o heart surgery?  Social History [name]; 2015/02/20 3:33 PM) Current tobacco use Never smoker. Alcohol Use Occasional alcohol use. Marital status Widowed. Living Situation Lives alone. Number of Children 2.  Past Surgical History [name]; Feb 20, 2015 3:33 PM) Underwent Cholecystectomy 02/25/11 Hysterectomy 1990 Right shoulder surgery 2009 2 cataract surgeries 2008 Parathyroid removed 1985.  Laser Surgery08/01/2015 Left. For Glaucoma  Medication History [name] Page, MD; 2015-02-20 3:59 PM) Nitrostat (0.4MG  Tab Sublingual, 1 (one) Tab Sublingual Tab Sub Sublingual one tablet under the tongue as needed for chest pain, Taken starting 01/26/2015)  Active. Metoprolol Tartrate (25MG  Tablet, 1 (one) Tablet Tablet Tablet Oral two times daily, Taken starting 05/20/2014) Active. NexIUM (40MG  Capsule DR, 1 tablet Oral once a day before breakfast) Active. Robaxin (500MG  Tablet,  1 tablets Oral prn) Active. Systane Ultra preserved ophthalmic solution (daily prn) Active. Crestor (5MG  Tablet, 1 tablet Oral once a day) Active. Diovan (320MG  Tablet, 1/2 - 1 tablet Oral once a day (after a meal) Reduced to 1/2 due to orthostatic hypotension) Active. Chlorthalidone (25MG  Tablet, 1/2 tablet Oral as needed for dyspnea and leg edema) Active. (She takes it rarely) Imodium A-D (2MG  Tablet, 1 Oral as needed) Active. Amiodarone HCl (400MG  Tablet, 1 Oral two times daily) Active. Cartia XT (120MG  Capsule ER 24HR, 1 Oral daily) Active. Xarelto (15MG  Tablet, 1 Oral daily) Active. Medications Reconciled  Diagnostic Studies History [name] Page, MD; 02/03/2015 10:41 PM) Stress EKG 12/21/11: 2 min exercise time. Marked dyspnea. Hypertensive. No EKG changes. Non diagnosti echo 12/02/11: Normal LVEF. Mild to mod MR. Mild to mod LA enlargement. No significant TR, hence PA pr ECG 11/12/11: NSR, normal intervals. No ischemia. Normal EKG. Stress cardiolite 1990 Colonoscopy03/2016 2 Benign polyps were removed Labwork Labs 10/03/2014: CBC normal, total cholesterol 175, triglycerides 45, HDL 91, LDL 75 Labs 04/10/2014: CBC normal, creatinine 1.1, potassium 3.7, CMP normal, HbA1c 6.1%, total cholesterol 158, triglycerides 68, HDL 80, LDL 44, TSH 0.460 Lipid profile 10/04/2012: Total cholesterol 186, triglycerides 56, HDL 84, LDL 91. Non-HDL cholesterol 102. CBC, CMP normal. HbA1c 5.3%. Labs 08/30/2012: CBC, CMP normal, hemoglobin A1c 5.8%. Total cholesterol 173, triglycerides 74, HDL 88, LDL 70.  Other Problems [name]; 02/03/2015 3:33 PM) History of left heart catheterization [id number]) Unspecified Diagnosis    Review of Systems [name] Page,  MD; 02/03/2015 10:40 PM) General Present- Feeling well. Not Present- Fatigue, Fever and Night Sweats. Respiratory Not Present- Decreased Exercise Tolerance, Difficulty Breathing and Difficulty Breathing on Exertion. Cardiovascular Not Present- Chest Pain, Claudications, Orthopnea, Palpitations, Paroxysmal Nocturnal Dyspnea and Swelling of Extremities. Gastrointestinal Present- Diarrhea (chronic) and Heartburn. Not Present- Abdominal Pain, Nausea and Vomiting. Musculoskeletal Present- Backache and Joint Pain (arthritis of the knees and hip and back). Neurological Not Present- Headaches. Hematology Not Present- Blood Clots, Easy Bruising and Nose Bleed.  Vitals [name]; 02/03/2015 3:33 PM) 02/03/2015 3:30 PM Weight: 209 lb Height: 62in Body Surface Area: 1.95 m Body Mass Index: 38.23 kg/m  Pulse: 54 (Regular)  P.OX: 94% (Room air) BP: 118/64 (Sitting, Left Arm, Standard)       Physical Exam [name] Page MD; 02/03/2015 10:36 PM) General Mental Status-Alert. General Appearance-Cooperative, Appears stated age, Not in acute distress. Orientation-Oriented X3. Build & Nutrition-Moderately built and Moderately obese.  Head and Neck Thyroid Gland Characteristics - no palpable nodules, no palpable enlargement.  Chest and Lung Exam Palpation Tender - No chest wall tenderness. Auscultation Breath sounds - Clear.  Cardiovascular Inspection Jugular vein - Right - No Distention. Auscultation Rhythm - Irregular. Heart Sounds - S1 is variable, S2 WNL and No gallop present. Murmurs & Other Heart Sounds - Murmur - No murmur.  Abdomen Inspection Contour - Obese and Pannus present. Palpation/Percussion Normal exam - Non Tender and No hepatosplenomegaly. Auscultation Normal exam - Bowel sounds normal.  Peripheral Vascular Lower Extremity Inspection - Left - No Pigmentation, No Varicose veins. Right - No Pigmentation, No Varicose veins. Palpation - Edema  - Left - No edema. Right - No edema. Femoral pulse - Left - Normal. Right - Normal. Popliteal pulse - Left - Normal. Right - Normal. Dorsalis pedis pulse - Left - Normal. Right - Normal. Posterior tibial pulse - Left - Normal. Right - Normal. Carotid arteries - Left-No Carotid bruit. Carotid arteries - Right-No Carotid bruit.  Abdomen-No prominent abdominal aortic pulsation, No epigastric bruit.  Neurologic Motor-Grossly intact without any focal deficits.  Musculoskeletal Global Assessment Left Lower Extremity - normal range of motion without pain. Right Lower Extremity - normal range of motion without pain.    Assessment & Plan [name] Page MD; 02/03/2015 10:41 PM) Atrial fibrillation, new onset (I48.91) Story: CHA2DS2-VASc Score is 5 with yearly risk of stroke of 6.7%. Impression: EKG 02/03/2015: Atrial fibrillation with controlled ventricular response at the rate of 90 bpm, normal axis, nonspecific T abnormality. Current Plans Started Xarelto 15MG , 1 Tablet daily, #30, 02/03/2015, Ref. x1. Complete electrocardiogram (93000) Coronary artery disease involving native coronary artery of native heart without angina pectoris (I25.10) Story: Heart catheterization 09/24/10: PCI to proximal and mid LAD with 4x18 and 3.5x9 Resolute DES, D1 3.0x12 Resolute stent, Kissing balloon angioplasty. Normal circumflex and RCA. Normal LVEF. Impression: EKG 11/19/2013: Normal sinus rhythm at the rate of 70 bpm, normal intervals. No evidence of ischemia, normal EKG. No change from EKG 11/16/2012 S/P PTCA (percutaneous transluminal coronary angioplasty) (Z98.61) Essential hypertension, benign (I10) Hyperlipidemia, group A (E78.00) Controlled type 2 diabetes mellitus without complication, without long-term current use of insulin (E11.9) Story: Labs 10/03/2014: HbA1c 5.7%, History of chronic back pain (Z87.39) Current Plans Mechanism of underlying disease process and action of medications discussed  with the patient. I have reviewed all her medications again with the patient, to help or assistance regarding Xarelto, we will obtain assistance from the company, however with regard to valsartan, she can certainly switched to losartan to see whether it would be cheaper and also she is on 5 mg of Crestor which can be switched to 40 mg of pravastatin for cost saving purposes. A mural although expensive in her insurance, she will be on a small dose. I will set her up for cardioversion in 10 days to 2 weeks in the hopes of maintaining sinus rhythm and improve diastolic compliance. Weight loss was again stressed to the patient. No changes in her medications were done today. Office visit after cardioversion.  Schedule for Direct current cardioversion. I have discussed regarding risks benefits rate control vs rhythm control with the patient. Patient understands cardiac arrest and need for CPR, aspiration pneumonia, but not limited to these. Patient is willing.  Labwork Story: Labs 10/03/2014: CBC normal, total cholesterol 175, triglycerides 45, HDL 91, LDL 75  Labs 04/10/2014: CBC normal, creatinine 1.1, potassium 3.7, CMP normal, HbA1c 6.1%, total cholesterol 158, triglycerides 68, HDL 80, LDL 44, TSH 0.460  Lipid profile 10/04/2012: Total cholesterol 186, triglycerides 56, HDL 84, LDL 91. Non-HDL cholesterol 102. CBC, CMP normal. HbA1c 5.3%.  Labs 08/30/2012: CBC, CMP normal, hemoglobin A1c 5.8%. Total cholesterol 173, triglycerides 74, HDL 88, LDL 70.    Signed by [name] Page, MD (02/03/2015 10:41 PM)

## 2015-02-12 ENCOUNTER — Encounter (HOSPITAL_COMMUNITY)
Admission: RE | Disposition: A | Discharge status: Home / Self Care | Payer: Self-pay | Source: Ambulatory Visit | Attending: Cardiology

## 2015-02-12 ENCOUNTER — Encounter (HOSPITAL_COMMUNITY): Payer: Self-pay | Admitting: *Deleted

## 2015-02-12 ENCOUNTER — Ambulatory Visit (HOSPITAL_COMMUNITY): Payer: Medicare Other | Admitting: Certified Registered Nurse Anesthetist

## 2015-02-12 ENCOUNTER — Ambulatory Visit (HOSPITAL_COMMUNITY)
Admission: RE | Admit: 2015-02-12 | Discharge: 2015-02-12 | Disposition: A | Discharge status: Home / Self Care | Payer: Medicare Other | Source: Ambulatory Visit | Attending: Cardiology | Admitting: Cardiology

## 2015-02-12 DIAGNOSIS — Z955 Presence of coronary angioplasty implant and graft: Secondary | ICD-10-CM | POA: Insufficient documentation

## 2015-02-12 DIAGNOSIS — I2510 Atherosclerotic heart disease of native coronary artery without angina pectoris: Secondary | ICD-10-CM | POA: Diagnosis not present

## 2015-02-12 DIAGNOSIS — I5042 Chronic combined systolic (congestive) and diastolic (congestive) heart failure: Secondary | ICD-10-CM | POA: Insufficient documentation

## 2015-02-12 DIAGNOSIS — I10 Essential (primary) hypertension: Secondary | ICD-10-CM | POA: Diagnosis not present

## 2015-02-12 DIAGNOSIS — Z79899 Other long term (current) drug therapy: Secondary | ICD-10-CM | POA: Diagnosis not present

## 2015-02-12 DIAGNOSIS — E119 Type 2 diabetes mellitus without complications: Secondary | ICD-10-CM | POA: Insufficient documentation

## 2015-02-12 DIAGNOSIS — I4891 Unspecified atrial fibrillation: Secondary | ICD-10-CM | POA: Diagnosis present

## 2015-02-12 DIAGNOSIS — Z7901 Long term (current) use of anticoagulants: Secondary | ICD-10-CM | POA: Diagnosis not present

## 2015-02-12 DIAGNOSIS — G8929 Other chronic pain: Secondary | ICD-10-CM | POA: Diagnosis not present

## 2015-02-12 DIAGNOSIS — I252 Old myocardial infarction: Secondary | ICD-10-CM | POA: Diagnosis not present

## 2015-02-12 DIAGNOSIS — Z8249 Family history of ischemic heart disease and other diseases of the circulatory system: Secondary | ICD-10-CM | POA: Diagnosis not present

## 2015-02-12 DIAGNOSIS — M1990 Unspecified osteoarthritis, unspecified site: Secondary | ICD-10-CM | POA: Diagnosis not present

## 2015-02-12 DIAGNOSIS — R001 Bradycardia, unspecified: Secondary | ICD-10-CM | POA: Diagnosis not present

## 2015-02-12 DIAGNOSIS — E7800 Pure hypercholesterolemia, unspecified: Secondary | ICD-10-CM | POA: Diagnosis not present

## 2015-02-12 HISTORY — PX: CARDIOVERSION: SHX1299

## 2015-02-12 SURGERY — CARDIOVERSION
Anesthesia: Monitor Anesthesia Care

## 2015-02-12 MED ORDER — SODIUM CHLORIDE 0.9 % IV SOLN
INTRAVENOUS | Status: DC
  Administered 2015-02-12 (×2): via INTRAVENOUS

## 2015-02-12 MED ORDER — LIDOCAINE HCL (CARDIAC) 20 MG/ML IV SOLN
INTRAVENOUS | Status: DC | PRN
  Administered 2015-02-12: 30 mg via INTRAVENOUS
  Administered 2015-02-12: 60 mg via INTRAVENOUS
  Administered 2015-02-12: 40 mg via INTRAVENOUS

## 2015-02-12 NOTE — Anesthesia Postprocedure Evaluation (Signed)
  Anesthesia Post-op Note  [name]  Procedure(s) Performed: Procedure(s): CARDIOVERSION (N/A)  Patient Location: Endoscopy Unit  Anesthesia Type:General  Level of Consciousness: awake, alert  and oriented  Airway and Oxygen Therapy: Patient Spontanous Breathing  Post-op Pain: none  Post-op Assessment: Post-op Vital signs reviewed, Patient's Cardiovascular Status Stable, Respiratory Function Stable and Patent Airway              Post-op Vital Signs: stable  Last Vitals:  Filed Vitals:   02/12/15 1330  BP: 122/53  Pulse: 57  Temp:   Resp: 13    Complications: No apparent anesthesia complications

## 2015-02-12 NOTE — Interval H&P Note (Signed)
History and Physical Interval Note:  02/12/2015 12:45 PM  [name]  has presented today for surgery, with the diagnosis of AFIB  The various methods of treatment have been discussed with the patient and family. After consideration of risks, benefits and other options for treatment, the patient has consented to  Procedure(s): CARDIOVERSION (N/A) as a surgical intervention .  The patient's history has been reviewed, patient examined, no change in status, stable for surgery.  I have reviewed the patient's chart and labs.  Questions were answered to the patient's satisfaction.     [name]

## 2015-02-12 NOTE — Anesthesia Preprocedure Evaluation (Addendum)
Anesthesia Evaluation  Patient identified by MRN, date of birth, ID band Patient awake    Reviewed: Allergy & Precautions, NPO status , Patient's Chart, lab work & pertinent test results  History of Anesthesia Complications (+) PONV  Airway Mallampati: II  TM Distance: >3 FB Neck ROM: Full    Dental  (+) Teeth Intact, Dental Advisory Given   Pulmonary shortness of breath and with exertion,    breath sounds clear to auscultation       Cardiovascular hypertension, Pt. on medications + CAD and + Past MI   Rhythm:Irregular     Neuro/Psych Anxiety Depression    GI/Hepatic GERD  ,  Endo/Other  diabetes  Renal/GU      Musculoskeletal  (+) Arthritis ,   Abdominal   Peds  Hematology   Anesthesia Other Findings   Reproductive/Obstetrics                           Anesthesia Physical Anesthesia Plan  ASA: III  Anesthesia Plan: MAC   Post-op Pain Management:    Induction: Intravenous  Airway Management Planned: Mask  Additional Equipment:   Intra-op Plan:   Post-operative Plan:   Informed Consent: I have reviewed the patients History and Physical, chart, labs and discussed the procedure including the risks, benefits and alternatives for the proposed anesthesia with the patient or authorized representative who has indicated his/her understanding and acceptance.     Plan Discussed with: CRNA and Anesthesiologist  Anesthesia Plan Comments:        Anesthesia Quick Evaluation

## 2015-02-12 NOTE — Discharge Instructions (Signed)
Electrical Cardioversion, Care After °Refer to this sheet in the next few weeks. These instructions provide you with information on caring for yourself after your procedure. Your health care provider may also give you more specific instructions. Your treatment has been planned according to current medical practices, but problems sometimes occur. Call your health care provider if you have any problems or questions after your procedure. °WHAT TO EXPECT AFTER THE PROCEDURE °After your procedure, it is typical to have the following sensations: °· Some redness on the skin where the shocks were delivered. If this is tender, a sunburn lotion or hydrocortisone cream may help. °· Possible return of an abnormal heart rhythm within hours or days after the procedure. °HOME CARE INSTRUCTIONS °· Take medicines only as directed by your health care provider. Be sure you understand how and when to take your medicine. °· Learn how to feel your pulse and check it often. °· Limit your activity for 48 hours after the procedure or as directed by your health care provider. °· Avoid or minimize caffeine and other stimulants as directed by your health care provider. °SEEK MEDICAL CARE IF: °· You feel like your heart is beating too fast or your pulse is not regular. °· You have any questions about your medicines. °· You have bleeding that will not stop. °SEEK IMMEDIATE MEDICAL CARE IF: °· You are dizzy or feel faint. °· It is hard to breathe or you feel short of breath. °· There is a change in discomfort in your chest. °· Your speech is slurred or you have trouble moving an arm or leg on one side of your body. °· You get a serious muscle cramp that does not go away. °· Your fingers or toes turn cold or blue. °  °This information is not intended to replace advice given to you by your health care provider. Make sure you discuss any questions you have with your health care provider. °  °Document Released: 01/03/2013 Document Revised: 04/05/2014  Document Reviewed: 01/03/2013 °Elsevier Interactive Patient Education ©2016 [employer] ° °

## 2015-02-12 NOTE — CV Procedure (Signed)
Direct current cardioversion:  Indication symptomatic A. Fibrillation.  Procedure: Using 100 mg of IV Propofol and 20 IV Lidocaine (for reducing venous pain) for achieving deep sedation, synchronized direct current cardioversion performed. Patient was delivered with 120 Joules of electricity X 1 with success to NSR. Patient tolerated the procedure well. No immediate complication noted.

## 2015-02-12 NOTE — Transfer of Care (Signed)
Immediate Anesthesia Transfer of Care Note  [name]  Procedure(s) Performed: Procedure(s): CARDIOVERSION (N/A)  Patient Location: PACU and Endoscopy Unit  Anesthesia Type:MAC  Level of Consciousness: awake, alert  and oriented  Airway & Oxygen Therapy: Patient Spontanous Breathing and Patient connected to nasal cannula oxygen  Post-op Assessment: Report given to RN and Post -op Vital signs reviewed and stable  Post vital signs: Reviewed and stable  Last Vitals:  Filed Vitals:   02/12/15 1059  BP: 136/73  Pulse: 85  Temp: 36.4 C  Resp: 20    Complications: No apparent anesthesia complications

## 2015-02-13 ENCOUNTER — Encounter (HOSPITAL_COMMUNITY): Payer: Self-pay | Admitting: Cardiology

## 2015-02-24 DIAGNOSIS — I10 Essential (primary) hypertension: Secondary | ICD-10-CM | POA: Diagnosis not present

## 2015-02-24 DIAGNOSIS — E119 Type 2 diabetes mellitus without complications: Secondary | ICD-10-CM | POA: Diagnosis not present

## 2015-02-24 DIAGNOSIS — I2510 Atherosclerotic heart disease of native coronary artery without angina pectoris: Secondary | ICD-10-CM | POA: Diagnosis not present

## 2015-02-24 DIAGNOSIS — I4891 Unspecified atrial fibrillation: Secondary | ICD-10-CM | POA: Diagnosis not present

## 2015-04-10 DIAGNOSIS — I4891 Unspecified atrial fibrillation: Secondary | ICD-10-CM | POA: Diagnosis not present

## 2015-04-10 DIAGNOSIS — I10 Essential (primary) hypertension: Secondary | ICD-10-CM | POA: Diagnosis not present

## 2015-04-10 DIAGNOSIS — I2510 Atherosclerotic heart disease of native coronary artery without angina pectoris: Secondary | ICD-10-CM | POA: Diagnosis not present

## 2015-04-23 DIAGNOSIS — I10 Essential (primary) hypertension: Secondary | ICD-10-CM | POA: Diagnosis not present

## 2015-04-23 DIAGNOSIS — E118 Type 2 diabetes mellitus with unspecified complications: Secondary | ICD-10-CM | POA: Diagnosis not present

## 2015-04-30 DIAGNOSIS — I10 Essential (primary) hypertension: Secondary | ICD-10-CM | POA: Diagnosis not present

## 2015-04-30 DIAGNOSIS — E7800 Pure hypercholesterolemia, unspecified: Secondary | ICD-10-CM | POA: Diagnosis not present

## 2015-04-30 DIAGNOSIS — E119 Type 2 diabetes mellitus without complications: Secondary | ICD-10-CM | POA: Diagnosis not present

## 2015-05-13 DIAGNOSIS — H40052 Ocular hypertension, left eye: Secondary | ICD-10-CM | POA: Diagnosis not present

## 2015-05-13 DIAGNOSIS — H353131 Nonexudative age-related macular degeneration, bilateral, early dry stage: Secondary | ICD-10-CM | POA: Diagnosis not present

## 2015-06-23 DIAGNOSIS — M79671 Pain in right foot: Secondary | ICD-10-CM | POA: Diagnosis not present

## 2015-07-14 DIAGNOSIS — I2510 Atherosclerotic heart disease of native coronary artery without angina pectoris: Secondary | ICD-10-CM | POA: Diagnosis not present

## 2015-07-14 DIAGNOSIS — I10 Essential (primary) hypertension: Secondary | ICD-10-CM | POA: Diagnosis not present

## 2015-07-14 DIAGNOSIS — I480 Paroxysmal atrial fibrillation: Secondary | ICD-10-CM | POA: Diagnosis not present

## 2015-08-05 DIAGNOSIS — H401121 Primary open-angle glaucoma, left eye, mild stage: Secondary | ICD-10-CM | POA: Diagnosis not present

## 2015-08-05 DIAGNOSIS — H353130 Nonexudative age-related macular degeneration, bilateral, stage unspecified: Secondary | ICD-10-CM | POA: Diagnosis not present

## 2015-08-05 DIAGNOSIS — H40001 Preglaucoma, unspecified, right eye: Secondary | ICD-10-CM | POA: Diagnosis not present

## 2015-08-21 DIAGNOSIS — E7800 Pure hypercholesterolemia, unspecified: Secondary | ICD-10-CM | POA: Diagnosis not present

## 2015-08-21 DIAGNOSIS — I10 Essential (primary) hypertension: Secondary | ICD-10-CM | POA: Diagnosis not present

## 2015-08-21 DIAGNOSIS — E119 Type 2 diabetes mellitus without complications: Secondary | ICD-10-CM | POA: Diagnosis not present

## 2015-08-28 DIAGNOSIS — Z0000 Encounter for general adult medical examination without abnormal findings: Secondary | ICD-10-CM | POA: Diagnosis not present

## 2015-08-28 DIAGNOSIS — E7800 Pure hypercholesterolemia, unspecified: Secondary | ICD-10-CM | POA: Diagnosis not present

## 2015-08-28 DIAGNOSIS — E119 Type 2 diabetes mellitus without complications: Secondary | ICD-10-CM | POA: Diagnosis not present

## 2015-08-28 DIAGNOSIS — I10 Essential (primary) hypertension: Secondary | ICD-10-CM | POA: Diagnosis not present

## 2015-08-28 DIAGNOSIS — I2510 Atherosclerotic heart disease of native coronary artery without angina pectoris: Secondary | ICD-10-CM | POA: Diagnosis not present

## 2015-09-16 DIAGNOSIS — Z85828 Personal history of other malignant neoplasm of skin: Secondary | ICD-10-CM | POA: Diagnosis not present

## 2015-09-16 DIAGNOSIS — D1801 Hemangioma of skin and subcutaneous tissue: Secondary | ICD-10-CM | POA: Diagnosis not present

## 2015-09-16 DIAGNOSIS — D225 Melanocytic nevi of trunk: Secondary | ICD-10-CM | POA: Diagnosis not present

## 2015-09-16 DIAGNOSIS — L821 Other seborrheic keratosis: Secondary | ICD-10-CM | POA: Diagnosis not present

## 2015-09-16 DIAGNOSIS — D485 Neoplasm of uncertain behavior of skin: Secondary | ICD-10-CM | POA: Diagnosis not present

## 2015-10-10 DIAGNOSIS — M6701 Short Achilles tendon (acquired), right ankle: Secondary | ICD-10-CM | POA: Diagnosis not present

## 2015-10-10 DIAGNOSIS — M722 Plantar fascial fibromatosis: Secondary | ICD-10-CM | POA: Diagnosis not present

## 2016-02-04 DIAGNOSIS — H40052 Ocular hypertension, left eye: Secondary | ICD-10-CM | POA: Diagnosis not present

## 2016-03-03 DIAGNOSIS — N390 Urinary tract infection, site not specified: Secondary | ICD-10-CM | POA: Diagnosis not present

## 2016-03-03 DIAGNOSIS — E119 Type 2 diabetes mellitus without complications: Secondary | ICD-10-CM | POA: Diagnosis not present

## 2016-03-03 DIAGNOSIS — E7800 Pure hypercholesterolemia, unspecified: Secondary | ICD-10-CM | POA: Diagnosis not present

## 2016-03-03 DIAGNOSIS — I4891 Unspecified atrial fibrillation: Secondary | ICD-10-CM | POA: Diagnosis not present

## 2016-03-03 DIAGNOSIS — M810 Age-related osteoporosis without current pathological fracture: Secondary | ICD-10-CM | POA: Diagnosis not present

## 2016-03-03 DIAGNOSIS — I10 Essential (primary) hypertension: Secondary | ICD-10-CM | POA: Diagnosis not present

## 2016-03-08 DIAGNOSIS — M545 Low back pain: Secondary | ICD-10-CM | POA: Diagnosis not present

## 2016-03-08 DIAGNOSIS — M25562 Pain in left knee: Secondary | ICD-10-CM | POA: Diagnosis not present

## 2016-03-08 DIAGNOSIS — M25561 Pain in right knee: Secondary | ICD-10-CM | POA: Diagnosis not present

## 2016-03-08 DIAGNOSIS — G8929 Other chronic pain: Secondary | ICD-10-CM | POA: Diagnosis not present

## 2016-03-08 DIAGNOSIS — Z23 Encounter for immunization: Secondary | ICD-10-CM | POA: Diagnosis not present

## 2016-03-10 ENCOUNTER — Ambulatory Visit: Payer: Medicare Other | Admitting: Podiatry

## 2016-04-09 DIAGNOSIS — I10 Essential (primary) hypertension: Secondary | ICD-10-CM | POA: Diagnosis not present

## 2016-04-09 DIAGNOSIS — I480 Paroxysmal atrial fibrillation: Secondary | ICD-10-CM | POA: Diagnosis not present

## 2016-04-09 DIAGNOSIS — I2510 Atherosclerotic heart disease of native coronary artery without angina pectoris: Secondary | ICD-10-CM | POA: Diagnosis not present

## 2016-04-14 ENCOUNTER — Ambulatory Visit: Payer: Medicare Other | Admitting: Podiatry

## 2016-05-14 DIAGNOSIS — H353130 Nonexudative age-related macular degeneration, bilateral, stage unspecified: Secondary | ICD-10-CM | POA: Diagnosis not present

## 2016-05-14 DIAGNOSIS — H40001 Preglaucoma, unspecified, right eye: Secondary | ICD-10-CM | POA: Diagnosis not present

## 2016-05-14 DIAGNOSIS — H40052 Ocular hypertension, left eye: Secondary | ICD-10-CM | POA: Diagnosis not present

## 2016-05-18 DIAGNOSIS — I10 Essential (primary) hypertension: Secondary | ICD-10-CM | POA: Diagnosis not present

## 2016-05-18 DIAGNOSIS — M546 Pain in thoracic spine: Secondary | ICD-10-CM | POA: Diagnosis not present

## 2016-05-18 DIAGNOSIS — M47816 Spondylosis without myelopathy or radiculopathy, lumbar region: Secondary | ICD-10-CM | POA: Diagnosis not present

## 2016-05-18 DIAGNOSIS — M47814 Spondylosis without myelopathy or radiculopathy, thoracic region: Secondary | ICD-10-CM | POA: Diagnosis not present

## 2016-05-18 DIAGNOSIS — M549 Dorsalgia, unspecified: Secondary | ICD-10-CM | POA: Diagnosis not present

## 2016-05-18 DIAGNOSIS — M5136 Other intervertebral disc degeneration, lumbar region: Secondary | ICD-10-CM | POA: Diagnosis not present

## 2016-05-18 DIAGNOSIS — Z6838 Body mass index (BMI) 38.0-38.9, adult: Secondary | ICD-10-CM | POA: Diagnosis not present

## 2016-05-18 DIAGNOSIS — M5134 Other intervertebral disc degeneration, thoracic region: Secondary | ICD-10-CM | POA: Diagnosis not present

## 2016-05-31 DIAGNOSIS — M1711 Unilateral primary osteoarthritis, right knee: Secondary | ICD-10-CM | POA: Diagnosis not present

## 2016-05-31 DIAGNOSIS — M1712 Unilateral primary osteoarthritis, left knee: Secondary | ICD-10-CM | POA: Diagnosis not present

## 2016-05-31 DIAGNOSIS — M25561 Pain in right knee: Secondary | ICD-10-CM | POA: Diagnosis not present

## 2016-05-31 DIAGNOSIS — M170 Bilateral primary osteoarthritis of knee: Secondary | ICD-10-CM | POA: Diagnosis not present

## 2016-06-09 DIAGNOSIS — M170 Bilateral primary osteoarthritis of knee: Secondary | ICD-10-CM | POA: Diagnosis not present

## 2016-06-09 DIAGNOSIS — M25561 Pain in right knee: Secondary | ICD-10-CM | POA: Diagnosis not present

## 2016-09-01 DIAGNOSIS — E119 Type 2 diabetes mellitus without complications: Secondary | ICD-10-CM | POA: Diagnosis not present

## 2016-09-01 DIAGNOSIS — I10 Essential (primary) hypertension: Secondary | ICD-10-CM | POA: Diagnosis not present

## 2016-09-01 DIAGNOSIS — E559 Vitamin D deficiency, unspecified: Secondary | ICD-10-CM | POA: Diagnosis not present

## 2016-09-01 DIAGNOSIS — M8580 Other specified disorders of bone density and structure, unspecified site: Secondary | ICD-10-CM | POA: Diagnosis not present

## 2016-09-07 DIAGNOSIS — E559 Vitamin D deficiency, unspecified: Secondary | ICD-10-CM | POA: Diagnosis not present

## 2016-09-07 DIAGNOSIS — E7800 Pure hypercholesterolemia, unspecified: Secondary | ICD-10-CM | POA: Diagnosis not present

## 2016-09-07 DIAGNOSIS — I10 Essential (primary) hypertension: Secondary | ICD-10-CM | POA: Diagnosis not present

## 2016-09-07 DIAGNOSIS — E119 Type 2 diabetes mellitus without complications: Secondary | ICD-10-CM | POA: Diagnosis not present

## 2016-10-08 DIAGNOSIS — I10 Essential (primary) hypertension: Secondary | ICD-10-CM | POA: Diagnosis not present

## 2016-10-08 DIAGNOSIS — I480 Paroxysmal atrial fibrillation: Secondary | ICD-10-CM | POA: Diagnosis not present

## 2016-10-08 DIAGNOSIS — I2510 Atherosclerotic heart disease of native coronary artery without angina pectoris: Secondary | ICD-10-CM | POA: Diagnosis not present

## 2016-10-25 DIAGNOSIS — G8929 Other chronic pain: Secondary | ICD-10-CM | POA: Diagnosis not present

## 2016-10-25 DIAGNOSIS — M25561 Pain in right knee: Secondary | ICD-10-CM | POA: Diagnosis not present

## 2016-10-25 DIAGNOSIS — I10 Essential (primary) hypertension: Secondary | ICD-10-CM | POA: Diagnosis not present

## 2016-11-16 DIAGNOSIS — H401121 Primary open-angle glaucoma, left eye, mild stage: Secondary | ICD-10-CM | POA: Diagnosis not present

## 2016-11-16 DIAGNOSIS — H353131 Nonexudative age-related macular degeneration, bilateral, early dry stage: Secondary | ICD-10-CM | POA: Diagnosis not present

## 2016-11-16 DIAGNOSIS — H40001 Preglaucoma, unspecified, right eye: Secondary | ICD-10-CM | POA: Diagnosis not present

## 2016-12-15 DIAGNOSIS — H401121 Primary open-angle glaucoma, left eye, mild stage: Secondary | ICD-10-CM | POA: Diagnosis not present

## 2016-12-15 DIAGNOSIS — H353131 Nonexudative age-related macular degeneration, bilateral, early dry stage: Secondary | ICD-10-CM | POA: Diagnosis not present

## 2016-12-22 DIAGNOSIS — Z23 Encounter for immunization: Secondary | ICD-10-CM | POA: Diagnosis not present

## 2016-12-29 DIAGNOSIS — H401121 Primary open-angle glaucoma, left eye, mild stage: Secondary | ICD-10-CM | POA: Diagnosis not present

## 2017-02-09 DIAGNOSIS — H401121 Primary open-angle glaucoma, left eye, mild stage: Secondary | ICD-10-CM | POA: Diagnosis not present

## 2017-03-01 DIAGNOSIS — R609 Edema, unspecified: Secondary | ICD-10-CM | POA: Diagnosis not present

## 2017-03-01 DIAGNOSIS — I10 Essential (primary) hypertension: Secondary | ICD-10-CM | POA: Diagnosis not present

## 2017-03-10 DIAGNOSIS — I10 Essential (primary) hypertension: Secondary | ICD-10-CM | POA: Diagnosis not present

## 2017-03-10 DIAGNOSIS — E118 Type 2 diabetes mellitus with unspecified complications: Secondary | ICD-10-CM | POA: Diagnosis not present

## 2017-03-10 DIAGNOSIS — E119 Type 2 diabetes mellitus without complications: Secondary | ICD-10-CM | POA: Diagnosis not present

## 2017-03-10 DIAGNOSIS — E559 Vitamin D deficiency, unspecified: Secondary | ICD-10-CM | POA: Diagnosis not present

## 2017-03-16 DIAGNOSIS — I10 Essential (primary) hypertension: Secondary | ICD-10-CM | POA: Diagnosis not present

## 2017-03-16 DIAGNOSIS — E119 Type 2 diabetes mellitus without complications: Secondary | ICD-10-CM | POA: Diagnosis not present

## 2017-03-16 DIAGNOSIS — E7800 Pure hypercholesterolemia, unspecified: Secondary | ICD-10-CM | POA: Diagnosis not present

## 2017-03-16 DIAGNOSIS — I4891 Unspecified atrial fibrillation: Secondary | ICD-10-CM | POA: Diagnosis not present

## 2017-03-16 DIAGNOSIS — E871 Hypo-osmolality and hyponatremia: Secondary | ICD-10-CM | POA: Diagnosis not present

## 2017-04-05 DIAGNOSIS — E871 Hypo-osmolality and hyponatremia: Secondary | ICD-10-CM | POA: Diagnosis not present

## 2017-04-05 DIAGNOSIS — I10 Essential (primary) hypertension: Secondary | ICD-10-CM | POA: Diagnosis not present

## 2017-04-05 DIAGNOSIS — E43 Unspecified severe protein-calorie malnutrition: Secondary | ICD-10-CM | POA: Diagnosis not present

## 2017-04-05 DIAGNOSIS — E119 Type 2 diabetes mellitus without complications: Secondary | ICD-10-CM | POA: Diagnosis not present

## 2017-04-07 DIAGNOSIS — H401121 Primary open-angle glaucoma, left eye, mild stage: Secondary | ICD-10-CM | POA: Diagnosis not present

## 2017-04-07 DIAGNOSIS — H40001 Preglaucoma, unspecified, right eye: Secondary | ICD-10-CM | POA: Diagnosis not present

## 2017-07-12 DIAGNOSIS — E7800 Pure hypercholesterolemia, unspecified: Secondary | ICD-10-CM | POA: Diagnosis not present

## 2017-07-12 DIAGNOSIS — I10 Essential (primary) hypertension: Secondary | ICD-10-CM | POA: Diagnosis not present

## 2017-07-12 DIAGNOSIS — R21 Rash and other nonspecific skin eruption: Secondary | ICD-10-CM | POA: Diagnosis not present

## 2017-07-12 DIAGNOSIS — E119 Type 2 diabetes mellitus without complications: Secondary | ICD-10-CM | POA: Diagnosis not present

## 2017-07-28 DIAGNOSIS — H5712 Ocular pain, left eye: Secondary | ICD-10-CM | POA: Diagnosis not present

## 2017-08-30 DIAGNOSIS — R21 Rash and other nonspecific skin eruption: Secondary | ICD-10-CM | POA: Diagnosis not present

## 2017-08-30 DIAGNOSIS — I10 Essential (primary) hypertension: Secondary | ICD-10-CM | POA: Diagnosis not present

## 2017-08-30 DIAGNOSIS — M25562 Pain in left knee: Secondary | ICD-10-CM | POA: Diagnosis not present

## 2017-08-30 DIAGNOSIS — M545 Low back pain: Secondary | ICD-10-CM | POA: Diagnosis not present

## 2017-09-19 DIAGNOSIS — Z111 Encounter for screening for respiratory tuberculosis: Secondary | ICD-10-CM | POA: Diagnosis not present

## 2017-10-05 DIAGNOSIS — I10 Essential (primary) hypertension: Secondary | ICD-10-CM | POA: Diagnosis not present

## 2017-10-05 DIAGNOSIS — E119 Type 2 diabetes mellitus without complications: Secondary | ICD-10-CM | POA: Diagnosis not present

## 2017-10-07 DIAGNOSIS — I10 Essential (primary) hypertension: Secondary | ICD-10-CM | POA: Diagnosis not present

## 2017-10-07 DIAGNOSIS — I2510 Atherosclerotic heart disease of native coronary artery without angina pectoris: Secondary | ICD-10-CM | POA: Diagnosis not present

## 2017-10-07 DIAGNOSIS — I480 Paroxysmal atrial fibrillation: Secondary | ICD-10-CM | POA: Diagnosis not present

## 2017-10-10 DIAGNOSIS — I10 Essential (primary) hypertension: Secondary | ICD-10-CM | POA: Diagnosis not present

## 2017-10-10 DIAGNOSIS — Z0000 Encounter for general adult medical examination without abnormal findings: Secondary | ICD-10-CM | POA: Diagnosis not present

## 2017-10-10 DIAGNOSIS — E7800 Pure hypercholesterolemia, unspecified: Secondary | ICD-10-CM | POA: Diagnosis not present

## 2017-10-10 DIAGNOSIS — D649 Anemia, unspecified: Secondary | ICD-10-CM | POA: Diagnosis not present

## 2017-10-10 DIAGNOSIS — I4891 Unspecified atrial fibrillation: Secondary | ICD-10-CM | POA: Diagnosis not present

## 2017-10-10 DIAGNOSIS — L299 Pruritus, unspecified: Secondary | ICD-10-CM | POA: Diagnosis not present

## 2017-10-17 DIAGNOSIS — H353131 Nonexudative age-related macular degeneration, bilateral, early dry stage: Secondary | ICD-10-CM | POA: Diagnosis not present

## 2017-10-17 DIAGNOSIS — H401121 Primary open-angle glaucoma, left eye, mild stage: Secondary | ICD-10-CM | POA: Diagnosis not present

## 2017-10-18 DIAGNOSIS — D485 Neoplasm of uncertain behavior of skin: Secondary | ICD-10-CM | POA: Diagnosis not present

## 2017-10-18 DIAGNOSIS — L259 Unspecified contact dermatitis, unspecified cause: Secondary | ICD-10-CM | POA: Diagnosis not present

## 2017-10-27 ENCOUNTER — Encounter (HOSPITAL_COMMUNITY): Payer: Self-pay | Admitting: Emergency Medicine

## 2017-10-27 ENCOUNTER — Emergency Department (HOSPITAL_COMMUNITY)
Admission: EM | Admit: 2017-10-27 | Discharge: 2017-10-27 | Disposition: A | Discharge status: Home / Self Care | Payer: Medicare Other | Attending: Emergency Medicine | Admitting: Emergency Medicine

## 2017-10-27 ENCOUNTER — Emergency Department (HOSPITAL_COMMUNITY): Payer: Medicare Other

## 2017-10-27 DIAGNOSIS — R109 Unspecified abdominal pain: Secondary | ICD-10-CM | POA: Diagnosis not present

## 2017-10-27 DIAGNOSIS — Z7901 Long term (current) use of anticoagulants: Secondary | ICD-10-CM | POA: Insufficient documentation

## 2017-10-27 DIAGNOSIS — E119 Type 2 diabetes mellitus without complications: Secondary | ICD-10-CM | POA: Insufficient documentation

## 2017-10-27 DIAGNOSIS — Z79899 Other long term (current) drug therapy: Secondary | ICD-10-CM | POA: Diagnosis not present

## 2017-10-27 DIAGNOSIS — R55 Syncope and collapse: Secondary | ICD-10-CM | POA: Diagnosis not present

## 2017-10-27 DIAGNOSIS — I10 Essential (primary) hypertension: Secondary | ICD-10-CM | POA: Insufficient documentation

## 2017-10-27 DIAGNOSIS — R197 Diarrhea, unspecified: Secondary | ICD-10-CM | POA: Diagnosis not present

## 2017-10-27 DIAGNOSIS — K5732 Diverticulitis of large intestine without perforation or abscess without bleeding: Secondary | ICD-10-CM | POA: Diagnosis not present

## 2017-10-27 DIAGNOSIS — R000 Tachycardia, unspecified: Secondary | ICD-10-CM | POA: Diagnosis not present

## 2017-10-27 DIAGNOSIS — I959 Hypotension, unspecified: Secondary | ICD-10-CM | POA: Diagnosis not present

## 2017-10-27 DIAGNOSIS — R1030 Lower abdominal pain, unspecified: Secondary | ICD-10-CM | POA: Diagnosis not present

## 2017-10-27 DIAGNOSIS — R112 Nausea with vomiting, unspecified: Secondary | ICD-10-CM | POA: Diagnosis not present

## 2017-10-27 DIAGNOSIS — K5792 Diverticulitis of intestine, part unspecified, without perforation or abscess without bleeding: Secondary | ICD-10-CM

## 2017-10-27 LAB — I-STAT TROPONIN, ED
Troponin i, poc: 0.01 ng/mL (ref 0.00–0.08)
Troponin i, poc: 0.01 ng/mL (ref 0.00–0.08)

## 2017-10-27 LAB — I-STAT CHEM 8, ED
BUN: 19 mg/dL (ref 8–23)
CHLORIDE: 98 mmol/L (ref 98–111)
Calcium, Ion: 1.12 mmol/L — ABNORMAL LOW (ref 1.15–1.40)
Creatinine, Ser: 1.1 mg/dL — ABNORMAL HIGH (ref 0.44–1.00)
Glucose, Bld: 110 mg/dL — ABNORMAL HIGH (ref 70–99)
HCT: 26 % — ABNORMAL LOW (ref 36.0–46.0)
Hemoglobin: 8.8 g/dL — ABNORMAL LOW (ref 12.0–15.0)
POTASSIUM: 3.4 mmol/L — AB (ref 3.5–5.1)
Sodium: 137 mmol/L (ref 135–145)
TCO2: 26 mmol/L (ref 22–32)

## 2017-10-27 LAB — COMPREHENSIVE METABOLIC PANEL
ALBUMIN: 2.9 g/dL — AB (ref 3.5–5.0)
ALK PHOS: 89 U/L (ref 38–126)
ALT: 20 U/L (ref 0–44)
AST: 18 U/L (ref 15–41)
Anion gap: 9 (ref 5–15)
BILIRUBIN TOTAL: 1 mg/dL (ref 0.3–1.2)
BUN: 18 mg/dL (ref 8–23)
CO2: 28 mmol/L (ref 22–32)
Calcium: 8.8 mg/dL — ABNORMAL LOW (ref 8.9–10.3)
Chloride: 101 mmol/L (ref 98–111)
Creatinine, Ser: 1.06 mg/dL — ABNORMAL HIGH (ref 0.44–1.00)
GFR calc Af Amer: 56 mL/min — ABNORMAL LOW (ref >60)
GFR calc non Af Amer: 48 mL/min — ABNORMAL LOW (ref >60)
GLUCOSE: 113 mg/dL — AB (ref 70–99)
POTASSIUM: 3.4 mmol/L — AB (ref 3.5–5.1)
SODIUM: 138 mmol/L (ref 135–145)
Total Protein: 5.8 g/dL — ABNORMAL LOW (ref 6.5–8.1)

## 2017-10-27 LAB — CBC WITH DIFFERENTIAL/PLATELET
Abs Immature Granulocytes: 0 10*3/uL (ref 0.0–0.1)
BASOS ABS: 0 10*3/uL (ref 0.0–0.1)
Basophils Relative: 1 %
EOS PCT: 2 %
Eosinophils Absolute: 0.1 10*3/uL (ref 0.0–0.7)
HCT: 29.1 % — ABNORMAL LOW (ref 36.0–46.0)
HEMOGLOBIN: 8.4 g/dL — AB (ref 12.0–15.0)
IMMATURE GRANULOCYTES: 0 %
Lymphocytes Relative: 24 %
Lymphs Abs: 1.6 10*3/uL (ref 0.7–4.0)
MCH: 23.6 pg — ABNORMAL LOW (ref 26.0–34.0)
MCHC: 28.9 g/dL — AB (ref 30.0–36.0)
MCV: 81.7 fL (ref 78.0–100.0)
Monocytes Absolute: 0.9 10*3/uL (ref 0.1–1.0)
Monocytes Relative: 13 %
NEUTROS PCT: 60 %
Neutro Abs: 4.1 10*3/uL (ref 1.7–7.7)
Platelets: 321 10*3/uL (ref 150–400)
RBC: 3.56 MIL/uL — AB (ref 3.87–5.11)
RDW: 16.9 % — AB (ref 11.5–15.5)
WBC: 6.7 10*3/uL (ref 4.0–10.5)

## 2017-10-27 LAB — LIPASE, BLOOD: Lipase: 71 U/L — ABNORMAL HIGH (ref 11–51)

## 2017-10-27 MED ORDER — IOHEXOL 300 MG/ML  SOLN
100.0000 mL | Freq: Once | INTRAMUSCULAR | Status: AC | PRN
  Administered 2017-10-27: 100 mL via INTRAVENOUS

## 2017-10-27 MED ORDER — AMOXICILLIN-POT CLAVULANATE 875-125 MG PO TABS: 1.0000 | ORAL_TABLET | Freq: Two times a day (BID) | ORAL | 0 refills | Status: DC

## 2017-10-27 MED ORDER — ALUM & MAG HYDROXIDE-SIMETH 200-200-20 MG/5ML PO SUSP
15.0000 mL | Freq: Once | ORAL | Status: AC
  Administered 2017-10-27: 15 mL via ORAL
  Filled 2017-10-27: qty 30

## 2017-10-27 MED ORDER — SODIUM CHLORIDE 0.9 % IV BOLUS
1000.0000 mL | Freq: Once | INTRAVENOUS | Status: AC
  Administered 2017-10-27: 1000 mL via INTRAVENOUS

## 2017-10-27 NOTE — ED Provider Notes (Signed)
[facility] EMERGENCY DEPARTMENT Provider Note   CSN: [id number] Arrival date & time: 10/27/17  2703     History   Chief Complaint Chief Complaint  Patient presents with  . Hypotension    HPI [name] is a 84 y.o. female.  84 yo  F with a chief complaint of abdominal pain and a near syncopal event.  Patient states this started while she was on the toilet attempting to have a bowel movement.  Felt lightheaded and nauseated.  She thought maybe this was her heart and she had had a heart attack where she also had a con commitment gallbladder attack.  She took 2 nitroglycerin and then felt like she was going to pass out.  Had a blood pressure measured in the 60s and improved with 500 cc IV fluids.  She denies fevers or chills.  Feels that her abdominal pain is much better at this point.  Denies any urinary symptoms.  Denies fevers.  The history is provided by the patient.  Illness  This is a new problem. The current episode started 6 to 12 hours ago. The problem occurs rarely. The problem has been resolved. Associated symptoms include abdominal pain. Pertinent negatives include no chest pain, no headaches and no shortness of breath. Nothing aggravates the symptoms. Nothing relieves the symptoms. She has tried nothing for the symptoms. The treatment provided no relief.    Past Medical History:  Diagnosis Date  . Acalculous cholecystitis   . Arthritis   . Blood transfusion   . Blood transfusion without reported diagnosis   . Cataract    bilateral-removed from both eyes  . Coronary artery disease    mi  7/12,  stress test 12/21/2011=>negative for ischemia [name])  . Diabetes mellitus    metformin taken off-dr never dx'd her for DM-no meds, no sugar checks -no per dr Maudie Mercury  . Dizzy   . Dyslipidemia   . Fatigue   . GERD (gastroesophageal reflux disease)   . Glaucoma   . Hypertension   . Hypokalemia   . Mitral regurgitation    mild to moderate, cardiac  echo 12/02/2011, EF 51% [name])  . Myocardial infarction ([place])    7/12  . PONV (postoperative nausea and vomiting)    yrs ago  . Shortness of breath     Patient Active Problem List   Diagnosis Date Noted  . Atrial fibrillation ([place]) 01/21/2015  . Diarrhea 04/24/2014  . S/P laparoscopic cholecystectomy 02/26/2011  . Hypertension 02/05/2011  . Myocardial infarction ([place]) 02/05/2011  . Hyperlipidemia 02/05/2011  . Diabetes mellitus type II 02/05/2011  . GERD (gastroesophageal reflux disease) 02/05/2011  . Anxiety 02/05/2011  . Depression 02/05/2011  . Hepatic steatosis 02/05/2011  . Pulmonary Hypertension, secondary 02/05/2011    Past Surgical History:  Procedure Laterality Date  . ABDOMINAL HYSTERECTOMY    . BILIARY DRAINAGE  9/12  . CARDIOVERSION N/A 02/12/2015   Procedure: CARDIOVERSION;  Surgeon: [name], MD;  Location: [facility];  Service: Cardiovascular;  Laterality: N/A;  . CATARACT EXTRACTION     2  . CHOLECYSTECTOMY  02/25/2011   Procedure: LAPAROSCOPIC CHOLECYSTECTOMY WITH INTRAOPERATIVE CHOLANGIOGRAM;  Surgeon: Gayland Curry, MD;  Location: [facility];  Service: General;  Laterality: N/A;  . CORONARY ANGIOPLASTY WITH STENT PLACEMENT  09/24/10   drug eluting stents  . PARATHYROIDECTOMY     tumor excision  . SHOULDER SURGERY     right  . SIGMOIDOSCOPY     flex sig  in the 80's per pt.     OB History   None      Home Medications    Prior to Admission medications   Medication Sig Start Date End Date Taking? Authorizing Provider  clobetasol cream (TEMOVATE) 6.07 % Apply 1 application topically 2 (two) times daily.   Yes [provider]  clotrimazole-betamethasone (LOTRISONE) cream Apply 1 application topically 2 (two) times daily. Affected area 08/30/17  Yes [provider]  diltiazem (CARDIZEM CD) 120 MG 24 hr capsule Take 1 capsule (120 mg total) by mouth daily. 01/25/15  Yes [name], MD  dorzolamide (TRUSOPT) 2 % ophthalmic solution  Place 1 drop into the left eye 2 (two) times daily.   Yes [provider]  ergocalciferol (VITAMIN D2) 50000 units capsule Take 50,000 Units by mouth once a week.   Yes [provider]  furosemide (LASIX) 40 MG tablet Take 40 mg by mouth as needed for edema.   Yes [provider]  irbesartan (AVAPRO) 150 MG tablet Take 150 mg by mouth daily. 09/03/17  Yes [provider]  latanoprost (XALATAN) 0.005 % ophthalmic solution Place 1 drop into the left eye at bedtime. 07/28/17  Yes [provider]  methocarbamol (ROBAXIN) 500 MG tablet Take 500 mg by mouth 4 (four) times daily as needed (pain).    Yes [provider]  metoprolol tartrate (LOPRESSOR) 25 MG tablet Take 25 mg by mouth 2 (two) times daily.   Yes [provider]  Multiple Vitamins-Minerals (PRESERVISION AREDS 2) CAPS Take 1 capsule by mouth 2 (two) times daily.   Yes [provider]  nitroGLYCERIN (NITROSTAT) 0.4 MG SL tablet Place 0.4 mg under the tongue every 5 (five) minutes as needed for chest pain.   Yes [provider]  Propylene Glycol (SYSTANE BALANCE OP) Apply 1 drop to eye daily as needed (dry eyes).   Yes [provider]  Rivaroxaban (XARELTO) 15 MG TABS tablet Take 1 tablet (15 mg total) by mouth daily with supper. 01/25/15  Yes [name], MD  rosuvastatin (CRESTOR) 5 MG tablet Take 5 mg by mouth at bedtime.    Yes [provider]  amiodarone (PACERONE) 400 MG tablet Take 1 tablet (400 mg total) by mouth 2 (two) times daily. Patient not taking: Reported on 10/27/2017 01/25/15   [name], MD  amoxicillin-clavulanate (AUGMENTIN) 875-125 MG tablet Take 1 tablet by mouth 2 (two) times daily. 10/27/17   Deno Etienne, DO    Family History Family History  Problem Relation Age of Onset  . Heart disease Father   . Heart disease Brother   . Melanoma Son   . Cancer Maternal Grandfather   . Colon cancer Neg Hx   . Rectal cancer Neg Hx   .  Stomach cancer Neg Hx     Social History Social History   Tobacco Use  . Smoking status: Never Smoker  . Smokeless tobacco: Never Used  Substance Use Topics  . Alcohol use: Yes    Alcohol/week: 0.0 oz    Comment: socially-few drinks on the weekends  . Drug use: No     Allergies   Dilaudid [hydromorphone hcl]; Aspirin; and Sulfur   Review of Systems Review of Systems  Constitutional: Negative for chills and fever.  HENT: Negative for congestion and rhinorrhea.   Eyes: Negative for redness and visual disturbance.  Respiratory: Negative for shortness of breath and wheezing.   Cardiovascular: Negative for chest pain and palpitations.  Gastrointestinal: Positive for  abdominal pain, nausea and vomiting.  Genitourinary: Negative for dysuria and urgency.  Musculoskeletal: Negative for arthralgias and myalgias.  Skin: Negative for pallor and wound.  Neurological: Positive for syncope (near). Negative for dizziness and headaches.     Physical Exam Updated Vital Signs BP 128/68   Pulse 98   Temp (!) 97.3 F (36.3 C) (Temporal)   Resp 18   SpO2 100%   Physical Exam  Constitutional: She is oriented to person, place, and time. She appears well-developed and well-nourished. No distress.  HENT:  Head: Normocephalic and atraumatic.  Eyes: Pupils are equal, round, and reactive to light. EOM are normal.  Neck: Normal range of motion. Neck supple.  Cardiovascular: Normal rate and regular rhythm. Exam reveals no gallop and no friction rub.  No murmur heard. Pulmonary/Chest: Effort normal. She has no wheezes. She has no rales.  Abdominal: Soft. She exhibits no distension and no mass. There is tenderness (diffuse, worst to the epigastrium). There is no guarding.  Musculoskeletal: She exhibits no edema or tenderness.  Neurological: She is alert and oriented to person, place, and time.  Skin: Skin is warm and dry. She is not diaphoretic.  Psychiatric: She has a normal mood and  affect. Her behavior is normal.  Nursing note and vitals reviewed.    ED Treatments / Results  Labs (all labs ordered are listed, but only abnormal results are displayed) Labs Reviewed  CBC WITH DIFFERENTIAL/PLATELET - Abnormal; Notable for the following components:      Result Value   RBC 3.56 (*)    Hemoglobin 8.4 (*)    HCT 29.1 (*)    MCH 23.6 (*)    MCHC 28.9 (*)    RDW 16.9 (*)    All other components within normal limits  COMPREHENSIVE METABOLIC PANEL - Abnormal; Notable for the following components:   Potassium 3.4 (*)    Glucose, Bld 113 (*)    Creatinine, Ser 1.06 (*)    Calcium 8.8 (*)    Total Protein 5.8 (*)    Albumin 2.9 (*)    GFR calc non Af Amer 48 (*)    GFR calc Af Amer 56 (*)    All other components within normal limits  LIPASE, BLOOD - Abnormal; Notable for the following components:   Lipase 71 (*)    All other components within normal limits  I-STAT CHEM 8, ED - Abnormal; Notable for the following components:   Potassium 3.4 (*)    Creatinine, Ser 1.10 (*)    Glucose, Bld 110 (*)    Calcium, Ion 1.12 (*)    Hemoglobin 8.8 (*)    HCT 26.0 (*)    All other components within normal limits  I-STAT TROPONIN, ED  I-STAT TROPONIN, ED    EKG EKG Interpretation  Date/Time:  Thursday October 27 2017 10:14:14 EDT Ventricular Rate:  106 PR Interval:    QRS Duration: 106 QT Interval:  365 QTC Calculation: 485 R Axis:   39 Text Interpretation:  Atrial flutter with predominant 3:1 AV block Low voltage, precordial leads RSR' in V1 or V2, probably normal variant Borderline T abnormalities, anterior leads No significant change since last tracing Confirmed by Deno Etienne [phone]) on 10/27/2017 10:24:29 AM Also confirmed by Deno Etienne [phone]), editor Shon Hale [phone])  on 10/27/2017 1:36:06 PM   Radiology Ct Abdomen Pelvis W Contrast  Result Date: 10/27/2017 CLINICAL DATA:  Abdominal pain, diarrhea and weakness. EXAM: CT ABDOMEN AND PELVIS WITH CONTRAST  TECHNIQUE: Multidetector CT  imaging of the abdomen and pelvis was performed using the standard protocol following bolus administration of intravenous contrast. CONTRAST:  139mL OMNIPAQUE IOHEXOL 300 MG/ML  SOLN COMPARISON:  12/04/2010 FINDINGS: Lower chest: The heart size is enlarged. Calcifications within the thoracic aorta, lad coronary artery noted. No acute findings identified. Hepatobiliary: Stable partially exophytic lesion within segment 2 of the liver measures 2.2 cm, image 21/3. Also unchanged is a focal area of low attenuation within the posterior aspect of segment 8 measuring 1 cm, image 21/3. There is a low-density structure within segment 5 adjacent to the gallbladder fossa measuring 1.6 cm. Not seen on previous exam. This is indeterminate. Previous cholecystectomy. No significant biliary dilatation. Pancreas: Unremarkable. No pancreatic ductal dilatation or surrounding inflammatory changes. Spleen: Normal in size without focal abnormality. Adrenals/Urinary Tract: Normal adrenal glands. Bilateral renal cortical scarring and lobulation is identified and appears similar to the previous exam. No kidney mass or hydronephrosis identified. Stomach/Bowel: The stomach appears normal. The small bowel loops are unremarkable. The appendix is visualized and appears normal. There is extensive sigmoid diverticulosis identified. Mild wall thickening involving the sigmoid colon is identified which may reflect low-grade, uncomplicated sigmoid diverticulitis. No perforation or abscess identified. Vascular/Lymphatic: Aortic atherosclerosis. No aneurysm. No abdominal or pelvic adenopathy. Reproductive: Status post hysterectomy. No adnexal masses. Other: No free fluid or fluid collections identified. Musculoskeletal: Mild lumbar scoliosis and multi level spondylosis. IMPRESSION: 1. There is extensive sigmoid diverticulosis with mild wall thickening suspicious for uncomplicated acute diverticulitis. No perforation or abscess  formation identified. 2. There is a new low-attenuation structure within segment 5 of the liver adjacent to the gallbladder fossa, favor focal fatty deposition. Suggest followup imaging in 3-6 months to ensure stability. At this time the study of choice would be a contrast enhanced liver protocol MRI. The other low-density liver lesions are unchanged from 12/04/2010 and are likely to represent benign abnormalities. 3.  Aortic Atherosclerosis (ICD10-I70.0). 4. Coronary artery atherosclerotic calcifications noted. Electronically Signed   By: Kerby Moors M.D.   On: 10/27/2017 13:01    Procedures Procedures (including critical care time)  Medications Ordered in ED Medications  alum & mag hydroxide-simeth (MAALOX/MYLANTA) 200-200-20 MG/5ML suspension 15 mL (15 mLs Oral Given 10/27/17 1038)  sodium chloride 0.9 % bolus 1,000 mL (0 mLs Intravenous Stopped 10/27/17 1117)  iohexol (OMNIPAQUE) 300 MG/ML solution 100 mL (100 mLs Intravenous Contrast Given 10/27/17 1210)     Initial Impression / Assessment and Plan / ED Course  I have reviewed the triage vital signs and the nursing notes.  Pertinent labs & imaging results that were available during my care of the patient were reviewed by me and considered in my medical decision making (see chart for details).     84 yo F with a chief complaint of abdominal pain and a syncopal event.  Patient had an episode of emesis and now feels better.  Sounds vasovagal by history though patient does have diffuse abdominal tenderness.  Will obtain labs CT the abdomen pelvis.  CT scan with possible very early diverticulitis.  Will treat as such.  Patient continues to be asymptomatic while in the ED.  She has a mildly elevated lipase.  Her LFTs are otherwise unremarkable.  Her hemoglobin is 8.4 which is lower than her baseline.  Her last check in our system was 2 years ago.  She states that her doctor was supposed to draw blood today because she has been anemic.  I am not  sure if this is different or  not.  She continues to say she feels much better and like to go home.  I will have her call her primary care physician and set up an appointment.  2:40 PM:  I have discussed the diagnosis/risks/treatment options with the patient and family and believe the pt to be eligible for discharge home to follow-up with PCP. We also discussed returning to the ED immediately if new or worsening sx occur. We discussed the sx which are most concerning (e.g., sudden worsening pain, fever, inability to tolerate by mouth) that necessitate immediate return. Medications administered to the patient during their visit and any new prescriptions provided to the patient are listed below.  Medications given during this visit Medications  alum & mag hydroxide-simeth (MAALOX/MYLANTA) 200-200-20 MG/5ML suspension 15 mL (15 mLs Oral Given 10/27/17 1038)  sodium chloride 0.9 % bolus 1,000 mL (0 mLs Intravenous Stopped 10/27/17 1117)  iohexol (OMNIPAQUE) 300 MG/ML solution 100 mL (100 mLs Intravenous Contrast Given 10/27/17 1210)      The patient appears reasonably screen and/or stabilized for discharge and I doubt any other medical condition or other [facility] requiring further screening, evaluation, or treatment in the ED at this time prior to discharge.    Final Clinical Impressions(s) / ED Diagnoses   Final diagnoses:  Near syncope  Diverticulitis    ED Discharge Orders        Ordered    amoxicillin-clavulanate (AUGMENTIN) 875-125 MG tablet  2 times daily     10/27/17 [place], Lajean Boese, DO 10/27/17 1440

## 2017-10-27 NOTE — ED Notes (Signed)
Pt assisted to bathroom via wheelchair.

## 2017-10-27 NOTE — Discharge Instructions (Signed)
Eat and drink well for the next couple days.  Return to the ED for worsening.  Follow up with your PCP.

## 2017-10-27 NOTE — ED Notes (Signed)
Pt has no complaints at this time  

## 2017-10-27 NOTE — ED Triage Notes (Signed)
Pt here from assisted living with c/o dizziness and nausea pt vomitted once and feels better . Pt was given 2 nitro by assisted living and dropped her pressure but up with 250 of fluid , no complaints at present

## 2017-10-27 NOTE — ED Notes (Signed)
Patient transported to CT 

## 2017-11-01 DIAGNOSIS — D649 Anemia, unspecified: Secondary | ICD-10-CM | POA: Diagnosis not present

## 2017-11-01 DIAGNOSIS — E43 Unspecified severe protein-calorie malnutrition: Secondary | ICD-10-CM | POA: Diagnosis not present

## 2017-11-01 DIAGNOSIS — I10 Essential (primary) hypertension: Secondary | ICD-10-CM | POA: Diagnosis not present

## 2017-11-01 DIAGNOSIS — D509 Iron deficiency anemia, unspecified: Secondary | ICD-10-CM | POA: Diagnosis not present

## 2017-11-01 DIAGNOSIS — E119 Type 2 diabetes mellitus without complications: Secondary | ICD-10-CM | POA: Diagnosis not present

## 2017-11-01 DIAGNOSIS — K5792 Diverticulitis of intestine, part unspecified, without perforation or abscess without bleeding: Secondary | ICD-10-CM | POA: Diagnosis not present

## 2017-11-01 DIAGNOSIS — L239 Allergic contact dermatitis, unspecified cause: Secondary | ICD-10-CM | POA: Diagnosis not present

## 2017-11-02 ENCOUNTER — Emergency Department (HOSPITAL_COMMUNITY): Payer: Medicare Other

## 2017-11-02 ENCOUNTER — Encounter (HOSPITAL_COMMUNITY): Payer: Self-pay | Admitting: Emergency Medicine

## 2017-11-02 ENCOUNTER — Other Ambulatory Visit: Payer: Self-pay

## 2017-11-02 ENCOUNTER — Inpatient Hospital Stay (HOSPITAL_COMMUNITY)
Admission: EM | Admit: 2017-11-02 | Discharge: 2017-11-11 | DRG: 377 | Disposition: A | Discharge status: Home Health | Payer: Medicare Other | Attending: Family Medicine | Admitting: Family Medicine

## 2017-11-02 DIAGNOSIS — I130 Hypertensive heart and chronic kidney disease with heart failure and stage 1 through stage 4 chronic kidney disease, or unspecified chronic kidney disease: Secondary | ICD-10-CM | POA: Diagnosis present

## 2017-11-02 DIAGNOSIS — H409 Unspecified glaucoma: Secondary | ICD-10-CM | POA: Diagnosis present

## 2017-11-02 DIAGNOSIS — E1122 Type 2 diabetes mellitus with diabetic chronic kidney disease: Secondary | ICD-10-CM | POA: Diagnosis present

## 2017-11-02 DIAGNOSIS — Z79899 Other long term (current) drug therapy: Secondary | ICD-10-CM | POA: Diagnosis not present

## 2017-11-02 DIAGNOSIS — K5732 Diverticulitis of large intestine without perforation or abscess without bleeding: Secondary | ICD-10-CM | POA: Diagnosis present

## 2017-11-02 DIAGNOSIS — N179 Acute kidney failure, unspecified: Secondary | ICD-10-CM | POA: Diagnosis present

## 2017-11-02 DIAGNOSIS — I481 Persistent atrial fibrillation: Secondary | ICD-10-CM | POA: Diagnosis present

## 2017-11-02 DIAGNOSIS — D500 Iron deficiency anemia secondary to blood loss (chronic): Secondary | ICD-10-CM | POA: Diagnosis present

## 2017-11-02 DIAGNOSIS — Z955 Presence of coronary angioplasty implant and graft: Secondary | ICD-10-CM | POA: Diagnosis not present

## 2017-11-02 DIAGNOSIS — Z6836 Body mass index (BMI) 36.0-36.9, adult: Secondary | ICD-10-CM

## 2017-11-02 DIAGNOSIS — N183 Chronic kidney disease, stage 3 (moderate): Secondary | ICD-10-CM | POA: Diagnosis present

## 2017-11-02 DIAGNOSIS — I2510 Atherosclerotic heart disease of native coronary artery without angina pectoris: Secondary | ICD-10-CM | POA: Diagnosis present

## 2017-11-02 DIAGNOSIS — K219 Gastro-esophageal reflux disease without esophagitis: Secondary | ICD-10-CM | POA: Diagnosis present

## 2017-11-02 DIAGNOSIS — Z90710 Acquired absence of both cervix and uterus: Secondary | ICD-10-CM

## 2017-11-02 DIAGNOSIS — K319 Disease of stomach and duodenum, unspecified: Secondary | ICD-10-CM | POA: Diagnosis not present

## 2017-11-02 DIAGNOSIS — L539 Erythematous condition, unspecified: Secondary | ICD-10-CM | POA: Diagnosis present

## 2017-11-02 DIAGNOSIS — E785 Hyperlipidemia, unspecified: Secondary | ICD-10-CM | POA: Diagnosis present

## 2017-11-02 DIAGNOSIS — I272 Pulmonary hypertension, unspecified: Secondary | ICD-10-CM | POA: Diagnosis present

## 2017-11-02 DIAGNOSIS — I480 Paroxysmal atrial fibrillation: Secondary | ICD-10-CM | POA: Diagnosis present

## 2017-11-02 DIAGNOSIS — K2961 Other gastritis with bleeding: Secondary | ICD-10-CM | POA: Diagnosis not present

## 2017-11-02 DIAGNOSIS — I4891 Unspecified atrial fibrillation: Secondary | ICD-10-CM | POA: Diagnosis not present

## 2017-11-02 DIAGNOSIS — E1151 Type 2 diabetes mellitus with diabetic peripheral angiopathy without gangrene: Secondary | ICD-10-CM | POA: Diagnosis present

## 2017-11-02 DIAGNOSIS — E7800 Pure hypercholesterolemia, unspecified: Secondary | ICD-10-CM | POA: Diagnosis not present

## 2017-11-02 DIAGNOSIS — K5730 Diverticulosis of large intestine without perforation or abscess without bleeding: Secondary | ICD-10-CM | POA: Diagnosis not present

## 2017-11-02 DIAGNOSIS — R0602 Shortness of breath: Secondary | ICD-10-CM | POA: Diagnosis not present

## 2017-11-02 DIAGNOSIS — Z8249 Family history of ischemic heart disease and other diseases of the circulatory system: Secondary | ICD-10-CM | POA: Diagnosis not present

## 2017-11-02 DIAGNOSIS — Z7901 Long term (current) use of anticoagulants: Secondary | ICD-10-CM | POA: Diagnosis not present

## 2017-11-02 DIAGNOSIS — I5033 Acute on chronic diastolic (congestive) heart failure: Secondary | ICD-10-CM | POA: Diagnosis present

## 2017-11-02 DIAGNOSIS — I252 Old myocardial infarction: Secondary | ICD-10-CM

## 2017-11-02 DIAGNOSIS — I10 Essential (primary) hypertension: Secondary | ICD-10-CM | POA: Diagnosis not present

## 2017-11-02 DIAGNOSIS — E669 Obesity, unspecified: Secondary | ICD-10-CM | POA: Diagnosis present

## 2017-11-02 DIAGNOSIS — D62 Acute posthemorrhagic anemia: Secondary | ICD-10-CM | POA: Diagnosis not present

## 2017-11-02 DIAGNOSIS — K5792 Diverticulitis of intestine, part unspecified, without perforation or abscess without bleeding: Secondary | ICD-10-CM | POA: Diagnosis not present

## 2017-11-02 DIAGNOSIS — I739 Peripheral vascular disease, unspecified: Secondary | ICD-10-CM | POA: Diagnosis not present

## 2017-11-02 DIAGNOSIS — Z9049 Acquired absence of other specified parts of digestive tract: Secondary | ICD-10-CM | POA: Diagnosis not present

## 2017-11-02 DIAGNOSIS — D649 Anemia, unspecified: Secondary | ICD-10-CM | POA: Diagnosis not present

## 2017-11-02 DIAGNOSIS — R42 Dizziness and giddiness: Secondary | ICD-10-CM | POA: Diagnosis not present

## 2017-11-02 DIAGNOSIS — E119 Type 2 diabetes mellitus without complications: Secondary | ICD-10-CM

## 2017-11-02 DIAGNOSIS — H3530 Unspecified macular degeneration: Secondary | ICD-10-CM | POA: Diagnosis not present

## 2017-11-02 DIAGNOSIS — K3189 Other diseases of stomach and duodenum: Secondary | ICD-10-CM | POA: Diagnosis not present

## 2017-11-02 HISTORY — DX: Diverticulitis of intestine, part unspecified, without perforation or abscess without bleeding: K57.92

## 2017-11-02 LAB — COMPREHENSIVE METABOLIC PANEL
ALT: 28 U/L (ref 0–44)
ANION GAP: 10 (ref 5–15)
AST: 26 U/L (ref 15–41)
Albumin: 2.7 g/dL — ABNORMAL LOW (ref 3.5–5.0)
Alkaline Phosphatase: 97 U/L (ref 38–126)
BUN: 18 mg/dL (ref 8–23)
CO2: 24 mmol/L (ref 22–32)
Calcium: 8.3 mg/dL — ABNORMAL LOW (ref 8.9–10.3)
Chloride: 104 mmol/L (ref 98–111)
Creatinine, Ser: 0.98 mg/dL (ref 0.44–1.00)
GFR calc non Af Amer: 53 mL/min — ABNORMAL LOW (ref >60)
Glucose, Bld: 97 mg/dL (ref 70–99)
POTASSIUM: 3.6 mmol/L (ref 3.5–5.1)
Sodium: 138 mmol/L (ref 135–145)
TOTAL PROTEIN: 5.8 g/dL — AB (ref 6.5–8.1)
Total Bilirubin: 1 mg/dL (ref 0.3–1.2)

## 2017-11-02 LAB — CBC
HCT: 27.9 % — ABNORMAL LOW (ref 36.0–46.0)
Hemoglobin: 8.1 g/dL — ABNORMAL LOW (ref 12.0–15.0)
MCH: 23.5 pg — ABNORMAL LOW (ref 26.0–34.0)
MCHC: 29 g/dL — ABNORMAL LOW (ref 30.0–36.0)
MCV: 81.1 fL (ref 78.0–100.0)
Platelets: 254 10*3/uL (ref 150–400)
RBC: 3.44 MIL/uL — AB (ref 3.87–5.11)
RDW: 17.2 % — ABNORMAL HIGH (ref 11.5–15.5)
WBC: 6.1 10*3/uL (ref 4.0–10.5)

## 2017-11-02 LAB — PREPARE RBC (CROSSMATCH)

## 2017-11-02 MED ORDER — ONDANSETRON HCL 4 MG/2ML IJ SOLN
4.0000 mg | Freq: Four times a day (QID) | INTRAMUSCULAR | Status: DC | PRN
  Administered 2017-11-04: 4 mg via INTRAVENOUS
  Filled 2017-11-02: qty 2

## 2017-11-02 MED ORDER — DORZOLAMIDE HCL 2 % OP SOLN
1.0000 [drp] | Freq: Two times a day (BID) | OPHTHALMIC | Status: DC
  Administered 2017-11-03 – 2017-11-11 (×17): 1 [drp] via OPHTHALMIC
  Filled 2017-11-02: qty 10

## 2017-11-02 MED ORDER — SODIUM CHLORIDE 0.9 % IV SOLN
10.0000 mL/h | Freq: Once | INTRAVENOUS | Status: AC
  Administered 2017-11-02: 10 mL/h via INTRAVENOUS

## 2017-11-02 MED ORDER — LATANOPROST 0.005 % OP SOLN
1.0000 [drp] | Freq: Every day | OPHTHALMIC | Status: DC
  Administered 2017-11-03 – 2017-11-10 (×8): 1 [drp] via OPHTHALMIC
  Filled 2017-11-02: qty 2.5

## 2017-11-02 MED ORDER — DILTIAZEM HCL-DEXTROSE 100-5 MG/100ML-% IV SOLN (PREMIX)
5.0000 mg/h | INTRAVENOUS | Status: DC
  Administered 2017-11-03 – 2017-11-05 (×4): 5 mg/h via INTRAVENOUS
  Filled 2017-11-02 (×6): qty 100

## 2017-11-02 MED ORDER — SODIUM CHLORIDE 0.9 % IV BOLUS
500.0000 mL | Freq: Once | INTRAVENOUS | Status: AC
  Administered 2017-11-02: 500 mL via INTRAVENOUS

## 2017-11-02 MED ORDER — ACETAMINOPHEN 650 MG RE SUPP: 650.0000 mg | Freq: Four times a day (QID) | RECTAL | Status: DC | PRN

## 2017-11-02 MED ORDER — INSULIN ASPART 100 UNIT/ML ~~LOC~~ SOLN: 0.0000 [IU] | SUBCUTANEOUS | Status: DC

## 2017-11-02 MED ORDER — ROSUVASTATIN CALCIUM 10 MG PO TABS
5.0000 mg | ORAL_TABLET | Freq: Every day | ORAL | Status: DC
  Administered 2017-11-03 – 2017-11-10 (×9): 5 mg via ORAL
  Filled 2017-11-02 (×9): qty 1

## 2017-11-02 MED ORDER — HYDROCODONE-ACETAMINOPHEN 5-325 MG PO TABS: 1.0000 | ORAL_TABLET | ORAL | Status: DC | PRN

## 2017-11-02 MED ORDER — ACETAMINOPHEN 325 MG PO TABS: 650.0000 mg | ORAL_TABLET | Freq: Four times a day (QID) | ORAL | Status: DC | PRN

## 2017-11-02 MED ORDER — AMOXICILLIN-POT CLAVULANATE 875-125 MG PO TABS
1.0000 | ORAL_TABLET | Freq: Two times a day (BID) | ORAL | Status: AC
  Administered 2017-11-03 (×3): 1 via ORAL
  Filled 2017-11-02 (×3): qty 1

## 2017-11-02 MED ORDER — ONDANSETRON HCL 4 MG PO TABS: 4.0000 mg | ORAL_TABLET | Freq: Four times a day (QID) | ORAL | Status: DC | PRN

## 2017-11-02 MED ORDER — METOPROLOL TARTRATE 25 MG PO TABS
25.0000 mg | ORAL_TABLET | Freq: Two times a day (BID) | ORAL | Status: DC
  Administered 2017-11-03 – 2017-11-05 (×7): 25 mg via ORAL
  Filled 2017-11-02 (×7): qty 1

## 2017-11-02 MED ORDER — DILTIAZEM HCL 30 MG PO TABS
30.0000 mg | ORAL_TABLET | Freq: Once | ORAL | Status: AC
  Administered 2017-11-02: 30 mg via ORAL
  Filled 2017-11-02: qty 1

## 2017-11-02 NOTE — H&P (Addendum)
[name] IPJ:[id number] DOB: September 03, 1933 DOA: 11/02/2017     PCP: [name] Gravel, MD   Outpatient Specialists:   CARDS:  Dr. Einar Gip   Pulmonary  Dr. Carney Corners Dr. Olevia Perches Patient arrived to ER on 11/02/17 at 1553  Patient coming from:  [place]    Chief Complaint:  Chief Complaint  Patient presents with  . Abnormal Lab  . Blood transfusion    HPI: [name] is a 84 y.o. female with medical history significant of A.fib, CAD, HTn, DM 2, HLD, Diastolic CHF, CKD, GERD, mitral regurgitation    Presented with  Exertional dizziness shortness of breath this lasted for the past 3 weeks today was noted to have abnormal lab work showing worsening anemia and she was told to present to emergency department. Last colonoscopy done 3 years ago and per patient had 2 benign polyps, no hx of PUd.  She had required blood transfusion once in 2011 in the setting of MI and gall bladder disease.   Patient was seen in emergency department on August 1 with Right lower quadrant pain and  near syncopal event patient became concerned and it was her heart and took nitroglycerin 2 tablets after that she felt like she was about a pass out her blood pressure dropped to 60s and EMS was called she was given IV fluids hemoglobin at that time was noted to be 8.4 which is lower than baseline, she was diagnosed with diverticulitis based on CT imaging showing mild wall thickening no perforation or abscess formation. Of note there was a new low-attenuation structure within the liver likely focal fatty deposition with recommended follow-up imaging. PAtietn was rehydrated and discharged to home on Augmentin. She followed up with her primary care provider and blood work was rechecked at this point her hemoglobin was noted to be down to 7.5 and she was told to present to emergency department. She's been feeling lightheaded for over a week. But denies any black stools or blood in stool she has  been on Xarelto for history of A.fib.  Regarding pertinent Chronic problems: Echo 2016 EF 60-65% History of CAD last stress test was done in 2013 History of atrial fibrillation on diltiazem metoprolol and Xarelto Rhythm controlled with amiodarone  While in ER: Hemoglobin in ER 8.1 Noted to be in A. Fib with RVR heart rates fluctuating between the 110-140 Discussed with the patient's cardiology Dr. Einar Gip who will see patient in consult in the a.m. But recommends treating underlying symptomatic anemia Was given diltiazem 30 by mouth controlled heart rate  GI has also been consulted    After obtaining anemia panel and CBC with differential patient's being transfused  The following Work up has been ordered so far:  Orders Placed This Encounter  Procedures  . DG Chest 1 View  . Comprehensive metabolic panel  . CBC  . Vitamin B12  . Folate  . Iron and TIBC  . Ferritin  . Reticulocytes  . Differential  . Diet NPO time specified  . Orthostatic Vital signs  . Place X2 Large Bore IV's  . Initiate Carrier Fluid Protocol  . Complete patient signature process for consent form  . Practitioner attestation of consent  . Inpatient consult to Cardiology  . Consult to hospitalist  . Consult to gastroenterology  . Pulse oximetry, continuous  . POC occult blood, ED  . Repeat EKG  . Type and screen [place]  . Prepare RBC  Following Medications were ordered in ER: Medications  sodium chloride 0.9 % bolus 500 mL (has no administration in time range)  0.9 %  sodium chloride infusion (has no administration in time range)  diltiazem (CARDIZEM) tablet 30 mg (has no administration in time range)    Significant initial  Findings: Abnormal Labs Reviewed  COMPREHENSIVE METABOLIC PANEL - Abnormal; Notable for the following components:      Result Value   Calcium 8.3 (*)    Total Protein 5.8 (*)    Albumin 2.7 (*)    GFR calc non Af Amer 53 (*)    All other  components within normal limits  CBC - Abnormal; Notable for the following components:   RBC 3.44 (*)    Hemoglobin 8.1 (*)    HCT 27.9 (*)    MCH 23.5 (*)    MCHC 29.0 (*)    RDW 17.2 (*)    All other components within normal limits     Na 138 K 3.6 Alb 2.7  Cr  stable,   Lab Results  Component Value Date   CREATININE 0.98 11/02/2017   CREATININE 1.10 (H) 10/27/2017   CREATININE 1.06 (H) 10/27/2017      WBC  6.1  HG/HCT   Stable     Component Value Date/Time   HGB 8.1 (L) 11/02/2017 1633   HCT 27.9 (L) 11/02/2017 1633       Troponin (Point of Care Test) No results for input(s): TROPIPOC in the last 72 hours.    BNP (last 3 results) No results for input(s): BNP in the last 8760 hours.  ProBNP (last 3 results) No results for input(s): PROBNP in the last 8760 hours.  Lactic Acid, Venous    Component Value Date/Time   LATICACIDVEN 1.16 01/21/2015 2008      UA not ordered    CXR -  NON acute    ECG:  Personally reviewed by me showing: HR : 106 Rhythm: A.fib w RVR  no evidence of ischemic changes QTC 485     ED Triage Vitals  Enc Vitals Group     BP 11/02/17 1615 131/73     Pulse Rate 11/02/17 1615 (!) 106     Resp 11/02/17 1615 18     Temp 11/02/17 1615 97.9 F (36.6 C)     Temp Source 11/02/17 1615 Oral     SpO2 11/02/17 1615 98 %     Weight 11/02/17 1615 207 lb (93.9 kg)     Height 11/02/17 1615 5\' 3"  (1.6 m)     Head Circumference --      Peak Flow --      Pain Score 11/02/17 1618 0     Pain Loc --      Pain Edu? --      Excl. in [place]? --   TMAX(24)@       Latest  Blood pressure (!) 142/93, pulse (!) 125, temperature 97.9 F (36.6 C), temperature source Oral, resp. rate (!) 21, height 5\' 3"  (1.6 m), weight 93.9 kg (207 lb), SpO2 97 %.      ER Provider Called:  Cardiology    Dr.Ganji They Recommend transfuse and admit to medicine Will see in AM   ER Provider Called:   GI   Dr. Carlean Purl They keep NPO post Midnight Will see in  AM   Hospitalist was called for admission for symptomatic anemia   Review of Systems:    Pertinent positives include:  Fatigue shortness of  breath at rest.   dyspnea on exertion,   Constitutional:  No weight loss, night sweats, Fevers, chills,, weight loss  HEENT:  No headaches, Difficulty swallowing,Tooth/dental problems,Sore throat,  No sneezing, itching, ear ache, nasal congestion, post nasal drip,  Cardio-vascular:  No chest pain, Orthopnea, PND, anasarca, dizziness, palpitations.no Bilateral lower extremity swelling  GI:  No heartburn, indigestion, abdominal pain, nausea, vomiting, diarrhea, change in bowel habits, loss of appetite, melena, blood in stool, hematemesis Resp:  no No excess mucus, no productive cough, No non-productive cough, No coughing up of blood.No change in color of mucus.No wheezing. Skin:  no rash or lesions. No jaundice GU:  no dysuria, change in color of urine, no urgency or frequency. No straining to urinate.  No flank pain.  Musculoskeletal:  No joint pain or no joint swelling. No decreased range of motion. No back pain.  Psych:  No change in mood or affect. No depression or anxiety. No memory loss.  Neuro: no localizing neurological complaints, no tingling, no weakness, no double vision, no gait abnormality, no slurred speech, no confusion  All systems reviewed and apart from [place] all are negative  Past Medical History:   Past Medical History:  Diagnosis Date  . Acalculous cholecystitis   . Arthritis   . Blood transfusion   . Blood transfusion without reported diagnosis   . Cataract    bilateral-removed from both eyes  . Coronary artery disease    mi  7/12,  stress test 12/21/2011=>negative for ischemia [name])  . Diabetes mellitus    metformin taken off-dr never dx'd her for DM-no meds, no sugar checks -no per dr Maudie Mercury  . Diverticulitis   . Dizzy   . Dyslipidemia   . Fatigue   . GERD (gastroesophageal reflux disease)   . Glaucoma     . Hypertension   . Hypokalemia   . Mitral regurgitation    mild to moderate, cardiac echo 12/02/2011, EF 51% [name])  . Myocardial infarction ([place])    7/12  . PONV (postoperative nausea and vomiting)    yrs ago  . Shortness of breath       Past Surgical History:  Procedure Laterality Date  . ABDOMINAL HYSTERECTOMY    . BILIARY DRAINAGE  9/12  . CARDIOVERSION N/A 02/12/2015   Procedure: CARDIOVERSION;  Surgeon: [name], MD;  Location: [place];  Service: Cardiovascular;  Laterality: N/A;  . CATARACT EXTRACTION     2  . CHOLECYSTECTOMY  02/25/2011   Procedure: LAPAROSCOPIC CHOLECYSTECTOMY WITH INTRAOPERATIVE CHOLANGIOGRAM;  Surgeon: Gayland Curry, MD;  Location: [place];  Service: General;  Laterality: N/A;  . CORONARY ANGIOPLASTY WITH STENT PLACEMENT  09/24/10   drug eluting stents  . PARATHYROIDECTOMY     tumor excision  . SHOULDER SURGERY     right  . SIGMOIDOSCOPY     flex sig in the 80's per pt.    Social History:  Ambulatory    walker      reports that she has never smoked. She has never used smokeless tobacco. She reports that she drinks alcohol. She reports that she does not use drugs.     Family History:   Family History  Problem Relation Age of Onset  . Heart disease Father   . Heart disease Brother   . Melanoma Son   . Cancer Maternal Grandfather   . Colon cancer Neg Hx   . Rectal cancer Neg Hx   . Stomach cancer Neg Hx  Allergies: Allergies  Allergen Reactions  . Dilaudid [Hydromorphone Hcl] Other (See Comments)    Caused patient psychological disturbances   . Aspirin     On xalerto  . Losartan Other (See Comments)    Per MAR  . Bactrim [Sulfamethoxazole-Trimethoprim] Rash  . Sulfur Rash     Prior to Admission medications   Medication Sig Start Date End Date Taking? Authorizing Provider  amoxicillin-clavulanate (AUGMENTIN) 875-125 MG tablet Take 1 tablet by mouth 2 (two) times daily. 10/27/17  Yes Deno Etienne, DO   clotrimazole-betamethasone (LOTRISONE) cream Apply 1 application topically 2 (two) times daily as needed (itching). Affected area 08/30/17  Yes [provider]  diltiazem (CARDIZEM CD) 120 MG 24 hr capsule Take 1 capsule (120 mg total) by mouth daily. 01/25/15  Yes [name], MD  dorzolamide (TRUSOPT) 2 % ophthalmic solution Place 1 drop into the left eye 2 (two) times daily.   Yes [provider]  ergocalciferol (VITAMIN D2) 50000 units capsule Take 50,000 Units by mouth once a week.   Yes [provider]  furosemide (LASIX) 40 MG tablet Take 40 mg by mouth as needed for edema.   Yes [provider]  irbesartan (AVAPRO) 300 MG tablet Take 150 mg by mouth daily.  09/03/17  Yes [provider]  latanoprost (XALATAN) 0.005 % ophthalmic solution Place 1 drop into the left eye at bedtime. 07/28/17  Yes [provider]  loratadine (CLARITIN) 10 MG tablet Take 10 mg by mouth daily.   Yes [provider]  methocarbamol (ROBAXIN) 500 MG tablet Take 500 mg by mouth 4 (four) times daily as needed (pain).    Yes [provider]  metoprolol tartrate (LOPRESSOR) 25 MG tablet Take 25 mg by mouth 2 (two) times daily.   Yes [provider]  Multiple Vitamins-Minerals (PRESERVISION AREDS 2) CAPS Take 1 capsule by mouth 2 (two) times daily.   Yes [provider]  nitroGLYCERIN (NITROSTAT) 0.4 MG SL tablet Place 0.4 mg under the tongue every 5 (five) minutes as needed for chest pain.   Yes [provider]  Propylene Glycol (SYSTANE BALANCE OP) Apply 1 drop to eye daily as needed (dry eyes).   Yes [provider]  rivaroxaban (XARELTO) 20 MG TABS tablet Take 20 mg by mouth daily with supper.   Yes [provider]  rosuvastatin (CRESTOR) 5 MG tablet Take 5 mg by mouth at bedtime.    Yes [provider]  amiodarone (PACERONE) 400 MG tablet Take 1 tablet (400 mg total) by mouth 2 (two) times  daily. Patient not taking: Reported on 10/27/2017 01/25/15   [name], MD  Rivaroxaban (XARELTO) 15 MG TABS tablet Take 1 tablet (15 mg total) by mouth daily with supper. Patient not taking: Reported on 11/02/2017 01/25/15   [name], MD   Physical Exam: Blood pressure (!) 142/93, pulse (!) 125, temperature 97.9 F (36.6 C), temperature source Oral, resp. rate (!) 21, height 5\' 3"  (1.6 m), weight 93.9 kg (207 lb), SpO2 97 %. 1. General:  in No Acute distress   Chronically ill -appearing 2. Psychological: Alert and   Oriented 3. Head/ENT:    Dry Mucous Membranes                          Head Non traumatic, neck supple  Poor Dentition 4. SKIN:  decreased Skin turgor,  Skin clean Dry and intact no rash 5. Heart: Regular rate and rhythm no  Murmur, no Rub or gallop 6. Lungs: no wheezes or crackles   7. Abdomen: Soft,  non-tender, Non distended  obese bowel sounds present 8. Lower extremities: no clubbing, cyanosis, or  edema 9. Neurologically Grossly intact, moving all 4 extremities equally   10. MSK: Normal range of motion   LABS:     Recent Labs  Lab 10/27/17 1031 10/27/17 1048 11/02/17 1633  WBC 6.7  --  6.1  NEUTROABS 4.1  --   --   HGB 8.4* 8.8* 8.1*  HCT 29.1* 26.0* 27.9*  MCV 81.7  --  81.1  PLT 321  --  841   Basic Metabolic Panel: Recent Labs  Lab 10/27/17 1031 10/27/17 1048 11/02/17 1633  NA 138 137 138  K 3.4* 3.4* 3.6  CL 101 98 104  CO2 28  --  24  GLUCOSE 113* 110* 97  BUN 18 19 18   CREATININE 1.06* 1.10* 0.98  CALCIUM 8.8*  --  8.3*      Recent Labs  Lab 10/27/17 1031 11/02/17 1633  AST 18 26  ALT 20 28  ALKPHOS 89 97  BILITOT 1.0 1.0  PROT 5.8* 5.8*  ALBUMIN 2.9* 2.7*   Recent Labs  Lab 10/27/17 1031  LIPASE 71*   No results for input(s): AMMONIA in the last 168 hours.    HbA1C: No results for input(s): HGBA1C in the last 72 hours. CBG: No results for input(s): GLUCAP in the last 168 hours.    Urine  analysis:    Component Value Date/Time   COLORURINE YELLOW 01/25/2015 2122   APPEARANCEUR CLEAR 01/25/2015 2122   LABSPEC 1.012 01/25/2015 2122   PHURINE 5.5 01/25/2015 2122   GLUCOSEU NEGATIVE 01/25/2015 2122   HGBUR NEGATIVE 01/25/2015 2122   BILIRUBINUR NEGATIVE 01/25/2015 2122   KETONESUR NEGATIVE 01/25/2015 2122   PROTEINUR NEGATIVE 01/25/2015 2122   UROBILINOGEN 0.2 01/25/2015 2122   NITRITE NEGATIVE 01/25/2015 2122   LEUKOCYTESUR NEGATIVE 01/25/2015 2122      Cultures:    Component Value Date/Time   SDES URINE, CLEAN CATCH 12/10/2010 0924   SPECREQUEST NONE 12/10/2010 0924   CULT  12/10/2010 0924    VANCOMYCIN RESISTANT ENTEROCOCCUS ISOLATED Note: CRITICAL RESULT CALLED TO, READ BACK BY AND VERIFIED WITH: KAMALA GOODE 12/14/10 @ 1415 BY BOVET    REPTSTATUS 12/14/2010 FINAL 12/10/2010 0924     Radiological Exams on Admission: Dg Chest 1 View  Result Date: 11/02/2017 CLINICAL DATA:  84 year old female with dizziness and anemia EXAM: CHEST  1 VIEW COMPARISON:  Prior chest x-ray 01/21/2015 FINDINGS: Stable cardiac and mediastinal contours. Low inspiratory volumes with chronic atelectasis versus scarring in the lingula. No focal airspace consolidation, pulmonary edema, large pleural effusion or pneumothorax. No acute osseous abnormality. Bilateral glenohumeral joint osteoarthritis. IMPRESSION: Stable chest x-ray without evidence of acute cardiopulmonary process. Electronically Signed   By: Jacqulynn Cadet M.D.   On: 11/02/2017 18:46    Chart has been reviewed    Assessment/Plan   84 y.o. female with medical history significant of A.fib, CAD, HTn, DM 2, HLD, Diastolic CHF, CKD, GERD, mitral regurgitation  Admitted for symptomatic anemia and A. fib with RVR  Present on Admission: . Atrial fibrillation with RVR (HCC) - - Admit to step down on Cardizem drip       CHA2D-VASC score 4       Hold anticoagulation  while being worked up for possible blood loss and may need  procedure in a.m.     check TSH      Cycle cardiac enzymes      Cardiology is aware will see in the morning      . GERD (gastroesophageal reflux disease) stable continue to monitor . Hyperlipidemia continue statin currently stable . Hypertension restart metoprolol for tonight once able to take p.o. restart home dose of Cardizem but for now on Cardizem drip to control heart rate . Pulmonary hypertension (HCC) chronic stable need to follow-up with pulmonology . Symptomatic anemia transfuse 1 unit and follow CBC, GI aware seen in the morning we will keep n.p.o. in case any procedure would be scheduled  Check anemia panel obtain Hemoccult stool DM 2 - patient states she is not really diabetic, currently diet controlled  Will order sliding scale for tonight History of diverticulitis improving clinically with p.o. antibiotics for now we will continue  Other plan as per orders.  DVT prophylaxis:  SCD     Code Status:  FULL CODE  as per patient   I had personally discussed CODE STATUS with patient and     Family Communication:   Family not at  Bedside    Disposition Plan:    To home once workup is complete and patient is stable                      Social Work  Consulted coming from [place] called: Cardiology Dr. Einar Gip GI Dr. Carlean Purl  Admission status:    inpatient       Level of care         SDU     Toy Baker 11/02/2017, 8:16 PM    [facility]  Pager [phone]   after 2 AM please page floor coverage PA If 7AM-7PM, please contact the day team taking care of the patient  [email or web address]  Password [email or web address]

## 2017-11-02 NOTE — ED Triage Notes (Signed)
Pt reports doctor sent her here for blood transfusion d/t Hgb of 7.5. Pt reports dizziness that has been going on for over a week. Pt was recently diagnosed with diverticulitis last week. Pt takes Xarelto. Denies any blood in stools. Denies pain.

## 2017-11-02 NOTE — ED Notes (Signed)
Attempted IV X 1   

## 2017-11-02 NOTE — ED Provider Notes (Signed)
[facility] EMERGENCY DEPARTMENT Provider Note   CSN: [id number] Arrival date & time: 11/02/17  1553     History   Chief Complaint Chief Complaint  Patient presents with  . Abnormal Lab  . Blood transfusion    HPI [name] is a 84 y.o. female.  HPI 83 year old female with history of hypertension, diabetes, CAD, A. fib, diastolic CHF comes in with chief complaint of dizziness and abnormal lab.  Patient is on Xarelto for her A. Fib.  According to the patient she has been having exertional dizziness and shortness of breath for the last 3 weeks.  Patient had come to the ED just a week ago, at that time she had some abdominal discomfort and was noted to have diverticulitis.  At the time of discharge patient was tachycardic and her hemoglobin was noted to be 8.5 -however we did not have a baseline hemoglobin, and decision was made to discharge patient with close follow-up.  Patient went to PCP today, her hemoglobin at the lab was 7.5 and she was tachycardic therefore they sent her to the ED for transfusion.  Patient denies any bloody stools or dark tarry stools.  She also denies any chest pain.  Patient is noted to have A. fib with RVR, with heart rate between 110-140 at rest.  She denies any palpitations.  Past Medical History:  Diagnosis Date  . Acalculous cholecystitis   . Arthritis   . Blood transfusion   . Blood transfusion without reported diagnosis   . Cataract    bilateral-removed from both eyes  . Coronary artery disease    mi  7/12,  stress test 12/21/2011=>negative for ischemia [name])  . Diabetes mellitus    metformin taken off-dr never dx'd her for DM-no meds, no sugar checks -no per dr Maudie Mercury  . Diverticulitis   . Dizzy   . Dyslipidemia   . Fatigue   . GERD (gastroesophageal reflux disease)   . Glaucoma   . Hypertension   . Hypokalemia   . Mitral regurgitation    mild to moderate, cardiac echo 12/02/2011, EF 51% [name])  .  Myocardial infarction ([place])    7/12  . PONV (postoperative nausea and vomiting)    yrs ago  . Shortness of breath     Patient Active Problem List   Diagnosis Date Noted  . Atrial fibrillation with RVR ([place]) 11/02/2017  . Symptomatic anemia 11/02/2017  . Atrial fibrillation ([place]) 01/21/2015  . Diarrhea 04/24/2014  . S/P laparoscopic cholecystectomy 02/26/2011  . Hypertension 02/05/2011  . Myocardial infarction ([place]) 02/05/2011  . Hyperlipidemia 02/05/2011  . Diabetes mellitus, type II ([place]) 02/05/2011  . GERD (gastroesophageal reflux disease) 02/05/2011  . Anxiety 02/05/2011  . Depression 02/05/2011  . Hepatic steatosis 02/05/2011  . Pulmonary hypertension ([place]) 02/05/2011    Past Surgical History:  Procedure Laterality Date  . ABDOMINAL HYSTERECTOMY    . BILIARY DRAINAGE  9/12  . CARDIOVERSION N/A 02/12/2015   Procedure: CARDIOVERSION;  Surgeon: [name], MD;  Location: [facility];  Service: Cardiovascular;  Laterality: N/A;  . CATARACT EXTRACTION     2  . CHOLECYSTECTOMY  02/25/2011   Procedure: LAPAROSCOPIC CHOLECYSTECTOMY WITH INTRAOPERATIVE CHOLANGIOGRAM;  Surgeon: Gayland Curry, MD;  Location: [facility];  Service: General;  Laterality: N/A;  . CORONARY ANGIOPLASTY WITH STENT PLACEMENT  09/24/10   drug eluting stents  . PARATHYROIDECTOMY     tumor excision  . SHOULDER SURGERY     right  .  SIGMOIDOSCOPY     flex sig in the 80's per pt.     OB History   None      Home Medications    Prior to Admission medications   Medication Sig Start Date End Date Taking? Authorizing Provider  amoxicillin-clavulanate (AUGMENTIN) 875-125 MG tablet Take 1 tablet by mouth 2 (two) times daily. 10/27/17  Yes Deno Etienne, DO  clotrimazole-betamethasone (LOTRISONE) cream Apply 1 application topically 2 (two) times daily as needed (itching). Affected area 08/30/17  Yes [provider]  diltiazem (CARDIZEM CD) 120 MG 24 hr capsule Take 1 capsule (120 mg total) by mouth daily.  01/25/15  Yes [name], MD  dorzolamide (TRUSOPT) 2 % ophthalmic solution Place 1 drop into the left eye 2 (two) times daily.   Yes [provider]  ergocalciferol (VITAMIN D2) 50000 units capsule Take 50,000 Units by mouth once a week.   Yes [provider]  furosemide (LASIX) 40 MG tablet Take 40 mg by mouth as needed for edema.   Yes [provider]  irbesartan (AVAPRO) 300 MG tablet Take 150 mg by mouth daily.  09/03/17  Yes [provider]  latanoprost (XALATAN) 0.005 % ophthalmic solution Place 1 drop into the left eye at bedtime. 07/28/17  Yes [provider]  loratadine (CLARITIN) 10 MG tablet Take 10 mg by mouth daily.   Yes [provider]  methocarbamol (ROBAXIN) 500 MG tablet Take 500 mg by mouth 4 (four) times daily as needed (pain).    Yes [provider]  metoprolol tartrate (LOPRESSOR) 25 MG tablet Take 25 mg by mouth 2 (two) times daily.   Yes [provider]  Multiple Vitamins-Minerals (PRESERVISION AREDS 2) CAPS Take 1 capsule by mouth 2 (two) times daily.   Yes [provider]  nitroGLYCERIN (NITROSTAT) 0.4 MG SL tablet Place 0.4 mg under the tongue every 5 (five) minutes as needed for chest pain.   Yes [provider]  Propylene Glycol (SYSTANE BALANCE OP) Apply 1 drop to eye daily as needed (dry eyes).   Yes [provider]  rivaroxaban (XARELTO) 20 MG TABS tablet Take 20 mg by mouth daily with supper.   Yes [provider]  rosuvastatin (CRESTOR) 5 MG tablet Take 5 mg by mouth at bedtime.    Yes [provider]  amiodarone (PACERONE) 400 MG tablet Take 1 tablet (400 mg total) by mouth 2 (two) times daily. Patient not taking: Reported on 10/27/2017 01/25/15   [name], MD  Rivaroxaban (XARELTO) 15 MG TABS tablet Take 1 tablet (15 mg total) by mouth daily with supper. Patient not taking: Reported on 11/02/2017 01/25/15   [name], MD    Family History Family  History  Problem Relation Age of Onset  . Heart disease Father   . Heart disease Brother   . Melanoma Son   . Cancer Maternal Grandfather   . Colon cancer Neg Hx   . Rectal cancer Neg Hx   . Stomach cancer Neg Hx     Social History Social History   Tobacco Use  . Smoking status: Never Smoker  . Smokeless tobacco: Never Used  Substance Use Topics  . Alcohol use: Yes    Alcohol/week: 0.0 oz    Comment: socially-few drinks on the weekends  . Drug use: No     Allergies   Dilaudid [hydromorphone hcl]; Aspirin; Losartan; Bactrim [sulfamethoxazole-trimethoprim]; and Sulfur   Review of Systems Review of Systems  Constitutional: Positive for activity change.  Respiratory: Positive for shortness of breath.   Cardiovascular: Negative for chest pain.  Gastrointestinal: Positive for abdominal pain. Negative for blood in stool, nausea and vomiting.  Neurological: Positive for dizziness.  All other systems reviewed and are negative.    Physical Exam Updated Vital Signs BP (!) 141/88   Pulse (!) 121   Temp 97.6 F (36.4 C) (Oral)   Resp 18   Ht 5\' 3"  (1.6 m)   Wt 93.9 kg (207 lb)   SpO2 100%   BMI 36.67 kg/m   Physical Exam  Constitutional: She is oriented to person, place, and time. She appears well-developed.  HENT:  Head: Normocephalic and atraumatic.  Eyes: EOM are normal.  Neck: Normal range of motion. Neck supple.  Cardiovascular:  Irregularly irregular with tachycardia  Pulmonary/Chest: Effort normal.  Abdominal: Soft. Bowel sounds are normal. There is tenderness.  Neurological: She is alert and oriented to person, place, and time.  Skin: Skin is warm and dry.  Nursing note and vitals reviewed.    ED Treatments / Results  Labs (all labs ordered are listed, but only abnormal results are displayed) Labs Reviewed  COMPREHENSIVE METABOLIC PANEL - Abnormal; Notable for the following components:      Result Value   Calcium 8.3 (*)    Total Protein 5.8 (*)     Albumin 2.7 (*)    GFR calc non Af Amer 53 (*)    All other components within normal limits  CBC - Abnormal; Notable for the following components:   RBC 3.44 (*)    Hemoglobin 8.1 (*)    HCT 27.9 (*)    MCH 23.5 (*)    MCHC 29.0 (*)    RDW 17.2 (*)    All other components within normal limits  VITAMIN B12  FOLATE  IRON AND TIBC  FERRITIN  RETICULOCYTES  DIFFERENTIAL  TROPONIN I  TROPONIN I  TROPONIN I  CBC  POC OCCULT BLOOD, ED  TYPE AND SCREEN  PREPARE RBC (CROSSMATCH)    EKG None ED ECG REPORT   Date: 11/02/2017  Rate: 102  Rhythm: atrial fibrillation  QRS Axis: left  Intervals: normal  ST/T Wave abnormalities: nonspecific ST/T changes  Conduction Disutrbances:nonspecific intraventricular conduction delay  Narrative Interpretation:   Old EKG Reviewed: unchanged  I have personally reviewed the EKG tracing and agree with the computerized printout as noted.   Radiology Dg Chest 1 View  Result Date: 11/02/2017 CLINICAL DATA:  84 year old female with dizziness and anemia EXAM: CHEST  1 VIEW COMPARISON:  Prior chest x-ray 01/21/2015 FINDINGS: Stable cardiac and mediastinal contours. Low inspiratory volumes with chronic atelectasis versus scarring in the lingula. No focal airspace consolidation, pulmonary edema, large pleural effusion or pneumothorax. No acute osseous abnormality. Bilateral glenohumeral joint osteoarthritis. IMPRESSION: Stable chest x-ray without evidence of acute cardiopulmonary process. Electronically Signed   By: Jacqulynn Cadet M.D.   On: 11/02/2017 18:46    Procedures Procedures (including critical care time CRITICAL CARE Performed by: Demetris Capell   Total critical care time: 42 minutes  Critical care time was exclusive of separately billable procedures and treating other patients.  Critical care was necessary to treat or prevent imminent or life-threatening deterioration.  Critical care was time spent personally by me on the  following activities: development of treatment plan with patient and/or surrogate as well as nursing, discussions with consultants, evaluation of patient's response to treatment, examination of patient, obtaining history from patient or surrogate, ordering and performing treatments and interventions, ordering  and review of laboratory studies, ordering and review of radiographic studies, pulse oximetry and re-evaluation of patient's condition.   Medications Ordered in ED Medications  sodium chloride 0.9 % bolus 500 mL (500 mLs Intravenous New Bag/Given 11/02/17 1844)  0.9 %  sodium chloride infusion (10 mL/hr Intravenous New Bag/Given 11/02/17 1913)  diltiazem (CARDIZEM) tablet 30 mg (30 mg Oral Given 11/02/17 1928)     Initial Impression / Assessment and Plan / ED Course  I have reviewed the triage vital signs and the nursing notes.  Pertinent labs & imaging results that were available during my care of the patient were reviewed by me and considered in my medical decision making (see chart for details).  Clinical Course as of Nov 02 2032  Wed Nov 02, 2017  2031 Dr. Silvano Rusk from GI service will see the patient tomorrow.  He requested patient be kept n.p.o. after midnight.   [AN]    Clinical Course User Index [AN] Varney Biles, MD    84 year old female with history of A. fib on Xarelto, diastolic CHF and recent visit for diverticulitis comes in with chief complaint of worsening exertional dyspnea.  Patient is noted to be in A. fib with RVR, and her outpatient labs showed hemoglobin of 7.5.  She was essentially sent to the ER for evaluation of her anemia and transfusion.  Patient is also noted to be in A. fib with RVR.  It is possible that her symptoms are because of her A. fib and not the anemia.  I spoke with Dr. Einar Gip, cardiologist -and he informed me that patient was seen by him 2 weeks ago, at which time patient was in A. fib but her heart rate was normal and her labs had shown  normal-appearing CBC.  The son thereafter shows lab values from his outpatient clinic, which clearly shows that her hemoglobin was higher than 13 on 08/2016 and greater than 11 earlier this year.  It seems like patient's hemoglobin has steadily come down.  She is not having any large-volume active bleeding, however there could be occult bleeding.  Based on the information in front of [place], it seems like the A. fib RVR is driven by anemia -and therefore we will transfuse patient with 1 unit of PRBC here.  Medicine will admit.  Dr. Einar Gip will see the patient tomorrow.  30 mg of oral diltiazem has been ordered.  We will be conservative with the management of A. fib with RVR, given the underlying process likely appears to be symptomatic anemia.   Final Clinical Impressions(s) / ED Diagnoses   Final diagnoses:  Symptomatic anemia  Atrial fibrillation with RVR [facility])    ED Discharge Orders    None       Varney Biles, MD 11/02/17 2034

## 2017-11-03 DIAGNOSIS — K5730 Diverticulosis of large intestine without perforation or abscess without bleeding: Secondary | ICD-10-CM

## 2017-11-03 LAB — IRON AND TIBC
IRON: 122 ug/dL (ref 28–170)
SATURATION RATIOS: 21 % (ref 10.4–31.8)
TIBC: 568 ug/dL — ABNORMAL HIGH (ref 250–450)
UIBC: 446 ug/dL

## 2017-11-03 LAB — COMPREHENSIVE METABOLIC PANEL
ALT: 25 U/L (ref 0–44)
ANION GAP: 9 (ref 5–15)
AST: 23 U/L (ref 15–41)
Albumin: 2.5 g/dL — ABNORMAL LOW (ref 3.5–5.0)
Alkaline Phosphatase: 90 U/L (ref 38–126)
BUN: 16 mg/dL (ref 8–23)
CALCIUM: 8.2 mg/dL — AB (ref 8.9–10.3)
CO2: 26 mmol/L (ref 22–32)
Chloride: 103 mmol/L (ref 98–111)
Creatinine, Ser: 0.88 mg/dL (ref 0.44–1.00)
GFR calc non Af Amer: 60 mL/min — ABNORMAL LOW (ref >60)
Glucose, Bld: 122 mg/dL — ABNORMAL HIGH (ref 70–99)
Potassium: 3.6 mmol/L (ref 3.5–5.1)
SODIUM: 138 mmol/L (ref 135–145)
TOTAL PROTEIN: 5.6 g/dL — AB (ref 6.5–8.1)
Total Bilirubin: 1.5 mg/dL — ABNORMAL HIGH (ref 0.3–1.2)

## 2017-11-03 LAB — CBC
HCT: 29.1 % — ABNORMAL LOW (ref 36.0–46.0)
HEMATOCRIT: 30.1 % — AB (ref 36.0–46.0)
Hemoglobin: 9.1 g/dL — ABNORMAL LOW (ref 12.0–15.0)
Hemoglobin: 8.7 g/dL — ABNORMAL LOW (ref 12.0–15.0)
MCH: 24.1 pg — ABNORMAL LOW (ref 26.0–34.0)
MCH: 23.8 pg — ABNORMAL LOW (ref 26.0–34.0)
MCHC: 30.2 g/dL (ref 30.0–36.0)
MCHC: 29.9 g/dL — AB (ref 30.0–36.0)
MCV: 79.6 fL (ref 78.0–100.0)
MCV: 79.5 fL (ref 78.0–100.0)
PLATELETS: 246 10*3/uL (ref 150–400)
Platelets: 254 10*3/uL (ref 150–400)
RBC: 3.78 MIL/uL — AB (ref 3.87–5.11)
RBC: 3.66 MIL/uL — ABNORMAL LOW (ref 3.87–5.11)
RDW: 16.4 % — AB (ref 11.5–15.5)
RDW: 16.5 % — ABNORMAL HIGH (ref 11.5–15.5)
WBC: 6.3 10*3/uL (ref 4.0–10.5)
WBC: 6.2 10*3/uL (ref 4.0–10.5)

## 2017-11-03 LAB — PHOSPHORUS: PHOSPHORUS: 3.7 mg/dL (ref 2.5–4.6)

## 2017-11-03 LAB — DIFFERENTIAL
Abs Immature Granulocytes: 0 10*3/uL (ref 0.0–0.1)
BASOS ABS: 0 10*3/uL (ref 0.0–0.1)
BASOS PCT: 0 %
EOS ABS: 0.1 10*3/uL (ref 0.0–0.7)
Eosinophils Relative: 2 %
Immature Granulocytes: 0 %
Lymphocytes Relative: 27 %
Lymphs Abs: 1.7 10*3/uL (ref 0.7–4.0)
MONO ABS: 0.6 10*3/uL (ref 0.1–1.0)
Monocytes Relative: 9 %
NEUTROS PCT: 62 %
Neutro Abs: 3.9 10*3/uL (ref 1.7–7.7)

## 2017-11-03 LAB — GLUCOSE, CAPILLARY
GLUCOSE-CAPILLARY: 101 mg/dL — AB (ref 70–99)
GLUCOSE-CAPILLARY: 113 mg/dL — AB (ref 70–99)
GLUCOSE-CAPILLARY: 118 mg/dL — AB (ref 70–99)
GLUCOSE-CAPILLARY: 139 mg/dL — AB (ref 70–99)

## 2017-11-03 LAB — TROPONIN I
Troponin I: <0.03 ng/mL (ref <0.03)
Troponin I: <0.03 ng/mL (ref <0.03)

## 2017-11-03 LAB — FERRITIN: Ferritin: 15 ng/mL (ref 11–307)

## 2017-11-03 LAB — VITAMIN B12: VITAMIN B 12: 370 pg/mL (ref 180–914)

## 2017-11-03 LAB — HEMOGLOBIN A1C
HEMOGLOBIN A1C: 6.1 % — AB (ref 4.8–5.6)
MEAN PLASMA GLUCOSE: 128.37 mg/dL

## 2017-11-03 LAB — RETICULOCYTES
RBC.: 3.8 MIL/uL — AB (ref 3.87–5.11)
RETIC COUNT ABSOLUTE: 76 10*3/uL (ref 19.0–186.0)
Retic Ct Pct: 2 % (ref 0.4–3.1)

## 2017-11-03 LAB — MAGNESIUM: MAGNESIUM: 1.8 mg/dL (ref 1.7–2.4)

## 2017-11-03 LAB — TSH: TSH: 0.866 u[IU]/mL (ref 0.350–4.500)

## 2017-11-03 LAB — MRSA PCR SCREENING: MRSA by PCR: NEGATIVE

## 2017-11-03 LAB — FOLATE: FOLATE: 7.1 ng/mL (ref >5.9)

## 2017-11-03 MED ORDER — SODIUM CHLORIDE 0.9 % IV SOLN
INTRAVENOUS | Status: DC
  Administered 2017-11-03: 23:00:00 via INTRAVENOUS

## 2017-11-03 MED ORDER — POTASSIUM CHLORIDE CRYS ER 20 MEQ PO TBCR
40.0000 meq | EXTENDED_RELEASE_TABLET | Freq: Two times a day (BID) | ORAL | Status: AC
  Administered 2017-11-03 (×2): 40 meq via ORAL
  Filled 2017-11-03 (×2): qty 2

## 2017-11-03 MED ORDER — FUROSEMIDE 10 MG/ML IJ SOLN
20.0000 mg | Freq: Two times a day (BID) | INTRAMUSCULAR | Status: AC
  Administered 2017-11-03 (×2): 20 mg via INTRAVENOUS
  Filled 2017-11-03 (×2): qty 2

## 2017-11-03 NOTE — Consult Note (Signed)
Reason for Consult: Anemia, Afib Referring Physician: [facility]  [name] is an 84 y.o. female.  HPI:   84 y/o Caucasian female with hypertension, hyperlipidemia, CAD s/p LAD PCI in 2012, HFpEF, borderline DM, severe degenerative disc disease, persistent Afib on Xarelto, admitted to the hospital on 11/02/2017 with fatigue and found be severely anemic, requiring blood transfusion. Source of bleeding is not clear and GI workup is pending. Her Afib has been fairly rate controlled on diltiazem drip.   Patient has been feeling fatigued and lightheaded for a while. She was recently treated for diverticulitis with antibiotics. She has had recent nausea, vomiting, occasional diarrhea, but denies any melena or balck stools that she has noticed.   Past Medical History:  Diagnosis Date  . Acalculous cholecystitis   . Arthritis   . Blood transfusion   . Blood transfusion without reported diagnosis   . Cataract    bilateral-removed from both eyes  . Coronary artery disease    mi  7/12,  stress test 12/21/2011=>negative for ischemia [name])  . Diabetes mellitus    metformin taken off-dr never dx'd her for DM-no meds, no sugar checks -no per dr Maudie Mercury  . Diverticulitis   . Dizzy   . Dyslipidemia   . Fatigue   . GERD (gastroesophageal reflux disease)   . Glaucoma   . Hypertension   . Hypokalemia   . Mitral regurgitation    mild to moderate, cardiac echo 12/02/2011, EF 51% [name])  . Myocardial infarction ([place])    7/12  . PONV (postoperative nausea and vomiting)    yrs ago  . Shortness of breath     Past Surgical History:  Procedure Laterality Date  . ABDOMINAL HYSTERECTOMY    . BILIARY DRAINAGE  9/12  . CARDIOVERSION N/A 02/12/2015   Procedure: CARDIOVERSION;  Surgeon: [name], MD;  Location: [facility];  Service: Cardiovascular;  Laterality: N/A;  . CATARACT EXTRACTION     2  . CHOLECYSTECTOMY  02/25/2011   Procedure: LAPAROSCOPIC CHOLECYSTECTOMY WITH  INTRAOPERATIVE CHOLANGIOGRAM;  Surgeon: Gayland Curry, MD;  Location: [facility];  Service: General;  Laterality: N/A;  . CORONARY ANGIOPLASTY WITH STENT PLACEMENT  09/24/10   drug eluting stents  . PARATHYROIDECTOMY     tumor excision  . SHOULDER SURGERY     right  . SIGMOIDOSCOPY     flex sig in the 80's per pt.    Family History  Problem Relation Age of Onset  . Heart disease Father   . Heart disease Brother   . Melanoma Son   . Cancer Maternal Grandfather   . Colon cancer Neg Hx   . Rectal cancer Neg Hx   . Stomach cancer Neg Hx     Social History:  reports that she has never smoked. She has never used smokeless tobacco. She reports that she drinks alcohol. She reports that she does not use drugs.  Allergies:  Allergies  Allergen Reactions  . Dilaudid [Hydromorphone Hcl] Other (See Comments)    Caused patient psychological disturbances   . Aspirin     On xalerto  . Losartan Other (See Comments)    Per MAR  . Bactrim [Sulfamethoxazole-Trimethoprim] Rash  . Sulfur Rash    Medications: I have reviewed the patient's current medications.  Results for orders placed or performed during the hospital encounter of 11/02/17 (from the past 48 hour(s))  Type and screen [place]     Status: None (Preliminary result)  Collection Time: 11/02/17  4:30 PM  Result Value Ref Range   ABO/RH(D) A POS    Antibody Screen NEG    Sample Expiration 11/05/2017    Unit Number [id number]    Blood Component Type RED CELLS,LR    Unit division 00    Status of Unit ISSUED    Transfusion Status OK TO TRANSFUSE    Crossmatch Result Compatible    Unit Number [id number]    Blood Component Type RED CELLS,LR    Unit division 00    Status of Unit ALLOCATED    Transfusion Status OK TO TRANSFUSE    Crossmatch Result Compatible    Unit Number [id number]    Blood Component Type RED CELLS,LR    Unit division 00    Status of Unit ALLOCATED    Transfusion Status OK TO  TRANSFUSE    Crossmatch Result      Compatible Performed at [facility], [place] [address], [place], [place] [address]   Comprehensive metabolic panel     Status: Abnormal   Collection Time: 11/02/17  4:33 PM  Result Value Ref Range   Sodium 138 135 - 145 mmol/L   Potassium 3.6 3.5 - 5.1 mmol/L   Chloride 104 98 - 111 mmol/L   CO2 24 22 - 32 mmol/L   Glucose, Bld 97 70 - 99 mg/dL   BUN 18 8 - 23 mg/dL   Creatinine, Ser 0.98 0.44 - 1.00 mg/dL   Calcium 8.3 (L) 8.9 - 10.3 mg/dL   Total Protein 5.8 (L) 6.5 - 8.1 g/dL   Albumin 2.7 (L) 3.5 - 5.0 g/dL   AST 26 15 - 41 U/L   ALT 28 0 - 44 U/L   Alkaline Phosphatase 97 38 - 126 U/L   Total Bilirubin 1.0 0.3 - 1.2 mg/dL   GFR calc non Af Amer 53 (L) >60 mL/min   GFR calc Af Amer >60 >60 mL/min    Comment: (NOTE) The eGFR has been calculated using the CKD EPI equation. This calculation has not been validated in all clinical situations. eGFR's persistently <60 mL/min signify possible Chronic Kidney Disease.    Anion gap 10 5 - 15    Comment: Performed at [facility] [address], [place], [place] [address]  CBC     Status: Abnormal   Collection Time: 11/02/17  4:33 PM  Result Value Ref Range   WBC 6.1 4.0 - 10.5 K/uL   RBC 3.44 (L) 3.87 - 5.11 MIL/uL   Hemoglobin 8.1 (L) 12.0 - 15.0 g/dL   HCT 27.9 (L) 36.0 - 46.0 %   MCV 81.1 78.0 - 100.0 fL   MCH 23.5 (L) 26.0 - 34.0 pg   MCHC 29.0 (L) 30.0 - 36.0 g/dL   RDW 17.2 (H) 11.5 - 15.5 %   Platelets 254 150 - 400 K/uL    Comment: Performed at [facility] [address], [place], [place] [address]  Prepare RBC     Status: None   Collection Time: 11/02/17  6:31 PM  Result Value Ref Range   Order Confirmation      ORDER PROCESSED BY BLOOD BANK Performed at [facility], [place] [address] [place], [place] [address]   MRSA PCR Screening     Status: None   Collection Time: 11/02/17 11:24 PM  Result Value Ref Range   MRSA by PCR NEGATIVE NEGATIVE     Comment:  The GeneXpert MRSA Assay (FDA approved for NASAL specimens only), is one component of a comprehensive MRSA colonization surveillance program. It is not intended to diagnose MRSA infection nor to guide or monitor treatment for MRSA infections. Performed at [facility], [place] [address] [place], [place] [address]   Vitamin B12     Status: None   Collection Time: 11/02/17 11:27 PM  Result Value Ref Range   Vitamin B-12 370 180 - 914 pg/mL    Comment: (NOTE) This assay is not validated for testing neonatal or myeloproliferative syndrome specimens for Vitamin B12 levels. Performed at [facility], [place] [address], [place], [place], [place] [address]   Folate     Status: None   Collection Time: 11/02/17 11:27 PM  Result Value Ref Range   Folate 7.1 >5.9 ng/mL    Comment: Performed at [facility], [place] [address], [place], [place] [address]  Iron and TIBC     Status: Abnormal   Collection Time: 11/02/17 11:27 PM  Result Value Ref Range   Iron 122 28 - 170 ug/dL   TIBC 568 (H) 250 - 450 ug/dL   Saturation Ratios 21 10.4 - 31.8 %   UIBC 446 ug/dL    Comment: Performed at [facility], [place] [address], [place], [place] [address]  Ferritin     Status: None   Collection Time: 11/02/17 11:27 PM  Result Value Ref Range   Ferritin 15 11 - 307 ng/mL    Comment: Performed at [facility], [place] [address], [place], [place] [address]  Reticulocytes     Status: Abnormal   Collection Time: 11/02/17 11:27 PM  Result Value Ref Range   Retic Ct Pct 2.0 0.4 - 3.1 %   RBC. 3.80 (L) 3.87 - 5.11 MIL/uL   Retic Count, Absolute 76.0 19.0 - 186.0 K/uL    Comment: Performed at [facility] [address], [place], [place] [address]  Troponin I (q 6hr x 3)     Status: None   Collection Time: 11/02/17 11:27 PM  Result Value Ref Range   Troponin I <0.03 <0.03 ng/mL    Comment: Performed at [facility] [address], [place], [place] [address]  CBC      Status: Abnormal   Collection Time: 11/02/17 11:27 PM  Result Value Ref Range   WBC 6.3 4.0 - 10.5 K/uL   RBC 3.78 (L) 3.87 - 5.11 MIL/uL   Hemoglobin 9.1 (L) 12.0 - 15.0 g/dL   HCT 30.1 (L) 36.0 - 46.0 %   MCV 79.6 78.0 - 100.0 fL   MCH 24.1 (L) 26.0 - 34.0 pg   MCHC 30.2 30.0 - 36.0 g/dL   RDW 16.4 (H) 11.5 - 15.5 %   Platelets 246 150 - 400 K/uL    Comment: Performed at [facility] [address], [place], [place] [address]  Differential     Status: None   Collection Time: 11/02/17 11:27 PM  Result Value Ref Range   Neutrophils Relative % 62 %   Neutro Abs 3.9 1.7 - 7.7 K/uL   Lymphocytes Relative 27 %   Lymphs Abs 1.7 0.7 - 4.0 K/uL   Monocytes Relative 9 %   Monocytes Absolute 0.6 0.1 - 1.0 K/uL   Eosinophils Relative 2 %   Eosinophils Absolute 0.1 0.0 - 0.7 K/uL   Basophils Relative 0 %   Basophils Absolute 0.0 0.0 - 0.1 K/uL  Immature Granulocytes 0 %   Abs Immature Granulocytes 0.0 0.0 - 0.1 K/uL    Comment: Performed at [facility], [place] [address] [place], [place] [address]  Glucose, capillary     Status: Abnormal   Collection Time: 11/03/17 12:24 AM  Result Value Ref Range   Glucose-Capillary 139 (H) 70 - 99 mg/dL  Troponin I (q 6hr x 3)     Status: None   Collection Time: 11/03/17  3:28 AM  Result Value Ref Range   Troponin I <0.03 <0.03 ng/mL    Comment: Performed at [facility], [place] [address], [place], [place] [address]  Hemoglobin A1c     Status: Abnormal   Collection Time: 11/03/17  3:28 AM  Result Value Ref Range   Hgb A1c MFr Bld 6.1 (H) 4.8 - 5.6 %    Comment: (NOTE) Pre diabetes:          5.7%-6.4% Diabetes:              >6.4% Glycemic control for   <7.0% adults with diabetes    Mean Plasma Glucose 128.37 mg/dL    Comment: Performed at [facility] [address], [place], [place] [address]  Magnesium     Status: None   Collection Time: 11/03/17  3:28 AM  Result Value Ref Range   Magnesium 1.8 1.7 - 2.4 mg/dL     Comment: Performed at [facility] [address], [place], [place] [address]  Phosphorus     Status: None   Collection Time: 11/03/17  3:28 AM  Result Value Ref Range   Phosphorus 3.7 2.5 - 4.6 mg/dL    Comment: Performed at [facility], [place] [address] [place], [place] [address]  TSH     Status: None   Collection Time: 11/03/17  3:28 AM  Result Value Ref Range   TSH 0.866 0.350 - 4.500 uIU/mL    Comment: Performed by a 3rd Generation assay with a functional sensitivity of <=0.01 uIU/mL. Performed at [facility], [place] [address] [place], [place] [address]   Comprehensive metabolic panel     Status: Abnormal   Collection Time: 11/03/17  3:28 AM  Result Value Ref Range   Sodium 138 135 - 145 mmol/L   Potassium 3.6 3.5 - 5.1 mmol/L   Chloride 103 98 - 111 mmol/L   CO2 26 22 - 32 mmol/L   Glucose, Bld 122 (H) 70 - 99 mg/dL   BUN 16 8 - 23 mg/dL   Creatinine, Ser 0.88 0.44 - 1.00 mg/dL   Calcium 8.2 (L) 8.9 - 10.3 mg/dL   Total Protein 5.6 (L) 6.5 - 8.1 g/dL   Albumin 2.5 (L) 3.5 - 5.0 g/dL   AST 23 15 - 41 U/L   ALT 25 0 - 44 U/L   Alkaline Phosphatase 90 38 - 126 U/L   Total Bilirubin 1.5 (H) 0.3 - 1.2 mg/dL   GFR calc non Af Amer 60 (L) >60 mL/min   GFR calc Af Amer >60 >60 mL/min    Comment: (NOTE) The eGFR has been calculated using the CKD EPI equation. This calculation has not been validated in all clinical situations. eGFR's persistently <60 mL/min signify possible Chronic Kidney Disease.    Anion gap 9 5 - 15    Comment: Performed at [facility] [address], [place], [place] [address]  CBC     Status: Abnormal   Collection  Time: 11/03/17  3:28 AM  Result Value Ref Range   WBC 6.2 4.0 - 10.5 K/uL   RBC 3.66 (L) 3.87 - 5.11 MIL/uL   Hemoglobin 8.7 (L) 12.0 - 15.0 g/dL   HCT 29.1 (L) 36.0 - 46.0 %   MCV 79.5 78.0 - 100.0 fL   MCH 23.8 (L) 26.0 - 34.0 pg   MCHC 29.9 (L) 30.0 - 36.0 g/dL   RDW 16.5 (H) 11.5 - 15.5 %   Platelets 254 150  - 400 K/uL    Comment: Performed at [facility], [place] [address], [place], [place] [address]  Glucose, capillary     Status: Abnormal   Collection Time: 11/03/17  3:29 AM  Result Value Ref Range   Glucose-Capillary 118 (H) 70 - 99 mg/dL  Glucose, capillary     Status: Abnormal   Collection Time: 11/03/17  8:01 AM  Result Value Ref Range   Glucose-Capillary 101 (H) 70 - 99 mg/dL   Comment 1 Notify RN    Comment 2 Document in Chart     Dg Chest 1 View  Result Date: 11/02/2017 CLINICAL DATA:  84 year old female with dizziness and anemia EXAM: CHEST  1 VIEW COMPARISON:  Prior chest x-ray 01/21/2015 FINDINGS: Stable cardiac and mediastinal contours. Low inspiratory volumes with chronic atelectasis versus scarring in the lingula. No focal airspace consolidation, pulmonary edema, large pleural effusion or pneumothorax. No acute osseous abnormality. Bilateral glenohumeral joint osteoarthritis. IMPRESSION: Stable chest x-ray without evidence of acute cardiopulmonary process. Electronically Signed   By: Jacqulynn Cadet M.D.   On: 11/02/2017 18:46    Review of Systems  Constitutional: Positive for malaise/fatigue.  HENT: Negative.   Eyes: Negative.   Respiratory: Negative for shortness of breath.   Cardiovascular: Positive for leg swelling. Negative for chest pain and palpitations.  Gastrointestinal: Positive for abdominal pain, nausea and vomiting. Negative for blood in stool and melena.  Genitourinary: Negative.   Musculoskeletal: Negative.   Skin: Negative.   Neurological: Positive for dizziness. Negative for loss of consciousness.  Endo/Heme/Allergies: Does not bruise/bleed easily (Although found to be anemic).  Psychiatric/Behavioral: Negative.   All other systems reviewed and are negative.  Blood pressure 129/83, pulse 82, temperature 98.1 F (36.7 C), temperature source Oral, resp. rate 20, height _0  (1.6 m), weight 93.9 kg, SpO2 97 %. Physical Exam  Nursing note and vitals  reviewed. Constitutional: She is oriented to person, place, and time. She appears well-developed and well-nourished. No distress.  HENT:  Head: Normocephalic and atraumatic.  Neck: Normal range of motion. Neck supple. No JVD present.  Cardiovascular: Normal rate. An irregularly irregular rhythm present. Exam reveals no gallop.  No murmur heard. Respiratory: Effort normal and breath sounds normal. No respiratory distress. She has no wheezes. She has no rales.  GI: Soft. Bowel sounds are normal. There is no tenderness.  Musculoskeletal: Normal range of motion. She exhibits edema (2+ b/l).  Lymphadenopathy:    She has no cervical adenopathy.  Neurological: She is alert and oriented to person, place, and time. She has normal reflexes. No cranial nerve deficit.  Skin: Skin is warm and dry.  Psychiatric: She has a normal mood and affect.     Cardiac studies: EKG 11/02/2017: Afib w/ventricular rate 102 bpm. RBBB  Echocardiogram 01/22/2015: Study Conclusions  - Left ventricle: The cavity size was normal. There was mild   concentric hypertrophy. Systolic function was normal. The   estimated ejection fraction was in the range of 60% to  65%. Wall   motion was normal; there were no regional wall motion   abnormalities. The study is not technically sufficient to allow   evaluation of LV diastolic function. - Mitral valve: Calcified annulus. There was mild regurgitation. - Left atrium: The atrium was moderately dilated. - Atrial septum: No defect or patent foramen ovale was identified. - Pulmonary arteries: PA peak pressure: 34 mm Hg (S).  Impressions:  - The right ventricular systolic pressure was increased consistent   with mild pulmonary hypertension.  Assessment/Recommendations:  84 y/o Caucasian female with hypertension, hyperlipidemia, CAD s/p LAD PCI in 2012, HFpEF, borderline DM, severe degenerative disc disease, persistent Afib on Xarelto, now admitted with symptomatic  anemia  Persistent Afib: CHA2DS2VAsc score 5, annual stroke risk 7.2% VR near 100. Brief RVR episodes likely driven by underlying anemia. Agree with management of anemia, as you are doing. Agree with diltiazem drip for now. Could transition to diltiazem PO 120 mg once she take take PO regularly. Continue metoprolol 25 mg PO bid. Patient was on amiodarone in the past that was discontinued due to prolonged QT interval.   Symptomatic anemia: Agree with stopping Xarelto until bleeding source is identified and managed.   CAD:  Stable. Continue crestor.   Hypertension: Controlled.  Tiawanna Luchsinger J Granvil Djordjevic 11/03/2017, 9:33 AM   [place], MD [facility] Pager: [phone] Office: [phone] If no answer Cell [phone]

## 2017-11-03 NOTE — H&P (View-Only) (Signed)
Consultation  Referring Provider:   [facility] Primary Care Physician:  Jani Gravel, MD Primary Gastroenterologist: Dr. Olevia Perches        Reason for Consultation: Acute on chronic anemia, on Xarelto for A. fib         HPI:   [name] is a 84 y.o. female with a past medical history of diabetes, CAD, D CHF (echo 2016 EF 60-65%), A. fib on Xarelto, as well as others listed below, who presented to the ED on 11/02/2017 with a complaint of symptomatic anemia.    Recent ED visit 10/27/2017 with lower abdominal pain, she thought this pain may be cardiac and took 2 nitroglycerin and her blood pressure fell into the 60s and EMS gave her IVF, in the ED her hemoglobin was down to 8.4 but they thought possibly hemodilution from IVF.  CT scan at that time showed mild wall thickening and she was sent home with Augmentin for question of diverticulitis.    Today, reports that from a GI standpoint she has been well other than some lower abdominal pain which sent her to the ED as above.  Patient denies a change in bowel habits or seeing any bright red blood or black tarry sticky stool.  Aware that she may need to have procedures in order to evaluate her anemia further and wants to have them done "as quick as possible so I can get out of here".  Prior to admission was experiencing some exertional dizziness and shortness of breath.   Denies fever, chills, weight loss, change in bowel habits, nausea, vomiting, heartburn or reflux.  ED course: Hemoglobin 8.1 (baseline 11-12), noted to be in A. fib with RVR  GI history: March 2016-colonoscopy: Diverticulosis into the ascending sessile polyps less than 1 cm removed, pathology showed tubular adenoma without high-grade dysplasia  Past Medical History:  Diagnosis Date  . Acalculous cholecystitis   . Arthritis   . Blood transfusion   . Blood transfusion without reported diagnosis   . Cataract    bilateral-removed from both eyes  . Coronary artery  disease    mi  7/12,  stress test 12/21/2011=>negative for ischemia [name])  . Diabetes mellitus    metformin taken off-dr never dx'd her for DM-no meds, no sugar checks -no per dr Maudie Mercury  . Diverticulitis   . Dizzy   . Dyslipidemia   . Fatigue   . GERD (gastroesophageal reflux disease)   . Glaucoma   . Hypertension   . Hypokalemia   . Mitral regurgitation    mild to moderate, cardiac echo 12/02/2011, EF 51% [name])  . Myocardial infarction ([place])    7/12  . PONV (postoperative nausea and vomiting)    yrs ago  . Shortness of breath     Past Surgical History:  Procedure Laterality Date  . ABDOMINAL HYSTERECTOMY    . BILIARY DRAINAGE  9/12  . CARDIOVERSION N/A 02/12/2015   Procedure: CARDIOVERSION;  Surgeon: [name], MD;  Location: [facility];  Service: Cardiovascular;  Laterality: N/A;  . CATARACT EXTRACTION     2  . CHOLECYSTECTOMY  02/25/2011   Procedure: LAPAROSCOPIC CHOLECYSTECTOMY WITH INTRAOPERATIVE CHOLANGIOGRAM;  Surgeon: Gayland Curry, MD;  Location: [facility];  Service: General;  Laterality: N/A;  . CORONARY ANGIOPLASTY WITH STENT PLACEMENT  09/24/10   drug eluting stents  . PARATHYROIDECTOMY     tumor excision  . SHOULDER SURGERY     right  . SIGMOIDOSCOPY  flex sig in the 80's per pt.    Family History  Problem Relation Age of Onset  . Heart disease Father   . Heart disease Brother   . Melanoma Son   . Cancer Maternal Grandfather   . Colon cancer Neg Hx   . Rectal cancer Neg Hx   . Stomach cancer Neg Hx     Social History   Tobacco Use  . Smoking status: Never Smoker  . Smokeless tobacco: Never Used  Substance Use Topics  . Alcohol use: Yes    Alcohol/week: 0.0 standard drinks    Comment: socially-few drinks on the weekends  . Drug use: No    Prior to Admission medications   Medication Sig Start Date End Date Taking? Authorizing Provider  amoxicillin-clavulanate (AUGMENTIN) 875-125 MG tablet Take 1 tablet by mouth 2 (two) times daily.  10/27/17  Yes Deno Etienne, DO  clotrimazole-betamethasone (LOTRISONE) cream Apply 1 application topically 2 (two) times daily as needed (itching). Affected area 08/30/17  Yes [provider]  diltiazem (CARDIZEM CD) 120 MG 24 hr capsule Take 1 capsule (120 mg total) by mouth daily. 01/25/15  Yes [name], MD  dorzolamide (TRUSOPT) 2 % ophthalmic solution Place 1 drop into the left eye 2 (two) times daily.   Yes [provider]  ergocalciferol (VITAMIN D2) 50000 units capsule Take 50,000 Units by mouth once a week.   Yes [provider]  furosemide (LASIX) 40 MG tablet Take 40 mg by mouth as needed for edema.   Yes [provider]  irbesartan (AVAPRO) 300 MG tablet Take 150 mg by mouth daily.  09/03/17  Yes [provider]  latanoprost (XALATAN) 0.005 % ophthalmic solution Place 1 drop into the left eye at bedtime. 07/28/17  Yes [provider]  loratadine (CLARITIN) 10 MG tablet Take 10 mg by mouth daily.   Yes [provider]  methocarbamol (ROBAXIN) 500 MG tablet Take 500 mg by mouth 4 (four) times daily as needed (pain).    Yes [provider]  metoprolol tartrate (LOPRESSOR) 25 MG tablet Take 25 mg by mouth 2 (two) times daily.   Yes [provider]  Multiple Vitamins-Minerals (PRESERVISION AREDS 2) CAPS Take 1 capsule by mouth 2 (two) times daily.   Yes [provider]  nitroGLYCERIN (NITROSTAT) 0.4 MG SL tablet Place 0.4 mg under the tongue every 5 (five) minutes as needed for chest pain.   Yes [provider]  Propylene Glycol (SYSTANE BALANCE OP) Apply 1 drop to eye daily as needed (dry eyes).   Yes [provider]  rivaroxaban (XARELTO) 20 MG TABS tablet Take 20 mg by mouth daily with supper.   Yes [provider]  rosuvastatin (CRESTOR) 5 MG tablet Take 5 mg by mouth at bedtime.    Yes [provider]  amiodarone (PACERONE) 400 MG tablet Take 1 tablet (400 mg total) by  mouth 2 (two) times daily. Patient not taking: Reported on 10/27/2017 01/25/15   [name], MD  Rivaroxaban (XARELTO) 15 MG TABS tablet Take 1 tablet (15 mg total) by mouth daily with supper. Patient not taking: Reported on 11/02/2017 01/25/15   [name], MD    Current Facility-Administered Medications  Medication Dose Route Frequency Provider Last Rate Last Dose  . acetaminophen (TYLENOL) tablet 650 mg  650 mg Oral Q6H PRN Doutova, Anastassia, MD       Or  . acetaminophen (TYLENOL) suppository 650 mg  650 mg Rectal Q6H PRN Doutova, Anastassia,  MD      . amoxicillin-clavulanate (AUGMENTIN) 875-125 MG per tablet 1 tablet  1 tablet Oral BID Toy Baker, MD   1 tablet at 11/03/17 1048  . diltiazem (CARDIZEM) 100 mg in dextrose 5% 154mL (1 mg/mL) infusion  5-15 mg/hr Intravenous Continuous Doutova, Anastassia, MD 5 mL/hr at 11/03/17 0400 5 mg/hr at 11/03/17 0400  . dorzolamide (TRUSOPT) 2 % ophthalmic solution 1 drop  1 drop Left Eye BID Toy Baker, MD   1 drop at 11/03/17 1045  . furosemide (LASIX) injection 20 mg  20 mg Intravenous Q12H Domenic Polite, MD   20 mg at 11/03/17 1100  . HYDROcodone-acetaminophen (NORCO/VICODIN) 5-325 MG per tablet 1-2 tablet  1-2 tablet Oral Q4H PRN Doutova, Anastassia, MD      . latanoprost (XALATAN) 0.005 % ophthalmic solution 1 drop  1 drop Left Eye QHS Doutova, Anastassia, MD      . metoprolol tartrate (LOPRESSOR) tablet 25 mg  25 mg Oral BID Toy Baker, MD   25 mg at 11/03/17 1048  . ondansetron (ZOFRAN) tablet 4 mg  4 mg Oral Q6H PRN Doutova, Anastassia, MD       Or  . ondansetron (ZOFRAN) injection 4 mg  4 mg Intravenous Q6H PRN Doutova, Anastassia, MD      . potassium chloride SA (K-DUR,KLOR-CON) CR tablet 40 mEq  40 mEq Oral BID Domenic Polite, MD   40 mEq at 11/03/17 1100  . rosuvastatin (CRESTOR) tablet 5 mg  5 mg Oral QHS Doutova, Anastassia, MD   5 mg at 11/03/17 0025    Allergies as of 11/02/2017 - Review Complete  11/02/2017  Allergen Reaction Noted  . Dilaudid [hydromorphone hcl] Other (See Comments) 01/21/2011  . Aspirin  10/27/2017  . Losartan Other (See Comments) 11/02/2017  . Bactrim [sulfamethoxazole-trimethoprim] Rash 11/02/2017  . Sulfur Rash 10/14/2010     Review of Systems:    Constitutional: No weight loss, fever or chills Skin: No rash  Cardiovascular: No chest pain Respiratory: +DOE Gastrointestinal: See HPI and otherwise negative Genitourinary: No dysuria Neurological: +positional dizziness Musculoskeletal: No new muscle or joint pain Hematologic: No bleeding Psychiatric: No history of depression or anxiety    Physical Exam:  Vital signs in last 24 hours: Temp:  [97.4 F (36.3 C)-98.7 F (37.1 C)] 98.1 F (36.7 C) (08/08 0729) Pulse Rate:  [82-125] 111 (08/08 1045) Resp:  [16-22] 20 (08/08 0729) BP: (103-157)/(53-100) 139/93 (08/08 1045) SpO2:  [95 %-100 %] 97 % (08/08 0729) Weight:  [93.9 kg] 93.9 kg (08/07 1615) Last BM Date: 11/02/17 General:   Pleasant Elderly Caucasian female appears to be in NAD, Well developed, Well nourished, alert and cooperative Head:  Normocephalic and atraumatic. Eyes:   PEERL, EOMI. No icterus. Conjunctiva pink. Ears:  Normal auditory acuity. Neck:  Supple Throat: Oral cavity and pharynx without inflammation, swelling or lesion.  Lungs: Respirations even and unlabored. Lungs clear to auscultation bilaterally.   No wheezes, crackles, or rhonchi.  Heart: Normal S1, S2. No MRG. Regular rate and rhythm. No peripheral edema, cyanosis or pallor.  Abdomen:  Soft, nondistended, nontender. No rebound or guarding. Normal bowel sounds. No appreciable masses or hepatomegaly. Rectal:  Not performed.  Msk:  Symmetrical without gross deformities. Peripheral pulses intact.  Extremities:  Without edema, no deformity or joint abnormality.  Neurologic:  Alert and  oriented x4;  grossly normal neurologically.  Skin:   Dry and intact without significant  lesions or rashes. Psychiatric: Demonstrates good judgement and reason without abnormal affect  or behaviors.  LAB RESULTS: Recent Labs    11/02/17 1633 11/02/17 2327 11/03/17 0328  WBC 6.1 6.3 6.2  HGB 8.1* 9.1* 8.7*  HCT 27.9* 30.1* 29.1*  PLT 254 246 254   BMET Recent Labs    11/02/17 1633 11/03/17 0328  NA 138 138  K 3.6 3.6  CL 104 103  CO2 24 26  GLUCOSE 97 122*  BUN 18 16  CREATININE 0.98 0.88  CALCIUM 8.3* 8.2*   LFT Recent Labs    11/03/17 0328  PROT 5.6*  ALBUMIN 2.5*  AST 23  ALT 25  ALKPHOS 90  BILITOT 1.5*    STUDIES: Dg Chest 1 View  Result Date: 11/02/2017 CLINICAL DATA:  84 year old female with dizziness and anemia EXAM: CHEST  1 VIEW COMPARISON:  Prior chest x-ray 01/21/2015 FINDINGS: Stable cardiac and mediastinal contours. Low inspiratory volumes with chronic atelectasis versus scarring in the lingula. No focal airspace consolidation, pulmonary edema, large pleural effusion or pneumothorax. No acute osseous abnormality. Bilateral glenohumeral joint osteoarthritis. IMPRESSION: Stable chest x-ray without evidence of acute cardiopulmonary process. Electronically Signed   By: Jacqulynn Cadet M.D.   On: 11/02/2017 18:46   CT ABDOMEN AND PELVIS WITH CONTRAST  TECHNIQUE: Multidetector CT imaging of the abdomen and pelvis was performed using the standard protocol following bolus administration of intravenous contrast.  CONTRAST:  119mL OMNIPAQUE IOHEXOL 300 MG/ML  SOLN  COMPARISON:  12/04/2010  FINDINGS: Lower chest: The heart size is enlarged. Calcifications within the thoracic aorta, lad coronary artery noted. No acute findings identified.  Hepatobiliary: Stable partially exophytic lesion within segment 2 of the liver measures 2.2 cm, image 21/3. Also unchanged is a focal area of low attenuation within the posterior aspect of segment 8 measuring 1 cm, image 21/3. There is a low-density structure within segment 5 adjacent to the  gallbladder fossa measuring 1.6 cm. Not seen on previous exam. This is indeterminate. Previous cholecystectomy. No significant biliary dilatation.  Pancreas: Unremarkable. No pancreatic ductal dilatation or surrounding inflammatory changes.  Spleen: Normal in size without focal abnormality.  Adrenals/Urinary Tract: Normal adrenal glands.  Bilateral renal cortical scarring and lobulation is identified and appears similar to the previous exam. No kidney mass or hydronephrosis identified.  Stomach/Bowel: The stomach appears normal. The small bowel loops are unremarkable. The appendix is visualized and appears normal. There is extensive sigmoid diverticulosis identified. Mild wall thickening involving the sigmoid colon is identified which may reflect low-grade, uncomplicated sigmoid diverticulitis. No perforation or abscess identified.  Vascular/Lymphatic: Aortic atherosclerosis. No aneurysm. No abdominal or pelvic adenopathy.  Reproductive: Status post hysterectomy. No adnexal masses.  Other: No free fluid or fluid collections identified.  Musculoskeletal: Mild lumbar scoliosis and multi level spondylosis.  IMPRESSION: 1. There is extensive sigmoid diverticulosis with mild wall thickening suspicious for uncomplicated acute diverticulitis. No perforation or abscess formation identified. 2. There is a new low-attenuation structure within segment 5 of the liver adjacent to the gallbladder fossa, favor focal fatty deposition. Suggest followup imaging in 3-6 months to ensure stability. At this time the study of choice would be a contrast enhanced liver protocol MRI. The other low-density liver lesions are unchanged from 12/04/2010 and are likely to represent benign abnormalities. 3.  Aortic Atherosclerosis (ICD10-I70.0). 4. Coronary artery atherosclerotic calcifications noted.   Electronically Signed   By: Kerby Moors M.D.   On: 10/27/2017 13:01   Impression /  Plan:   Impression: 1.  Acute on chronic anemia: Hemoglobin 12.8 (01/21/2015)--> 8.4 (10/27/2017)--> 8.1 (11/02/2017)--> 1  unit PRBCs--> 9.1--> 8.7; etc. GI source of blood loss 2.  GERD: Controlled with PPI 3.  A. fib with RVR: Patient currently on Cardizem drip cardiology is following- on Xarelto, last dose 8/6-pm 4.  GERD 5.  History of diverticulitis: Diagnosed 10/27/2017 patient started on Cipro and Flagyl pain is improved, CT as above 10/27/2017 with extensive sigmoid diverticulosis with mild wall thickening suspicious for uncomplicated acute diverticulitis  Plan: 1.  Agree with monitoring hemoglobin every 8 with transfusion as needed less than 7 2.  Continue supportive measures 3.  At this time patient will undergo EGD tomorrow with Dr. Lyndel Safe.  Did discuss risk, benefits, limitations and alternatives and patient agrees to proceed. 4.  Patient may need to undergo colonoscopy in the future but this is contraindicated at the moment given recent episode of diverticulitis for which the patient is still on antibiotics.  Recommendations are to wait 4-6 weeks after finishing antibiotics to proceed with colonoscopy. 5.  Patient may be on heart healthy diet today and n.p.o. after midnight. 6. Continue to hold xarelto 7.  Please await any further recommendations from Dr. Lyndel Safe later today.  Thank you for your kind consultation, we will continue to follow.  Lavone Nian Lemmon  11/03/2017, 11:14 AM   Attending physician's note   I have taken an interval history, reviewed the chart and examined the patient. I agree with the Advanced Practitioner's note, impression and recommendations.   84 year old with DM, CAD, A Fib on Xeralto, DCHF with acute on chronic anemia. No overt GI bleeding. Has diverticulitis on CT on Cipro and Flagyl. Last colonoscopy 05/2014: 1 cm ascending colon sessile tubular adenoma, diverticulosis. Last dose of Xeralto 8/6. Plan: EGD in AM. Trend CBC and transfuse as needed. Colonoscopy  in 4-6 weeks after finishing antibiotics for acute diverticulitis. CT reviewed  Carmell Austria, MD

## 2017-11-03 NOTE — Progress Notes (Signed)
PROGRESS NOTE    [name]  FBP:[id number] DOB: 10/14/1933 DOA: 11/02/2017 PCP: Jani Gravel, MD  Brief Narrative: 84 y/o Caucasian female with hypertension, hyperlipidemia, CAD s/p LAD PCI in 2012, HFpEF, borderline DM, severe degenerative disc disease, persistent Afib on Xarelto, admitted to the hospital on 11/02/2017 with fatigue and found be severely anemic to 8, from baseline of 11-12 years ago, requiring blood transfusion  Assessment & Plan:     Iron deficiency anemia -Likely subacute, so expect chronic intermittent low-grade bleed from full dose anticoagulation with Xarelto, History not suggestive of upper GI bleed, colonoscopy 3 years ago noted some benign polyps -Status post 1 unit PRBC, gastroenterology consulted, anticoagulation on hold -Suspect some degree of hemodilution from volume overloaded state also contributing to anemia -Monitor CBC  Atrial fibrillation with RVR -on low-dose Cardizem drip at 5 mg per hour, Xarelto held -volume overloaded as well, transitioning to oral Cardizem once volume status better -continue home dose of metoprolol  Acute on chronic diastolic CHF -Worsened in the setting of anemia and A. Fib RVR -transfused, IV Lasix 2 doses today -Monitor volume status closely  CAD -Stable, continue metoprolol and Crestor  Hypertension -stable, continue IV Lasix today  Recent mild diverticulitis -diagnosed by CT on 8/1, treated with Augmentin -continue same to complete ten-day course  DVT prophylaxis:SCDs Code Status: full code Family Communication:no family at bedside Disposition Plan: home pending workup  Consultants:   Gastroenterology Dr. Lyndel Safe  Cardiology Dr.Patwardhan   Procedures:   Antimicrobials:    Subjective: -feels okay overall, reports mild fatigue and dyspnea on exertion for 2 weeks  Objective: Vitals:   11/03/17 0333 11/03/17 0729 11/03/17 1045 11/03/17 1152  BP: (!) 103/53 129/83 (!) 139/93 125/81  Pulse: 95  82 (!) 111 88  Resp: 16 20  17   Temp: 98.1 F (36.7 C) 98.1 F (36.7 C)  97.8 F (36.6 C)  TempSrc: Oral Oral  Oral  SpO2: 99% 97%  100%  Weight:      Height:        Intake/Output Summary (Last 24 hours) at 11/03/2017 1322 Last data filed at 11/03/2017 1304 Gross per 24 hour  Intake 1500.87 ml  Output 451 ml  Net 1049.87 ml   Filed Weights   11/02/17 1615  Weight: 93.9 kg    Examination:  General exam: Appears calm and comfortable  Respiratory system: decreased breath positive basis, rest clear Cardiovascular system: S1 & S2 heard, irregularly irregular  Gastrointestinal system: Abdomen is nondistended, soft and nontender.Normal bowel sounds heard. Central nervous system: Alert and oriented. No focal neurological deficits. Extremities: 2+ edema Skin: No rashes, lesions or ulcers Psychiatry: Judgement and insight appear normal. Mood & affect appropriate.     Data Reviewed:   CBC: Recent Labs  Lab 11/02/17 1633 11/02/17 2327 11/03/17 0328  WBC 6.1 6.3 6.2  NEUTROABS  --  3.9  --   HGB 8.1* 9.1* 8.7*  HCT 27.9* 30.1* 29.1*  MCV 81.1 79.6 79.5  PLT 254 246 824   Basic Metabolic Panel: Recent Labs  Lab 11/02/17 1633 11/03/17 0328  NA 138 138  K 3.6 3.6  CL 104 103  CO2 24 26  GLUCOSE 97 122*  BUN 18 16  CREATININE 0.98 0.88  CALCIUM 8.3* 8.2*  MG  --  1.8  PHOS  --  3.7   GFR: Estimated Creatinine Clearance: 54.6 mL/min (by C-G formula based on SCr of 0.88 mg/dL). Liver Function Tests: Recent Labs  Lab 11/02/17 1633 11/03/17  0328  AST 26 23  ALT 28 25  ALKPHOS 97 90  BILITOT 1.0 1.5*  PROT 5.8* 5.6*  ALBUMIN 2.7* 2.5*   No results for input(s): LIPASE, AMYLASE in the last 168 hours. No results for input(s): AMMONIA in the last 168 hours. Coagulation Profile: No results for input(s): INR, PROTIME in the last 168 hours. Cardiac Enzymes: Recent Labs  Lab 11/02/17 2327 11/03/17 0328  TROPONINI <0.03 <0.03   BNP (last 3 results) No  results for input(s): PROBNP in the last 8760 hours. HbA1C: Recent Labs    11/03/17 0328  HGBA1C 6.1*   CBG: Recent Labs  Lab 11/03/17 0024 11/03/17 0329 11/03/17 0801 11/03/17 1223  GLUCAP 139* 118* 101* 113*   Lipid Profile: No results for input(s): CHOL, HDL, LDLCALC, TRIG, CHOLHDL, LDLDIRECT in the last 72 hours. Thyroid Function Tests: Recent Labs    11/03/17 0328  TSH 0.866   Anemia Panel: Recent Labs    11/02/17 2327  VITAMINB12 370  FOLATE 7.1  FERRITIN 15  TIBC 568*  IRON 122  RETICCTPCT 2.0   Urine analysis:    Component Value Date/Time   COLORURINE YELLOW 01/25/2015 2122   APPEARANCEUR CLEAR 01/25/2015 2122   LABSPEC 1.012 01/25/2015 2122   PHURINE 5.5 01/25/2015 2122   GLUCOSEU NEGATIVE 01/25/2015 2122   HGBUR NEGATIVE 01/25/2015 2122   BILIRUBINUR NEGATIVE 01/25/2015 2122   KETONESUR NEGATIVE 01/25/2015 2122   PROTEINUR NEGATIVE 01/25/2015 2122   UROBILINOGEN 0.2 01/25/2015 2122   NITRITE NEGATIVE 01/25/2015 2122   LEUKOCYTESUR NEGATIVE 01/25/2015 2122   Sepsis Labs: @LABRCNTIP (procalcitonin:4,lacticidven:4)  ) Recent Results (from the past 240 hour(s))  MRSA PCR Screening     Status: None   Collection Time: 11/02/17 11:24 PM  Result Value Ref Range Status   MRSA by PCR NEGATIVE NEGATIVE Final    Comment:        The GeneXpert MRSA Assay (FDA approved for NASAL specimens only), is one component of a comprehensive MRSA colonization surveillance program. It is not intended to diagnose MRSA infection nor to guide or monitor treatment for MRSA infections. Performed at [facility], [place] [address], [place], [place] [address]          Radiology Studies: Dg Chest 1 View  Result Date: 11/02/2017 CLINICAL DATA:  84 year old female with dizziness and anemia EXAM: CHEST  1 VIEW COMPARISON:  Prior chest x-ray 01/21/2015 FINDINGS: Stable cardiac and mediastinal contours. Low inspiratory volumes with chronic atelectasis versus  scarring in the lingula. No focal airspace consolidation, pulmonary edema, large pleural effusion or pneumothorax. No acute osseous abnormality. Bilateral glenohumeral joint osteoarthritis. IMPRESSION: Stable chest x-ray without evidence of acute cardiopulmonary process. Electronically Signed   By: Jacqulynn Cadet M.D.   On: 11/02/2017 18:46        Scheduled Meds: . amoxicillin-clavulanate  1 tablet Oral BID  . dorzolamide  1 drop Left Eye BID  . furosemide  20 mg Intravenous Q12H  . latanoprost  1 drop Left Eye QHS  . metoprolol tartrate  25 mg Oral BID  . potassium chloride  40 mEq Oral BID  . rosuvastatin  5 mg Oral QHS   Continuous Infusions: . diltiazem (CARDIZEM) infusion 5 mg/hr (11/03/17 1304)     LOS: 1 day    Time spent: 54min    Domenic Polite, MD [facility] Page via [email or web address], password [email or web address] After 7PM please contact night-coverage  11/03/2017, 1:22 PM

## 2017-11-03 NOTE — Progress Notes (Signed)
Son called; concerned for pt; states she has been without food for too long; updated about potential for GI doc to see her; reassured MD will see pt as soon as possible & alter diet as necessary.   [place]  Nero Sawatzky, RN

## 2017-11-03 NOTE — Consult Note (Addendum)
Consultation  Referring Provider:   [facility] Primary Care Physician:  Jani Gravel, MD Primary Gastroenterologist: Dr. Olevia Perches        Reason for Consultation: Acute on chronic anemia, on Xarelto for A. fib         HPI:   [name] is a 84 y.o. female with a past medical history of diabetes, CAD, D CHF (echo 2016 EF 60-65%), A. fib on Xarelto, as well as others listed below, who presented to the ED on 11/02/2017 with a complaint of symptomatic anemia.    Recent ED visit 10/27/2017 with lower abdominal pain, she thought this pain may be cardiac and took 2 nitroglycerin and her blood pressure fell into the 60s and EMS gave her IVF, in the ED her hemoglobin was down to 8.4 but they thought possibly hemodilution from IVF.  CT scan at that time showed mild wall thickening and she was sent home with Augmentin for question of diverticulitis.    Today, reports that from a GI standpoint she has been well other than some lower abdominal pain which sent her to the ED as above.  Patient denies a change in bowel habits or seeing any bright red blood or black tarry sticky stool.  Aware that she may need to have procedures in order to evaluate her anemia further and wants to have them done "as quick as possible so I can get out of here".  Prior to admission was experiencing some exertional dizziness and shortness of breath.   Denies fever, chills, weight loss, change in bowel habits, nausea, vomiting, heartburn or reflux.  ED course: Hemoglobin 8.1 (baseline 11-12), noted to be in A. fib with RVR  GI history: March 2016-colonoscopy: Diverticulosis into the ascending sessile polyps less than 1 cm removed, pathology showed tubular adenoma without high-grade dysplasia  Past Medical History:  Diagnosis Date  . Acalculous cholecystitis   . Arthritis   . Blood transfusion   . Blood transfusion without reported diagnosis   . Cataract    bilateral-removed from both eyes  . Coronary artery  disease    mi  7/12,  stress test 12/21/2011=>negative for ischemia [name])  . Diabetes mellitus    metformin taken off-dr never dx'd her for DM-no meds, no sugar checks -no per dr Maudie Mercury  . Diverticulitis   . Dizzy   . Dyslipidemia   . Fatigue   . GERD (gastroesophageal reflux disease)   . Glaucoma   . Hypertension   . Hypokalemia   . Mitral regurgitation    mild to moderate, cardiac echo 12/02/2011, EF 51% [name])  . Myocardial infarction ([place])    7/12  . PONV (postoperative nausea and vomiting)    yrs ago  . Shortness of breath     Past Surgical History:  Procedure Laterality Date  . ABDOMINAL HYSTERECTOMY    . BILIARY DRAINAGE  9/12  . CARDIOVERSION N/A 02/12/2015   Procedure: CARDIOVERSION;  Surgeon: [name], MD;  Location: [place];  Service: Cardiovascular;  Laterality: N/A;  . CATARACT EXTRACTION     2  . CHOLECYSTECTOMY  02/25/2011   Procedure: LAPAROSCOPIC CHOLECYSTECTOMY WITH INTRAOPERATIVE CHOLANGIOGRAM;  Surgeon: Gayland Curry, MD;  Location: [place];  Service: General;  Laterality: N/A;  . CORONARY ANGIOPLASTY WITH STENT PLACEMENT  09/24/10   drug eluting stents  . PARATHYROIDECTOMY     tumor excision  . SHOULDER SURGERY     right  . SIGMOIDOSCOPY  flex sig in the 80's per pt.    Family History  Problem Relation Age of Onset  . Heart disease Father   . Heart disease Brother   . Melanoma Son   . Cancer Maternal Grandfather   . Colon cancer Neg Hx   . Rectal cancer Neg Hx   . Stomach cancer Neg Hx     Social History   Tobacco Use  . Smoking status: Never Smoker  . Smokeless tobacco: Never Used  Substance Use Topics  . Alcohol use: Yes    Alcohol/week: 0.0 standard drinks    Comment: socially-few drinks on the weekends  . Drug use: No    Prior to Admission medications   Medication Sig Start Date End Date Taking? Authorizing Provider  amoxicillin-clavulanate (AUGMENTIN) 875-125 MG tablet Take 1 tablet by mouth 2 (two) times daily.  10/27/17  Yes Deno Etienne, DO  clotrimazole-betamethasone (LOTRISONE) cream Apply 1 application topically 2 (two) times daily as needed (itching). Affected area 08/30/17  Yes [provider]  diltiazem (CARDIZEM CD) 120 MG 24 hr capsule Take 1 capsule (120 mg total) by mouth daily. 01/25/15  Yes [name], MD  dorzolamide (TRUSOPT) 2 % ophthalmic solution Place 1 drop into the left eye 2 (two) times daily.   Yes [provider]  ergocalciferol (VITAMIN D2) 50000 units capsule Take 50,000 Units by mouth once a week.   Yes [provider]  furosemide (LASIX) 40 MG tablet Take 40 mg by mouth as needed for edema.   Yes [provider]  irbesartan (AVAPRO) 300 MG tablet Take 150 mg by mouth daily.  09/03/17  Yes [provider]  latanoprost (XALATAN) 0.005 % ophthalmic solution Place 1 drop into the left eye at bedtime. 07/28/17  Yes [provider]  loratadine (CLARITIN) 10 MG tablet Take 10 mg by mouth daily.   Yes [provider]  methocarbamol (ROBAXIN) 500 MG tablet Take 500 mg by mouth 4 (four) times daily as needed (pain).    Yes [provider]  metoprolol tartrate (LOPRESSOR) 25 MG tablet Take 25 mg by mouth 2 (two) times daily.   Yes [provider]  Multiple Vitamins-Minerals (PRESERVISION AREDS 2) CAPS Take 1 capsule by mouth 2 (two) times daily.   Yes [provider]  nitroGLYCERIN (NITROSTAT) 0.4 MG SL tablet Place 0.4 mg under the tongue every 5 (five) minutes as needed for chest pain.   Yes [provider]  Propylene Glycol (SYSTANE BALANCE OP) Apply 1 drop to eye daily as needed (dry eyes).   Yes [provider]  rivaroxaban (XARELTO) 20 MG TABS tablet Take 20 mg by mouth daily with supper.   Yes [provider]  rosuvastatin (CRESTOR) 5 MG tablet Take 5 mg by mouth at bedtime.    Yes [provider]  amiodarone (PACERONE) 400 MG tablet Take 1 tablet (400 mg total) by  mouth 2 (two) times daily. Patient not taking: Reported on 10/27/2017 01/25/15   [name], MD  Rivaroxaban (XARELTO) 15 MG TABS tablet Take 1 tablet (15 mg total) by mouth daily with supper. Patient not taking: Reported on 11/02/2017 01/25/15   [name], MD    Current Facility-Administered Medications  Medication Dose Route Frequency Provider Last Rate Last Dose  . acetaminophen (TYLENOL) tablet 650 mg  650 mg Oral Q6H PRN Doutova, Anastassia, MD       Or  . acetaminophen (TYLENOL) suppository 650 mg  650 mg Rectal Q6H PRN Doutova, Anastassia,  MD      . amoxicillin-clavulanate (AUGMENTIN) 875-125 MG per tablet 1 tablet  1 tablet Oral BID Toy Baker, MD   1 tablet at 11/03/17 1048  . diltiazem (CARDIZEM) 100 mg in dextrose 5% 184mL (1 mg/mL) infusion  5-15 mg/hr Intravenous Continuous Doutova, Anastassia, MD 5 mL/hr at 11/03/17 0400 5 mg/hr at 11/03/17 0400  . dorzolamide (TRUSOPT) 2 % ophthalmic solution 1 drop  1 drop Left Eye BID Toy Baker, MD   1 drop at 11/03/17 1045  . furosemide (LASIX) injection 20 mg  20 mg Intravenous Q12H Domenic Polite, MD   20 mg at 11/03/17 1100  . HYDROcodone-acetaminophen (NORCO/VICODIN) 5-325 MG per tablet 1-2 tablet  1-2 tablet Oral Q4H PRN Doutova, Anastassia, MD      . latanoprost (XALATAN) 0.005 % ophthalmic solution 1 drop  1 drop Left Eye QHS Doutova, Anastassia, MD      . metoprolol tartrate (LOPRESSOR) tablet 25 mg  25 mg Oral BID Toy Baker, MD   25 mg at 11/03/17 1048  . ondansetron (ZOFRAN) tablet 4 mg  4 mg Oral Q6H PRN Doutova, Anastassia, MD       Or  . ondansetron (ZOFRAN) injection 4 mg  4 mg Intravenous Q6H PRN Doutova, Anastassia, MD      . potassium chloride SA (K-DUR,KLOR-CON) CR tablet 40 mEq  40 mEq Oral BID Domenic Polite, MD   40 mEq at 11/03/17 1100  . rosuvastatin (CRESTOR) tablet 5 mg  5 mg Oral QHS Doutova, Anastassia, MD   5 mg at 11/03/17 0025    Allergies as of 11/02/2017 - Review Complete  11/02/2017  Allergen Reaction Noted  . Dilaudid [hydromorphone hcl] Other (See Comments) 01/21/2011  . Aspirin  10/27/2017  . Losartan Other (See Comments) 11/02/2017  . Bactrim [sulfamethoxazole-trimethoprim] Rash 11/02/2017  . Sulfur Rash 10/14/2010     Review of Systems:    Constitutional: No weight loss, fever or chills Skin: No rash  Cardiovascular: No chest pain Respiratory: +DOE Gastrointestinal: See HPI and otherwise negative Genitourinary: No dysuria Neurological: +positional dizziness Musculoskeletal: No new muscle or joint pain Hematologic: No bleeding Psychiatric: No history of depression or anxiety    Physical Exam:  Vital signs in last 24 hours: Temp:  [97.4 F (36.3 C)-98.7 F (37.1 C)] 98.1 F (36.7 C) (08/08 0729) Pulse Rate:  [82-125] 111 (08/08 1045) Resp:  [16-22] 20 (08/08 0729) BP: (103-157)/(53-100) 139/93 (08/08 1045) SpO2:  [95 %-100 %] 97 % (08/08 0729) Weight:  [93.9 kg] 93.9 kg (08/07 1615) Last BM Date: 11/02/17 General:   Pleasant Elderly Caucasian female appears to be in NAD, Well developed, Well nourished, alert and cooperative Head:  Normocephalic and atraumatic. Eyes:   PEERL, EOMI. No icterus. Conjunctiva pink. Ears:  Normal auditory acuity. Neck:  Supple Throat: Oral cavity and pharynx without inflammation, swelling or lesion.  Lungs: Respirations even and unlabored. Lungs clear to auscultation bilaterally.   No wheezes, crackles, or rhonchi.  Heart: Normal S1, S2. No MRG. Regular rate and rhythm. No peripheral edema, cyanosis or pallor.  Abdomen:  Soft, nondistended, nontender. No rebound or guarding. Normal bowel sounds. No appreciable masses or hepatomegaly. Rectal:  Not performed.  Msk:  Symmetrical without gross deformities. Peripheral pulses intact.  Extremities:  Without edema, no deformity or joint abnormality.  Neurologic:  Alert and  oriented x4;  grossly normal neurologically.  Skin:   Dry and intact without significant  lesions or rashes. Psychiatric: Demonstrates good judgement and reason without abnormal affect  or behaviors.  LAB RESULTS: Recent Labs    11/02/17 1633 11/02/17 2327 11/03/17 0328  WBC 6.1 6.3 6.2  HGB 8.1* 9.1* 8.7*  HCT 27.9* 30.1* 29.1*  PLT 254 246 254   BMET Recent Labs    11/02/17 1633 11/03/17 0328  NA 138 138  K 3.6 3.6  CL 104 103  CO2 24 26  GLUCOSE 97 122*  BUN 18 16  CREATININE 0.98 0.88  CALCIUM 8.3* 8.2*   LFT Recent Labs    11/03/17 0328  PROT 5.6*  ALBUMIN 2.5*  AST 23  ALT 25  ALKPHOS 90  BILITOT 1.5*    STUDIES: Dg Chest 1 View  Result Date: 11/02/2017 CLINICAL DATA:  84 year old female with dizziness and anemia EXAM: CHEST  1 VIEW COMPARISON:  Prior chest x-ray 01/21/2015 FINDINGS: Stable cardiac and mediastinal contours. Low inspiratory volumes with chronic atelectasis versus scarring in the lingula. No focal airspace consolidation, pulmonary edema, large pleural effusion or pneumothorax. No acute osseous abnormality. Bilateral glenohumeral joint osteoarthritis. IMPRESSION: Stable chest x-ray without evidence of acute cardiopulmonary process. Electronically Signed   By: Jacqulynn Cadet M.D.   On: 11/02/2017 18:46   CT ABDOMEN AND PELVIS WITH CONTRAST  TECHNIQUE: Multidetector CT imaging of the abdomen and pelvis was performed using the standard protocol following bolus administration of intravenous contrast.  CONTRAST:  142mL OMNIPAQUE IOHEXOL 300 MG/ML  SOLN  COMPARISON:  12/04/2010  FINDINGS: Lower chest: The heart size is enlarged. Calcifications within the thoracic aorta, lad coronary artery noted. No acute findings identified.  Hepatobiliary: Stable partially exophytic lesion within segment 2 of the liver measures 2.2 cm, image 21/3. Also unchanged is a focal area of low attenuation within the posterior aspect of segment 8 measuring 1 cm, image 21/3. There is a low-density structure within segment 5 adjacent to the  gallbladder fossa measuring 1.6 cm. Not seen on previous exam. This is indeterminate. Previous cholecystectomy. No significant biliary dilatation.  Pancreas: Unremarkable. No pancreatic ductal dilatation or surrounding inflammatory changes.  Spleen: Normal in size without focal abnormality.  Adrenals/Urinary Tract: Normal adrenal glands.  Bilateral renal cortical scarring and lobulation is identified and appears similar to the previous exam. No kidney mass or hydronephrosis identified.  Stomach/Bowel: The stomach appears normal. The small bowel loops are unremarkable. The appendix is visualized and appears normal. There is extensive sigmoid diverticulosis identified. Mild wall thickening involving the sigmoid colon is identified which may reflect low-grade, uncomplicated sigmoid diverticulitis. No perforation or abscess identified.  Vascular/Lymphatic: Aortic atherosclerosis. No aneurysm. No abdominal or pelvic adenopathy.  Reproductive: Status post hysterectomy. No adnexal masses.  Other: No free fluid or fluid collections identified.  Musculoskeletal: Mild lumbar scoliosis and multi level spondylosis.  IMPRESSION: 1. There is extensive sigmoid diverticulosis with mild wall thickening suspicious for uncomplicated acute diverticulitis. No perforation or abscess formation identified. 2. There is a new low-attenuation structure within segment 5 of the liver adjacent to the gallbladder fossa, favor focal fatty deposition. Suggest followup imaging in 3-6 months to ensure stability. At this time the study of choice would be a contrast enhanced liver protocol MRI. The other low-density liver lesions are unchanged from 12/04/2010 and are likely to represent benign abnormalities. 3.  Aortic Atherosclerosis (ICD10-I70.0). 4. Coronary artery atherosclerotic calcifications noted.   Electronically Signed   By: Kerby Moors M.D.   On: 10/27/2017 13:01   Impression /  Plan:   Impression: 1.  Acute on chronic anemia: Hemoglobin 12.8 (01/21/2015)--> 8.4 (10/27/2017)--> 8.1 (11/02/2017)--> 1  unit PRBCs--> 9.1--> 8.7; etc. GI source of blood loss 2.  GERD: Controlled with PPI 3.  A. fib with RVR: Patient currently on Cardizem drip cardiology is following- on Xarelto, last dose 8/6-pm 4.  GERD 5.  History of diverticulitis: Diagnosed 10/27/2017 patient started on Cipro and Flagyl pain is improved, CT as above 10/27/2017 with extensive sigmoid diverticulosis with mild wall thickening suspicious for uncomplicated acute diverticulitis  Plan: 1.  Agree with monitoring hemoglobin every 8 with transfusion as needed less than 7 2.  Continue supportive measures 3.  At this time patient will undergo EGD tomorrow with Dr. Lyndel Safe.  Did discuss risk, benefits, limitations and alternatives and patient agrees to proceed. 4.  Patient may need to undergo colonoscopy in the future but this is contraindicated at the moment given recent episode of diverticulitis for which the patient is still on antibiotics.  Recommendations are to wait 4-6 weeks after finishing antibiotics to proceed with colonoscopy. 5.  Patient may be on heart healthy diet today and n.p.o. after midnight. 6. Continue to hold xarelto 7.  Please await any further recommendations from Dr. Lyndel Safe later today.  Thank you for your kind consultation, we will continue to follow.  Lavone Nian Lemmon  11/03/2017, 11:14 AM   Attending physician's note   I have taken an interval history, reviewed the chart and examined the patient. I agree with the Advanced Practitioner's note, impression and recommendations.   84 year old with DM, CAD, A Fib on Xeralto, DCHF with acute on chronic anemia. No overt GI bleeding. Has diverticulitis on CT on Cipro and Flagyl. Last colonoscopy 05/2014: 1 cm ascending colon sessile tubular adenoma, diverticulosis. Last dose of Xeralto 8/6. Plan: EGD in AM. Trend CBC and transfuse as needed. Colonoscopy  in 4-6 weeks after finishing antibiotics for acute diverticulitis. CT reviewed  Carmell Austria, MD

## 2017-11-03 NOTE — Progress Notes (Signed)
Pt states she has no history of DM2. Says her primary MD looked into it years ago and told her she did not have DM2.   [place]  Ricka Westra, RN

## 2017-11-03 NOTE — Evaluation (Signed)
Physical Therapy Evaluation/Discharge  Patient Details Name: [name] MRN: [id number] DOB: 15-Jun-1933 Today's Date: 11/03/2017   History of Present Illness  84 y.o. female admitted with symptomatic anemia, dizziness, SOb and diverticulitis. PMHx: A.fib, CAD, HTn, DM 2, HLD, Diastolic CHF, CKD, GERD, mitral regurgitation  Clinical Impression  Pt reported having moved to assisted living facility one month prior which included an increase in physical activity (long hallway of 100 ft to dining room) done 3x a day. Has noticed feeling faint at the end of walk. Able to ambulate 150 ft today with one standing break at 90 ft and denied any SOB or dizziness at completion. Pt HR 122-151 with intermittent A-fib, SpO2 95-100. Pt denies any tightness or pain in chest or SOB. Pt is at baseline and does not require any further physical therapy. Pt aware of and agreeable to no further needs, encouraged to continue daily ambulation.        Follow Up Recommendations No PT follow up    Equipment Recommendations  None recommended by PT    Recommendations for Other Services       Precautions / Restrictions Precautions Precautions: Fall Restrictions Weight Bearing Restrictions: No      Mobility  Bed Mobility Overal bed mobility: Modified Independent             General bed mobility comments: use of rail to get EOB, HOB elevated 45 degrees   Transfers Overall transfer level: Modified independent Equipment used: 4-wheeled walker             General transfer comment: sit to stand from 6" elevated bed  Ambulation/Gait Ambulation/Gait assistance: Supervision(supervision for lines ) Gait Distance (Feet): 150 Feet Assistive device: 4-wheeled walker Gait Pattern/deviations: Step-through pattern;Decreased stride length Gait velocity: decreased  Gait velocity interpretation: 1.31 - 2.62 ft/sec, indicative of limited community ambulator General Gait Details: use of rollator, required  one standing break at 90 ft, reported this is baseline, denied use of 2-wheel walker and socks. wore slip on shoes  Stairs            Wheelchair Mobility    Modified Rankin (Stroke Patients Only)       Balance Overall balance assessment: Needs assistance Sitting-balance support: No upper extremity supported Sitting balance-Leahy Scale: Good     Standing balance support: Bilateral upper extremity supported Standing balance-Leahy Scale: Poor                               Pertinent Vitals/Pain Pain Assessment: No/denies pain(at rest in bed, says usually in pain with activity from OA )    Home Living Family/patient expects to be discharged to:: Assisted living               Home Equipment: Other (comment)(rollator) Additional Comments: hc toilet     Prior Function Level of Independence: Independent with assistive device(s)         Comments: modified independent with ADLs, uses sock assist, uses rollator, walks 100 ft at baseline     Hand Dominance        Extremity/Trunk Assessment   Upper Extremity Assessment Upper Extremity Assessment: Overall WFL for tasks assessed    Lower Extremity Assessment Lower Extremity Assessment: Overall WFL for tasks assessed       Communication   Communication: No difficulties  Cognition Arousal/Alertness: Awake/alert Behavior During Therapy: WFL for tasks assessed/performed Overall Cognitive Status: Within Functional Limits for tasks assessed  General Comments General comments (skin integrity, edema, etc.): use of rollator to steady, bil UE support     Exercises     Assessment/Plan    PT Assessment Patent does not need any further PT services  PT Problem List         PT Treatment Interventions      PT Goals (Current goals can be found in the Care Plan section)  Acute Rehab PT Goals PT Goal Formulation: All assessment and education  complete, DC therapy    Frequency     Barriers to discharge        Co-evaluation               AM-PAC PT "6 Clicks" Daily Activity  Outcome Measure Difficulty turning over in bed (including adjusting bedclothes, sheets and blankets)?: A Little Difficulty moving from lying on back to sitting on the side of the bed? : A Little Difficulty sitting down on and standing up from a chair with arms (e.g., wheelchair, bedside commode, etc,.)?: A Little Help needed moving to and from a bed to chair (including a wheelchair)?: A Little Help needed walking in hospital room?: A Little Help needed climbing 3-5 steps with a railing? : Total 6 Click Score: 16    End of Session   Activity Tolerance: Patient tolerated treatment well Patient left: in chair;with call bell/phone within reach;Other (comment)(no alarm box available in room )   PT Visit Diagnosis: Other abnormalities of gait and mobility (R26.89)    Time: 6270-3500 PT Time Calculation (min) (ACUTE ONLY): 27 min   Charges:   PT Evaluation $PT Eval Moderate Complexity: [place], [place]  Acute Rehab [phone]   Samuella Bruin 11/03/2017, 9:39 AM

## 2017-11-04 ENCOUNTER — Encounter (HOSPITAL_COMMUNITY)
Admission: EM | Disposition: A | Discharge status: Home Health | Payer: Self-pay | Source: Home / Self Care | Attending: Internal Medicine

## 2017-11-04 ENCOUNTER — Inpatient Hospital Stay (HOSPITAL_COMMUNITY): Payer: Medicare Other | Admitting: Anesthesiology

## 2017-11-04 ENCOUNTER — Encounter (HOSPITAL_COMMUNITY): Payer: Self-pay | Admitting: *Deleted

## 2017-11-04 DIAGNOSIS — K3189 Other diseases of stomach and duodenum: Secondary | ICD-10-CM

## 2017-11-04 HISTORY — PX: ESOPHAGOGASTRODUODENOSCOPY (EGD) WITH PROPOFOL: SHX5813

## 2017-11-04 HISTORY — PX: BIOPSY: SHX5522

## 2017-11-04 LAB — CBC
HCT: 29.3 % — ABNORMAL LOW (ref 36.0–46.0)
HEMOGLOBIN: 8.7 g/dL — AB (ref 12.0–15.0)
MCH: 23.8 pg — ABNORMAL LOW (ref 26.0–34.0)
MCHC: 29.7 g/dL — ABNORMAL LOW (ref 30.0–36.0)
MCV: 80.1 fL (ref 78.0–100.0)
Platelets: 236 10*3/uL (ref 150–400)
RBC: 3.66 MIL/uL — ABNORMAL LOW (ref 3.87–5.11)
RDW: 16.8 % — ABNORMAL HIGH (ref 11.5–15.5)
WBC: 5.5 10*3/uL (ref 4.0–10.5)

## 2017-11-04 LAB — BASIC METABOLIC PANEL
Anion gap: 8 (ref 5–15)
BUN: 14 mg/dL (ref 8–23)
CHLORIDE: 104 mmol/L (ref 98–111)
CO2: 28 mmol/L (ref 22–32)
CREATININE: 0.94 mg/dL (ref 0.44–1.00)
Calcium: 8.5 mg/dL — ABNORMAL LOW (ref 8.9–10.3)
GFR calc Af Amer: >60 mL/min (ref >60)
GFR calc non Af Amer: 55 mL/min — ABNORMAL LOW (ref >60)
Glucose, Bld: 113 mg/dL — ABNORMAL HIGH (ref 70–99)
Potassium: 4.3 mmol/L (ref 3.5–5.1)
SODIUM: 140 mmol/L (ref 135–145)

## 2017-11-04 SURGERY — ESOPHAGOGASTRODUODENOSCOPY (EGD) WITH PROPOFOL
Anesthesia: Monitor Anesthesia Care

## 2017-11-04 MED ORDER — FUROSEMIDE 10 MG/ML IJ SOLN
40.0000 mg | Freq: Two times a day (BID) | INTRAMUSCULAR | Status: AC
  Administered 2017-11-04 – 2017-11-05 (×2): 40 mg via INTRAVENOUS
  Filled 2017-11-04 (×2): qty 4

## 2017-11-04 MED ORDER — PROPOFOL 10 MG/ML IV BOLUS
INTRAVENOUS | Status: DC | PRN
  Administered 2017-11-04 (×4): 20 mg via INTRAVENOUS

## 2017-11-04 MED ORDER — PANTOPRAZOLE SODIUM 40 MG PO TBEC
40.0000 mg | DELAYED_RELEASE_TABLET | Freq: Every day | ORAL | Status: DC
  Administered 2017-11-05 – 2017-11-11 (×7): 40 mg via ORAL
  Filled 2017-11-04 (×6): qty 1

## 2017-11-04 MED ORDER — POTASSIUM CHLORIDE CRYS ER 20 MEQ PO TBCR
40.0000 meq | EXTENDED_RELEASE_TABLET | Freq: Two times a day (BID) | ORAL | Status: AC
  Administered 2017-11-04 (×2): 40 meq via ORAL
  Filled 2017-11-04 (×2): qty 2

## 2017-11-04 MED ORDER — LIDOCAINE HCL (CARDIAC) PF 100 MG/5ML IV SOSY
PREFILLED_SYRINGE | INTRAVENOUS | Status: DC | PRN
  Administered 2017-11-04: 40 mg via INTRAVENOUS

## 2017-11-04 MED ORDER — FUROSEMIDE 10 MG/ML IJ SOLN: 20.0000 mg | Freq: Two times a day (BID) | INTRAMUSCULAR | Status: DC

## 2017-11-04 SURGICAL SUPPLY — 15 items

## 2017-11-04 NOTE — Op Note (Signed)
[facility] Patient Name: [name] Procedure Date : 11/04/2017 MRN: [id number] Attending MD: Jackquline Denmark , MD Date of Birth: May 27, 1933 CSN: [id number] Age: 84 Admit Type: Inpatient Procedure:                Upper GI endoscopy Indications:              84 year old with DM, CAD, A Fib on Xeralto, [facility]                            with acute on chronic anemia. Hb 8.1 on Adm. No                            overt GI bleeding. Has diverticulitis on CT on                            Cipro and Flagyl. Last colonoscopy 05/2014: 1 cm                            ascending colon sessile tubular adenoma,                            diverticulosis. Last dose of Xeralto 8/6. Providers:                Jackquline Denmark, MD, Zenon Mayo, RN, Alan Mulder,                            Technician Referring MD:              Medicines:                Monitored Anesthesia Care Complications:            No immediate complications. Estimated Blood Loss:     Estimated blood loss was minimal. Procedure:                Pre-Anesthesia Assessment:                           - Prior to the procedure, a History and Physical                            was performed, and patient medications and                            allergies were reviewed. The patient's tolerance of                            previous anesthesia was also reviewed. The risks                            and benefits of the procedure and the sedation                            options and risks were discussed with the patient.  All questions were answered, and informed consent                            was obtained. Prior Anticoagulants: The patient has                            taken Xarelto (rivaroxaban), last dose was 3 days                            prior to procedure. ASA Grade Assessment: III - A                            patient with severe systemic disease. After                            reviewing  the risks and benefits, the patient was                            deemed in satisfactory condition to undergo the                            procedure.                           After obtaining informed consent, the endoscope was                            passed under direct vision. Throughout the                            procedure, the patient's blood pressure, pulse, and                            oxygen saturations were monitored continuously. The                            GIF-H190 ([id number]) Olympus adult EGD was introduced                            through the mouth, and advanced to the second part                            of duodenum. The upper GI endoscopy was                            accomplished without difficulty. The patient                            tolerated the procedure well. Scope In: Scope Out: Findings:      A few, 4 to 6 mm non-bleeding erosions were found in the gastric body       and in the gastric antrum with moderate surrounding gastritis. There       were no stigmata of recent bleeding. Biopsies were taken with a  cold       forceps for histology. Estimated blood loss was minimal.      The exam was otherwise without abnormality. Impression:               - Non-bleeding erosive gastropathy. Biopsied.                           - The examination was otherwise normal. Recommendation:           - Return patient to hospital ward for ongoing care.                           - Resume previous diet.                           - resume Xeralto from 8/11 onwards.                           - Await pathology results.                           - Avoid ibuprofen, naproxen, or other non-steroidal                            anti-inflammatory drugs.                           - Use Protonix (pantoprazole) 40 mg PO daily.                           - Return to GI clinic in 4-6 weeks.                           - Will sign off for now.Pl call with any questions. Procedure  Code(s):        --- Professional ---                           [phone], Esophagogastroduodenoscopy, flexible,                            transoral; with biopsy, single or multiple Diagnosis Code(s):        --- Professional ---                           K31.89, Other diseases of stomach and duodenum                           D62, Acute posthemorrhagic anemia CPT copyright 2017 American Medical Association. All rights reserved. The codes documented in this report are preliminary and upon coder review may  be revised to meet current compliance requirements. Jackquline Denmark, MD 11/04/2017 11:52:20 AM This report has been signed electronically. Number of Addenda: 0

## 2017-11-04 NOTE — Progress Notes (Signed)
Patient had 1.87 second pause.  Asymptomatic.  VSS.

## 2017-11-04 NOTE — Transfer of Care (Signed)
Immediate Anesthesia Transfer of Care Note  [name]  Procedure(s) Performed: ESOPHAGOGASTRODUODENOSCOPY (EGD) WITH PROPOFOL (N/A ) BIOPSY  Patient Location: Endoscopy Unit  Anesthesia Type:MAC  Level of Consciousness: drowsy, patient cooperative and responds to stimulation  Airway & Oxygen Therapy: Patient Spontanous Breathing and Patient connected to nasal cannula oxygen  Post-op Assessment: Report given to RN and Post -op Vital signs reviewed and stable  Post vital signs: Reviewed and stable  Last Vitals:  Vitals Value Taken Time  BP    Temp    Pulse    Resp 17 11/04/2017 11:44 AM  SpO2    Vitals shown include unvalidated device data.  Last Pain:  Vitals:   11/04/17 0941  TempSrc: Oral  PainSc: 0-No pain         Complications: No apparent anesthesia complications

## 2017-11-04 NOTE — Interval H&P Note (Signed)
History and Physical Interval Note:  11/04/2017 10:53 AM  [name]  has presented today for surgery, with the diagnosis of Anemia  The various methods of treatment have been discussed with the patient and family. After consideration of risks, benefits and other options for treatment, the patient has consented to  Procedure(s): ESOPHAGOGASTRODUODENOSCOPY (EGD) WITH PROPOFOL (N/A) as a surgical intervention .  The patient's history has been reviewed, patient examined, no change in status, stable for surgery.  I have reviewed the patient's chart and labs.  Questions were answered to the patient's satisfaction.     Jackquline Denmark

## 2017-11-04 NOTE — Progress Notes (Signed)
PROGRESS NOTE    [name]  LKG:[id number] DOB: 1933/11/17 DOA: 11/02/2017 PCP: Jani Gravel, MD  Brief Narrative: 84 y/o Caucasian female with hypertension, hyperlipidemia, CAD s/p LAD PCI in 2012, HFpEF, borderline DM, severe degenerative disc disease, persistent Afib on Xarelto, admitted to the hospital on 11/02/2017 with fatigue and found be severely anemic to 8, from baseline of 11-12 years ago, requiring blood transfusion  Assessment & Plan:     Iron deficiency anemia -Likely subacute, suspect chronic intermittent low-grade bleed from full dose anticoagulation with Xarelto, History not suggestive of upper GI bleed, colonoscopy 3 years ago noted some benign polyps -Status post 1 unit PRBC, gastroenterology consulted, anticoagulation on hold, EGD today -Suspect some degree of hemodilution from volume overloaded state also contributing to anemia -Hb unchanged, monitor  Atrial fibrillation with RVR -on low-dose Cardizem drip at 5 mg per hour, Xarelto held -volume overloaded as well, transitioning to oral Cardizem once volume status better -continue home dose of metoprolol  Acute on chronic diastolic CHF -Worsened in the setting of anemia and A. Fib RVR -transfused, -Continue IV Lasix again today, 40 mg 2 doses  CAD -Stable, continue metoprolol and Crestor  Hypertension -stable, continue IV Lasix today  Recent mild diverticulitis -diagnosed by CT on 8/1, treated with Augmentin -continue same to complete ten-day course  DVT prophylaxis:SCDs Code Status: full code Family Communication:no family at bedside Disposition Plan: home pending workup  Consultants:   Gastroenterology Dr. Lyndel Safe  Cardiology Dr.Patwardhan   Procedures:   Antimicrobials:    Subjective: -feels a little better, breathing more comfortably  Objective: Vitals:   11/04/17 0738 11/04/17 0941 11/04/17 1145 11/04/17 1150  BP: (!) 141/67 (!) 171/106 122/70 133/76  Pulse: 88  (!) 109 (!) 109   Resp: (!) 21 17 (!) 22 20  Temp: 98 F (36.7 C) 97.9 F (36.6 C) 97.7 F (36.5 C)   TempSrc: Oral Oral Oral   SpO2: 96% 92% 96% 93%  Weight:  93.9 kg    Height:  5\' 3"  (1.6 m)      Intake/Output Summary (Last 24 hours) at 11/04/2017 1257 Last data filed at 11/04/2017 1138 Gross per 24 hour  Intake 1472.91 ml  Output 1250 ml  Net 222.91 ml   Filed Weights   11/02/17 1615 11/04/17 0941  Weight: 93.9 kg 93.9 kg    Examination:  Gen: Awake, Alert, Oriented X 3, in no distress HEENT: PERRLA, Neck supple, no JVD Lungs: decreased breath sounds at both bases CVS: RRR,No Gallops,Rubs or new Murmurs Abd: soft, Non tender, non distended, BS present Extremities: 2+ edema Skin: no new rashes Psychiatry: Judgement and insight appear normal. Mood & affect appropriate.     Data Reviewed:   CBC: Recent Labs  Lab 11/02/17 1633 11/02/17 2327 11/03/17 0328 11/04/17 0348  WBC 6.1 6.3 6.2 5.5  NEUTROABS  --  3.9  --   --   HGB 8.1* 9.1* 8.7* 8.7*  HCT 27.9* 30.1* 29.1* 29.3*  MCV 81.1 79.6 79.5 80.1  PLT 254 246 254 664   Basic Metabolic Panel: Recent Labs  Lab 11/02/17 1633 11/03/17 0328 11/04/17 0348  NA 138 138 140  K 3.6 3.6 4.3  CL 104 103 104  CO2 24 26 28   GLUCOSE 97 122* 113*  BUN 18 16 14   CREATININE 0.98 0.88 0.94  CALCIUM 8.3* 8.2* 8.5*  MG  --  1.8  --   PHOS  --  3.7  --    GFR: Estimated Creatinine  Clearance: 51.1 mL/min (by C-G formula based on SCr of 0.94 mg/dL). Liver Function Tests: Recent Labs  Lab 11/02/17 1633 11/03/17 0328  AST 26 23  ALT 28 25  ALKPHOS 97 90  BILITOT 1.0 1.5*  PROT 5.8* 5.6*  ALBUMIN 2.7* 2.5*   No results for input(s): LIPASE, AMYLASE in the last 168 hours. No results for input(s): AMMONIA in the last 168 hours. Coagulation Profile: No results for input(s): INR, PROTIME in the last 168 hours. Cardiac Enzymes: Recent Labs  Lab 11/02/17 2327 11/03/17 0328  TROPONINI <0.03 <0.03   BNP (last 3 results) No  results for input(s): PROBNP in the last 8760 hours. HbA1C: Recent Labs    11/03/17 0328  HGBA1C 6.1*   CBG: Recent Labs  Lab 11/03/17 0024 11/03/17 0329 11/03/17 0801 11/03/17 1223  GLUCAP 139* 118* 101* 113*   Lipid Profile: No results for input(s): CHOL, HDL, LDLCALC, TRIG, CHOLHDL, LDLDIRECT in the last 72 hours. Thyroid Function Tests: Recent Labs    11/03/17 0328  TSH 0.866   Anemia Panel: Recent Labs    11/02/17 2327  VITAMINB12 370  FOLATE 7.1  FERRITIN 15  TIBC 568*  IRON 122  RETICCTPCT 2.0   Urine analysis:    Component Value Date/Time   COLORURINE YELLOW 01/25/2015 2122   APPEARANCEUR CLEAR 01/25/2015 2122   LABSPEC 1.012 01/25/2015 2122   PHURINE 5.5 01/25/2015 2122   GLUCOSEU NEGATIVE 01/25/2015 2122   HGBUR NEGATIVE 01/25/2015 2122   BILIRUBINUR NEGATIVE 01/25/2015 2122   KETONESUR NEGATIVE 01/25/2015 2122   PROTEINUR NEGATIVE 01/25/2015 2122   UROBILINOGEN 0.2 01/25/2015 2122   NITRITE NEGATIVE 01/25/2015 2122   LEUKOCYTESUR NEGATIVE 01/25/2015 2122   Sepsis Labs: @LABRCNTIP (procalcitonin:4,lacticidven:4)  ) Recent Results (from the past 240 hour(s))  MRSA PCR Screening     Status: None   Collection Time: 11/02/17 11:24 PM  Result Value Ref Range Status   MRSA by PCR NEGATIVE NEGATIVE Final    Comment:        The GeneXpert MRSA Assay (FDA approved for NASAL specimens only), is one component of a comprehensive MRSA colonization surveillance program. It is not intended to diagnose MRSA infection nor to guide or monitor treatment for MRSA infections. Performed at [facility], [place] [address] [place], [place] [address]          Radiology Studies: Dg Chest 1 View  Result Date: 11/02/2017 CLINICAL DATA:  84 year old female with dizziness and anemia EXAM: CHEST  1 VIEW COMPARISON:  Prior chest x-ray 01/21/2015 FINDINGS: Stable cardiac and mediastinal contours. Low inspiratory volumes with chronic atelectasis versus  scarring in the lingula. No focal airspace consolidation, pulmonary edema, large pleural effusion or pneumothorax. No acute osseous abnormality. Bilateral glenohumeral joint osteoarthritis. IMPRESSION: Stable chest x-ray without evidence of acute cardiopulmonary process. Electronically Signed   By: Jacqulynn Cadet M.D.   On: 11/02/2017 18:46        Scheduled Meds: . dorzolamide  1 drop Left Eye BID  . latanoprost  1 drop Left Eye QHS  . metoprolol tartrate  25 mg Oral BID  . pantoprazole  40 mg Oral Q0600  . rosuvastatin  5 mg Oral QHS   Continuous Infusions: . diltiazem (CARDIZEM) infusion 5 mg/hr (11/04/17 1122)     LOS: 2 days    Time spent: 3min    Domenic Polite, MD [facility] Page via [email or web address], password [email or web address] After 7PM please contact night-coverage  11/04/2017, 12:57 PM

## 2017-11-04 NOTE — Anesthesia Preprocedure Evaluation (Signed)
Anesthesia Evaluation  Patient identified by MRN, date of birth, ID band Patient awake    Reviewed: Allergy & Precautions, NPO status , Patient's Chart, lab work & pertinent test results  History of Anesthesia Complications (+) PONV and history of anesthetic complications  Airway Mallampati: II  TM Distance: >3 FB Neck ROM: Full    Dental  (+) Teeth Intact, Dental Advisory Given   Pulmonary shortness of breath and with exertion,    breath sounds clear to auscultation       Cardiovascular hypertension, Pt. on medications + CAD and + Past MI  + dysrhythmias Atrial Fibrillation  Rhythm:Irregular  Echo 10/16 Study Conclusions  - Left ventricle: The cavity size was normal. There was mild   concentric hypertrophy. Systolic function was normal. The   estimated ejection fraction was in the range of 60% to 65%. Wall   motion was normal; there were no regional wall motion   abnormalities. The study is not technically sufficient to allow   evaluation of LV diastolic function. - Mitral valve: Calcified annulus. There was mild regurgitation. - Left atrium: The atrium was moderately dilated. - Atrial septum: No defect or patent foramen ovale was identified. - Pulmonary arteries: PA peak pressure: 34 mm Hg (S).  Impressions:  - The right ventricular systolic pressure was increased consistent   with mild pulmonary hypertension.   Neuro/Psych Anxiety Depression    GI/Hepatic GERD  ,  Endo/Other  diabetes  Renal/GU      Musculoskeletal  (+) Arthritis ,   Abdominal   Peds  Hematology  (+) anemia ,   Anesthesia Other Findings   Reproductive/Obstetrics                             Anesthesia Physical  Anesthesia Plan  ASA: III  Anesthesia Plan: MAC   Post-op Pain Management:    Induction: Intravenous  PONV Risk Score and Plan: 2 and Treatment may vary due to age or medical  condition  Airway Management Planned: Mask, Nasal Cannula and Natural Airway  Additional Equipment:   Intra-op Plan:   Post-operative Plan:   Informed Consent: I have reviewed the patients History and Physical, chart, labs and discussed the procedure including the risks, benefits and alternatives for the proposed anesthesia with the patient or authorized representative who has indicated his/her understanding and acceptance.     Plan Discussed with: CRNA and Anesthesiologist  Anesthesia Plan Comments:         Anesthesia Quick Evaluation

## 2017-11-04 NOTE — Anesthesia Procedure Notes (Signed)
Procedure Name: MAC Date/Time: 11/04/2017 11:29 AM Performed by: Raenette Rover, CRNA Pre-anesthesia Checklist: Patient identified, Emergency Drugs available, Suction available and Patient being monitored Patient Re-evaluated:Patient Re-evaluated prior to induction Oxygen Delivery Method: Nasal cannula

## 2017-11-05 LAB — CBC
HCT: 32.4 % — ABNORMAL LOW (ref 36.0–46.0)
HEMOGLOBIN: 9.6 g/dL — AB (ref 12.0–15.0)
MCH: 24.1 pg — AB (ref 26.0–34.0)
MCHC: 29.6 g/dL — ABNORMAL LOW (ref 30.0–36.0)
MCV: 81.2 fL (ref 78.0–100.0)
Platelets: 248 10*3/uL (ref 150–400)
RBC: 3.99 MIL/uL (ref 3.87–5.11)
RDW: 17.2 % — ABNORMAL HIGH (ref 11.5–15.5)
WBC: 6.7 10*3/uL (ref 4.0–10.5)

## 2017-11-05 LAB — BASIC METABOLIC PANEL
Anion gap: 8 (ref 5–15)
BUN: 15 mg/dL (ref 8–23)
CHLORIDE: 102 mmol/L (ref 98–111)
CO2: 28 mmol/L (ref 22–32)
Calcium: 8.3 mg/dL — ABNORMAL LOW (ref 8.9–10.3)
Creatinine, Ser: 1.08 mg/dL — ABNORMAL HIGH (ref 0.44–1.00)
GFR calc Af Amer: 54 mL/min — ABNORMAL LOW (ref >60)
GFR calc non Af Amer: 47 mL/min — ABNORMAL LOW (ref >60)
GLUCOSE: 95 mg/dL (ref 70–99)
POTASSIUM: 4.9 mmol/L (ref 3.5–5.1)
SODIUM: 138 mmol/L (ref 135–145)

## 2017-11-05 MED ORDER — POTASSIUM CHLORIDE CRYS ER 20 MEQ PO TBCR
40.0000 meq | EXTENDED_RELEASE_TABLET | Freq: Three times a day (TID) | ORAL | Status: AC
  Administered 2017-11-05 (×2): 40 meq via ORAL
  Filled 2017-11-05 (×2): qty 2

## 2017-11-05 MED ORDER — FUROSEMIDE 10 MG/ML IJ SOLN
40.0000 mg | Freq: Three times a day (TID) | INTRAMUSCULAR | Status: AC
  Administered 2017-11-05 (×2): 40 mg via INTRAVENOUS
  Filled 2017-11-05 (×2): qty 4

## 2017-11-05 MED ORDER — DILTIAZEM HCL ER COATED BEADS 120 MG PO CP24
120.0000 mg | ORAL_CAPSULE | Freq: Every day | ORAL | Status: DC
  Administered 2017-11-05 – 2017-11-06 (×2): 120 mg via ORAL
  Filled 2017-11-05 (×2): qty 1

## 2017-11-05 NOTE — Progress Notes (Signed)
Subjective:  No significant bleeding Has shortness of breath and leg swelling  Objective:  Vital Signs in the last 24 hours: Temp:  [97.5 F (36.4 C)-99.1 F (37.3 C)] 98.1 F (36.7 C) (08/10 1111) Pulse Rate:  [95-125] 100 (08/10 1111) Resp:  [15-21] 18 (08/10 1111) BP: (103-142)/(56-117) 103/56 (08/10 1111) SpO2:  [91 %-100 %] 100 % (08/10 1111)  Intake/Output from previous day: 08/09 0701 - 08/10 0700 In: 400 [I.V.:400] Out: 1600 [Urine:1600] Intake/Output from this shift: Total I/O In: -  Out: 200 [Urine:200]  Physical Exam: Nursing note and vitals reviewed. Constitutional: She is oriented to person, place, and time. She appears well-developed and well-nourished. No distress.  HENT:  Head: Normocephalic and atraumatic.  Neck: Normal range of motion. Neck supple. No JVD present.  Cardiovascular: Normal rate. An irregularly irregular rhythm present. Exam reveals no gallop.  No murmur heard. Respiratory: Bilateral rales GI: Soft. Bowel sounds are normal. There is no tenderness.  Musculoskeletal: Normal range of motion. She exhibits edema (2+ b/l).  Lymphadenopathy:    She has no cervical adenopathy.  Neurological: She is alert and oriented to person, place, and time. She has normal reflexes. No cranial nerve deficit.  Skin: Skin is warm and dry.  Psychiatric: She has a normal mood and affect.   Lab Results: Recent Labs    11/04/17 0348 11/05/17 0249  WBC 5.5 6.7  HGB 8.7* 9.6*  PLT 236 248   Recent Labs    11/04/17 0348 11/05/17 0249  NA 140 138  K 4.3 4.9  CL 104 102  CO2 28 28  GLUCOSE 113* 95  BUN 14 15  CREATININE 0.94 1.08*   Recent Labs    11/02/17 2327 11/03/17 0328  TROPONINI <0.03 <0.03   Hepatic Function Panel Recent Labs    11/03/17 0328  PROT 5.6*  ALBUMIN 2.5*  AST 23  ALT 25  ALKPHOS 90  BILITOT 1.5*   Cardiac studies: EKG 11/02/2017: Afib w/ventricular rate 102 bpm. RBBB  Echocardiogram 01/22/2015: Study  Conclusions  - Left ventricle: The cavity size was normal. There was mild concentric hypertrophy. Systolic function was normal. The estimated ejection fraction was in the range of 60% to 65%. Wall motion was normal; there were no regional wall motion abnormalities. The study is not technically sufficient to allow evaluation of LV diastolic function. - Mitral valve: Calcified annulus. There was mild regurgitation. - Left atrium: The atrium was moderately dilated. - Atrial septum: No defect or patent foramen ovale was identified. - Pulmonary arteries: PA peak pressure: 34 mm Hg (S).  Impressions:  - The right ventricular systolic pressure was increased consistent with mild pulmonary hypertension.  EGD 11/04/2017: A few, 4 to 6 mm non-bleeding erosions were found in the gastric body and in the gastric antrum withmoderate surrounding gastritis. There were no stigmata of recent bleeding. Biopsies were taken with a cold forceps for histology. Estimated blood loss was minimal.  Assessment/Recommendations:  84 y/o Caucasian female with hypertension, hyperlipidemia, CAD s/p LAD PCI in 2012, HFpEF, borderline DM, severe degenerative disc disease, persistent Afib on Xarelto, now admitted with symptomatic anemia  Persistent Afib: CHA2DS2VAsc score 5, annual stroke risk 7.2% RVR thorugh the hospital stay, likely driven by anemia. Better controlled today. Start PO diltiazem 120 mg and wean off IV diltiazem, as tolerated. Continue metoprolol 25 mg PO bid. Resume Xarelto from 08/11 onwards, as per GI recommendations.  Acute on chronic diastolic heart failure: Agree with aggressive diuresis, as you are doing. Will obtain  echocardiogram  Symptomatic anemia: Hb improving after PRBC transfusions  CAD:  Stable. Continue crestor.   Hypertension: Controlled.   LOS: 3 days    Karmello Abercrombie J Talayeh Bruinsma 11/05/2017, 12:24 PM  [place], MD [facility].  [place] Pager: [phone] Office: [phone] If no answer Cell [phone]

## 2017-11-05 NOTE — Progress Notes (Signed)
New orders received for evaluation. Patient has been previously evaluated 11/03/17 by physical therapy, determined to be mobilizing well with no further acute PT needs. Please see evaluation note from that date, encouraged continued mobility with nursing staff. No further evaluation needed at this time.  Alben Deeds, PT DPT

## 2017-11-05 NOTE — Progress Notes (Signed)
PROGRESS NOTE    [name]  ERX:[id number] DOB: [birth year] DOA: 11/02/2017 PCP: Jani Gravel, MD  Brief Narrative: 84 y/o Caucasian female with hypertension, hyperlipidemia, CAD s/p LAD PCI in 2012, HFpEF, borderline DM, severe degenerative disc disease, persistent Afib on Xarelto, admitted to the hospital on 11/02/2017 with fatigue and found be severely anemic to 8, from baseline of 11-12 years ago, requiring blood transfusion  Assessment & Plan:     Iron deficiency anemia -Likely subacute, suspect chronic intermittent low-grade bleed from full dose anticoagulation with Xarelto, colonoscopy 3 years ago noted some benign polyps, EGD 8/9 with small gastric erosions, no ulcers, ok to resume xarelto 8/11 -Status post 1 unit PRBC, gastroenterology input appreciated -Also some degree of hemodilution from volume overloaded state, hb improving with diuresis, monitor  Atrial fibrillation with RVR -HR suboptimal, increase Cardizem gtt to 10 or 15mg /hr -continue IV Lasix again, transition to oral Cardizem once volume status better -continue home dose of metoprolol  Acute on chronic diastolic CHF -Worsened in the setting of anemia and A. Fib RVR -transfused 1 unit PRBC -slight improvement with diuresis, still with 2plus edema,  -Continue IV Lasix again today, 40 mg 2 doses  CAD -Stable, continue metoprolol and Crestor  Hypertension -stable, continue IV Lasix today  Recent mild diverticulitis -diagnosed by CT on 8/1, treated with Augmentin -continue same to complete ten-day course, stop after todays dose  DVT prophylaxis:SCDs Code Status: full code Family Communication:no family at bedside Disposition Plan: back to ALF likley in 48hours  Consultants:   Gastroenterology Dr. Lyndel Safe  Cardiology Dr.Patwardhan   Procedures:   Antimicrobials:    Subjective: -HR elevated overnight -breathing a little better  Objective: Vitals:   11/04/17 2326 11/05/17 0255 11/05/17 0744  11/05/17 0932  BP: 112/63 117/76 (!) 142/117 123/65  Pulse: 95  99 (!) 125  Resp: 17  20   Temp: 99.1 F (37.3 C) 98.1 F (36.7 C) 97.7 F (36.5 C)   TempSrc: Axillary Oral Oral   SpO2: 97%  91%   Weight:      Height:        Intake/Output Summary (Last 24 hours) at 11/05/2017 1037 Last data filed at 11/05/2017 0601 Gross per 24 hour  Intake 400 ml  Output 1600 ml  Net -1200 ml   Filed Weights   11/02/17 1615 11/04/17 0941  Weight: 93.9 kg 93.9 kg    Examination:  Gen: Awake, Alert, Oriented X 3, no distress HEENT: PERRLA, Neck supple, + JVD Lungs: rare basilar crackles CVS: RRR,No Gallops,Rubs or new Murmurs Abd: soft, Non tender, non distended, BS present Extremities: 2plus edema Skin: no new rashes Psychiatry: Judgement and insight appear normal. Mood & affect appropriate.     Data Reviewed:   CBC: Recent Labs  Lab 11/02/17 1633 11/02/17 2327 11/03/17 0328 11/04/17 0348 11/05/17 0249  WBC 6.1 6.3 6.2 5.5 6.7  NEUTROABS  --  3.9  --   --   --   HGB 8.1* 9.1* 8.7* 8.7* 9.6*  HCT 27.9* 30.1* 29.1* 29.3* 32.4*  MCV 81.1 79.6 79.5 80.1 81.2  PLT 254 246 254 236 950   Basic Metabolic Panel: Recent Labs  Lab 11/02/17 1633 11/03/17 0328 11/04/17 0348 11/05/17 0249  NA 138 138 140 138  K 3.6 3.6 4.3 4.9  CL 104 103 104 102  CO2 24 26 28 28   GLUCOSE 97 122* 113* 95  BUN 18 16 14 15   CREATININE 0.98 0.88 0.94 1.08*  CALCIUM 8.3* 8.2*  8.5* 8.3*  MG  --  1.8  --   --   PHOS  --  3.7  --   --    GFR: Estimated Creatinine Clearance: 44.5 mL/min (A) (by C-G formula based on SCr of 1.08 mg/dL (H)). Liver Function Tests: Recent Labs  Lab 11/02/17 1633 11/03/17 0328  AST 26 23  ALT 28 25  ALKPHOS 97 90  BILITOT 1.0 1.5*  PROT 5.8* 5.6*  ALBUMIN 2.7* 2.5*   No results for input(s): LIPASE, AMYLASE in the last 168 hours. No results for input(s): AMMONIA in the last 168 hours. Coagulation Profile: No results for input(s): INR, PROTIME in the last  168 hours. Cardiac Enzymes: Recent Labs  Lab 11/02/17 2327 11/03/17 0328  TROPONINI <0.03 <0.03   BNP (last 3 results) No results for input(s): PROBNP in the last 8760 hours. HbA1C: Recent Labs    11/03/17 0328  HGBA1C 6.1*   CBG: Recent Labs  Lab 11/03/17 0024 11/03/17 0329 11/03/17 0801 11/03/17 1223  GLUCAP 139* 118* 101* 113*   Lipid Profile: No results for input(s): CHOL, HDL, LDLCALC, TRIG, CHOLHDL, LDLDIRECT in the last 72 hours. Thyroid Function Tests: Recent Labs    11/03/17 0328  TSH 0.866   Anemia Panel: Recent Labs    11/02/17 2327  VITAMINB12 370  FOLATE 7.1  FERRITIN 15  TIBC 568*  IRON 122  RETICCTPCT 2.0   Urine analysis:    Component Value Date/Time   COLORURINE YELLOW 01/25/2015 2122   APPEARANCEUR CLEAR 01/25/2015 2122   LABSPEC 1.012 01/25/2015 2122   PHURINE 5.5 01/25/2015 2122   GLUCOSEU NEGATIVE 01/25/2015 2122   HGBUR NEGATIVE 01/25/2015 2122   BILIRUBINUR NEGATIVE 01/25/2015 2122   KETONESUR NEGATIVE 01/25/2015 2122   PROTEINUR NEGATIVE 01/25/2015 2122   UROBILINOGEN 0.2 01/25/2015 2122   NITRITE NEGATIVE 01/25/2015 2122   LEUKOCYTESUR NEGATIVE 01/25/2015 2122   Sepsis Labs: @LABRCNTIP (procalcitonin:4,lacticidven:4)  ) Recent Results (from the past 240 hour(s))  MRSA PCR Screening     Status: None   Collection Time: 11/02/17 11:24 PM  Result Value Ref Range Status   MRSA by PCR NEGATIVE NEGATIVE Final    Comment:        The GeneXpert MRSA Assay (FDA approved for NASAL specimens only), is one component of a comprehensive MRSA colonization surveillance program. It is not intended to diagnose MRSA infection nor to guide or monitor treatment for MRSA infections. Performed at [facility], [place] [address], [place],  [address]          Radiology Studies: No results found.      Scheduled Meds: . dorzolamide  1 drop Left Eye BID  . furosemide  40 mg Intravenous Q8H  . latanoprost  1 drop Left  Eye QHS  . metoprolol tartrate  25 mg Oral BID  . pantoprazole  40 mg Oral Q0600  . potassium chloride  40 mEq Oral Q8H  . rosuvastatin  5 mg Oral QHS   Continuous Infusions: . diltiazem (CARDIZEM) infusion 15 mg/hr (11/05/17 0839)     LOS: 3 days    Time spent: 82min    Domenic Polite, MD [facility] Page via [email or web address], password [email or web address] After 7PM please contact night-coverage  11/05/2017, 10:37 AM

## 2017-11-06 ENCOUNTER — Inpatient Hospital Stay (HOSPITAL_COMMUNITY): Payer: Medicare Other

## 2017-11-06 ENCOUNTER — Other Ambulatory Visit (HOSPITAL_COMMUNITY): Payer: No Typology Code available for payment source

## 2017-11-06 LAB — CBC
HCT: 29.6 % — ABNORMAL LOW (ref 36.0–46.0)
Hemoglobin: 9 g/dL — ABNORMAL LOW (ref 12.0–15.0)
MCH: 24.3 pg — AB (ref 26.0–34.0)
MCHC: 30.4 g/dL (ref 30.0–36.0)
MCV: 79.8 fL (ref 78.0–100.0)
PLATELETS: 243 10*3/uL (ref 150–400)
RBC: 3.71 MIL/uL — ABNORMAL LOW (ref 3.87–5.11)
RDW: 17.2 % — ABNORMAL HIGH (ref 11.5–15.5)
WBC: 9.1 10*3/uL (ref 4.0–10.5)

## 2017-11-06 LAB — BASIC METABOLIC PANEL
ANION GAP: 9 (ref 5–15)
BUN: 23 mg/dL (ref 8–23)
CALCIUM: 8.5 mg/dL — AB (ref 8.9–10.3)
CO2: 30 mmol/L (ref 22–32)
Chloride: 99 mmol/L (ref 98–111)
Creatinine, Ser: 1.29 mg/dL — ABNORMAL HIGH (ref 0.44–1.00)
GFR calc Af Amer: 44 mL/min — ABNORMAL LOW (ref >60)
GFR, EST NON AFRICAN AMERICAN: 38 mL/min — AB (ref >60)
Glucose, Bld: 113 mg/dL — ABNORMAL HIGH (ref 70–99)
POTASSIUM: 4.6 mmol/L (ref 3.5–5.1)
SODIUM: 138 mmol/L (ref 135–145)

## 2017-11-06 LAB — TYPE AND SCREEN
ABO/RH(D): A POS
ANTIBODY SCREEN: NEGATIVE
UNIT DIVISION: 0
Unit division: 0
Unit division: 0

## 2017-11-06 LAB — BPAM RBC
BLOOD PRODUCT EXPIRATION DATE: 201908202359
BLOOD PRODUCT EXPIRATION DATE: 201908202359
Blood Product Expiration Date: 201908152359
ISSUE DATE / TIME: 201908071856
UNIT TYPE AND RH: 6200
UNIT TYPE AND RH: 6200
Unit Type and Rh: 6200

## 2017-11-06 LAB — BRAIN NATRIURETIC PEPTIDE: B NATRIURETIC PEPTIDE 5: 238.4 pg/mL — AB (ref 0.0–100.0)

## 2017-11-06 MED ORDER — RIVAROXABAN 20 MG PO TABS: 20.0000 mg | ORAL_TABLET | Freq: Every day | ORAL | Status: DC

## 2017-11-06 MED ORDER — SODIUM CHLORIDE 0.9 % IV SOLN
Freq: Once | INTRAVENOUS | Status: AC
  Administered 2017-11-06: 12:00:00 via INTRAVENOUS

## 2017-11-06 MED ORDER — RIVAROXABAN 15 MG PO TABS
15.0000 mg | ORAL_TABLET | Freq: Every day | ORAL | Status: DC
  Administered 2017-11-06: 15 mg via ORAL
  Filled 2017-11-06: qty 1

## 2017-11-06 MED ORDER — FUROSEMIDE 10 MG/ML IJ SOLN
20.0000 mg | Freq: Two times a day (BID) | INTRAMUSCULAR | Status: DC
  Administered 2017-11-06 – 2017-11-07 (×3): 20 mg via INTRAVENOUS
  Filled 2017-11-06 (×4): qty 2

## 2017-11-06 MED ORDER — METOPROLOL TARTRATE 25 MG PO TABS
37.5000 mg | ORAL_TABLET | Freq: Two times a day (BID) | ORAL | Status: DC
  Administered 2017-11-06 – 2017-11-08 (×5): 37.5 mg via ORAL
  Filled 2017-11-06 (×5): qty 1

## 2017-11-06 NOTE — Progress Notes (Addendum)
Subjective:  No significant bleeding Has shortness of breath and leg swelling Afib w/RVR  Objective:  Vital Signs in the last 24 hours: Temp:  [98.1 F (36.7 C)-99.7 F (37.6 C)] 98.8 F (37.1 C) (08/11 0759) Pulse Rate:  [91-129] 91 (08/11 0759) Resp:  [16-22] 16 (08/11 0759) BP: (103-123)/(56-90) 103/57 (08/11 0759) SpO2:  [95 %-100 %] 95 % (08/11 0759)  Intake/Output from previous day: 08/10 0701 - 08/11 0700 In: 561.2 [P.O.:360; I.V.:201.2] Out: 200 [Urine:200] Intake/Output from this shift: No intake/output data recorded.  Physical Exam: Nursing note and vitals reviewed. Constitutional: She is oriented to person, place, and time. She appears well-developed and well-nourished. No distress.  HENT:  Head: Normocephalic and atraumatic.  Neck: Normal range of motion. Neck supple. No JVD present.  Cardiovascular: Normal rate.Irregularly irregular rhythm with tachycardia present. Exam reveals no gallop.  No murmur heard. Respiratory: Right sided rales GI: Soft. Bowel sounds are normal. There is no tenderness.  Musculoskeletal: Normal range of motion. She exhibits edema (2+ b/l).  Lymphadenopathy:    She has no cervical adenopathy.  Neurological: She is alert and oriented to person, place, and time. She has normal reflexes. No cranial nerve deficit.  Skin: Skin is warm and dry.  Psychiatric: She has a normal mood and affect.   Lab Results: Recent Labs    11/05/17 0249 11/06/17 0243  WBC 6.7 9.1  HGB 9.6* 9.0*  PLT 248 243   Recent Labs    11/05/17 0249 11/06/17 0243  NA 138 138  K 4.9 4.6  CL 102 99  CO2 28 30  GLUCOSE 95 113*  BUN 15 23  CREATININE 1.08* 1.29*   No results for input(s): TROPONINI in the last 72 hours.  Invalid input(s): CK, MB Hepatic Function Panel No results for input(s): PROT, ALBUMIN, AST, ALT, ALKPHOS, BILITOT, BILIDIR, IBILI in the last 72 hours. Cardiac studies: EKG 11/02/2017: Afib w/ventricular rate 102 bpm.  RBBB  Echocardiogram 01/22/2015: Study Conclusions  - Left ventricle: The cavity size was normal. There was mild concentric hypertrophy. Systolic function was normal. The estimated ejection fraction was in the range of 60% to 65%. Wall motion was normal; there were no regional wall motion abnormalities. The study is not technically sufficient to allow evaluation of LV diastolic function. - Mitral valve: Calcified annulus. There was mild regurgitation. - Left atrium: The atrium was moderately dilated. - Atrial septum: No defect or patent foramen ovale was identified. - Pulmonary arteries: PA peak pressure: 34 mm Hg (S).  Impressions:  - The right ventricular systolic pressure was increased consistent with mild pulmonary hypertension.  EGD 11/04/2017: A few, 4 to 6 mm non-bleeding erosions were found in the gastric body and in the gastric antrum withmoderate surrounding gastritis. There were no stigmata of recent bleeding. Biopsies were taken with a cold forceps for histology. Estimated blood loss was minimal.  Assessment/Recommendations:  84 y/o Caucasian female with hypertension, hyperlipidemia, CAD s/p LAD PCI in 2012, HFpEF, borderline DM, severe degenerative disc disease, persistent Afib on Xarelto, now admitted with symptomatic anemia  Paroxysmal Afib: Historically paroxysmal Afib CHA2DS2VAsc score 5, annual stroke risk 7.2% RVR thorugh the hospital stay, likely driven by anemia. Back on diltiazem ggt today. Continue PO diltiazem and metoprolol. Agree with the current dosing.  In the past, she has had QTc prolongation with amiodarone. With no recent echocardiogram, I would not want to start other antiarrthymic agents. Echocardiogram while in RVR would not be ideal. If rate not controlled by 08/12 morning,  will consider cardioversion. She has only been off Xarelto for two days (being resumed today). Risks of stroke are low compared to risks of bleeding given her  erosive gastritis. I have discussed the benefits/risks with the patient. Keep NPO after midnight tonight.  Acute on chronic diastolic heart failure: Agree with aggressive diuresis, as you are doing. Echoardiogram once rate better controlled.  While she has AKI, is is likely combination of Afib RVR and congestion. Recommend IV lasix at a lower dose, 20 mg bid.  Monitor Creatinine.  Symptomatic anemia: Hb improving after PRBC transfusions  AKI/CKD: As above  CAD:  Stable. Continue crestor.   Hypertension: Controlled.   LOS: 4 days    Amal Saiki J Earlin Sweeden 11/06/2017, 8:59 AM  [place], MD [facility] Pager: [phone] Office: [phone] If no answer Cell [phone]

## 2017-11-06 NOTE — Anesthesia Postprocedure Evaluation (Signed)
Anesthesia Post Note  [name]  Procedure(s) Performed: ESOPHAGOGASTRODUODENOSCOPY (EGD) WITH PROPOFOL (N/A ) BIOPSY     Patient location during evaluation: PACU Anesthesia Type: MAC Level of consciousness: awake and alert Pain management: pain level controlled Vital Signs Assessment: post-procedure vital signs reviewed and stable Respiratory status: spontaneous breathing, nonlabored ventilation, respiratory function stable and patient connected to nasal cannula oxygen Cardiovascular status: stable and blood pressure returned to baseline Postop Assessment: no apparent nausea or vomiting Anesthetic complications: no    Last Vitals:  Vitals:   11/06/17 1140 11/06/17 1220  BP: (!) 74/47 (!) 89/48  Pulse: 77 79  Resp: (!) 23 19  Temp:    SpO2: 95% 100%    Last Pain:  Vitals:   11/06/17 1110  TempSrc: Oral  PainSc:                  Shalon Salado

## 2017-11-06 NOTE — Progress Notes (Addendum)
Heart rate went down to 41'Z-53'M, BP systolic 10'A claimed she feels dizzy. Placed back to bed, MD aware. Cardizem gtt stopped. MD aware .  Ns 250 ml bolus given. Continue to monitor. '

## 2017-11-06 NOTE — Progress Notes (Signed)
  Echocardiogram 2D Echocardiogram has been performed.  [name] 11/06/2017, 4:09 PM

## 2017-11-06 NOTE — Progress Notes (Signed)
PROGRESS NOTE    [name]  OTL:[id number] DOB: 29-Aug-1933 DOA: 11/02/2017 PCP: Jani Gravel, MD  Brief Narrative: 84 y/o Caucasian female with hypertension, hyperlipidemia, CAD s/p LAD PCI in 2012, HFpEF, borderline DM, severe degenerative disc disease, persistent Afib on Xarelto, admitted to the hospital on 11/02/2017 with fatigue and found be severely anemic to 8, from baseline of 11-12 years ago, requiring blood transfusion  Assessment & Plan:     Iron deficiency anemia -Likely subacute, suspect chronic intermittent low-grade bleed from full dose anticoagulation with Xarelto, colonoscopy 3 years ago noted some benign polyps, EGD 8/9 with small gastric erosions, no ulcers, ok to resume xarelto 8/11 -Status post 1 unit PRBC, gastroenterology input appreciated -Also some degree of hemodilution from volume overloaded state, hb improving with diuresis, monitor  Atrial fibrillation with RVR -HR remains high, increase metoprolol to 37.5mg  BID, continue cardizem gtt and titrate up, may need Cardioversion, BP softer now but asymptomatic -restart Xarelto  Acute on chronic diastolic CHF -Worsened in the setting of anemia and A. Fib RVR -transfused 1 unit PRBC -no significant response to diuretics -Continue IV Lasix again today, 40 mg 2 doses  CAD -Stable, continue metoprolol and Crestor  Hypertension -stable, continue IV Lasix today  Recent mild diverticulitis -diagnosed by CT on 8/1, treated with Augmentin -continue same to complete ten-day course, stop after todays dose  DVT prophylaxis:SCDs Code Status: full code Family Communication:no family at bedside Disposition Plan: back to ALF when A. Fib RVR and respiratory status better  Consultants:   Gastroenterology Dr. Lyndel Safe  Cardiology Dr.Patwardhan   Procedures:   Antimicrobials:    Subjective: -continues to report dyspnea, heart rate remains elevated  Objective: Vitals:   11/05/17 2343 11/06/17 0503 11/06/17  0759 11/06/17 1110  BP: 121/90 103/75 (!) 103/57 (!) 85/49  Pulse: (!) 129 (!) 118 91 83  Resp: 18 20 16 18   Temp: 98.7 F (37.1 C) 99.7 F (37.6 C) 98.8 F (37.1 C) 98.1 F (36.7 C)  TempSrc: Oral Oral Oral Oral  SpO2: 96% 98% 95% 100%  Weight:      Height:        Intake/Output Summary (Last 24 hours) at 11/06/2017 1119 Last data filed at 11/06/2017 1000 Gross per 24 hour  Intake 481.15 ml  Output 200 ml  Net 281.15 ml   Filed Weights   11/02/17 1615 11/04/17 0941  Weight: 93.9 kg 93.9 kg    Examination:  Gen: Awake, Alert, Oriented X 3, elderly female, no distress HEENT: PERRLA, Neck supple,  Lungs: 5 basilar Rales right greater than left CVS: RRR,No Gallops,Rubs or new Murmurs Abd: soft, Non tender, non distended, BS present Extremities: 2+ edema Skin: no new rashes Psychiatry: Judgement and insight appear normal. Mood & affect appropriate.     Data Reviewed:   CBC: Recent Labs  Lab 11/02/17 2327 11/03/17 0328 11/04/17 0348 11/05/17 0249 11/06/17 0243  WBC 6.3 6.2 5.5 6.7 9.1  NEUTROABS 3.9  --   --   --   --   HGB 9.1* 8.7* 8.7* 9.6* 9.0*  HCT 30.1* 29.1* 29.3* 32.4* 29.6*  MCV 79.6 79.5 80.1 81.2 79.8  PLT 246 254 236 248 974   Basic Metabolic Panel: Recent Labs  Lab 11/02/17 1633 11/03/17 0328 11/04/17 0348 11/05/17 0249 11/06/17 0243  NA 138 138 140 138 138  K 3.6 3.6 4.3 4.9 4.6  CL 104 103 104 102 99  CO2 24 26 28 28 30   GLUCOSE 97 122* 113* 95  113*  BUN 18 16 14 15 23   CREATININE 0.98 0.88 0.94 1.08* 1.29*  CALCIUM 8.3* 8.2* 8.5* 8.3* 8.5*  MG  --  1.8  --   --   --   PHOS  --  3.7  --   --   --    GFR: Estimated Creatinine Clearance: 37.3 mL/min (A) (by C-G formula based on SCr of 1.29 mg/dL (H)). Liver Function Tests: Recent Labs  Lab 11/02/17 1633 11/03/17 0328  AST 26 23  ALT 28 25  ALKPHOS 97 90  BILITOT 1.0 1.5*  PROT 5.8* 5.6*  ALBUMIN 2.7* 2.5*   No results for input(s): LIPASE, AMYLASE in the last 168  hours. No results for input(s): AMMONIA in the last 168 hours. Coagulation Profile: No results for input(s): INR, PROTIME in the last 168 hours. Cardiac Enzymes: Recent Labs  Lab 11/02/17 2327 11/03/17 0328  TROPONINI <0.03 <0.03   BNP (last 3 results) No results for input(s): PROBNP in the last 8760 hours. HbA1C: No results for input(s): HGBA1C in the last 72 hours. CBG: Recent Labs  Lab 11/03/17 0024 11/03/17 0329 11/03/17 0801 11/03/17 1223  GLUCAP 139* 118* 101* 113*   Lipid Profile: No results for input(s): CHOL, HDL, LDLCALC, TRIG, CHOLHDL, LDLDIRECT in the last 72 hours. Thyroid Function Tests: No results for input(s): TSH, T4TOTAL, FREET4, T3FREE, THYROIDAB in the last 72 hours. Anemia Panel: No results for input(s): VITAMINB12, FOLATE, FERRITIN, TIBC, IRON, RETICCTPCT in the last 72 hours. Urine analysis:    Component Value Date/Time   COLORURINE YELLOW 01/25/2015 2122   APPEARANCEUR CLEAR 01/25/2015 2122   LABSPEC 1.012 01/25/2015 2122   PHURINE 5.5 01/25/2015 2122   GLUCOSEU NEGATIVE 01/25/2015 2122   HGBUR NEGATIVE 01/25/2015 2122   BILIRUBINUR NEGATIVE 01/25/2015 2122   KETONESUR NEGATIVE 01/25/2015 2122   PROTEINUR NEGATIVE 01/25/2015 2122   UROBILINOGEN 0.2 01/25/2015 2122   NITRITE NEGATIVE 01/25/2015 2122   LEUKOCYTESUR NEGATIVE 01/25/2015 2122   Sepsis Labs: @LABRCNTIP (procalcitonin:4,lacticidven:4)  ) Recent Results (from the past 240 hour(s))  MRSA PCR Screening     Status: None   Collection Time: 11/02/17 11:24 PM  Result Value Ref Range Status   MRSA by PCR NEGATIVE NEGATIVE Final    Comment:        The GeneXpert MRSA Assay (FDA approved for NASAL specimens only), is one component of a comprehensive MRSA colonization surveillance program. It is not intended to diagnose MRSA infection nor to guide or monitor treatment for MRSA infections. Performed at [facility], [place] [address], [place], [place] [address]           Radiology Studies: No results found.      Scheduled Meds: . diltiazem  120 mg Oral Daily  . dorzolamide  1 drop Left Eye BID  . furosemide  20 mg Intravenous BID  . latanoprost  1 drop Left Eye QHS  . metoprolol tartrate  37.5 mg Oral BID  . pantoprazole  40 mg Oral Q0600  . rivaroxaban  15 mg Oral Q supper  . rosuvastatin  5 mg Oral QHS   Continuous Infusions: . diltiazem (CARDIZEM) infusion 20 mg/hr (11/06/17 0900)     LOS: 4 days    Time spent: 20min    Domenic Polite, MD [facility] Page via [email or web address], password [email or web address] After 7PM please contact night-coverage  11/06/2017, 11:19 AM

## 2017-11-06 NOTE — Progress Notes (Signed)
Paged Cardiology on call about patient heart rate staying in 120s-130s at rest. Was taken off of IV cardizem on yesterday and began cardizem 120mg  po daily.   Dr. Virgina Jock called back, ordered to place her back on cardizem gtt. And he will come to see later today.

## 2017-11-06 NOTE — Progress Notes (Signed)
Transported down for echo by bed.

## 2017-11-07 ENCOUNTER — Encounter (HOSPITAL_COMMUNITY)
Admission: EM | Disposition: A | Discharge status: Home Health | Payer: Self-pay | Source: Home / Self Care | Attending: Internal Medicine

## 2017-11-07 LAB — CBC
HEMATOCRIT: 28.8 % — AB (ref 36.0–46.0)
Hemoglobin: 8.5 g/dL — ABNORMAL LOW (ref 12.0–15.0)
MCH: 23.8 pg — AB (ref 26.0–34.0)
MCHC: 29.5 g/dL — AB (ref 30.0–36.0)
MCV: 80.7 fL (ref 78.0–100.0)
Platelets: 257 10*3/uL (ref 150–400)
RBC: 3.57 MIL/uL — ABNORMAL LOW (ref 3.87–5.11)
RDW: 17.4 % — AB (ref 11.5–15.5)
WBC: 6.2 10*3/uL (ref 4.0–10.5)

## 2017-11-07 LAB — BASIC METABOLIC PANEL
Anion gap: 10 (ref 5–15)
BUN: 23 mg/dL (ref 8–23)
CALCIUM: 8.1 mg/dL — AB (ref 8.9–10.3)
CO2: 31 mmol/L (ref 22–32)
CREATININE: 1.17 mg/dL — AB (ref 0.44–1.00)
Chloride: 97 mmol/L — ABNORMAL LOW (ref 98–111)
GFR calc Af Amer: 49 mL/min — ABNORMAL LOW (ref >60)
GFR calc non Af Amer: 43 mL/min — ABNORMAL LOW (ref >60)
GLUCOSE: 108 mg/dL — AB (ref 70–99)
Potassium: 3.8 mmol/L (ref 3.5–5.1)
Sodium: 138 mmol/L (ref 135–145)

## 2017-11-07 LAB — ECHOCARDIOGRAM COMPLETE
HEIGHTINCHES: 63 in
WEIGHTICAEL: 3312.01 [oz_av]

## 2017-11-07 SURGERY — CARDIOVERSION
Anesthesia: General

## 2017-11-07 MED ORDER — DILTIAZEM HCL ER COATED BEADS 180 MG PO CP24
180.0000 mg | ORAL_CAPSULE | Freq: Every day | ORAL | Status: DC
  Administered 2017-11-07 – 2017-11-11 (×5): 180 mg via ORAL
  Filled 2017-11-07 (×5): qty 1

## 2017-11-07 MED ORDER — RIVAROXABAN 20 MG PO TABS
20.0000 mg | ORAL_TABLET | Freq: Every day | ORAL | Status: DC
  Administered 2017-11-07 – 2017-11-10 (×4): 20 mg via ORAL
  Filled 2017-11-07 (×4): qty 1

## 2017-11-07 NOTE — Progress Notes (Signed)
PROGRESS NOTE    [name]  OZH:[id number] DOB: [birth year] DOA: 11/02/2017 PCP: Jani Gravel, MD  Brief Narrative: 84 y/o Caucasian female with hypertension, hyperlipidemia, CAD s/p LAD PCI in 2012, HFpEF, borderline DM, severe degenerative disc disease, persistent Afib on Xarelto, admitted to the hospital on 11/02/2017 with fatigue and found be severely anemic to 8, from baseline of 11-12, 3years ago, requiring blood transfusion  Assessment & Plan:     Iron deficiency anemia -Likely subacute, suspect chronic intermittent low-grade bleed from full dose anticoagulation with Xarelto, colonoscopy 3 years ago noted some benign polyps, EGD 8/9 with small gastric erosions, no ulcers, ok to resume xarelto 8/11 per GI -Status post 1 unit PRBC, gastroenterology input appreciated -Also some degree of hemodilution from volume overloaded state -Hb remains stable, monitor  Atrial fibrillation with RVR -HR better controlled now, continue increased dose of metoprolol to 37.5mg  BID, and increase Cardizem CD dose -restarted Xarelto  Acute on chronic diastolic CHF -Worsened in the setting of anemia and A. Fib RVR -transfused 1 unit PRBC -volume status slightly better, IV Lasix one more day  CAD -Stable, continue metoprolol and Crestor  Hypertension -stable, continue IV Lasix today, BP better today  Recent mild diverticulitis -diagnosed by CT on 8/1, treated with Augmentin -stopped after 10day course  DVT prophylaxis:SCDs Code Status: full code Family Communication:no family at bedside Disposition Plan: back to ALF in 24-48hours  Consultants:   Gastroenterology Dr. Lyndel Safe  Cardiology Dr.Patwardhan   Procedures: EGD 8/9 with small gastric erosions, no ulcers,  Antimicrobials:    Subjective: Feels better today, HR improving, less dyspneic  Objective: Vitals:   11/07/17 0000 11/07/17 0306 11/07/17 0700 11/07/17 1020  BP: 108/63 (!) 115/59 113/78   Pulse: (!) 103 85 98 (!)  113  Resp: 15 (!) 23 18   Temp:  98.1 F (36.7 C) (!) 97.4 F (36.3 C)   TempSrc:  Oral Oral   SpO2: 97% 97% 96% 94%  Weight:      Height:        Intake/Output Summary (Last 24 hours) at 11/07/2017 1020 Last data filed at 11/07/2017 0700 Gross per 24 hour  Intake 970 ml  Output 1125 ml  Net -155 ml   Filed Weights   11/02/17 1615 11/04/17 0941  Weight: 93.9 kg 93.9 kg    Examination:  Gen: Awake, Alert, Oriented X 3, no distress HEENT: PERRLA, Neck supple, no JVD Lungs: fine basilar rales CVS: RRR,No Gallops,Rubs or new Murmurs Abd: soft, Non tender, non distended, BS present Extremities: 1plus edema Skin: no new rashes Psychiatry: Judgement and insight appear normal. Mood & affect appropriate.     Data Reviewed:   CBC: Recent Labs  Lab 11/02/17 2327 11/03/17 0328 11/04/17 0348 11/05/17 0249 11/06/17 0243 11/07/17 0206  WBC 6.3 6.2 5.5 6.7 9.1 6.2  NEUTROABS 3.9  --   --   --   --   --   HGB 9.1* 8.7* 8.7* 9.6* 9.0* 8.5*  HCT 30.1* 29.1* 29.3* 32.4* 29.6* 28.8*  MCV 79.6 79.5 80.1 81.2 79.8 80.7  PLT 246 254 236 248 243 629   Basic Metabolic Panel: Recent Labs  Lab 11/03/17 0328 11/04/17 0348 11/05/17 0249 11/06/17 0243 11/07/17 0206  NA 138 140 138 138 138  K 3.6 4.3 4.9 4.6 3.8  CL 103 104 102 99 97*  CO2 26 28 28 30 31   GLUCOSE 122* 113* 95 113* 108*  BUN 16 14 15 23 23   CREATININE 0.88 0.94  1.08* 1.29* 1.17*  CALCIUM 8.2* 8.5* 8.3* 8.5* 8.1*  MG 1.8  --   --   --   --   PHOS 3.7  --   --   --   --    GFR: Estimated Creatinine Clearance: 41.1 mL/min (A) (by C-G formula based on SCr of 1.17 mg/dL (H)). Liver Function Tests: Recent Labs  Lab 11/02/17 1633 11/03/17 0328  AST 26 23  ALT 28 25  ALKPHOS 97 90  BILITOT 1.0 1.5*  PROT 5.8* 5.6*  ALBUMIN 2.7* 2.5*   No results for input(s): LIPASE, AMYLASE in the last 168 hours. No results for input(s): AMMONIA in the last 168 hours. Coagulation Profile: No results for input(s): INR,  PROTIME in the last 168 hours. Cardiac Enzymes: Recent Labs  Lab 11/02/17 2327 11/03/17 0328  TROPONINI <0.03 <0.03   BNP (last 3 results) No results for input(s): PROBNP in the last 8760 hours. HbA1C: No results for input(s): HGBA1C in the last 72 hours. CBG: Recent Labs  Lab 11/03/17 0024 11/03/17 0329 11/03/17 0801 11/03/17 1223  GLUCAP 139* 118* 101* 113*   Lipid Profile: No results for input(s): CHOL, HDL, LDLCALC, TRIG, CHOLHDL, LDLDIRECT in the last 72 hours. Thyroid Function Tests: No results for input(s): TSH, T4TOTAL, FREET4, T3FREE, THYROIDAB in the last 72 hours. Anemia Panel: No results for input(s): VITAMINB12, FOLATE, FERRITIN, TIBC, IRON, RETICCTPCT in the last 72 hours. Urine analysis:    Component Value Date/Time   COLORURINE YELLOW 01/25/2015 2122   APPEARANCEUR CLEAR 01/25/2015 2122   LABSPEC 1.012 01/25/2015 2122   PHURINE 5.5 01/25/2015 2122   GLUCOSEU NEGATIVE 01/25/2015 2122   HGBUR NEGATIVE 01/25/2015 2122   BILIRUBINUR NEGATIVE 01/25/2015 2122   KETONESUR NEGATIVE 01/25/2015 2122   PROTEINUR NEGATIVE 01/25/2015 2122   UROBILINOGEN 0.2 01/25/2015 2122   NITRITE NEGATIVE 01/25/2015 2122   LEUKOCYTESUR NEGATIVE 01/25/2015 2122   Sepsis Labs: @LABRCNTIP (procalcitonin:4,lacticidven:4)  ) Recent Results (from the past 240 hour(s))  MRSA PCR Screening     Status: None   Collection Time: 11/02/17 11:24 PM  Result Value Ref Range Status   MRSA by PCR NEGATIVE NEGATIVE Final    Comment:        The GeneXpert MRSA Assay (FDA approved for NASAL specimens only), is one component of a comprehensive MRSA colonization surveillance program. It is not intended to diagnose MRSA infection nor to guide or monitor treatment for MRSA infections. Performed at [facility], [place] [address], [place], [place] [address]          Radiology Studies: No results found.      Scheduled Meds: . diltiazem  180 mg Oral Daily  . dorzolamide  1  drop Left Eye BID  . furosemide  20 mg Intravenous BID  . latanoprost  1 drop Left Eye QHS  . metoprolol tartrate  37.5 mg Oral BID  . pantoprazole  40 mg Oral Q0600  . rivaroxaban  20 mg Oral Q supper  . rosuvastatin  5 mg Oral QHS   Continuous Infusions:    LOS: 5 days    Time spent: 32min    Domenic Polite, MD [facility] Page via [email or web address], password [email or web address] After 7PM please contact night-coverage  11/07/2017, 10:20 AM

## 2017-11-07 NOTE — Progress Notes (Addendum)
Subjective:  No significant bleeding Rate better controlled  Objective:  Vital Signs in the last 24 hours: Temp:  [97.4 F (36.3 C)-98.6 F (37 C)] 97.4 F (36.3 C) (08/12 0700) Pulse Rate:  [75-121] 98 (08/12 0700) Resp:  [15-25] 18 (08/12 0700) BP: (74-130)/(47-83) 113/78 (08/12 0700) SpO2:  [93 %-100 %] 96 % (08/12 0700)  Intake/Output from previous day: 08/11 0701 - 08/12 0700 In: 1010 [P.O.:720; I.V.:290] Out: 1125 [Urine:1125] Intake/Output from this shift: No intake/output data recorded.  Physical Exam: Nursing note and vitals reviewed. Constitutional: She is oriented to person, place, and time. She appears well-developed and well-nourished. No distress.  HENT:  Head: Normocephalic and atraumatic.  Neck: Normal range of motion. Neck supple. No JVD present.  Cardiovascular: Normal rate.Irregularly irregular rhythm with tachycardia present. Exam reveals no gallop.  No murmur heard. Respiratory: Right sided rales GI: Soft. Bowel sounds are normal. There is no tenderness.  Musculoskeletal: Normal range of motion. She exhibits edema (1+ b/l).  Lymphadenopathy:    She has no cervical adenopathy.  Neurological: She is alert and oriented to person, place, and time. She has normal reflexes. No cranial nerve deficit.  Skin: Skin is warm and dry.  Psychiatric: She has a normal mood and affect.   Lab Results: Recent Labs    11/06/17 0243 11/07/17 0206  WBC 9.1 6.2  HGB 9.0* 8.5*  PLT 243 257   Recent Labs    11/06/17 0243 11/07/17 0206  NA 138 138  K 4.6 3.8  CL 99 97*  CO2 30 31  GLUCOSE 113* 108*  BUN 23 23  CREATININE 1.29* 1.17*    EKG 11/02/2017: Afib w/ventricular rate 102 bpm. RBBB  Echocardiogram 11/06/2017: Study Conclusions  - Left ventricle: The cavity size was normal. Systolic function was   normal. The estimated ejection fraction was in the range of 60%   to 65%. Unable to assess diastolic function due to Afib. Wall   motion was normal;  there were no regional wall motion   abnormalities. - Mitral valve: Moderately dilated annulus. There was moderate   regurgitation. - Left atrium: The atrium was severely dilated. - Right ventricle: Systolic function was mildly reduced. - Right atrium: The atrium was severely dilated. - Tricuspid valve: Mildly dilated annulus. There was mild-moderate   regurgitation. - Pulmonary arteries: PA peak pressure: 45 mm Hg (S).   Echocardiogram 01/22/2015: Study Conclusions  - Left ventricle: The cavity size was normal. There was mild concentric hypertrophy. Systolic function was normal. The estimated ejection fraction was in the range of 60% to 65%. Wall motion was normal; there were no regional wall motion abnormalities. The study is not technically sufficient to allow evaluation of LV diastolic function. - Mitral valve: Calcified annulus. There was mild regurgitation. - Left atrium: The atrium was moderately dilated. - Atrial septum: No defect or patent foramen ovale was identified. - Pulmonary arteries: PA peak pressure: 34 mm Hg (S).  Impressions:  - The right ventricular systolic pressure was increased consistent with mild pulmonary hypertension.  EGD 11/04/2017: A few, 4 to 6 mm non-bleeding erosions were found in the gastric body and in the gastric antrum withmoderate surrounding gastritis. There were no stigmata of recent bleeding. Biopsies were taken with a cold forceps for histology. Estimated blood loss was minimal.  Assessment/Recommendations:  84 y/o Caucasian female with hypertension, hyperlipidemia, CAD s/p LAD PCI in 2012, HFpEF, borderline DM, severe degenerative disc disease, persistent Afib on Xarelto, now admitted with symptomatic anemia  Paroxysmal  Afib: Historically paroxysmal Afib CHA2DS2VAsc score 5, annual stroke risk 7.2% Rate now well controlled on diltiazem 180 mg PO and metoprolol 37.5 mg PO bid. Continue rate control for now. Will  ensure tolerating anticoagulation and consider outpatient cardioversion. In the past, she has had QTc prolongation with amiodarone. Will consider alternate rhythm control therapy, as outpatient.   Acute on chronic diastolic heart failure: Improving. Recommend one more day of IV diuresis. Recommend torsemide 20 mg PO bid on discharge  Symptomatic anemia: Hb improving after PRBC transfusions  AKI/CKD: Improving  CAD:  Stable. Continue crestor.   Hypertension: Controlled.   LOS: 5 days    Kaseem Vastine J Mahin Guardia 11/07/2017, 8:35 AM  Nigel Mormon, MD [facility] Pager: [phone] Office: [phone] If no answer Cell [phone]

## 2017-11-07 NOTE — Progress Notes (Signed)
Physical Therapy Evaluation Patient Details Name: [name] MRN: [id number] DOB: [birth year] Today's Date: 11/07/2017   History of Present Illness  84 y.o. female admitted with symptomatic anemia, dizziness, SOb and diverticulitis. PMHx: A.fib, CAD, HTn, DM 2, HLD, Diastolic CHF, CKD, GERD, mitral regurgitation  Clinical Impression  Pt sitting in recliner on arrival with pt reporting having not eaten or had anything yet breakfast tray consumed in room. Pt with decreased transfers and inability to ambulate due to hypotension, dizziness and HA. Pt with decline in function from prior eval, has not ambulated out of room and has had difficulty with transitioning to a chair for the last several days. Pt lives alone in ALF and at this time does not demonstrate mobility to return to apartment alone yet and will require increased mobility acutely or transition to SNF.   Please see mobility section for BP details Pt on RA with SpO2 90-93%, HR 94-121    Follow Up Recommendations Home health PT;Supervision/Assistance - 24 hour, if unable to meet goals will require SNF    Equipment Recommendations  None recommended by PT    Recommendations for Other Services       Precautions / Restrictions Precautions Precautions: Fall Restrictions Weight Bearing Restrictions: No      Mobility  Bed Mobility               General bed mobility comments: in chair on arrival  Transfers Overall transfer level: Needs assistance               General transfer comment: sit to stand with guarding for safety as pt pulling up on rollator. Stand from rollator with BP 100/84. Pt performed 4 standing trials overall with report of HA or dizziness each trial limiting function  Ambulation/Gait Ambulation/Gait assistance: Min guard Gait Distance (Feet): 10 Feet Assistive device: 4-wheeled walker Gait Pattern/deviations: Step-through pattern;Decreased stride length   Gait velocity interpretation:  <1.8 ft/sec, indicate of risk for recurrent falls General Gait Details: pt walked from chair 6 feet and reproted dizziness with need to sit on rollator with bP 92/57 (68) even after 3 min sitting. Pt walked 10' to toilet with BP 84/43 (57) then insisted on return to chair walking 4' to recliner with  BP 91/61 (71)   Stairs            Wheelchair Mobility    Modified Rankin (Stroke Patients Only)       Balance Overall balance assessment: Needs assistance   Sitting balance-Leahy Scale: Good     Standing balance support: Bilateral upper extremity supported Standing balance-Leahy Scale: Poor                               Pertinent Vitals/Pain Pain Assessment: No/denies pain    Home Living Family/patient expects to be discharged to:: Private residence Living Arrangements: Alone   Type of Home: Apartment Home Access: Level entry     Home Layout: One level Home Equipment: Walker - 4 wheels      Prior Function Level of Independence: Independent with assistive device(s)         Comments: modified independent with ADLs, uses sock assist, uses rollator, walks 100 ft at baseline     Hand Dominance        Extremity/Trunk Assessment   Upper Extremity Assessment Upper Extremity Assessment: Generalized weakness    Lower Extremity Assessment Lower Extremity Assessment: Generalized weakness    Cervical /  Trunk Assessment Cervical / Trunk Assessment: Kyphotic  Communication   Communication: No difficulties  Cognition Arousal/Alertness: Awake/alert Behavior During Therapy: WFL for tasks assessed/performed Overall Cognitive Status: Within Functional Limits for tasks assessed                                        General Comments      Exercises     Assessment/Plan    PT Assessment Patient needs continued PT services  PT Problem List Decreased strength;Decreased mobility;Decreased activity tolerance;Decreased balance;Decreased  knowledge of use of DME;Cardiopulmonary status limiting activity;Decreased safety awareness       PT Treatment Interventions DME instruction;Therapeutic activities;Gait training;Therapeutic exercise;Patient/family education;Balance training;Functional mobility training    PT Goals (Current goals can be found in the Care Plan section)  Acute Rehab PT Goals Patient Stated Goal: return to [facility] PT Goal Formulation: With patient Time For Goal Achievement: 11/21/17 Potential to Achieve Goals: Fair    Frequency Min 3X/week   Barriers to discharge Decreased caregiver support      Co-evaluation               AM-PAC PT "6 Clicks" Daily Activity  Outcome Measure Difficulty turning over in bed (including adjusting bedclothes, sheets and blankets)?: A Little Difficulty moving from lying on back to sitting on the side of the bed? : A Little Difficulty sitting down on and standing up from a chair with arms (e.g., wheelchair, bedside commode, etc,.)?: A Little Help needed moving to and from a bed to chair (including a wheelchair)?: A Little Help needed walking in hospital room?: A Lot Help needed climbing 3-5 steps with a railing? : A Lot 6 Click Score: 16    End of Session Equipment Utilized During Treatment: Gait belt Activity Tolerance: Patient tolerated treatment well;Treatment limited secondary to medical complications (Comment) Patient left: in chair;with call bell/phone within reach;Other (comment)(chair alarm pad in place without box in room) Nurse Communication: Mobility status PT Visit Diagnosis: Unsteadiness on feet (R26.81);Muscle weakness (generalized) (M62.81);Other abnormalities of gait and mobility (R26.89)    Time: 5809-9833 PT Time Calculation (min) (ACUTE ONLY): 30 min   Charges:   PT Evaluation $PT Eval Moderate Complexity: 1 Mod PT Treatments $Therapeutic Activity: 8-22 mins        Elwyn Reach, PT [phone]   [name] 11/07/2017,  10:54 AM

## 2017-11-08 LAB — CBC
HEMATOCRIT: 30.1 % — AB (ref 36.0–46.0)
HEMOGLOBIN: 8.9 g/dL — AB (ref 12.0–15.0)
MCH: 23.7 pg — AB (ref 26.0–34.0)
MCHC: 29.6 g/dL — AB (ref 30.0–36.0)
MCV: 80.1 fL (ref 78.0–100.0)
Platelets: 250 10*3/uL (ref 150–400)
RBC: 3.76 MIL/uL — ABNORMAL LOW (ref 3.87–5.11)
RDW: 17.3 % — ABNORMAL HIGH (ref 11.5–15.5)
WBC: 5.4 10*3/uL (ref 4.0–10.5)

## 2017-11-08 LAB — BASIC METABOLIC PANEL
Anion gap: 10 (ref 5–15)
BUN: 21 mg/dL (ref 8–23)
CHLORIDE: 95 mmol/L — AB (ref 98–111)
CO2: 31 mmol/L (ref 22–32)
CREATININE: 1.22 mg/dL — AB (ref 0.44–1.00)
Calcium: 8.3 mg/dL — ABNORMAL LOW (ref 8.9–10.3)
GFR calc Af Amer: 47 mL/min — ABNORMAL LOW (ref >60)
GFR calc non Af Amer: 40 mL/min — ABNORMAL LOW (ref >60)
GLUCOSE: 112 mg/dL — AB (ref 70–99)
Potassium: 3.5 mmol/L (ref 3.5–5.1)
Sodium: 136 mmol/L (ref 135–145)

## 2017-11-08 MED ORDER — METOPROLOL TARTRATE 25 MG PO TABS
25.0000 mg | ORAL_TABLET | Freq: Two times a day (BID) | ORAL | Status: DC
  Administered 2017-11-08 – 2017-11-11 (×6): 25 mg via ORAL
  Filled 2017-11-08 (×6): qty 1

## 2017-11-08 MED ORDER — AMIODARONE HCL 200 MG PO TABS
200.0000 mg | ORAL_TABLET | Freq: Two times a day (BID) | ORAL | Status: DC
  Administered 2017-11-08 (×2): 200 mg via ORAL
  Filled 2017-11-08: qty 1

## 2017-11-08 MED ORDER — HYDROCORTISONE 1 % EX CREA
TOPICAL_CREAM | Freq: Three times a day (TID) | CUTANEOUS | Status: DC | PRN
  Administered 2017-11-08: 07:00:00 via TOPICAL
  Filled 2017-11-08: qty 28

## 2017-11-08 MED ORDER — POTASSIUM CHLORIDE CRYS ER 20 MEQ PO TBCR
40.0000 meq | EXTENDED_RELEASE_TABLET | Freq: Once | ORAL | Status: AC
  Administered 2017-11-08: 40 meq via ORAL
  Filled 2017-11-08: qty 2

## 2017-11-08 MED ORDER — HYDROCORTISONE 1 % EX CREA: TOPICAL_CREAM | Freq: Three times a day (TID) | CUTANEOUS | Status: DC

## 2017-11-08 MED ORDER — FUROSEMIDE 10 MG/ML IJ SOLN
40.0000 mg | Freq: Once | INTRAMUSCULAR | Status: AC
  Administered 2017-11-08: 40 mg via INTRAVENOUS
  Filled 2017-11-08: qty 4

## 2017-11-08 NOTE — Progress Notes (Signed)
Subjective:  Feels much better.  Denies dyspnea.  No palpitations or chest pain.  No bleeding in stool.  Objective:  Vital Signs in the last 24 hours: Temp:  [97.9 F (36.6 C)-99.2 F (37.3 C)] 98 F (36.7 C) (08/13 0730) Pulse Rate:  [83-118] 100 (08/13 0730) Resp:  [16-25] 18 (08/13 0730) BP: (114-130)/(61-92) 114/61 (08/13 0730) SpO2:  [91 %-97 %] 95 % (08/13 0730)  Intake/Output from previous day: 08/12 0701 - 08/13 0700 In: -  Out: 1350 [Urine:1350]  Physical Exam: Blood pressure 114/61, pulse 100, temperature 98 F (36.7 C), temperature source Oral, resp. rate 18, height 5\' 3"  (1.6 m), weight 93.9 kg, SpO2 95 %.  Body mass index is 36.67 kg/m. Physical Exam  Constitutional: She is oriented to person, place, and time.  Moderately built, moderately obese in no acute distress.  HENT:  Head: Atraumatic.  Eyes: Conjunctivae are normal.  Neck: Neck supple. No JVD present. No thyromegaly present.  Cardiovascular:  S1 is variable, S2 is normal.  Distant heart sounds.  No murmur or gallop appreciated.  Pulmonary/Chest: Effort normal and breath sounds normal.  Abdominal: Soft.  Obese, pannus present.  Musculoskeletal: She exhibits no edema (Large adipose tissue is present, without edema.).  Neurological: She is alert and oriented to person, place, and time.  Skin: Skin is warm and dry.  Psychiatric: She has a normal mood and affect.    Lab Results: BMP Recent Labs    11/06/17 0243 11/07/17 0206 11/08/17 0311  NA 138 138 136  K 4.6 3.8 3.5  CL 99 97* 95*  CO2 30 31 31   GLUCOSE 113* 108* 112*  BUN 23 23 21   CREATININE 1.29* 1.17* 1.22*  CALCIUM 8.5* 8.1* 8.3*  GFRNONAA 38* 43* 40*  GFRAA 44* 49* 47*    CBC Recent Labs  Lab 11/02/17 2327  11/08/17 0311  WBC 6.3   < > 5.4  RBC 3.78*  3.80*   < > 3.76*  HGB 9.1*   < > 8.9*  HCT 30.1*   < > 30.1*  PLT 246   < > 250  MCV 79.6   < > 80.1  MCH 24.1*   < > 23.7*  MCHC 30.2   < > 29.6*  RDW 16.4*   < >  17.3*  LYMPHSABS 1.7  --   --   MONOABS 0.6  --   --   EOSABS 0.1  --   --   BASOSABS 0.0  --   --    < > = values in this interval not displayed.    HEMOGLOBIN A1C Lab Results  Component Value Date   HGBA1C 6.1 (H) 11/03/2017   MPG 128.37 11/03/2017    Cardiac Panel (last 3 results) Recent Labs    11/02/17 2327 11/03/17 0328  TROPONINI <0.03 <0.03    TSH Recent Labs    11/03/17 0328  TSH 0.866    Lipid Panel     Component Value Date/Time   CHOL 118 09/24/2010 1000   TRIG 39 09/24/2010 1000   HDL 65 09/24/2010 1000   CHOLHDL 1.8 09/24/2010 1000   VLDL 8 09/24/2010 1000   LDLCALC 45 09/24/2010 1000     Hepatic Function Panel Recent Labs    10/27/17 1031 11/02/17 1633 11/03/17 0328  PROT 5.8* 5.8* 5.6*  ALBUMIN 2.9* 2.7* 2.5*  AST 18 26 23   ALT 20 28 25   ALKPHOS 89 97 90  BILITOT 1.0 1.0 1.5*   Cardiac Studies:  EKG 11/02/2017: Atrial fibrillation with rapid ventricular response at rate of 102 bpm, normal axis, right bundle branch block.  No evidence of ischemia, normal QT interval.   Echocardiogram 11/06/2017: Study Conclusions  - Left ventricle: The cavity size was normal. Systolic function was normal. The estimated ejection fraction was in the range of 60% to 65%. Unable to assess diastolic function due to Afib. Wall motion was normal; there were no regional wall motion abnormalities. - Mitral valve: Moderately dilated annulus. There was moderate regurgitation. - Left atrium: The atrium was severely dilated. - Right ventricle: Systolic function was mildly reduced. - Right atrium: The atrium was severely dilated. - Tricuspid valve: Mildly dilated annulus. There was mild-moderate regurgitation.  Pulmonary arteries: PA peak pressure: 45 mm Hg (S).  Assessment/Plan:  1.  Atrial fibrillation with rapid ventricular response CHA2DS2-VASCScore: Risk Score  5,  Yearly risk of stroke  >6. 2.  CAD S/P PCI to the LAD in 2012, no recurrence  of angina pectoris 3.  Acute on chronic diastolic heart failure precipitated by severe anemia secondary to GI bleed 4.  Diabetes mellitus type 2 controlled with stage III chronic kidney disease which has remained stable.  Blood sugars are well controlled. 5.  Hypertension  Recommendation: Patient still having A. fib with RVR, I am not too concerned about RVR with regard to increasing her physical activity.  As her blood pressure has been borderline low, will reduce her metoprolol back to 25 mg twice daily and start her on amiodarone 400 mg p.o. twice daily.  I would like her to be up on her feet, bathroom privileges to be given to the patient, encouraged to use walker.  Patient thinks that she will be able to do this and in fact she has "snuck out of the bed and walked around in the room".  Fall precautions is still necessary.    [name], M.D. 11/08/2017, 11:25 AM [facility] Pager: [phone] Office: [phone] If no answer: [phone]

## 2017-11-08 NOTE — Progress Notes (Signed)
Patient c/o back itching this am. Ordered and applied hydrocortisone cream after bath given. Linens changed as well. Patient state "much relief" after cream was applied. Small red, raised rash noted to medial and lower back.

## 2017-11-08 NOTE — Care Management Important Message (Signed)
Important Message  Patient Details  Name: [name] MRN: [id number] Date of Birth: May 26, 1933   Medicare Important Message Given:  Yes    [name] P [place] 11/08/2017, 2:13 PM

## 2017-11-08 NOTE — Progress Notes (Signed)
PROGRESS NOTE    [name]  UUV:[id number] DOB: December 14, 1933 DOA: 11/02/2017 PCP: Jani Gravel, MD  Brief Narrative: 84 y/o Caucasian female with hypertension, hyperlipidemia, CAD s/p LAD PCI in 4742, chronic diastolic CHF,, borderline DM, severe degenerative disc disease, persistent Afib on Xarelto, admitted to the hospital on 11/02/2017 with fatigue and found be severely anemic to 8, from baseline of 11-12, 3years ago, requiring blood transfusion. -recent colonoscopy was unremarkable, underwent EGD this admission which noted small gastric erosions, xarelto presumed, given 1 unit of PRBC -required diltiazem drip, metoprolol dose dose increased, heart rate improved but still suboptimal -Also undergoing diuresis with IV Lasix  Assessment & Plan:     Iron deficiency anemia -Likely subacute, suspect chronic intermittent low-grade bleed from full dose anticoagulation with Xarelto, colonoscopy 3 years ago noted some benign polyps, EGD 8/9 with small gastric erosions, no ulcers, ok to resume xarelto 8/11 per GI -Status post 1 unit PRBC, gastroenterology input appreciated, xarelto resumed -Also some degree of hemodilution from volume overloaded state -Hb remains stable, monitor  Atrial fibrillation with RVR -HR better controlled now, still suboptimal, soft blood pressure limits diuresis and rate control -continue increased dose of metoprolol to 37.5mg  BID, and increased Cardizem CD dose -restarted Xarelto -physical therapy evaluation  Acute on chronic diastolic CHF -Worsened in the setting of anemia and A. Fib RVR -transfused 1 unit PRBC -still volume overloaded, continue IV Lasix again today  CAD -Stable, continue metoprolol and Crestor  Hypertension -stable, continue IV Lasix today,  BP more stable today  Recent mild diverticulitis -diagnosed by CT on 8/1, treated with Augmentin -stopped after 10day course  DVT prophylaxis:SCDs Code Status: full code Family Communication:no  family at bedside Disposition Plan: back to ALF in 24-48hours  Consultants:   Gastroenterology Dr. Lyndel Safe  Cardiology Dr.Patwardhan   Procedures: EGD 8/9 with small gastric erosions, no ulcers,  Antimicrobials:    Subjective: -Breathing a little better, mild erythematous rash in the mid to lower back, patient reports having history of dermatitis, she is followed by a dermatologist and on low-dose steroid cream  Objective: Vitals:   11/07/17 2111 11/07/17 2300 11/08/17 0313 11/08/17 0730  BP: (!) 126/92 130/61 125/66 114/61  Pulse: (!) 118 97 83 100  Resp:  16 16 18   Temp:  97.9 F (36.6 C) 98.1 F (36.7 C) 98 F (36.7 C)  TempSrc:  Oral Oral Oral  SpO2:  95% 97% 95%  Weight:      Height:        Intake/Output Summary (Last 24 hours) at 11/08/2017 1023 Last data filed at 11/08/2017 0900 Gross per 24 hour  Intake -  Output 1850 ml  Net -1850 ml   Filed Weights   11/02/17 1615 11/04/17 0941  Weight: 93.9 kg 93.9 kg    Examination:  Gen: Awake, Alert, Oriented X 3, no distress HEENT: PERRLA, Neck supple, no JVD Lungs: fine basilar Rales, right greater than left CVS: RRR,No Gallops,Rubs or new Murmurs Abd: soft, Non tender, non distended, BS present Extremities: 2plus edema Skin: mild pinpoint erythematous rash on the lower back Psychiatry: Judgement and insight appear normal. Mood & affect appropriate.     Data Reviewed:   CBC: Recent Labs  Lab 11/02/17 2327  11/04/17 0348 11/05/17 0249 11/06/17 0243 11/07/17 0206 11/08/17 0311  WBC 6.3   < > 5.5 6.7 9.1 6.2 5.4  NEUTROABS 3.9  --   --   --   --   --   --  HGB 9.1*   < > 8.7* 9.6* 9.0* 8.5* 8.9*  HCT 30.1*   < > 29.3* 32.4* 29.6* 28.8* 30.1*  MCV 79.6   < > 80.1 81.2 79.8 80.7 80.1  PLT 246   < > 236 248 243 257 250   < > = values in this interval not displayed.   Basic Metabolic Panel: Recent Labs  Lab 11/03/17 0328 11/04/17 0348 11/05/17 0249 11/06/17 0243 11/07/17 0206 11/08/17 0311    NA 138 140 138 138 138 136  K 3.6 4.3 4.9 4.6 3.8 3.5  CL 103 104 102 99 97* 95*  CO2 26 28 28 30 31 31   GLUCOSE 122* 113* 95 113* 108* 112*  BUN 16 14 15 23 23 21   CREATININE 0.88 0.94 1.08* 1.29* 1.17* 1.22*  CALCIUM 8.2* 8.5* 8.3* 8.5* 8.1* 8.3*  MG 1.8  --   --   --   --   --   PHOS 3.7  --   --   --   --   --    GFR: Estimated Creatinine Clearance: 39.4 mL/min (A) (by C-G formula based on SCr of 1.22 mg/dL (H)). Liver Function Tests: Recent Labs  Lab 11/02/17 1633 11/03/17 0328  AST 26 23  ALT 28 25  ALKPHOS 97 90  BILITOT 1.0 1.5*  PROT 5.8* 5.6*  ALBUMIN 2.7* 2.5*   No results for input(s): LIPASE, AMYLASE in the last 168 hours. No results for input(s): AMMONIA in the last 168 hours. Coagulation Profile: No results for input(s): INR, PROTIME in the last 168 hours. Cardiac Enzymes: Recent Labs  Lab 11/02/17 2327 11/03/17 0328  TROPONINI <0.03 <0.03   BNP (last 3 results) No results for input(s): PROBNP in the last 8760 hours. HbA1C: No results for input(s): HGBA1C in the last 72 hours. CBG: Recent Labs  Lab 11/03/17 0024 11/03/17 0329 11/03/17 0801 11/03/17 1223  GLUCAP 139* 118* 101* 113*   Lipid Profile: No results for input(s): CHOL, HDL, LDLCALC, TRIG, CHOLHDL, LDLDIRECT in the last 72 hours. Thyroid Function Tests: No results for input(s): TSH, T4TOTAL, FREET4, T3FREE, THYROIDAB in the last 72 hours. Anemia Panel: No results for input(s): VITAMINB12, FOLATE, FERRITIN, TIBC, IRON, RETICCTPCT in the last 72 hours. Urine analysis:    Component Value Date/Time   COLORURINE YELLOW 01/25/2015 2122   APPEARANCEUR CLEAR 01/25/2015 2122   LABSPEC 1.012 01/25/2015 2122   PHURINE 5.5 01/25/2015 2122   GLUCOSEU NEGATIVE 01/25/2015 2122   HGBUR NEGATIVE 01/25/2015 2122   BILIRUBINUR NEGATIVE 01/25/2015 2122   KETONESUR NEGATIVE 01/25/2015 2122   PROTEINUR NEGATIVE 01/25/2015 2122   UROBILINOGEN 0.2 01/25/2015 2122   NITRITE NEGATIVE 01/25/2015 2122    LEUKOCYTESUR NEGATIVE 01/25/2015 2122   Sepsis Labs: @LABRCNTIP (procalcitonin:4,lacticidven:4)  ) Recent Results (from the past 240 hour(s))  MRSA PCR Screening     Status: None   Collection Time: 11/02/17 11:24 PM  Result Value Ref Range Status   MRSA by PCR NEGATIVE NEGATIVE Final    Comment:        The GeneXpert MRSA Assay (FDA approved for NASAL specimens only), is one component of a comprehensive MRSA colonization surveillance program. It is not intended to diagnose MRSA infection nor to guide or monitor treatment for MRSA infections. Performed at [facility], [place] [address] [place], [place] [address]          Radiology Studies: No results found.      Scheduled Meds: . diltiazem  180 mg Oral Daily  .  dorzolamide  1 drop Left Eye BID  . latanoprost  1 drop Left Eye QHS  . metoprolol tartrate  37.5 mg Oral BID  . pantoprazole  40 mg Oral Q0600  . rivaroxaban  20 mg Oral Q supper  . rosuvastatin  5 mg Oral QHS   Continuous Infusions:    LOS: 6 days    Time spent: 19min    Domenic Polite, MD [facility] Page via [email or web address], password [email or web address] After 7PM please contact night-coverage  11/08/2017, 10:23 AM

## 2017-11-08 NOTE — Progress Notes (Signed)
Pts son called and wanted to speak to case manager. I text paged case manager on call with sons number.

## 2017-11-09 ENCOUNTER — Encounter: Payer: Self-pay | Admitting: Gastroenterology

## 2017-11-09 LAB — BASIC METABOLIC PANEL
Anion gap: 8 (ref 5–15)
BUN: 20 mg/dL (ref 8–23)
CALCIUM: 8.7 mg/dL — AB (ref 8.9–10.3)
CO2: 33 mmol/L — AB (ref 22–32)
CREATININE: 1.08 mg/dL — AB (ref 0.44–1.00)
Chloride: 96 mmol/L — ABNORMAL LOW (ref 98–111)
GFR calc Af Amer: 54 mL/min — ABNORMAL LOW (ref >60)
GFR calc non Af Amer: 47 mL/min — ABNORMAL LOW (ref >60)
GLUCOSE: 116 mg/dL — AB (ref 70–99)
Potassium: 3.9 mmol/L (ref 3.5–5.1)
Sodium: 137 mmol/L (ref 135–145)

## 2017-11-09 LAB — CBC
HCT: 31 % — ABNORMAL LOW (ref 36.0–46.0)
Hemoglobin: 9.1 g/dL — ABNORMAL LOW (ref 12.0–15.0)
MCH: 23.2 pg — ABNORMAL LOW (ref 26.0–34.0)
MCHC: 29.4 g/dL — AB (ref 30.0–36.0)
MCV: 79.1 fL (ref 78.0–100.0)
PLATELETS: 272 10*3/uL (ref 150–400)
RBC: 3.92 MIL/uL (ref 3.87–5.11)
RDW: 17.4 % — AB (ref 11.5–15.5)
WBC: 5.5 10*3/uL (ref 4.0–10.5)

## 2017-11-09 MED ORDER — AMIODARONE HCL 200 MG PO TABS
400.0000 mg | ORAL_TABLET | Freq: Two times a day (BID) | ORAL | Status: DC
  Administered 2017-11-09 – 2017-11-11 (×5): 400 mg via ORAL
  Filled 2017-11-09 (×6): qty 2

## 2017-11-09 NOTE — Progress Notes (Signed)
PT Cancellation Note  Patient Details Name: [name] MRN: [id number] DOB: June 20, 1933   Cancelled Treatment:    Reason Eval/Treat Not Completed: Patient not medically ready. Pt going for cardioversion this afternoon. Pt's HR in chair from 120-150s while sitting. Acute PT to return 8/16 to progress ambulation. Spoke with patient regarding why PT wasn't going to see her today and that we would be back on 8/15 if appropriate.   Kittie Plater, PT, DPT Pager #: [phone] Office #: [phone]    Berline Lopes 11/09/2017, 8:49 AM

## 2017-11-09 NOTE — Progress Notes (Signed)
Pts back broke out in rash, pt states she is allergic to whatever the standard hosptial sheets are being washed in, and has seen a dermatologist about it in the past. States that she typically washes sheets in hypoallergenic detergent. Secretary to call and order some hypoallergenic sheets from [employer].

## 2017-11-09 NOTE — Progress Notes (Signed)
Patient Demographics:    [name], is a 84 y.o. female, DOB - Dec 10, 1933, YQM:[id number]  Admit date - 11/02/2017   Admitting Physician Toy Baker, MD  Outpatient Primary MD for the patient is Jani Gravel, MD  LOS - 7  Chief Complaint  Patient presents with  . Abnormal Lab  . Blood transfusion        Subjective:    [name] today has no fevers, no emesis,  No chest pain,  C/o some palpitations   Assessment  & Plan :    Active Problems:   Hypertension   Hyperlipidemia   GERD (gastroesophageal reflux disease)   Pulmonary hypertension (HCC)   Atrial fibrillation with RVR (HCC)   Symptomatic anemia  Brief Narrative: 84 y/o Caucasian female with hypertension, hyperlipidemia, CAD s/p LAD PCI in 5284, chronic diastolic CHF,, borderline DM, severe degenerative disc disease, persistent Afib on Xarelto, admitted to the hospital on 11/02/2017 with fatigue and found be severely anemic to 8, from baseline of 11-12, 3years ago, requiring blood transfusion. -recent colonoscopy was unremarkable, underwent EGD this admission which noted small gastric erosions, xarelto presumed, given 1 unit of PRBC -required diltiazem drip, metoprolol dose dose increased, heart rate improved but still suboptimal -Also undergoing diuresis with IV Lasix   Plan:-  1) Atrial fibrillation with rapid ventricular response--- appears to be failing rate control approaches, as per Dr Einar Gip the  cardiologist patient is for cardioversion on 11/09/2017, patient has been on Xarelto without interruptions,  CHA2DS2- VASc score   is = 7 (age 44, DM, HTN, CHF,  Gender)   Which is  equal to =  11.2 % annual risk of stroke  .  Continue amiodarone 400 mg twice daily, Cardizem CD 180 mg daily, 25 mg twice daily  2) CAD S/P PCI to the LAD in 2012, no recurrence of angina pectoris, continue Crestor 5 mg daily and metoprolol 25 mg  twice daily  3)HFpEF--  Acute on chronic diastolic heart failure precipitated by severe anemia secondary to GI bleed, overall transfusion and stabilization, last known EF 60 to 65%, may need further diuresis  4) Diabetes mellitus type 2 controlled -controlled, last A1c 6.1, Use Novolog/Humalog Sliding scale insulin with Accu-Cheks/Fingersticks as ordered   5)CKDII--- stable, most likely due to underlying hypertension and diabetes, avoid nephrotoxic agent  6)  Hypertension--stable, Cardizem and metoprolol as above  7) acute on chronic iron deficiency anemia, due to GI bleed while on anticoagulation, last colonoscopy about 3 years ago with removal of 2 polyps, EGD from 11/04/2017 with small gastric erosions, no ulcers or bleeding source of bleeding noted, as per GI service okay to resume Xarelto, which was resumed on 11/06/2017.  Improved after transfusion of 1 unit of packed cells   Code Status : full   Disposition Plan  : home  Consults  :  Cardiology/Gi   DVT Prophylaxis  :  Xarelto  Lab Results  Component Value Date   PLT 272 11/09/2017    Inpatient Medications  Scheduled Meds: . amiodarone  400 mg Oral BID  . diltiazem  180 mg Oral Daily  . dorzolamide  1 drop Left Eye BID  . latanoprost  1 drop Left Eye QHS  . metoprolol tartrate  25 mg  Oral BID  . pantoprazole  40 mg Oral Q0600  . rivaroxaban  20 mg Oral Q supper  . rosuvastatin  5 mg Oral QHS   Continuous Infusions: PRN Meds:.acetaminophen **OR** acetaminophen, HYDROcodone-acetaminophen, hydrocortisone cream, ondansetron **OR** ondansetron (ZOFRAN) IV    Anti-infectives (From admission, onward)   Start     Dose/Rate Route Frequency Ordered Stop   11/02/17 2359  amoxicillin-clavulanate (AUGMENTIN) 875-125 MG per tablet 1 tablet     1 tablet Oral 2 times daily 11/02/17 2324 11/03/17 2219        Objective:   Vitals:   11/09/17 0333 11/09/17 0756 11/09/17 1144 11/09/17 1554  BP: 130/73 109/90 114/90 115/68    Pulse: (!) 125 92 76 99  Resp: 15 20 20 18   Temp: 98.5 F (36.9 C) (!) 97.4 F (36.3 C) 97.8 F (36.6 C) 97.6 F (36.4 C)  TempSrc: Oral Oral Oral Oral  SpO2: 94% 95% 99% 99%  Weight:      Height:        Wt Readings from Last 3 Encounters:  11/04/17 93.9 kg  02/12/15 96.6 kg  01/25/15 97 kg     Intake/Output Summary (Last 24 hours) at 11/09/2017 1616 Last data filed at 11/09/2017 0334 Gross per 24 hour  Intake -  Output 500 ml  Net -500 ml     Physical Exam  Gen:- Awake Alert,  In no apparent distress  HEENT:- [place].AT, No sclera icterus Neck-Supple Neck,No JVD,.  Lungs-  CTAB , good air movement bilaterally CV- S1, S2 normal, irregularly irregular with heart rate in the 120s at rest Abd-  +ve B.Sounds, Abd Soft, No tenderness,    Extremity/Skin:- No  edema,   Good pulses Psych-affect is appropriate, oriented x3 Neuro-no new focal deficits, no tremors   Data Review:   Micro Results Recent Results (from the past 240 hour(s))  MRSA PCR Screening     Status: None   Collection Time: 11/02/17 11:24 PM  Result Value Ref Range Status   MRSA by PCR NEGATIVE NEGATIVE Final    Comment:        The GeneXpert MRSA Assay (FDA approved for NASAL specimens only), is one component of a comprehensive MRSA colonization surveillance program. It is not intended to diagnose MRSA infection nor to guide or monitor treatment for MRSA infections. Performed at [facility], [place] [address], [place], [place] [address]     Radiology Reports Dg Chest 1 View  Result Date: 11/02/2017 CLINICAL DATA:  84 year old female with dizziness and anemia EXAM: CHEST  1 VIEW COMPARISON:  Prior chest x-ray 01/21/2015 FINDINGS: Stable cardiac and mediastinal contours. Low inspiratory volumes with chronic atelectasis versus scarring in the lingula. No focal airspace consolidation, pulmonary edema, large pleural effusion or pneumothorax. No acute osseous abnormality. Bilateral glenohumeral joint  osteoarthritis. IMPRESSION: Stable chest x-ray without evidence of acute cardiopulmonary process. Electronically Signed   By: Jacqulynn Cadet M.D.   On: 11/02/2017 18:46   Ct Abdomen Pelvis W Contrast  Result Date: 10/27/2017 CLINICAL DATA:  Abdominal pain, diarrhea and weakness. EXAM: CT ABDOMEN AND PELVIS WITH CONTRAST TECHNIQUE: Multidetector CT imaging of the abdomen and pelvis was performed using the standard protocol following bolus administration of intravenous contrast. CONTRAST:  147mL OMNIPAQUE IOHEXOL 300 MG/ML  SOLN COMPARISON:  12/04/2010 FINDINGS: Lower chest: The heart size is enlarged. Calcifications within the thoracic aorta, lad coronary artery noted. No acute findings identified. Hepatobiliary: Stable partially exophytic lesion within segment 2 of the liver measures 2.2  cm, image 21/3. Also unchanged is a focal area of low attenuation within the posterior aspect of segment 8 measuring 1 cm, image 21/3. There is a low-density structure within segment 5 adjacent to the gallbladder fossa measuring 1.6 cm. Not seen on previous exam. This is indeterminate. Previous cholecystectomy. No significant biliary dilatation. Pancreas: Unremarkable. No pancreatic ductal dilatation or surrounding inflammatory changes. Spleen: Normal in size without focal abnormality. Adrenals/Urinary Tract: Normal adrenal glands. Bilateral renal cortical scarring and lobulation is identified and appears similar to the previous exam. No kidney mass or hydronephrosis identified. Stomach/Bowel: The stomach appears normal. The small bowel loops are unremarkable. The appendix is visualized and appears normal. There is extensive sigmoid diverticulosis identified. Mild wall thickening involving the sigmoid colon is identified which may reflect low-grade, uncomplicated sigmoid diverticulitis. No perforation or abscess identified. Vascular/Lymphatic: Aortic atherosclerosis. No aneurysm. No abdominal or pelvic adenopathy.  Reproductive: Status post hysterectomy. No adnexal masses. Other: No free fluid or fluid collections identified. Musculoskeletal: Mild lumbar scoliosis and multi level spondylosis. IMPRESSION: 1. There is extensive sigmoid diverticulosis with mild wall thickening suspicious for uncomplicated acute diverticulitis. No perforation or abscess formation identified. 2. There is a new low-attenuation structure within segment 5 of the liver adjacent to the gallbladder fossa, favor focal fatty deposition. Suggest followup imaging in 3-6 months to ensure stability. At this time the study of choice would be a contrast enhanced liver protocol MRI. The other low-density liver lesions are unchanged from 12/04/2010 and are likely to represent benign abnormalities. 3.  Aortic Atherosclerosis (ICD10-I70.0). 4. Coronary artery atherosclerotic calcifications noted. Electronically Signed   By: Kerby Moors M.D.   On: 10/27/2017 13:01     CBC Recent Labs  Lab 11/02/17 2327  11/05/17 0249 11/06/17 0243 11/07/17 0206 11/08/17 0311 11/09/17 0242  WBC 6.3   < > 6.7 9.1 6.2 5.4 5.5  HGB 9.1*   < > 9.6* 9.0* 8.5* 8.9* 9.1*  HCT 30.1*   < > 32.4* 29.6* 28.8* 30.1* 31.0*  PLT 246   < > 248 243 257 250 272  MCV 79.6   < > 81.2 79.8 80.7 80.1 79.1  MCH 24.1*   < > 24.1* 24.3* 23.8* 23.7* 23.2*  MCHC 30.2   < > 29.6* 30.4 29.5* 29.6* 29.4*  RDW 16.4*   < > 17.2* 17.2* 17.4* 17.3* 17.4*  LYMPHSABS 1.7  --   --   --   --   --   --   MONOABS 0.6  --   --   --   --   --   --   EOSABS 0.1  --   --   --   --   --   --   BASOSABS 0.0  --   --   --   --   --   --    < > = values in this interval not displayed.    Chemistries  Recent Labs  Lab 11/02/17 1633 11/03/17 0328  11/05/17 0249 11/06/17 0243 11/07/17 0206 11/08/17 0311 11/09/17 0242  NA 138 138   < > 138 138 138 136 137  K 3.6 3.6   < > 4.9 4.6 3.8 3.5 3.9  CL 104 103   < > 102 99 97* 95* 96*  CO2 24 26   < > 28 30 31 31  33*  GLUCOSE 97 122*   < > 95 113*  108* 112* 116*  BUN 18 16   < > 15 23 23  21  20  CREATININE 0.98 0.88   < > 1.08* 1.29* 1.17* 1.22* 1.08*  CALCIUM 8.3* 8.2*   < > 8.3* 8.5* 8.1* 8.3* 8.7*  MG  --  1.8  --   --   --   --   --   --   AST 26 23  --   --   --   --   --   --   ALT 28 25  --   --   --   --   --   --   ALKPHOS 97 90  --   --   --   --   --   --   BILITOT 1.0 1.5*  --   --   --   --   --   --    < > = values in this interval not displayed.   ------------------------------------------------------------------------------------------------------------------ No results for input(s): CHOL, HDL, LDLCALC, TRIG, CHOLHDL, LDLDIRECT in the last 72 hours.  Lab Results  Component Value Date   HGBA1C 6.1 (H) 11/03/2017   ------------------------------------------------------------------------------------------------------------------ No results for input(s): TSH, T4TOTAL, T3FREE, THYROIDAB in the last 72 hours.  Invalid input(s): FREET3 ------------------------------------------------------------------------------------------------------------------ No results for input(s): VITAMINB12, FOLATE, FERRITIN, TIBC, IRON, RETICCTPCT in the last 72 hours.  Coagulation profile No results for input(s): INR, PROTIME in the last 168 hours.  No results for input(s): DDIMER in the last 72 hours.  Cardiac Enzymes Recent Labs  Lab 11/02/17 2327 11/03/17 0328  TROPONINI <0.03 <0.03   ------------------------------------------------------------------------------------------------------------------    Component Value Date/Time   BNP 238.4 (H) 11/06/2017 0712   Roxan Hockey M.D on 11/09/2017 at 4:16 PM   Go to [email or web address] - password [email or web address] for contact info  [facility] - Office  [phone]

## 2017-11-09 NOTE — NC FL2 (Signed)
[place] MEDICAID FL2 LEVEL OF CARE SCREENING TOOL     IDENTIFICATION  Patient Name: [name] Birthdate: 08-06-1933 Sex: female Admission Date (Current Location): 11/02/2017  [facility] and [place] Number:  Herbalist and Address:  [facility] [place], [place] [address] [place], [place] [address]      Provider Number: [id number]  Attending Physician Name and Address:  Roxan Hockey, MD  Relative Name and Phone Number:  Kili Gracy, [phone]    Current Level of Care: Hospital Recommended Level of Care: [place] Prior Approval Number:    Date Approved/Denied:   PASRR Number: [id number]  Discharge Plan: Home([facility])    Current Diagnoses: Patient Active Problem List   Diagnosis Date Noted  . Atrial fibrillation with RVR ([place]) 11/02/2017  . Symptomatic anemia 11/02/2017  . Atrial fibrillation ([place]) 01/21/2015  . Diarrhea 04/24/2014  . S/P laparoscopic cholecystectomy 02/26/2011  . Hypertension 02/05/2011  . Myocardial infarction ([place]) 02/05/2011  . Hyperlipidemia 02/05/2011  . GERD (gastroesophageal reflux disease) 02/05/2011  . Anxiety 02/05/2011  . Depression 02/05/2011  . Hepatic steatosis 02/05/2011  . Pulmonary hypertension ([place]) 02/05/2011    Orientation RESPIRATION BLADDER Height & Weight     Self, Time, Situation, Place  Normal Continent Weight: 207 lb (93.9 kg) Height:  5\' 3"  (160 cm)  BEHAVIORAL SYMPTOMS/MOOD NEUROLOGICAL BOWEL NUTRITION STATUS      Continent Diet(heart healthy)  AMBULATORY STATUS COMMUNICATION OF NEEDS Skin   Limited Assist Verbally                         Personal Care Assistance Level of Assistance  Bathing, Feeding, Dressing Bathing Assistance: Limited assistance Feeding assistance: Independent Dressing Assistance: Limited assistance     Functional Limitations Info  Sight, Hearing, Speech Sight Info: Adequate Hearing Info: Adequate Speech Info: Adequate     SPECIAL CARE FACTORS FREQUENCY  PT (By licensed PT), OT (By licensed OT)     PT Frequency: 3x wk OT Frequency: 3x wk            Contractures Contractures Info: Not present    Additional Factors Info  Code Status, Allergies Code Status Info: full code Allergies Info: DILAUDID HYDROMORPHONE HCL, ASPIRIN, LOSARTAN, BACTRIM SULFAMETHOXAZOLE-TRIMETHOPRIM, SULFUR            Current Medications (11/09/2017):  This is the current hospital active medication list Current Facility-Administered Medications  Medication Dose Route Frequency Provider Last Rate Last Dose  . acetaminophen (TYLENOL) tablet 650 mg  650 mg Oral Q6H PRN Toy Baker, MD       Or  . acetaminophen (TYLENOL) suppository 650 mg  650 mg Rectal Q6H PRN Doutova, Anastassia, MD      . amiodarone (PACERONE) tablet 400 mg  400 mg Oral BID [name], MD   400 mg at 11/09/17 0936  . diltiazem (CARDIZEM CD) 24 hr capsule 180 mg  180 mg Oral Daily Domenic Polite, MD   180 mg at 11/09/17 0936  . dorzolamide (TRUSOPT) 2 % ophthalmic solution 1 drop  1 drop Left Eye BID Toy Baker, MD   1 drop at 11/09/17 0937  . HYDROcodone-acetaminophen (NORCO/VICODIN) 5-325 MG per tablet 1-2 tablet  1-2 tablet Oral Q4H PRN Doutova, Anastassia, MD      . hydrocortisone cream 1 %   Topical TID PRN Domenic Polite, MD      . latanoprost (XALATAN) 0.005 % ophthalmic solution 1 drop  1 drop Left Eye QHS  Toy Baker, MD   1 drop at 11/08/17 2100  . metoprolol tartrate (LOPRESSOR) tablet 25 mg  25 mg Oral BID [name], MD   25 mg at 11/09/17 0936  . ondansetron (ZOFRAN) tablet 4 mg  4 mg Oral Q6H PRN Doutova, Anastassia, MD       Or  . ondansetron (ZOFRAN) injection 4 mg  4 mg Intravenous Q6H PRN Doutova, Anastassia, MD   4 mg at 11/04/17 1845  . pantoprazole (PROTONIX) EC tablet 40 mg  40 mg Oral Q0600 Jackquline Denmark, MD   40 mg at 11/09/17 0937  . rivaroxaban (XARELTO) tablet 20 mg  20 mg Oral Q supper Domenic Polite,  MD   20 mg at 11/08/17 1826  . rosuvastatin (CRESTOR) tablet 5 mg  5 mg Oral QHS Toy Baker, MD   5 mg at 11/08/17 2100     Discharge Medications: Please see discharge summary for a list of discharge medications.  Relevant Imaging Results:  Relevant Lab Results:   Additional Information SS# [id number]  Wende Neighbors, LCSW

## 2017-11-09 NOTE — Care Management Note (Addendum)
Case Management Note  Patient Details  Name: [name] MRN: [id number] Date of Birth: 02/17/1934  Subjective/Objective:   Pt admitted with A fib                  Action/Plan:  PTA independent from ALF.  Pt has PCP and denied barriers with obtaining/paying for medications.  CM explained to both pt and son [name] the recommendation of HHPT and 24 hour supervision (SNF if goals are not met).  Pts family are looking at providing adequate supervision if pt returns to ALF (ALF will not provide recommended supervsion) .   ALF informed CM that facility does not have a preference of [place] agency.  Pt will discuss information provided with son and CM will follow back up with pt.   Expected Discharge Date:                  Expected Discharge Plan:  Home/Self Care  In-House Referral:     Discharge planning Services  CM Consult  Post Acute Care Choice:    Choice offered to:     DME Arranged:    DME Agency:     HH Arranged:    HH Agency:     Status of Service:  In process, will continue to follow  If discussed at Long Length of Stay Meetings, dates discussed:    Additional Comments: Update:  CM spoke with son - son/pt are interested in exploring SNF option as pt does not have 24 hour supervision. CM contacted CSW and requested CSW to follow up with son regarding SNF as pt is nearing discharge readiness.   Barriers to discharge include:  HR remains uncontrolled and medication regimen not yet established. Pt is scheduled for cardioversion 8/15 [place], [place] 11/09/2017, 2:39 PM

## 2017-11-09 NOTE — Clinical Social Work Note (Signed)
Clinical Social Work Assessment  Patient Details  Name: [name] MRN: [id number] Date of Birth: October 27, 1933  Date of referral:  11/09/17               Reason for consult:  Discharge Planning                Permission sought to share information with:  Family Supports Permission granted to share information::  Yes, Verbal Permission Granted  Name::     Beyonka Pitney  Agency::     Relationship::  son  Contact Information:  [phone]  Housing/Transportation Living arrangements for the past 2 months:  Assisted Living Facility([facility]) Source of Information:  Adult Children Patient Interpreter Needed:  None Criminal Activity/Legal Involvement Pertinent to Current Situation/Hospitalization:  No - Comment as needed Significant Relationships:  Adult Children, Community Support Lives with:  Facility Resident Do you feel safe going back to the place where you live?  Yes Need for family participation in patient care:     Care giving concerns:  CSW spoke to patients son david via phone   Social Worker assessment / plan:  Shanon Brow stated that patient is from [place] and wanted some clarifications on discharge plans. Shanon Brow stated he was confused on what would happen once patient discharge. CSW explained to Shanon Brow the process of SNF and also his choice of sending patient back to ALF. Shanon Brow requested that CSW please fax patient out to surrounding SNF as a back up. Shanon Brow stated that patient is not agreeable to go to SNF but stated he wants a SNF as a back up. CSW spoke with patient at bedside as well to make sure she was agreeable to fax out  Employment status:  Retired Forensic scientist:  Medicare PT Recommendations:  [place] / Referral to community resources:  [place]  Patient/Family's Response to care:  Patient and son appreciative of CSW role in care  Patient/Family's Understanding of and Emotional Response to  Diagnosis, Current Treatment, and Prognosis:  Family will discuss discharge plan amongst themselves and make CSW aware of decision.   Emotional Assessment Appearance:  Appears stated age Attitude/Demeanor/Rapport:  Unable to Assess Affect (typically observed):  Unable to Assess Orientation:  Oriented to Self, Oriented to Situation, Oriented to Place, Oriented to  Time Alcohol / Substance use:  Not Applicable Psych involvement (Current and /or in the community):  No (Comment)  Discharge Needs  Concerns to be addressed:  Care Coordination Readmission within the last 30 days:  No Current discharge risk:  Dependent with Mobility Barriers to Discharge:  Continued Medical Work up   [employer], LCSW 11/09/2017, 5:26 PM

## 2017-11-09 NOTE — Progress Notes (Signed)
Subjective:  No significant bleeding Rate uncontrolled  Objective:  Vital Signs in the last 24 hours: Temp:  [97.4 F (36.3 C)-98.5 F (36.9 C)] 98 F (36.7 C) (08/14 1918) Pulse Rate:  [76-125] 86 (08/14 2137) Resp:  [15-25] 23 (08/14 1918) BP: (109-130)/(65-95) 128/95 (08/14 2137) SpO2:  [94 %-99 %] 99 % (08/14 1554)  Intake/Output from previous day: 08/13 0701 - 08/14 0700 In: -  Out: 1400 [Urine:1400] Intake/Output from this shift: No intake/output data recorded.  Physical Exam: Nursing note and vitals reviewed. Constitutional: She is oriented to person, place, and time. She appears well-developed and well-nourished. No distress.  HENT:  Head: Normocephalic and atraumatic.  Neck: Normal range of motion. Neck supple. No JVD present.  Cardiovascular: Normal rate.Irregularly irregular rhythm with tachycardia present. Exam reveals no gallop.  No murmur heard. Respiratory: Right sided rales GI: Soft. Bowel sounds are normal. There is no tenderness.  Musculoskeletal: Normal range of motion. She exhibits edema (1+ b/l).  Lymphadenopathy:    She has no cervical adenopathy.  Neurological: She is alert and oriented to person, place, and time. She has normal reflexes. No cranial nerve deficit.  Skin: Skin is warm and dry.  Psychiatric: She has a normal mood and affect.   Lab Results: Recent Labs    11/08/17 0311 11/09/17 0242  WBC 5.4 5.5  HGB 8.9* 9.1*  PLT 250 272   Recent Labs    11/08/17 0311 11/09/17 0242  NA 136 137  K 3.5 3.9  CL 95* 96*  CO2 31 33*  GLUCOSE 112* 116*  BUN 21 20  CREATININE 1.22* 1.08*    EKG 11/02/2017: Afib w/ventricular rate 102 bpm. RBBB  Echocardiogram 11/06/2017: Study Conclusions  - Left ventricle: The cavity size was normal. Systolic function was   normal. The estimated ejection fraction was in the range of 60%   to 65%. Unable to assess diastolic function due to Afib. Wall   motion was normal; there were no regional wall  motion   abnormalities. - Mitral valve: Moderately dilated annulus. There was moderate   regurgitation. - Left atrium: The atrium was severely dilated. - Right ventricle: Systolic function was mildly reduced. - Right atrium: The atrium was severely dilated. - Tricuspid valve: Mildly dilated annulus. There was mild-moderate   regurgitation. - Pulmonary arteries: PA peak pressure: 45 mm Hg (S).   Echocardiogram 01/22/2015: Study Conclusions  - Left ventricle: The cavity size was normal. There was mild concentric hypertrophy. Systolic function was normal. The estimated ejection fraction was in the range of 60% to 65%. Wall motion was normal; there were no regional wall motion abnormalities. The study is not technically sufficient to allow evaluation of LV diastolic function. - Mitral valve: Calcified annulus. There was mild regurgitation. - Left atrium: The atrium was moderately dilated. - Atrial septum: No defect or patent foramen ovale was identified. - Pulmonary arteries: PA peak pressure: 34 mm Hg (S).  Impressions:  - The right ventricular systolic pressure was increased consistent with mild pulmonary hypertension.  EGD 11/04/2017: A few, 4 to 6 mm non-bleeding erosions were found in the gastric body and in the gastric antrum withmoderate surrounding gastritis. There were no stigmata of recent bleeding. Biopsies were taken with a cold forceps for histology. Estimated blood loss was minimal.  Assessment/Recommendations:  84 y/o Caucasian female with hypertension, hyperlipidemia, CAD s/p LAD PCI in 2012, HFpEF, borderline DM, severe degenerative disc disease, persistent Afib on Xarelto, now admitted with symptomatic anemia  Paroxysmal Afib: Historically  paroxysmal Afib CHA2DS2VAsc score 5, annual stroke risk 7.2% Continue amiodarone 400 mg bid for now, Will discharge on 200 mg bid. Continue diltiazem 180 mg, metoprolol 25 mg bid. Unable to perform  cardioversion today due to lack of availability in endoscopy suite Will tentatively schedule for tomorrow afternoon. NPO from 8 AM onwards. Can eat breakfast before that.   Acute on chronic diastolic heart failure: Improving. Diuresing well. Diuretics held today. Will readdress tomorrow.   Symptomatic anemia: Resolved. Erosive gastritis on EGD  AKI/CKD: Resolved  CAD:  Stable. Continue crestor.   Hypertension: Controlled.   LOS: 7 days    Easton Fetty J Rheagan Nayak 11/09/2017, 10:42 PM  Tkeyah Burkman Esther Hardy, MD [facility] [place] Pager: [phone] Office: [phone] If no answer Cell [phone]

## 2017-11-09 NOTE — Progress Notes (Signed)
Afib w/RVR in 120s even at rest. Plan for cardioversion this afternoon. Keep NPO.  Nigel Mormon, MD [facility] Pager: [phone] Office: [phone] If no answer Cell [phone]

## 2017-11-10 ENCOUNTER — Inpatient Hospital Stay (HOSPITAL_COMMUNITY): Payer: Medicare Other | Admitting: Anesthesiology

## 2017-11-10 ENCOUNTER — Encounter (HOSPITAL_COMMUNITY)
Admission: EM | Disposition: A | Discharge status: Home Health | Payer: Self-pay | Source: Home / Self Care | Attending: Internal Medicine

## 2017-11-10 ENCOUNTER — Encounter (HOSPITAL_COMMUNITY): Payer: Self-pay | Admitting: *Deleted

## 2017-11-10 DIAGNOSIS — I4891 Unspecified atrial fibrillation: Secondary | ICD-10-CM

## 2017-11-10 DIAGNOSIS — I10 Essential (primary) hypertension: Secondary | ICD-10-CM

## 2017-11-10 DIAGNOSIS — D649 Anemia, unspecified: Secondary | ICD-10-CM

## 2017-11-10 DIAGNOSIS — I272 Pulmonary hypertension, unspecified: Secondary | ICD-10-CM

## 2017-11-10 DIAGNOSIS — K219 Gastro-esophageal reflux disease without esophagitis: Secondary | ICD-10-CM

## 2017-11-10 HISTORY — PX: CARDIOVERSION: SHX1299

## 2017-11-10 SURGERY — CARDIOVERSION
Anesthesia: General

## 2017-11-10 MED ORDER — SODIUM CHLORIDE 0.9 % IV SOLN
INTRAVENOUS | Status: DC | PRN
  Administered 2017-11-10: 12:00:00 via INTRAVENOUS

## 2017-11-10 MED ORDER — LIDOCAINE 2% (20 MG/ML) 5 ML SYRINGE
INTRAMUSCULAR | Status: DC | PRN
  Administered 2017-11-10: 60 mg via INTRAVENOUS

## 2017-11-10 MED ORDER — TORSEMIDE 20 MG PO TABS
20.0000 mg | ORAL_TABLET | Freq: Two times a day (BID) | ORAL | Status: DC
  Administered 2017-11-10 – 2017-11-11 (×2): 20 mg via ORAL
  Filled 2017-11-10 (×2): qty 1

## 2017-11-10 MED ORDER — PROPOFOL 10 MG/ML IV BOLUS
INTRAVENOUS | Status: DC | PRN
  Administered 2017-11-10: 20 mg via INTRAVENOUS
  Administered 2017-11-10: 30 mg via INTRAVENOUS
  Administered 2017-11-10: 50 mg via INTRAVENOUS

## 2017-11-10 MED ORDER — SODIUM CHLORIDE 0.9 % IV SOLN
INTRAVENOUS | Status: DC
  Administered 2017-11-10: 500 mL via INTRAVENOUS

## 2017-11-10 NOTE — Transfer of Care (Signed)
Immediate Anesthesia Transfer of Care Note  [name]  Procedure(s) Performed: CARDIOVERSION (N/A )  Patient Location: Endoscopy Unit  Anesthesia Type:General  Level of Consciousness: awake, alert , drowsy and patient cooperative  Airway & Oxygen Therapy: Patient Spontanous Breathing and Patient connected to nasal cannula oxygen  Post-op Assessment: Report given to RN and Post -op Vital signs reviewed and stable  Post vital signs: Reviewed and stable  Last Vitals:  Vitals Value Taken Time  BP    Temp    Pulse    Resp    SpO2      Last Pain:  Vitals:   11/10/17 1206  TempSrc: Oral  PainSc: 0-No pain         Complications: No apparent anesthesia complications

## 2017-11-10 NOTE — Progress Notes (Signed)
Patient taken by endoscopy RN via wheelchair for cardioversion.

## 2017-11-10 NOTE — Interval H&P Note (Signed)
History and Physical Interval Note:  11/10/2017 12:25 PM  [name]  has presented today for surgery, with the diagnosis of a fib  The various methods of treatment have been discussed with the patient and family. After consideration of risks, benefits and other options for treatment, the patient has consented to  Procedure(s): CARDIOVERSION (N/A) as a surgical intervention .  The patient's history has been reviewed, patient examined, no change in status, stable for surgery.  I have reviewed the patient's chart and labs.  Questions were answered to the patient's satisfaction.    She was only been off Xarelto for two days (08/09 and 08/10). Risks of stroke are low compared to risks of bleeding given her erosive gastritis. I have discussed the benefits/risks with the patient.    [place]

## 2017-11-10 NOTE — Anesthesia Procedure Notes (Signed)
Procedure Name: General with mask airway Date/Time: 11/10/2017 12:30 PM Performed by: Renato Shin, CRNA Pre-anesthesia Checklist: Patient identified, Emergency Drugs available, Suction available and Patient being monitored Patient Re-evaluated:Patient Re-evaluated prior to induction Oxygen Delivery Method: Ambu bag Preoxygenation: Pre-oxygenation with 100% oxygen Induction Type: IV induction Placement Confirmation: positive ETCO2,  CO2 detector and breath sounds checked- equal and bilateral Dental Injury: Teeth and Oropharynx as per pre-operative assessment

## 2017-11-10 NOTE — NC FL2 (Signed)
[place] MEDICAID FL2 LEVEL OF CARE SCREENING TOOL     IDENTIFICATION  Patient Name: [name] Birthdate: Sep 04, 1933 Sex: female Admission Date (Current Location): 11/02/2017  [facility] and [place] Number:  Herbalist and Address:  [facility], [place] [address] [place], [place] [address]      Provider Number: [id number]  Attending Physician Name and Address:  Edwin Dada, *  Relative Name and Phone Number:  Dale Strausser, [phone]    Current Level of Care: Hospital Recommended Level of Care: [place] Prior Approval Number:    Date Approved/Denied:   PASRR Number: [id number]  Discharge Plan: Home([facility])    Current Diagnoses: Patient Active Problem List   Diagnosis Date Noted  . Atrial fibrillation with RVR ([place]) 11/02/2017  . Symptomatic anemia 11/02/2017  . Atrial fibrillation ([place]) 01/21/2015  . Diarrhea 04/24/2014  . S/P laparoscopic cholecystectomy 02/26/2011  . Hypertension 02/05/2011  . Myocardial infarction ([place]) 02/05/2011  . Hyperlipidemia 02/05/2011  . GERD (gastroesophageal reflux disease) 02/05/2011  . Anxiety 02/05/2011  . Depression 02/05/2011  . Hepatic steatosis 02/05/2011  . Pulmonary hypertension ([place]) 02/05/2011    Orientation RESPIRATION BLADDER Height & Weight     Self, Time, Situation, Place  Normal Continent Weight: 207 lb (93.9 kg) Height:  5\' 3"  (160 cm)  BEHAVIORAL SYMPTOMS/MOOD NEUROLOGICAL BOWEL NUTRITION STATUS      Continent Diet(heart healthy)  AMBULATORY STATUS COMMUNICATION OF NEEDS Skin   Limited Assist Verbally                         Personal Care Assistance Level of Assistance  Bathing, Feeding, Dressing Bathing Assistance: Limited assistance Feeding assistance: Independent Dressing Assistance: Limited assistance     Functional Limitations Info  Sight, Hearing, Speech Sight Info: Adequate Hearing Info: Adequate Speech Info:  Adequate    SPECIAL CARE FACTORS FREQUENCY  PT (By licensed PT), OT (By licensed OT)     PT Frequency: 3x wk OT Frequency: 3x wk            Contractures Contractures Info: Not present    Additional Factors Info  Code Status, Allergies Code Status Info: full code Allergies Info: DILAUDID HYDROMORPHONE HCL, ASPIRIN, LOSARTAN, BACTRIM SULFAMETHOXAZOLE-TRIMETHOPRIM, SULFUR            Current Medications (11/10/2017):  This is the current hospital active medication list Current Facility-Administered Medications  Medication Dose Route Frequency Provider Last Rate Last Dose  . acetaminophen (TYLENOL) tablet 650 mg  650 mg Oral Q6H PRN Toy Baker, MD       Or  . acetaminophen (TYLENOL) suppository 650 mg  650 mg Rectal Q6H PRN Doutova, Anastassia, MD      . amiodarone (PACERONE) tablet 400 mg  400 mg Oral BID [name], MD   400 mg at 11/10/17 0921  . diltiazem (CARDIZEM CD) 24 hr capsule 180 mg  180 mg Oral Daily Domenic Polite, MD   180 mg at 11/10/17 0933  . dorzolamide (TRUSOPT) 2 % ophthalmic solution 1 drop  1 drop Left Eye BID Toy Baker, MD   1 drop at 11/10/17 0934  . HYDROcodone-acetaminophen (NORCO/VICODIN) 5-325 MG per tablet 1-2 tablet  1-2 tablet Oral Q4H PRN Doutova, Anastassia, MD      . hydrocortisone cream 1 %   Topical TID PRN Domenic Polite, MD      . latanoprost (XALATAN) 0.005 % ophthalmic solution 1 drop  1 drop Left Eye  Francena Hanly, MD   1 drop at 11/09/17 2140  . metoprolol tartrate (LOPRESSOR) tablet 25 mg  25 mg Oral BID [name], MD   25 mg at 11/10/17 0932  . ondansetron (ZOFRAN) tablet 4 mg  4 mg Oral Q6H PRN Doutova, Anastassia, MD       Or  . ondansetron (ZOFRAN) injection 4 mg  4 mg Intravenous Q6H PRN Doutova, Anastassia, MD   4 mg at 11/04/17 1845  . pantoprazole (PROTONIX) EC tablet 40 mg  40 mg Oral Q0600 Jackquline Denmark, MD   40 mg at 11/10/17 0520  . rivaroxaban (XARELTO) tablet 20 mg  20 mg Oral Q supper Domenic Polite, MD   20 mg at 11/09/17 1727  . rosuvastatin (CRESTOR) tablet 5 mg  5 mg Oral QHS Toy Baker, MD   5 mg at 11/09/17 2137  . torsemide (DEMADEX) tablet 20 mg  20 mg Oral BID Patwardhan, Reynold Bowen, MD         Discharge Medications: Please see discharge summary for a list of discharge medications.  Relevant Imaging Results:  Relevant Lab Results:   Additional Information SS# [id number]  Wende Neighbors, LCSW

## 2017-11-10 NOTE — Plan of Care (Signed)
  Problem: Health Behavior/Discharge Planning: Goal: Ability to manage health-related needs will improve Outcome: Progressing   Problem: Clinical Measurements: Goal: Ability to maintain clinical measurements within normal limits will improve Outcome: Progressing Goal: Will remain free from infection Outcome: Progressing Goal: Diagnostic test results will improve Outcome: Progressing Goal: Respiratory complications will improve Outcome: Progressing   Problem: Nutrition: Goal: Adequate nutrition will be maintained Outcome: Progressing   Problem: Coping: Goal: Level of anxiety will decrease Outcome: Progressing   Problem: Elimination: Goal: Will not experience complications related to bowel motility Outcome: Progressing

## 2017-11-10 NOTE — Progress Notes (Signed)
Subjective:  No significant bleeding Rate uncontrolled  Objective:  Vital Signs in the last 24 hours: Temp:  [97.4 F (36.3 C)-98.9 F (37.2 C)] 97.4 F (36.3 C) (08/15 0800) Pulse Rate:  [76-154] 154 (08/15 0819) Resp:  [18-23] 18 (08/15 0800) BP: (110-133)/(61-95) 125/69 (08/15 0819) SpO2:  [95 %-99 %] 95 % (08/15 0800)  Intake/Output from previous day: 08/14 0701 - 08/15 0700 In: -  Out: 650 [Urine:650] Intake/Output from this shift: No intake/output data recorded.  Physical Exam: Nursing note and vitals reviewed. Constitutional: She is oriented to person, place, and time. She appears well-developed and well-nourished. No distress.  HENT:  Head: Normocephalic and atraumatic.  Neck: Normal range of motion. Neck supple. No JVD present.  Cardiovascular: Normal rate.Irregularly irregular rhythm with tachycardia present. Exam reveals no gallop.  No murmur heard. Respiratory: Right sided rales GI: Soft. Bowel sounds are normal. There is no tenderness.  Musculoskeletal: Normal range of motion. She exhibits edema (1+ b/l).  Lymphadenopathy:    She has no cervical adenopathy.  Neurological: She is alert and oriented to person, place, and time. She has normal reflexes. No cranial nerve deficit.  Skin: Skin is warm and dry.  Psychiatric: She has a normal mood and affect.   Lab Results: Recent Labs    11/08/17 0311 11/09/17 0242  WBC 5.4 5.5  HGB 8.9* 9.1*  PLT 250 272   Recent Labs    11/08/17 0311 11/09/17 0242  NA 136 137  K 3.5 3.9  CL 95* 96*  CO2 31 33*  GLUCOSE 112* 116*  BUN 21 20  CREATININE 1.22* 1.08*    EKG 11/02/2017: Afib w/ventricular rate 102 bpm. RBBB  Echocardiogram 11/06/2017: Study Conclusions  - Left ventricle: The cavity size was normal. Systolic function was   normal. The estimated ejection fraction was in the range of 60%   to 65%. Unable to assess diastolic function due to Afib. Wall   motion was normal; there were no regional  wall motion   abnormalities. - Mitral valve: Moderately dilated annulus. There was moderate   regurgitation. - Left atrium: The atrium was severely dilated. - Right ventricle: Systolic function was mildly reduced. - Right atrium: The atrium was severely dilated. - Tricuspid valve: Mildly dilated annulus. There was mild-moderate   regurgitation. - Pulmonary arteries: PA peak pressure: 45 mm Hg (S).   Echocardiogram 01/22/2015: Study Conclusions  - Left ventricle: The cavity size was normal. There was mild concentric hypertrophy. Systolic function was normal. The estimated ejection fraction was in the range of 60% to 65%. Wall motion was normal; there were no regional wall motion abnormalities. The study is not technically sufficient to allow evaluation of LV diastolic function. - Mitral valve: Calcified annulus. There was mild regurgitation. - Left atrium: The atrium was moderately dilated. - Atrial septum: No defect or patent foramen ovale was identified. - Pulmonary arteries: PA peak pressure: 34 mm Hg (S).  Impressions:  - The right ventricular systolic pressure was increased consistent with mild pulmonary hypertension.  EGD 11/04/2017: A few, 4 to 6 mm non-bleeding erosions were found in the gastric body and in the gastric antrum withmoderate surrounding gastritis. There were no stigmata of recent bleeding. Biopsies were taken with a cold forceps for histology. Estimated blood loss was minimal.  Assessment/Recommendations:  84 y/o Caucasian female with hypertension, hyperlipidemia, CAD s/p LAD PCI in 2012, HFpEF, borderline DM, severe degenerative disc disease, persistent Afib on Xarelto, now admitted with symptomatic anemia  Paroxysmal Afib: Historically  paroxysmal Afib CHA2DS2VAsc score 5, annual stroke risk 7.2% Continue amiodarone 400 mg bid for now, Will discharge on 200 mg bid. Continue diltiazem 180 mg, metoprolol 25 mg bid.  Cardioversion  scheduled for 2:15 p.m. today.  Keep n.p.o.  Acute on chronic diastolic heart failure: Improving. Recommend maintenance torsemide 20 mg PO bid.   Symptomatic anemia: Resolved. Erosive gastritis on EGD  AKI/CKD: Resolved  CAD:  Stable. Continue crestor.   Hypertension: Controlled.   LOS: 8 days    Keegen Heffern J Kynedi Profitt 11/10/2017, 10:29 AM  [place], MD [facility]. [place] Pager: [phone] Office: [phone] If no answer Cell [phone]

## 2017-11-10 NOTE — Progress Notes (Signed)
Patient returned to room by endoscopy RN via wheelchair.

## 2017-11-10 NOTE — Consult Note (Signed)
[facility] CM Primary Care Navigator  11/10/2017  [name] Jan 20, 1934 [id number]   Met with patient and son Shanon Brow) at the bedside to identify possible discharge needs. Son reports that patient had low hemoglobin (7.5) at provider's office, was sent to the ED and found to be severely anemic requiring blood transfusion which led to this admission. (Iron deficiency anemia, Atrial fibrillation with rapid ventricular response status post cardioversion, acute on chronic diastolic congestive HF)  Patient endorses Dr. Jani Gravel with [facility] as her primary care provider.   Patient reports that she is a resident of [place] facility for 6 weeks now. Patientstates thatfacility staffprovide carefor herneeds whichincludes dispensing andadministering ofhermedicationsfrom the facility pharmacy, and facility alsoprovidestransportation to herdoctors' appointments. Her sons Kasandra Knudsen and Shanon Brow) or daughter in-law Sharyn Lull) can also provide transportation to her doctors' appointments if needed.  Anticipated discharge plan is still to be determined pending improvement per son and patient, and stated still looking at options for now.  Per MD note, if heart rate stabilizes overnight discharge plan is SNF (skilled nursing facility)vs. home with home health services.   Patientand sonvoiced understanding to call primary care provider's office whenshereturns back homefor a post discharge follow-upvisit within1- 2 weeksor sooner if needs arise.Patient letter (with PCP's contact number) was provided asareminder.   Discussed with patientand sonregarding [facility] CM services available for health management/ resourcesat home, and was interested about it, but still unsure what the plan will be for discharge at this time. Patient and son expressedunderstanding of needto seekreferral from primary care provider to [facility] care  management if deemednecessaryand appropriate for anyservices in thenearfuture- once patient returns home.  [facility] care management information was provided for future needs thatpatient may have.  Primary care provider's office is listed as providing transition of care (TOC).    For additional questions please contact:  Edwena Felty A. Roberto Romanoski, BSN, RN-BC [facility] PRIMARY CARE Navigator Cell: [phone]

## 2017-11-10 NOTE — Progress Notes (Signed)
PROGRESS NOTE    [name]  XBM:[id number] DOB: 01/18/1934 DOA: 11/02/2017 PCP: Jani Gravel, MD      Brief Narrative:  [name] is a 84 y.o. F with HTN, CAD s/p PCI 2012, dCHF, DM, severe DDD of back, pAF on Xarelto who was admitted with fatigue, found to be very anemic to 8 g/dL from baseline 11-12 g/dL, required blood transfusion as well as Afib with RVR requiring diltiazem drip.     Assessment & Plan:  Atrial fibrillation with RVR CHA2DS2-Vasc 5, on Xarelto.  Rates difficult to control.  Amiodarone, metoprolol and diltiazem were titrated, and rates have been controlled at rest in the 80s-100s.  Underwent DCCV today, failed x3.   -Monitor HR overnight -Likely D/c tomorrow -Continue Xarelto -Continue amiodarone change to 200 BID -Continue Metoprolol and diltiazem    Acute on chronic iron deficiency anemia Suspected chronic GI bleed Underwent EGD this hospitalization, only erosive gastropathy, AC restarted on 8/11.  Hgb stable today at 9. -Continue PPI  Acute on chronic diastolic CHF EF 02-72%, diuresed with IV Lasix and restarted on home Torsemide.  Appears euvolemic today.  No orthopnea. -Continue torsemide  Diabetes BG well controlled with diet.  Chronic kidney disease stage III Creatinine stable, new basleine 1-1.2  Hypertension Coronary disease secondary prevention -Continue metoprolo -Continue statin  Other medications -Continue glaucoma eye drops        DVT prophylaxis: N?A on systemic anticoagulation Code Status: FULL Family Communication: Son at bedside MDM and disposition Plan: The below labs and imaging reports were reviewed and summarized above.    The patient was admitted with dyspnea, found to have anemia, requiring transfusion. Then complicated by severe CHF flare requiring IV diuresis and afib with RVR, difficult to manage.  Now stable, completed diuresis, failed DCCV, if HR stable overnight, to SNF vs HH  toorrow.   Consultants:   GI  Cardiology  Procedures:   Echo 8/11 LV EF: 60% -   65%  ------------------------------------------------------------------- Indications:      CHF - 428.0.  ------------------------------------------------------------------- History:   PMH:   Atrial fibrillation.  Coronary artery disease. Mitral valve disease.  PMH:   Myocardial infarction.  Risk factors:  Hypertension. Diabetes mellitus. Dyslipidemia.  ------------------------------------------------------------------- Study Conclusions  - Left ventricle: The cavity size was normal. Systolic function was   normal. The estimated ejection fraction was in the range of 60%   to 65%. Unable to assess diastolic function due to Afib. Wall   motion was normal; there were no regional wall motion   abnormalities. - Mitral valve: Moderately dilated annulus. There was moderate   regurgitation. - Left atrium: The atrium was severely dilated. - Right ventricle: Systolic function was mildly reduced. - Right atrium: The atrium was severely dilated. - Tricuspid valve: Mildly dilated annulus. There was mild-moderate   regurgitation. - Pulmonary arteries: PA peak pressure: 45 mm Hg (S).    EGD 8/9 Impression:  - Non-bleeding erosive gastropathy. Biopsied. - The examination was otherwise normal Recommendations: - Return patient to hospital ward for ongoing care. - Resume previous diet. - resume Xeralto from 8/11 onwards. - Await pathology results. - Avoid ibuprofen, naproxen, or other non-steroidal anti-inflammatory drugs. - Use Protonix (pantoprazole) 40 mg PO daily. - Return to GI clinic in 4-6 weeks. - Will sign off for now.Pl call with any questions.  Antimicrobials:   None    Subjective: Feeling well, no dyspnea, chest pain.  A little Dizzy with PT with walking today, HRs increased to 150s  with ambulation with them.  No vomiting, confusion, fever.  Objective: Vitals:   11/10/17 1206  11/10/17 1240 11/10/17 1250 11/10/17 1258  BP: 132/69 93/65 112/73 100/61  Pulse:      Resp: 18     Temp: 98.4 F (36.9 C)     TempSrc: Oral     SpO2: 99% 97% 97% 96%  Weight:      Height:        Intake/Output Summary (Last 24 hours) at 11/10/2017 1415 Last data filed at 11/10/2017 1240 Gross per 24 hour  Intake 100 ml  Output 650 ml  Net -550 ml   Filed Weights   11/02/17 1615 11/04/17 0941  Weight: 93.9 kg 93.9 kg    Examination: General appearance: obese elderly adult female, alert and in no acute distress.  lying in bed HEENT: Anicteric, conjunctiva pink, lids and lashes normal. No nasal deformity, discharge, epistaxis.  Lips moist. Hearing normal, OP moist without oral lesions.   Skin: Warm and dry.  no jaundice.  No suspicious rashes or lesions. Cardiac: Tachycardic, irregular, nl S1-S2, no murmurs appreciated.  Capillary refill is brisk.  JVP not visible.  No LE edema.  Radia  pulses 2+ and symmetric. Respiratory: Normal respiratory rate and rhythm.  CTAB without rales or wheezes. Abdomen: Abdomen soft.  no TTP. No ascites, distension, hepatosplenomegaly.   MSK: No deformities or effusions. Neuro: Awake and alert.  EOMI, moves all extremities. Speech fluent.    Psych: Sensorium intact and responding to questions, attention normal. Affect normal.  Judgment and insight appear normal.    Data Reviewed: I have personally reviewed following labs and imaging studies:  CBC: Recent Labs  Lab 11/05/17 0249 11/06/17 0243 11/07/17 0206 11/08/17 0311 11/09/17 0242  WBC 6.7 9.1 6.2 5.4 5.5  HGB 9.6* 9.0* 8.5* 8.9* 9.1*  HCT 32.4* 29.6* 28.8* 30.1* 31.0*  MCV 81.2 79.8 80.7 80.1 79.1  PLT 248 243 257 250 510   Basic Metabolic Panel: Recent Labs  Lab 11/05/17 0249 11/06/17 0243 11/07/17 0206 11/08/17 0311 11/09/17 0242  NA 138 138 138 136 137  K 4.9 4.6 3.8 3.5 3.9  CL 102 99 97* 95* 96*  CO2 28 30 31 31  33*  GLUCOSE 95 113* 108* 112* 116*  BUN 15 23 23 21 20    CREATININE 1.08* 1.29* 1.17* 1.22* 1.08*  CALCIUM 8.3* 8.5* 8.1* 8.3* 8.7*   GFR: Estimated Creatinine Clearance: 44.5 mL/min (A) (by C-G formula based on SCr of 1.08 mg/dL (H)). Liver Function Tests: No results for input(s): AST, ALT, ALKPHOS, BILITOT, PROT, ALBUMIN in the last 168 hours. No results for input(s): LIPASE, AMYLASE in the last 168 hours. No results for input(s): AMMONIA in the last 168 hours. Coagulation Profile: No results for input(s): INR, PROTIME in the last 168 hours. Cardiac Enzymes: No results for input(s): CKTOTAL, CKMB, CKMBINDEX, TROPONINI in the last 168 hours. BNP (last 3 results) No results for input(s): PROBNP in the last 8760 hours. HbA1C: No results for input(s): HGBA1C in the last 72 hours. CBG: No results for input(s): GLUCAP in the last 168 hours. Lipid Profile: No results for input(s): CHOL, HDL, LDLCALC, TRIG, CHOLHDL, LDLDIRECT in the last 72 hours. Thyroid Function Tests: No results for input(s): TSH, T4TOTAL, FREET4, T3FREE, THYROIDAB in the last 72 hours. Anemia Panel: No results for input(s): VITAMINB12, FOLATE, FERRITIN, TIBC, IRON, RETICCTPCT in the last 72 hours. Urine analysis:    Component Value Date/Time   COLORURINE YELLOW 01/25/2015 2122   APPEARANCEUR  CLEAR 01/25/2015 2122   LABSPEC 1.012 01/25/2015 2122   PHURINE 5.5 01/25/2015 2122   GLUCOSEU NEGATIVE 01/25/2015 2122   HGBUR NEGATIVE 01/25/2015 2122   BILIRUBINUR NEGATIVE 01/25/2015 2122   KETONESUR NEGATIVE 01/25/2015 2122   PROTEINUR NEGATIVE 01/25/2015 2122   UROBILINOGEN 0.2 01/25/2015 2122   NITRITE NEGATIVE 01/25/2015 2122   LEUKOCYTESUR NEGATIVE 01/25/2015 2122   Sepsis Labs: @LABRCNTIP (procalcitonin:4,lacticacidven:4)  ) Recent Results (from the past 240 hour(s))  MRSA PCR Screening     Status: None   Collection Time: 11/02/17 11:24 PM  Result Value Ref Range Status   MRSA by PCR NEGATIVE NEGATIVE Final    Comment:        The GeneXpert MRSA Assay  (FDA approved for NASAL specimens only), is one component of a comprehensive MRSA colonization surveillance program. It is not intended to diagnose MRSA infection nor to guide or monitor treatment for MRSA infections. Performed at [facility], [place] [address] [place], [place] [address]          Radiology Studies: No results found.      Scheduled Meds: . amiodarone  400 mg Oral BID  . diltiazem  180 mg Oral Daily  . dorzolamide  1 drop Left Eye BID  . latanoprost  1 drop Left Eye QHS  . metoprolol tartrate  25 mg Oral BID  . pantoprazole  40 mg Oral Q0600  . rivaroxaban  20 mg Oral Q supper  . rosuvastatin  5 mg Oral QHS  . torsemide  20 mg Oral BID   Continuous Infusions:   LOS: 8 days    Time spent: 25 minutes    Edwin Dada, MD [facility] 11/10/2017, 2:15 PM     Pager [phone] --- please page though AMION:  [email or web address] If 7PM-7AM, please contact night-coverage

## 2017-11-10 NOTE — CV Procedure (Signed)
Direct current cardioversion:  Indication symptomatic A. Fibrillation.  Procedure: Under deep sedation administered and monitored by anesthesiology, synchronized direct current cardioversion performed. Patient was delivered with 120, 150, 200, and 200 Joules of electricity X 4 without success. Patient remained in atrial fibrillation. Patient tolerated the procedure well. No immediate complication noted.   I do not recommend further attempts of electrical cardioversion. This would be permanent atrial fibrillation with focus on rate control. I do not think this should prohibit patient's discharge to assisted living facility. Continue amiodarone 200 mg PO bid for rate control, along with metoprolol 25 mg bid and diltiazem PO 180 mg daily. Ventricular rate of around 100 at rest is acceptable. Will schedule outpatient follow up.  Nigel Mormon, MD [facility] Pager: [phone] Office: [phone] If no answer Cell [phone]

## 2017-11-10 NOTE — Anesthesia Postprocedure Evaluation (Signed)
Anesthesia Post Note  [name]  Procedure(s) Performed: CARDIOVERSION (N/A )     Patient location during evaluation: PACU Anesthesia Type: General Level of consciousness: awake and alert Pain management: pain level controlled Vital Signs Assessment: post-procedure vital signs reviewed and stable Respiratory status: spontaneous breathing, nonlabored ventilation and respiratory function stable Cardiovascular status: blood pressure returned to baseline and stable Postop Assessment: no apparent nausea or vomiting Anesthetic complications: no    Last Vitals:  Vitals:   11/10/17 1600 11/10/17 1917  BP: 105/64 121/80  Pulse: (!) 101 91  Resp: 20 19  Temp: (!) 36.4 C 36.8 C  SpO2: 97% 97%    Last Pain:  Vitals:   11/10/17 1917  TempSrc: Oral  PainSc:                  Audry Pili

## 2017-11-10 NOTE — H&P (View-Only) (Signed)
Subjective:  No significant bleeding Rate uncontrolled  Objective:  Vital Signs in the last 24 hours: Temp:  [97.4 F (36.3 C)-98.9 F (37.2 C)] 97.4 F (36.3 C) (08/15 0800) Pulse Rate:  [76-154] 154 (08/15 0819) Resp:  [18-23] 18 (08/15 0800) BP: (110-133)/(61-95) 125/69 (08/15 0819) SpO2:  [95 %-99 %] 95 % (08/15 0800)  Intake/Output from previous day: 08/14 0701 - 08/15 0700 In: -  Out: 650 [Urine:650] Intake/Output from this shift: No intake/output data recorded.  Physical Exam: Nursing note and vitals reviewed. Constitutional: She is oriented to person, place, and time. She appears well-developed and well-nourished. No distress.  HENT:  Head: Normocephalic and atraumatic.  Neck: Normal range of motion. Neck supple. No JVD present.  Cardiovascular: Normal rate.Irregularly irregular rhythm with tachycardia present. Exam reveals no gallop.  No murmur heard. Respiratory: Right sided rales GI: Soft. Bowel sounds are normal. There is no tenderness.  Musculoskeletal: Normal range of motion. She exhibits edema (1+ b/l).  Lymphadenopathy:    She has no cervical adenopathy.  Neurological: She is alert and oriented to person, place, and time. She has normal reflexes. No cranial nerve deficit.  Skin: Skin is warm and dry.  Psychiatric: She has a normal mood and affect.   Lab Results: Recent Labs    11/08/17 0311 11/09/17 0242  WBC 5.4 5.5  HGB 8.9* 9.1*  PLT 250 272   Recent Labs    11/08/17 0311 11/09/17 0242  NA 136 137  K 3.5 3.9  CL 95* 96*  CO2 31 33*  GLUCOSE 112* 116*  BUN 21 20  CREATININE 1.22* 1.08*    EKG 11/02/2017: Afib w/ventricular rate 102 bpm. RBBB  Echocardiogram 11/06/2017: Study Conclusions  - Left ventricle: The cavity size was normal. Systolic function was   normal. The estimated ejection fraction was in the range of 60%   to 65%. Unable to assess diastolic function due to Afib. Wall   motion was normal; there were no regional  wall motion   abnormalities. - Mitral valve: Moderately dilated annulus. There was moderate   regurgitation. - Left atrium: The atrium was severely dilated. - Right ventricle: Systolic function was mildly reduced. - Right atrium: The atrium was severely dilated. - Tricuspid valve: Mildly dilated annulus. There was mild-moderate   regurgitation. - Pulmonary arteries: PA peak pressure: 45 mm Hg (S).   Echocardiogram 01/22/2015: Study Conclusions  - Left ventricle: The cavity size was normal. There was mild concentric hypertrophy. Systolic function was normal. The estimated ejection fraction was in the range of 60% to 65%. Wall motion was normal; there were no regional wall motion abnormalities. The study is not technically sufficient to allow evaluation of LV diastolic function. - Mitral valve: Calcified annulus. There was mild regurgitation. - Left atrium: The atrium was moderately dilated. - Atrial septum: No defect or patent foramen ovale was identified. - Pulmonary arteries: PA peak pressure: 34 mm Hg (S).  Impressions:  - The right ventricular systolic pressure was increased consistent with mild pulmonary hypertension.  EGD 11/04/2017: A few, 4 to 6 mm non-bleeding erosions were found in the gastric body and in the gastric antrum withmoderate surrounding gastritis. There were no stigmata of recent bleeding. Biopsies were taken with a cold forceps for histology. Estimated blood loss was minimal.  Assessment/Recommendations:  84 y/o Caucasian female with hypertension, hyperlipidemia, CAD s/p LAD PCI in 2012, HFpEF, borderline DM, severe degenerative disc disease, persistent Afib on Xarelto, now admitted with symptomatic anemia  Paroxysmal Afib: Historically  paroxysmal Afib CHA2DS2VAsc score 5, annual stroke risk 7.2% Continue amiodarone 400 mg bid for now, Will discharge on 200 mg bid. Continue diltiazem 180 mg, metoprolol 25 mg bid.  Cardioversion  scheduled for 2:15 p.m. today.  Keep n.p.o.  Acute on chronic diastolic heart failure: Improving. Recommend maintenance torsemide 20 mg PO bid.   Symptomatic anemia: Resolved. Erosive gastritis on EGD  AKI/CKD: Resolved  CAD:  Stable. Continue crestor.   Hypertension: Controlled.   LOS: 8 days    Manish J Patwardhan 11/10/2017, 10:29 AM  [place], MD [facility] Pager: [phone] Office: [phone] If no answer Cell [phone]

## 2017-11-10 NOTE — Anesthesia Preprocedure Evaluation (Addendum)
Anesthesia Evaluation  Patient identified by MRN, date of birth, ID band Patient awake    Reviewed: Allergy & Precautions, NPO status , Patient's Chart, lab work & pertinent test results  History of Anesthesia Complications (+) PONV and history of anesthetic complications  Airway Mallampati: III  TM Distance: >3 FB Neck ROM: Full    Dental  (+) Dental Advisory Given, Teeth Intact   Pulmonary neg pulmonary ROS,    breath sounds clear to auscultation       Cardiovascular hypertension, Pt. on home beta blockers and Pt. on medications + CAD and + Past MI  + dysrhythmias Atrial Fibrillation + Valvular Problems/Murmurs MR  Rhythm:Irregular Rate:Tachycardia     Neuro/Psych Anxiety Depression negative neurological ROS     GI/Hepatic Neg liver ROS, GERD  Controlled,  Endo/Other  neg diabetes Obesity   Renal/GU negative Renal ROS  negative genitourinary   Musculoskeletal  (+) Arthritis ,   Abdominal   Peds  Hematology  (+) anemia ,   Anesthesia Other Findings   Reproductive/Obstetrics                           Anesthesia Physical Anesthesia Plan  ASA: III  Anesthesia Plan: General   Post-op Pain Management:    Induction: Intravenous  PONV Risk Score and Plan: 4 or greater and Treatment may vary due to age or medical condition and Propofol infusion  Airway Management Planned: Mask and Natural Airway  Additional Equipment: None  Intra-op Plan:   Post-operative Plan:   Informed Consent: I have reviewed the patients History and Physical, chart, labs and discussed the procedure including the risks, benefits and alternatives for the proposed anesthesia with the patient or authorized representative who has indicated his/her understanding and acceptance.   Dental advisory given  Plan Discussed with: CRNA and Anesthesiologist  Anesthesia Plan Comments:        Anesthesia Quick  Evaluation

## 2017-11-10 NOTE — Progress Notes (Signed)
Physical Therapy Treatment Patient Details Name: [name] MRN: [id number] DOB: Nov 19, 1933 Today's Date: 11/10/2017    History of Present Illness 84 y.o. female admitted with symptomatic anemia, dizziness, SOb and diverticulitis, AFib with cardioversion planned 8/15. PMHx: A.fib, CAD, HTn, DM 2, HLD, Diastolic CHF, CKD, GERD, mitral regurgitation    PT Comments    Pt pleasant and motivated to get up and walk around. Pt reported was up and to the bathroom earlier in morning with no SOB or dizziness. Prior to therapy patient HR in seated ranged 110-117, BP 121/73 (88) SpO2 97 on RA. On standing HR up to 150, SpO2 95, BP 125/69 (86). Pt HR recovered and able to ambulate 70 ft with HR spike to 150s with reported dizziness. Pt returned to chair with HR 131. Pt was educated on a HEP, and encouraged to work on a few times a day. Pt going for cardioversion today.     Follow Up Recommendations  Home health PT;Supervision/Assistance - 24 hour;SNF(HHPT if goals met)     Equipment Recommendations  None recommended by PT    Recommendations for Other Services       Precautions / Restrictions Precautions Precautions: Fall Precaution Comments: watch HR Restrictions Weight Bearing Restrictions: No    Mobility  Bed Mobility               General bed mobility comments: in chair on arrival  Transfers Overall transfer level: Needs assistance Equipment used: 4-wheeled walker             General transfer comment: sit to stand with guarding for safety as pt pulling up on rollator. 3 standing trials  Ambulation/Gait Ambulation/Gait assistance: Min guard Gait Distance (Feet): 70 Feet Assistive device: 4-wheeled walker Gait Pattern/deviations: Step-through pattern;Decreased stride length   Gait velocity interpretation: 1.31 - 2.62 ft/sec, indicative of limited community ambulator General Gait Details: 83' with rollator then 15' back to room after sitting on rollator. HR  115-158 with gait with pt instructed to sit as Afib spiked to 150 with pt reporting dizziness when HR elevated. Pt with seated rest Afib still fluctuating 120-140 and negated further gait with only return to room based on vitals   Stairs             Wheelchair Mobility    Modified Rankin (Stroke Patients Only)       Balance Overall balance assessment: Needs assistance Sitting-balance support: No upper extremity supported Sitting balance-Leahy Scale: Good     Standing balance support: Bilateral upper extremity supported Standing balance-Leahy Scale: Poor                              Cognition Arousal/Alertness: Awake/alert Behavior During Therapy: WFL for tasks assessed/performed Overall Cognitive Status: Within Functional Limits for tasks assessed                                        Exercises General Exercises - Lower Extremity Long Arc Quad: AROM;15 reps;Seated;Both Hip ABduction/ADduction: AROM;15 reps;Seated;Both Hip Flexion/Marching: AROM;15 reps;Seated;Both    General Comments        Pertinent Vitals/Pain Pain Assessment: No/denies pain    Home Living                      Prior Function  PT Goals (current goals can now be found in the care plan section) Progress towards PT goals: Progressing toward goals    Frequency           PT Plan Current plan remains appropriate    Co-evaluation              AM-PAC PT "6 Clicks" Daily Activity  Outcome Measure  Difficulty turning over in bed (including adjusting bedclothes, sheets and blankets)?: A Little Difficulty moving from lying on back to sitting on the side of the bed? : A Little Difficulty sitting down on and standing up from a chair with arms (e.g., wheelchair, bedside commode, etc,.)?: A Little Help needed moving to and from a bed to chair (including a wheelchair)?: A Little Help needed walking in hospital room?: A Little Help  needed climbing 3-5 steps with a railing? : A Lot 6 Click Score: 17    End of Session Equipment Utilized During Treatment: Gait belt Activity Tolerance: Patient tolerated treatment well;Treatment limited secondary to medical complications (Comment) Patient left: in chair;with call bell/phone within reach Nurse Communication: Mobility status PT Visit Diagnosis: Unsteadiness on feet (R26.81);Muscle weakness (generalized) (M62.81);Other abnormalities of gait and mobility (R26.89)     Time: 3875-6433 PT Time Calculation (min) (ACUTE ONLY): 23 min  Charges:  $Gait Training: 8-22 mins $Therapeutic Exercise: 8-22 mins                     Samuella Bruin, [place]  Acute Rehab [phone]    Samuella Bruin 11/10/2017, 9:19 AM

## 2017-11-11 LAB — GLUCOSE, CAPILLARY: Glucose-Capillary: 97 mg/dL (ref 70–99)

## 2017-11-11 MED ORDER — DILTIAZEM HCL ER COATED BEADS 180 MG PO CP24: 180.0000 mg | ORAL_CAPSULE | Freq: Every day | ORAL | 2 refills | Status: DC

## 2017-11-11 MED ORDER — AMIODARONE HCL 200 MG PO TABS: 200.0000 mg | ORAL_TABLET | Freq: Two times a day (BID) | ORAL | 2 refills | Status: DC

## 2017-11-11 MED ORDER — HYDROCORTISONE 1 % EX CREA: TOPICAL_CREAM | Freq: Three times a day (TID) | CUTANEOUS | 0 refills | Status: DC | PRN

## 2017-11-11 MED ORDER — POTASSIUM CHLORIDE ER 10 MEQ PO TBCR: 10.0000 meq | EXTENDED_RELEASE_TABLET | Freq: Every day | ORAL | 2 refills | Status: DC

## 2017-11-11 MED ORDER — TORSEMIDE 20 MG PO TABS: 20.0000 mg | ORAL_TABLET | Freq: Two times a day (BID) | ORAL | 2 refills | Status: DC

## 2017-11-11 MED ORDER — PANTOPRAZOLE SODIUM 40 MG PO TBEC: 40.0000 mg | DELAYED_RELEASE_TABLET | Freq: Every day | ORAL | 2 refills | Status: DC

## 2017-11-11 NOTE — Progress Notes (Signed)
Subjective:  Feels much better.  Denies dyspnea.  No palpitations or chest pain.  Feels like she is ready for going home and appears motivated to be active Objective:  Vital Signs in the last 24 hours: Temp:  [97.5 F (36.4 C)-99.1 F (37.3 C)] 98.5 F (36.9 C) (08/16 0741) Pulse Rate:  [87-110] 93 (08/16 0741) Resp:  [18-23] 21 (08/16 0741) BP: (93-132)/(61-80) 116/79 (08/16 0741) SpO2:  [94 %-100 %] 100 % (08/16 0741)  Intake/Output from previous day: 08/15 0701 - 08/16 0700 In: 820 [P.O.:720; I.V.:100] Out: -   Physical Exam: Blood pressure 116/79, pulse 93, temperature 98.5 F (36.9 C), temperature source Oral, resp. rate (!) 21, height 5\' 3"  (1.6 m), weight 93.9 kg, SpO2 100 %.  Body mass index is 36.67 kg/m. Physical Exam  Constitutional: She is oriented to person, place, and time.  Moderately built, moderately obese in no acute distress.  HENT:  Head: Atraumatic.  Eyes: Conjunctivae are normal.  Neck: Neck supple. No JVD present. No thyromegaly present.  Cardiovascular:  S1 is variable, S2 is normal.  Distant heart sounds.  No murmur or gallop appreciated. Vascular exam: Normal femoral. Faint popliteal and absent pedal pulse.  Pulmonary/Chest: Effort normal and breath sounds normal.  Abdominal: Soft.  Obese, pannus present.  Musculoskeletal: Normal range of motion. She exhibits no edema (Large adipose tissue is present, without edema.).  Neurological: She is alert and oriented to person, place, and time.  Skin: Skin is warm and dry.  Psychiatric: She has a normal mood and affect.    Lab Results: BMP Recent Labs    11/07/17 0206 11/08/17 0311 11/09/17 0242  NA 138 136 137  K 3.8 3.5 3.9  CL 97* 95* 96*  CO2 31 31 33*  GLUCOSE 108* 112* 116*  BUN 23 21 20   CREATININE 1.17* 1.22* 1.08*  CALCIUM 8.1* 8.3* 8.7*  GFRNONAA 43* 40* 47*  GFRAA 49* 47* 54*   CBC Latest Ref Rng & Units 11/09/2017 11/08/2017 11/07/2017  WBC 4.0 - 10.5 K/uL 5.5 5.4 6.2  Hemoglobin  12.0 - 15.0 g/dL 9.1(L) 8.9(L) 8.5(L)  Hematocrit 36.0 - 46.0 % 31.0(L) 30.1(L) 28.8(L)  Platelets 150 - 400 K/uL 272 250 257    HEMOGLOBIN A1C Lab Results  Component Value Date   HGBA1C 6.1 (H) 11/03/2017   MPG 128.37 11/03/2017    Cardiac Panel (last 3 results) Recent Labs    11/02/17 2327 11/03/17 0328  TROPONINI <0.03 <0.03    TSH Recent Labs    11/03/17 0328  TSH 0.866   Hepatic Function Panel Recent Labs    10/27/17 1031 11/02/17 1633 11/03/17 0328  PROT 5.8* 5.8* 5.6*  ALBUMIN 2.9* 2.7* 2.5*  AST 18 26 23   ALT 20 28 25   ALKPHOS 89 97 90  BILITOT 1.0 1.0 1.5*   Cardiac Studies:  EKG 11/02/2017: Atrial fibrillation with rapid ventricular response at rate of 102 bpm, normal axis, right bundle branch block.  No evidence of ischemia, normal QT interval.  Telemetry: Atrial fibrillation with rapid ventricular response, ventricular rate between 85 bpm to 112 bpm.   Echocardiogram 11/06/2017: Study Conclusions  - Left ventricle: The cavity size was normal. Systolic function was normal. The estimated ejection fraction was in the range of 60% to 65%. Unable to assess diastolic function due to Afib. Wall motion was normal; there were no regional wall motion abnormalities. - Mitral valve: Moderately dilated annulus. There was moderate regurgitation. - Left atrium: The atrium was severely dilated. -  Right ventricle: Systolic function was mildly reduced. - Right atrium: The atrium was severely dilated. - Tricuspid valve: Mildly dilated annulus. There was mild-moderate regurgitation.  Pulmonary arteries: PA peak pressure: 45 mm Hg (S).  Direct-current cardioversion 11/11/2014: Unsuccessful cardioversion from A. fib, 120/150/2 100/200 J of electricity delivered without success.  Scheduled Meds: . amiodarone  400 mg Oral BID  . diltiazem  180 mg Oral Daily  . dorzolamide  1 drop Left Eye BID  . latanoprost  1 drop Left Eye QHS  . metoprolol tartrate   25 mg Oral BID  . pantoprazole  40 mg Oral Q0600  . rivaroxaban  20 mg Oral Q supper  . rosuvastatin  5 mg Oral QHS  . torsemide  20 mg Oral BID   Continuous Infusions: PRN Meds:.acetaminophen **OR** acetaminophen, HYDROcodone-acetaminophen, hydrocortisone cream, ondansetron **OR** ondansetron (ZOFRAN) IV  Assessment/Plan:  1.  Atrial fibrillation with rapid ventricular response CHA2DS2-VASCScore: Risk Score  5,  Yearly risk of stroke  >6. 2.  CAD S/P PCI to the LAD in 2012, no recurrence of angina pectoris 3.  Acute on chronic diastolic heart failure precipitated by severe anemia secondary to GI bleed, stable and now resolbed 4.  Diabetes mellitus type 2 controlled with stage III chronic kidney disease which has remained stable.  Blood sugars are well controlled. 5.  Hypertension 6. PAD stable  Recommendation: Patient still having A. fib with RVR, I am not too concerned about RVR with regard to increasing her physical activity.  Continue amiodarone 400 mg twice daily for now, would discharge her home on the same dose for now until I see her back in the office in about 10 days.  Unfortunately cardioversion was unsuccessful.  I have reassured the patient.  I will repeat CBC in about 10 days for stability.  Diabetes is controlled, blood pressure is controlled, no evidence of critical limb ischemia, continue present medications and I will see her back, no restrictions with regard to physical activity from cardiac standpoint.   [name], M.D. 11/11/2017, 11:31 AM [facility] Pager: [phone] Office: [phone] If no answer: [phone]

## 2017-11-11 NOTE — Progress Notes (Signed)
Clinical Social Worker following patient for support and discharge need. CSW contacted [facility] to discuss patients return. CSW spoke with Benjaman Pott who stated patient lives in an apartment and has support from facility. Darlene stated facility checks in on residence every two hours and lays eye on them. Facility also states every residence has a necklace that they are suppose to wear that if they need assistance in between the two hour check they are able to press it and Med Tech come to there room to assist them. Facility stated they also contract out for there PT/OT and they will be able to provided patient with therapies. Facility stated if hospital feels that an RN is needed to follow patient at ALF they will also be able to also provide it for her. CSW made MD and RNCM aware of discussion with [facility]. CSW spoke to patient and she was agreeable to discharge back to facility.   Rhea Pink, MSW,  [place]

## 2017-11-11 NOTE — Discharge Summary (Signed)
Physician Discharge Summary  [name] DTO:[id number] DOB: 11/02/1933 DOA: 11/02/2017  PCP: Jani Gravel, MD  Admit date: 11/02/2017 Discharge date: 11/11/2017  Admitted From: [facility]  Disposition:  [facility] with [facility]   Recommendations for Outpatient Follow-up:  1. Follow up with Cardiology in 1-2 weeks 2. Please obtain BMP in one week    Home Health: Yes  Equipment/Devices: Rolling walker  Discharge Condition: Fair CODE STATUS: FULL Diet recommendation: Cardiac  Brief/Interim Summary: [name] is a 84 y.o. F with HTN, CAD s/p PCI 2012, dCHF, DM, severe DDD of back, pAF on Xarelto who was admitted with fatigue, found to be very anemic to 8 g/dL from baseline 11-12 g/dL, required blood transfusion as well as Afib with RVR requiring diltiazem drip.     Discharge Diagnoses:   Acute on chronic iron deficiency anemia Suspected chronic GI bleed Anticoagulant held.  GI consulted.  Hgb stable.  Underwent EGD this hospitalization, only erosive gastropathy, antiocoagulant restarted on 8/11.  Hgb stable around 9 g/dL at discharge.   -Continue PPI and follow up with [facility] in 4-6 weeks   Atrial fibrillation with RVR CHA2DS2-Vasc 5, and Xarelto initially held.  Rates difficult to control.  Amiodarone and metoprolol continued, and diltiazem titrated up.  Diuresed and rates improved.   Underwent DCCV on 8/15, failed x3.  HR stable in 80s-90s at rest, goes up to 110s-120s with exertion. -Follow up with Cardiology in 1-2 weeks   Acute on chronic diastolic CHF EF 98-33%, diuresed with IV Lasix and restarted on home Torsemide.  Appears euvolemic day of discharge.  Diabetes BG well controlled with diet.  Chronic kidney disease stage III Creatinine stable, new baseline 1-1.2  Hypertension Coronary disease secondary prevention Blood pressure normotensive.       Discharge Instructions  Discharge Instructions    Diet - low sodium heart healthy    Complete by:  As directed    Discharge instructions   Complete by:  As directed    From Dr. Loleta Books: You were admitted for trouble breathing. We found this was related to a congestive heart failure flare and also your Afib and anemia (low blood levels).  For your heart failure, you were diuresed with IV lasix. We have changed your diuretic from Lasix to torsemide 20 mg twice daily Have [facility] check your basic metabolic panel (kidney function) in 1 week Take potassium 10 mEq every day with your diuretic.  For your Afib: Your new dose of amiodarone is 200 mg twice daily Follow up with Dr. Einar Gip in 1-2 weeks, call his office for an appointment if you don't have one already. Your metoprolol should continue Stop Irbesartan Your diltiazem dose has been increased to 180 mg daily.   For your anemia: Your blood level was low. You were transfused 1 unit of blood. Dr. Lyndel Safe from [facility] did endsocopy which showed only stomach irritation (gastritis). You should make sure to take a acid suppressant like pantoprazole/Protonix and follow up with Dr. Steve Rattler office in 4-6 weeks. If you don't have an appointment with them yet, call their office at [phone] to schedule Avoid any NSAID pain relievers (ibuprofen, naproxen, aleve, motrin, Advil)   Increase activity slowly   Complete by:  As directed      Allergies as of 11/11/2017      Reactions   Dilaudid [hydromorphone Hcl] Other (See Comments)   Caused patient psychological disturbances    Aspirin    On xalerto   Losartan  Other (See Comments)   Per MAR   Bactrim [sulfamethoxazole-trimethoprim] Rash   Sulfur Rash      Medication List    STOP taking these medications   amoxicillin-clavulanate 875-125 MG tablet Commonly known as:  AUGMENTIN   furosemide 40 MG tablet Commonly known as:  LASIX   irbesartan 300 MG tablet Commonly known as:  AVAPRO   nitroGLYCERIN 0.4 MG SL tablet Commonly known as:   NITROSTAT     TAKE these medications   amiodarone 200 MG tablet Commonly known as:  PACERONE Take 1 tablet (200 mg total) by mouth 2 (two) times daily. What changed:    medication strength  how much to take   clotrimazole-betamethasone cream Commonly known as:  LOTRISONE Apply 1 application topically 2 (two) times daily as needed (itching). Affected area   diltiazem 180 MG 24 hr capsule Commonly known as:  CARDIZEM CD Take 1 capsule (180 mg total) by mouth daily. What changed:    medication strength  how much to take   dorzolamide 2 % ophthalmic solution Commonly known as:  TRUSOPT Place 1 drop into the left eye 2 (two) times daily.   ergocalciferol 50000 units capsule Commonly known as:  VITAMIN D2 Take 50,000 Units by mouth once a week.   hydrocortisone cream 1 % Apply topically 3 (three) times daily as needed for itching.   latanoprost 0.005 % ophthalmic solution Commonly known as:  XALATAN Place 1 drop into the left eye at bedtime.   loratadine 10 MG tablet Commonly known as:  CLARITIN Take 10 mg by mouth daily.   methocarbamol 500 MG tablet Commonly known as:  ROBAXIN Take 500 mg by mouth 4 (four) times daily as needed (pain).   metoprolol tartrate 25 MG tablet Commonly known as:  LOPRESSOR Take 25 mg by mouth 2 (two) times daily.   pantoprazole 40 MG tablet Commonly known as:  PROTONIX Take 1 tablet (40 mg total) by mouth daily at 6 (six) AM. Start taking on:  11/12/2017   potassium chloride 10 MEQ tablet Commonly known as:  K-DUR Take 1 tablet (10 mEq total) by mouth daily.   PRESERVISION AREDS 2 Caps Take 1 capsule by mouth 2 (two) times daily.   rivaroxaban 20 MG Tabs tablet Commonly known as:  XARELTO Take 20 mg by mouth daily with supper. What changed:  Another medication with the same name was removed. Continue taking this medication, and follow the directions you see here.   rosuvastatin 5 MG tablet Commonly known as:  CRESTOR Take 5  mg by mouth at bedtime.   SYSTANE BALANCE OP Apply 1 drop to eye daily as needed (dry eyes).   torsemide 20 MG tablet Commonly known as:  DEMADEX Take 1 tablet (20 mg total) by mouth 2 (two) times daily.      Follow-up Information    [name], MD Follow up on 11/21/2017.   Specialty:  Cardiology Why:  3:30 PM Contact information: [address] [place] [address] [phone]          Allergies  Allergen Reactions  . Dilaudid [Hydromorphone Hcl] Other (See Comments)    Caused patient psychological disturbances   . Aspirin     On xalerto  . Losartan Other (See Comments)    Per MAR  . Bactrim [Sulfamethoxazole-Trimethoprim] Rash  . Sulfur Rash    Consultations:  Cardiology  Gastroenterology   Procedures/Studies: Dg Chest 1 View  Result Date: 11/02/2017 CLINICAL DATA:  84 year old female with dizziness  and anemia EXAM: CHEST  1 VIEW COMPARISON:  Prior chest x-ray 01/21/2015 FINDINGS: Stable cardiac and mediastinal contours. Low inspiratory volumes with chronic atelectasis versus scarring in the lingula. No focal airspace consolidation, pulmonary edema, large pleural effusion or pneumothorax. No acute osseous abnormality. Bilateral glenohumeral joint osteoarthritis. IMPRESSION: Stable chest x-ray without evidence of acute cardiopulmonary process. Electronically Signed   By: Jacqulynn Cadet M.D.   On: 11/02/2017 18:46   Ct Abdomen Pelvis W Contrast  Result Date: 10/27/2017 CLINICAL DATA:  Abdominal pain, diarrhea and weakness. EXAM: CT ABDOMEN AND PELVIS WITH CONTRAST TECHNIQUE: Multidetector CT imaging of the abdomen and pelvis was performed using the standard protocol following bolus administration of intravenous contrast. CONTRAST:  169mL OMNIPAQUE IOHEXOL 300 MG/ML  SOLN COMPARISON:  12/04/2010 FINDINGS: Lower chest: The heart size is enlarged. Calcifications within the thoracic aorta, lad coronary artery noted. No acute findings identified. Hepatobiliary: Stable  partially exophytic lesion within segment 2 of the liver measures 2.2 cm, image 21/3. Also unchanged is a focal area of low attenuation within the posterior aspect of segment 8 measuring 1 cm, image 21/3. There is a low-density structure within segment 5 adjacent to the gallbladder fossa measuring 1.6 cm. Not seen on previous exam. This is indeterminate. Previous cholecystectomy. No significant biliary dilatation. Pancreas: Unremarkable. No pancreatic ductal dilatation or surrounding inflammatory changes. Spleen: Normal in size without focal abnormality. Adrenals/Urinary Tract: Normal adrenal glands. Bilateral renal cortical scarring and lobulation is identified and appears similar to the previous exam. No kidney mass or hydronephrosis identified. Stomach/Bowel: The stomach appears normal. The small bowel loops are unremarkable. The appendix is visualized and appears normal. There is extensive sigmoid diverticulosis identified. Mild wall thickening involving the sigmoid colon is identified which may reflect low-grade, uncomplicated sigmoid diverticulitis. No perforation or abscess identified. Vascular/Lymphatic: Aortic atherosclerosis. No aneurysm. No abdominal or pelvic adenopathy. Reproductive: Status post hysterectomy. No adnexal masses. Other: No free fluid or fluid collections identified. Musculoskeletal: Mild lumbar scoliosis and multi level spondylosis. IMPRESSION: 1. There is extensive sigmoid diverticulosis with mild wall thickening suspicious for uncomplicated acute diverticulitis. No perforation or abscess formation identified. 2. There is a new low-attenuation structure within segment 5 of the liver adjacent to the gallbladder fossa, favor focal fatty deposition. Suggest followup imaging in 3-6 months to ensure stability. At this time the study of choice would be a contrast enhanced liver protocol MRI. The other low-density liver lesions are unchanged from 12/04/2010 and are likely to represent benign  abnormalities. 3.  Aortic Atherosclerosis (ICD10-I70.0). 4. Coronary artery atherosclerotic calcifications noted. Electronically Signed   By: Kerby Moors M.D.   On: 10/27/2017 13:01   Echo 8/11 LV EF: 60% - 65%  ------------------------------------------------------------------- Indications: CHF - 428.0.  ------------------------------------------------------------------- History: PMH: Atrial fibrillation. Coronary artery disease. Mitral valve disease. PMH: Myocardial infarction. Risk factors: Hypertension. Diabetes mellitus. Dyslipidemia.  ------------------------------------------------------------------- Study Conclusions  - Left ventricle: The cavity size was normal. Systolic function was normal. The estimated ejection fraction was in the range of 60% to 65%. Unable to assess diastolic function due to Afib. Wall motion was normal; there were no regional wall motion abnormalities. - Mitral valve: Moderately dilated annulus. There was moderate regurgitation. - Left atrium: The atrium was severely dilated. - Right ventricle: Systolic function was mildly reduced. - Right atrium: The atrium was severely dilated. - Tricuspid valve: Mildly dilated annulus. There was mild-moderate regurgitation. - Pulmonary arteries: PA peak pressure: 45 mm Hg (S).    EGD 8/9 Impression:  - Non-bleeding erosive  gastropathy. Biopsied. - The examination was otherwise normal Recommendations: - Return patient to hospital ward for ongoing care. - Resume previous diet. - resume Xeralto from 8/11 onwards. - Await pathology results. - Avoid ibuprofen, naproxen, or other non-steroidal anti-inflammatory drugs. - Use Protonix (pantoprazole) 40 mg PO daily. - Return to GI clinic in 4-6 weeks. - Will sign off for now.Pl call with any questions.        Subjective: Feeling well today.  No chest pain, dyspena, confusion, weakness, cough, palpitations, dizziness,  orthostasis.  Discharge Exam: Vitals:   11/11/17 0241 11/11/17 0741  BP: 98/64 116/79  Pulse: (!) 110 93  Resp: (!) 23 (!) 21  Temp: 98.5 F (36.9 C) 98.5 F (36.9 C)  SpO2: 94% 100%   Vitals:   11/10/17 2203 11/11/17 0001 11/11/17 0241 11/11/17 0741  BP: 110/70 120/75 98/64 116/79  Pulse: (!) 108 97 (!) 110 93  Resp:  20 (!) 23 (!) 21  Temp:  99.1 F (37.3 C) 98.5 F (36.9 C) 98.5 F (36.9 C)  TempSrc:  Oral Oral Oral  SpO2:  95% 94% 100%  Weight:      Height:        General: Pt is alert, awake, not in acute distress, able to sit at side of bed independently, interactive Cardiovascular: tachycardic, irregular, S1/S2 +, no rubs, no gallops Respiratory: CTA bilaterally, no wheezing, no rhonchi Abdominal: Soft, NT, ND, bowel sounds + Extremities: no edema, no cyanosis    The results of significant diagnostics from this hospitalization (including imaging, microbiology, ancillary and laboratory) are listed below for reference.     Microbiology: Recent Results (from the past 240 hour(s))  MRSA PCR Screening     Status: None   Collection Time: 11/02/17 11:24 PM  Result Value Ref Range Status   MRSA by PCR NEGATIVE NEGATIVE Final    Comment:        The GeneXpert MRSA Assay (FDA approved for NASAL specimens only), is one component of a comprehensive MRSA colonization surveillance program. It is not intended to diagnose MRSA infection nor to guide or monitor treatment for MRSA infections. Performed at [facility], [place] [address] [place],  [address]      Labs: BNP (last 3 results) Recent Labs    11/06/17 0243  BNP 235.3*   Basic Metabolic Panel: Recent Labs  Lab 11/05/17 0249 11/06/17 0243 11/07/17 0206 11/08/17 0311 11/09/17 0242  NA 138 138 138 136 137  K 4.9 4.6 3.8 3.5 3.9  CL 102 99 97* 95* 96*  CO2 28 30 31 31  33*  GLUCOSE 95 113* 108* 112* 116*  BUN 15 23 23 21 20   CREATININE 1.08* 1.29* 1.17* 1.22* 1.08*  CALCIUM 8.3* 8.5*  8.1* 8.3* 8.7*   Liver Function Tests: No results for input(s): AST, ALT, ALKPHOS, BILITOT, PROT, ALBUMIN in the last 168 hours. No results for input(s): LIPASE, AMYLASE in the last 168 hours. No results for input(s): AMMONIA in the last 168 hours. CBC: Recent Labs  Lab 11/05/17 0249 11/06/17 0243 11/07/17 0206 11/08/17 0311 11/09/17 0242  WBC 6.7 9.1 6.2 5.4 5.5  HGB 9.6* 9.0* 8.5* 8.9* 9.1*  HCT 32.4* 29.6* 28.8* 30.1* 31.0*  MCV 81.2 79.8 80.7 80.1 79.1  PLT 248 243 257 250 272   Cardiac Enzymes: No results for input(s): CKTOTAL, CKMB, CKMBINDEX, TROPONINI in the last 168 hours. BNP: Invalid input(s): POCBNP CBG: Recent Labs  Lab 11/11/17 0754  GLUCAP 97   D-Dimer No  results for input(s): DDIMER in the last 72 hours. Hgb A1c No results for input(s): HGBA1C in the last 72 hours. Lipid Profile No results for input(s): CHOL, HDL, LDLCALC, TRIG, CHOLHDL, LDLDIRECT in the last 72 hours. Thyroid function studies No results for input(s): TSH, T4TOTAL, T3FREE, THYROIDAB in the last 72 hours.  Invalid input(s): FREET3 Anemia work up No results for input(s): VITAMINB12, FOLATE, FERRITIN, TIBC, IRON, RETICCTPCT in the last 72 hours. Urinalysis    Component Value Date/Time   COLORURINE YELLOW 01/25/2015 2122   APPEARANCEUR CLEAR 01/25/2015 2122   LABSPEC 1.012 01/25/2015 2122   PHURINE 5.5 01/25/2015 2122   GLUCOSEU NEGATIVE 01/25/2015 2122   HGBUR NEGATIVE 01/25/2015 2122   BILIRUBINUR NEGATIVE 01/25/2015 2122   KETONESUR NEGATIVE 01/25/2015 2122   PROTEINUR NEGATIVE 01/25/2015 2122   UROBILINOGEN 0.2 01/25/2015 2122   NITRITE NEGATIVE 01/25/2015 2122   LEUKOCYTESUR NEGATIVE 01/25/2015 2122   Sepsis Labs Invalid input(s): PROCALCITONIN,  WBC,  LACTICIDVEN Microbiology Recent Results (from the past 240 hour(s))  MRSA PCR Screening     Status: None   Collection Time: 11/02/17 11:24 PM  Result Value Ref Range Status   MRSA by PCR NEGATIVE NEGATIVE Final     Comment:        The GeneXpert MRSA Assay (FDA approved for NASAL specimens only), is one component of a comprehensive MRSA colonization surveillance program. It is not intended to diagnose MRSA infection nor to guide or monitor treatment for MRSA infections. Performed at [facility], [place] [address], [place], [place] [address]      Time coordinating discharge: 40 minutes       SIGNED:   Edwin Dada, MD  [facility] 11/11/2017, 10:10 AM

## 2017-11-11 NOTE — Discharge Instructions (Signed)

## 2017-11-11 NOTE — [place] FL2 (Signed)
[place] MEDICAID FL2 LEVEL OF CARE SCREENING TOOL     IDENTIFICATION  Patient Name: [name] Birthdate: 1933-09-24 Sex: female Admission Date (Current Location): 11/02/2017  [facility] and [place] Number:  Herbalist and Address:  [facility] [place], [place] [address], [place], [place] [address]      Provider Number: [id number]  Attending Physician Name and Address:  Edwin Dada, *  Relative Name and Phone Number:  Florrie Ramires, [phone]    Current Level of Care: Hospital Recommended Level of Care: [place] Prior Approval Number:    Date Approved/Denied:   PASRR Number: [id number]  Discharge Plan: Home([facility])    Current Diagnoses: Patient Active Problem List   Diagnosis Date Noted  . Atrial fibrillation with RVR ([place]) 11/02/2017  . Symptomatic anemia 11/02/2017  . Atrial fibrillation ([place]) 01/21/2015  . Diarrhea 04/24/2014  . S/P laparoscopic cholecystectomy 02/26/2011  . Hypertension 02/05/2011  . Myocardial infarction ([place]) 02/05/2011  . Hyperlipidemia 02/05/2011  . GERD (gastroesophageal reflux disease) 02/05/2011  . Anxiety 02/05/2011  . Depression 02/05/2011  . Hepatic steatosis 02/05/2011  . Pulmonary hypertension ([place]) 02/05/2011    Orientation RESPIRATION BLADDER Height & Weight     Self, Time, Situation, Place  Normal Continent Weight: 207 lb (93.9 kg) Height:  5\' 3"  (160 cm)  BEHAVIORAL SYMPTOMS/MOOD NEUROLOGICAL BOWEL NUTRITION STATUS      Continent Diet(heart healthy)  AMBULATORY STATUS COMMUNICATION OF NEEDS Skin   Limited Assist Verbally                         Personal Care Assistance Level of Assistance  Bathing, Feeding, Dressing Bathing Assistance: Limited assistance Feeding assistance: Independent Dressing Assistance: Limited assistance     Functional Limitations Info  Sight, Hearing, Speech Sight Info: Adequate Hearing Info: Adequate Speech Info:  Adequate    SPECIAL CARE FACTORS FREQUENCY  PT (By licensed PT), OT (By licensed OT)     PT Frequency: 3x wk OT Frequency: 3x wk            Contractures Contractures Info: Not present    Additional Factors Info  Code Status, Allergies Code Status Info: full code Allergies Info: DILAUDID HYDROMORPHONE HCL, ASPIRIN, LOSARTAN, BACTRIM SULFAMETHOXAZOLE-TRIMETHOPRIM, SULFUR            Current Medications (11/11/2017):  This is the current hospital active medication list Current Facility-Administered Medications  Medication Dose Route Frequency Provider Last Rate Last Dose  . acetaminophen (TYLENOL) tablet 650 mg  650 mg Oral Q6H PRN Toy Baker, MD       Or  . acetaminophen (TYLENOL) suppository 650 mg  650 mg Rectal Q6H PRN Doutova, Anastassia, MD      . amiodarone (PACERONE) tablet 400 mg  400 mg Oral BID [name], MD   400 mg at 11/11/17 1027  . diltiazem (CARDIZEM CD) 24 hr capsule 180 mg  180 mg Oral Daily Domenic Polite, MD   180 mg at 11/11/17 1028  . dorzolamide (TRUSOPT) 2 % ophthalmic solution 1 drop  1 drop Left Eye BID Toy Baker, MD   1 drop at 11/11/17 1030  . HYDROcodone-acetaminophen (NORCO/VICODIN) 5-325 MG per tablet 1-2 tablet  1-2 tablet Oral Q4H PRN Doutova, Anastassia, MD      . hydrocortisone cream 1 %   Topical TID PRN Domenic Polite, MD      . latanoprost (XALATAN) 0.005 % ophthalmic solution 1 drop  1 drop Left Eye  Francena Hanly, MD   1 drop at 11/10/17 2206  . metoprolol tartrate (LOPRESSOR) tablet 25 mg  25 mg Oral BID [name], MD   25 mg at 11/11/17 1028  . ondansetron (ZOFRAN) tablet 4 mg  4 mg Oral Q6H PRN Doutova, Anastassia, MD       Or  . ondansetron (ZOFRAN) injection 4 mg  4 mg Intravenous Q6H PRN Doutova, Anastassia, MD   4 mg at 11/04/17 1845  . pantoprazole (PROTONIX) EC tablet 40 mg  40 mg Oral Q0600 Jackquline Denmark, MD   40 mg at 11/11/17 0962  . rivaroxaban (XARELTO) tablet 20 mg  20 mg Oral Q supper Domenic Polite, MD   20 mg at 11/10/17 1702  . rosuvastatin (CRESTOR) tablet 5 mg  5 mg Oral QHS Toy Baker, MD   5 mg at 11/10/17 2203  . torsemide (DEMADEX) tablet 20 mg  20 mg Oral BID Patwardhan, Manish J, MD   20 mg at 11/11/17 0805     Discharge Medications: STOP taking these medications   amoxicillin-clavulanate 875-125 MG tablet Commonly known as:  AUGMENTIN   furosemide 40 MG tablet Commonly known as:  LASIX   irbesartan 300 MG tablet Commonly known as:  AVAPRO   nitroGLYCERIN 0.4 MG SL tablet Commonly known as:  NITROSTAT     TAKE these medications   amiodarone 200 MG tablet Commonly known as:  PACERONE Take 1 tablet (200 mg total) by mouth 2 (two) times daily. What changed:    medication strength  how much to take   clotrimazole-betamethasone cream Commonly known as:  LOTRISONE Apply 1 application topically 2 (two) times daily as needed (itching). Affected area   diltiazem 180 MG 24 hr capsule Commonly known as:  CARDIZEM CD Take 1 capsule (180 mg total) by mouth daily. What changed:    medication strength  how much to take   dorzolamide 2 % ophthalmic solution Commonly known as:  TRUSOPT Place 1 drop into the left eye 2 (two) times daily.   ergocalciferol 50000 units capsule Commonly known as:  VITAMIN D2 Take 50,000 Units by mouth once a week.   hydrocortisone cream 1 % Apply topically 3 (three) times daily as needed for itching.   latanoprost 0.005 % ophthalmic solution Commonly known as:  XALATAN Place 1 drop into the left eye at bedtime.   loratadine 10 MG tablet Commonly known as:  CLARITIN Take 10 mg by mouth daily.   methocarbamol 500 MG tablet Commonly known as:  ROBAXIN Take 500 mg by mouth 4 (four) times daily as needed (pain).   metoprolol tartrate 25 MG tablet Commonly known as:  LOPRESSOR Take 25 mg by mouth 2 (two) times daily.   pantoprazole 40 MG tablet Commonly known as:  PROTONIX Take 1 tablet (40  mg total) by mouth daily at 6 (six) AM. Start taking on:  11/12/2017   potassium chloride 10 MEQ tablet Commonly known as:  K-DUR Take 1 tablet (10 mEq total) by mouth daily.   PRESERVISION AREDS 2 Caps Take 1 capsule by mouth 2 (two) times daily.   rivaroxaban 20 MG Tabs tablet Commonly known as:  XARELTO Take 20 mg by mouth daily with supper. What changed:  Another medication with the same name was removed. Continue taking this medication, and follow the directions you see here.   rosuvastatin 5 MG tablet Commonly known as:  CRESTOR Take 5 mg by mouth at bedtime.   SYSTANE BALANCE OP Apply 1  drop to eye daily as needed (dry eyes).   torsemide 20 MG tablet Commonly known as:  DEMADEX Take 1 tablet (20 mg total) by mouth 2 (two) times daily.        Relevant Imaging Results:  Relevant Lab Results:   Additional Information SS# [id number]  Wende Neighbors, LCSW

## 2017-11-11 NOTE — Plan of Care (Signed)
  Problem: Health Behavior/Discharge Planning: Goal: Ability to manage health-related needs will improve Outcome: Adequate for Discharge   Problem: Clinical Measurements: Goal: Ability to maintain clinical measurements within normal limits will improve Outcome: Adequate for Discharge Goal: Will remain free from infection Outcome: Adequate for Discharge Goal: Diagnostic test results will improve Outcome: Adequate for Discharge Goal: Respiratory complications will improve Outcome: Adequate for Discharge Goal: Cardiovascular complication will be avoided Outcome: Adequate for Discharge   Problem: Nutrition: Goal: Adequate nutrition will be maintained Outcome: Adequate for Discharge   Problem: Coping: Goal: Level of anxiety will decrease Outcome: Adequate for Discharge   Problem: Elimination: Goal: Will not experience complications related to bowel motility Outcome: Adequate for Discharge Goal: Will not experience complications related to urinary retention Outcome: Adequate for Discharge   Problem: Pain Managment: Goal: General experience of comfort will improve Outcome: Adequate for Discharge   Problem: Safety: Goal: Ability to remain free from injury will improve Outcome: Adequate for Discharge   Problem: Skin Integrity: Goal: Risk for impaired skin integrity will decrease Outcome: Adequate for Discharge

## 2017-11-11 NOTE — Progress Notes (Signed)
Written and verbal discharge instructions given to patient and her son. All questions answered. PIVs removed per discharge protocol. All personal belongings returned to patient. Patient transported by CNA via wheelchair to son's waiting car for discharge.

## 2017-11-11 NOTE — Progress Notes (Signed)
Clinical Social Worker facilitated patient discharge including contacting patient family and facility to confirm patient discharge plans.  Clinical information faxed to facility and family agreeable with plan. RN discharge patient without CSW knowledge. CSW contacted RN to please call report to [facility] [phone]) since facility was unaware patient was coming via personal vehicle.   Clinical Social Worker will sign off for now as social work intervention is no longer needed. Please consult [place] again if new need arises.  Rhea Pink, MSW, [place]

## 2017-11-11 NOTE — Care Management Note (Addendum)
Case Management Note  Patient Details  Name: [name] MRN: [id number] Date of Birth: 08-Sep-1933  Subjective/Objective:   Pt admitted with A fib                  Action/Plan:  PTA independent from ALF.  Pt has PCP and denied barriers with obtaining/paying for medications.  CM explained to both pt and son [name] the recommendation of HHPT and 24 hour supervision (SNF if goals are not met).  Pts family are looking at providing adequate supervision if pt returns to ALF (ALF will not provide recommended supervsion) .   ALF informed CM that facility does not have a preference of [place] agency.  Pt will discuss information provided with son and CM will follow back up with pt.   Expected Discharge Date:  11/11/17               Expected Discharge Plan:  Home/Self Care  In-House Referral:     Discharge planning Services  CM Consult  Post Acute Care Choice:    Choice offered to:  Patient  DME Arranged:    DME Agency:     HH Arranged:  PT, OT, RN [place] Agency:  [place]  Status of Service:  In process, will continue to follow  If discussed at Long Length of Stay Meetings, dates discussed:    Additional Comments: 11/11/2017  CM reiterated the recommendation with pt and son regarding 24 hour supervision.  Pt and son also want to return home.  CM contacted by attending - pt deemed safe to discharge home with [facility] based on information provided about care at ALF by CSW. CM offered choice of [place] chosen - agency contacted and referral accepted.  C  Pt already has all needed equipment in the home per son.  Pt will travel back to ALF via private vehicle  11/09/17 Update:  CM spoke with son - son/pt are interested in exploring SNF option as pt does not have 24 hour supervision. CM contacted CSW and requested CSW to follow up with son regarding SNF as pt is nearing discharge readiness.   Barriers to discharge include:  HR remains uncontrolled and medication regimen not yet  established. Pt is scheduled for cardioversion 8/15 [place], [place] 11/11/2017, 11:28 AM

## 2017-11-13 ENCOUNTER — Encounter (HOSPITAL_COMMUNITY): Payer: Self-pay | Admitting: Cardiology

## 2017-11-17 DIAGNOSIS — I10 Essential (primary) hypertension: Secondary | ICD-10-CM | POA: Diagnosis not present

## 2017-11-18 DIAGNOSIS — E1151 Type 2 diabetes mellitus with diabetic peripheral angiopathy without gangrene: Secondary | ICD-10-CM | POA: Diagnosis not present

## 2017-11-18 DIAGNOSIS — I130 Hypertensive heart and chronic kidney disease with heart failure and stage 1 through stage 4 chronic kidney disease, or unspecified chronic kidney disease: Secondary | ICD-10-CM | POA: Diagnosis not present

## 2017-11-18 DIAGNOSIS — D509 Iron deficiency anemia, unspecified: Secondary | ICD-10-CM | POA: Diagnosis not present

## 2017-11-18 DIAGNOSIS — I5033 Acute on chronic diastolic (congestive) heart failure: Secondary | ICD-10-CM | POA: Diagnosis not present

## 2017-11-18 DIAGNOSIS — I2510 Atherosclerotic heart disease of native coronary artery without angina pectoris: Secondary | ICD-10-CM | POA: Diagnosis not present

## 2017-11-18 DIAGNOSIS — Z9181 History of falling: Secondary | ICD-10-CM | POA: Diagnosis not present

## 2017-11-18 DIAGNOSIS — E1122 Type 2 diabetes mellitus with diabetic chronic kidney disease: Secondary | ICD-10-CM | POA: Diagnosis not present

## 2017-11-18 DIAGNOSIS — N183 Chronic kidney disease, stage 3 (moderate): Secondary | ICD-10-CM | POA: Diagnosis not present

## 2017-11-18 DIAGNOSIS — I4891 Unspecified atrial fibrillation: Secondary | ICD-10-CM | POA: Diagnosis not present

## 2017-11-21 DIAGNOSIS — I5033 Acute on chronic diastolic (congestive) heart failure: Secondary | ICD-10-CM | POA: Diagnosis not present

## 2017-11-21 DIAGNOSIS — I130 Hypertensive heart and chronic kidney disease with heart failure and stage 1 through stage 4 chronic kidney disease, or unspecified chronic kidney disease: Secondary | ICD-10-CM | POA: Diagnosis not present

## 2017-11-21 DIAGNOSIS — I482 Chronic atrial fibrillation: Secondary | ICD-10-CM | POA: Diagnosis not present

## 2017-11-21 DIAGNOSIS — Z0189 Encounter for other specified special examinations: Secondary | ICD-10-CM | POA: Diagnosis not present

## 2017-11-21 DIAGNOSIS — I2510 Atherosclerotic heart disease of native coronary artery without angina pectoris: Secondary | ICD-10-CM | POA: Diagnosis not present

## 2017-11-21 DIAGNOSIS — N183 Chronic kidney disease, stage 3 (moderate): Secondary | ICD-10-CM | POA: Diagnosis not present

## 2017-11-21 DIAGNOSIS — E1122 Type 2 diabetes mellitus with diabetic chronic kidney disease: Secondary | ICD-10-CM | POA: Diagnosis not present

## 2017-11-21 DIAGNOSIS — I10 Essential (primary) hypertension: Secondary | ICD-10-CM | POA: Diagnosis not present

## 2017-11-21 DIAGNOSIS — E1151 Type 2 diabetes mellitus with diabetic peripheral angiopathy without gangrene: Secondary | ICD-10-CM | POA: Diagnosis not present

## 2017-11-22 DIAGNOSIS — I5033 Acute on chronic diastolic (congestive) heart failure: Secondary | ICD-10-CM | POA: Diagnosis not present

## 2017-11-22 DIAGNOSIS — E1151 Type 2 diabetes mellitus with diabetic peripheral angiopathy without gangrene: Secondary | ICD-10-CM | POA: Diagnosis not present

## 2017-11-22 DIAGNOSIS — E1122 Type 2 diabetes mellitus with diabetic chronic kidney disease: Secondary | ICD-10-CM | POA: Diagnosis not present

## 2017-11-22 DIAGNOSIS — I130 Hypertensive heart and chronic kidney disease with heart failure and stage 1 through stage 4 chronic kidney disease, or unspecified chronic kidney disease: Secondary | ICD-10-CM | POA: Diagnosis not present

## 2017-11-22 DIAGNOSIS — I2510 Atherosclerotic heart disease of native coronary artery without angina pectoris: Secondary | ICD-10-CM | POA: Diagnosis not present

## 2017-11-22 DIAGNOSIS — N183 Chronic kidney disease, stage 3 (moderate): Secondary | ICD-10-CM | POA: Diagnosis not present

## 2017-11-23 DIAGNOSIS — I2510 Atherosclerotic heart disease of native coronary artery without angina pectoris: Secondary | ICD-10-CM | POA: Diagnosis not present

## 2017-11-23 DIAGNOSIS — E1151 Type 2 diabetes mellitus with diabetic peripheral angiopathy without gangrene: Secondary | ICD-10-CM | POA: Diagnosis not present

## 2017-11-23 DIAGNOSIS — I130 Hypertensive heart and chronic kidney disease with heart failure and stage 1 through stage 4 chronic kidney disease, or unspecified chronic kidney disease: Secondary | ICD-10-CM | POA: Diagnosis not present

## 2017-11-23 DIAGNOSIS — E1122 Type 2 diabetes mellitus with diabetic chronic kidney disease: Secondary | ICD-10-CM | POA: Diagnosis not present

## 2017-11-23 DIAGNOSIS — I5033 Acute on chronic diastolic (congestive) heart failure: Secondary | ICD-10-CM | POA: Diagnosis not present

## 2017-11-23 DIAGNOSIS — N183 Chronic kidney disease, stage 3 (moderate): Secondary | ICD-10-CM | POA: Diagnosis not present

## 2017-11-24 DIAGNOSIS — N183 Chronic kidney disease, stage 3 (moderate): Secondary | ICD-10-CM | POA: Diagnosis not present

## 2017-11-24 DIAGNOSIS — E1122 Type 2 diabetes mellitus with diabetic chronic kidney disease: Secondary | ICD-10-CM | POA: Diagnosis not present

## 2017-11-24 DIAGNOSIS — I2510 Atherosclerotic heart disease of native coronary artery without angina pectoris: Secondary | ICD-10-CM | POA: Diagnosis not present

## 2017-11-24 DIAGNOSIS — I5033 Acute on chronic diastolic (congestive) heart failure: Secondary | ICD-10-CM | POA: Diagnosis not present

## 2017-11-24 DIAGNOSIS — I130 Hypertensive heart and chronic kidney disease with heart failure and stage 1 through stage 4 chronic kidney disease, or unspecified chronic kidney disease: Secondary | ICD-10-CM | POA: Diagnosis not present

## 2017-11-24 DIAGNOSIS — E1151 Type 2 diabetes mellitus with diabetic peripheral angiopathy without gangrene: Secondary | ICD-10-CM | POA: Diagnosis not present

## 2017-11-25 DIAGNOSIS — I2510 Atherosclerotic heart disease of native coronary artery without angina pectoris: Secondary | ICD-10-CM | POA: Diagnosis not present

## 2017-11-25 DIAGNOSIS — N183 Chronic kidney disease, stage 3 (moderate): Secondary | ICD-10-CM | POA: Diagnosis not present

## 2017-11-25 DIAGNOSIS — I5033 Acute on chronic diastolic (congestive) heart failure: Secondary | ICD-10-CM | POA: Diagnosis not present

## 2017-11-25 DIAGNOSIS — I130 Hypertensive heart and chronic kidney disease with heart failure and stage 1 through stage 4 chronic kidney disease, or unspecified chronic kidney disease: Secondary | ICD-10-CM | POA: Diagnosis not present

## 2017-11-25 DIAGNOSIS — E1151 Type 2 diabetes mellitus with diabetic peripheral angiopathy without gangrene: Secondary | ICD-10-CM | POA: Diagnosis not present

## 2017-11-25 DIAGNOSIS — E1122 Type 2 diabetes mellitus with diabetic chronic kidney disease: Secondary | ICD-10-CM | POA: Diagnosis not present

## 2017-11-29 DIAGNOSIS — I4891 Unspecified atrial fibrillation: Secondary | ICD-10-CM | POA: Diagnosis not present

## 2017-11-29 DIAGNOSIS — I10 Essential (primary) hypertension: Secondary | ICD-10-CM | POA: Diagnosis not present

## 2017-11-29 DIAGNOSIS — K922 Gastrointestinal hemorrhage, unspecified: Secondary | ICD-10-CM | POA: Diagnosis not present

## 2017-11-29 DIAGNOSIS — E119 Type 2 diabetes mellitus without complications: Secondary | ICD-10-CM | POA: Diagnosis not present

## 2017-11-30 DIAGNOSIS — E1122 Type 2 diabetes mellitus with diabetic chronic kidney disease: Secondary | ICD-10-CM | POA: Diagnosis not present

## 2017-11-30 DIAGNOSIS — N183 Chronic kidney disease, stage 3 (moderate): Secondary | ICD-10-CM | POA: Diagnosis not present

## 2017-11-30 DIAGNOSIS — I5033 Acute on chronic diastolic (congestive) heart failure: Secondary | ICD-10-CM | POA: Diagnosis not present

## 2017-11-30 DIAGNOSIS — I130 Hypertensive heart and chronic kidney disease with heart failure and stage 1 through stage 4 chronic kidney disease, or unspecified chronic kidney disease: Secondary | ICD-10-CM | POA: Diagnosis not present

## 2017-11-30 DIAGNOSIS — E1151 Type 2 diabetes mellitus with diabetic peripheral angiopathy without gangrene: Secondary | ICD-10-CM | POA: Diagnosis not present

## 2017-11-30 DIAGNOSIS — I2510 Atherosclerotic heart disease of native coronary artery without angina pectoris: Secondary | ICD-10-CM | POA: Diagnosis not present

## 2017-12-01 DIAGNOSIS — N183 Chronic kidney disease, stage 3 (moderate): Secondary | ICD-10-CM | POA: Diagnosis not present

## 2017-12-01 DIAGNOSIS — E1151 Type 2 diabetes mellitus with diabetic peripheral angiopathy without gangrene: Secondary | ICD-10-CM | POA: Diagnosis not present

## 2017-12-01 DIAGNOSIS — I5033 Acute on chronic diastolic (congestive) heart failure: Secondary | ICD-10-CM | POA: Diagnosis not present

## 2017-12-01 DIAGNOSIS — I2510 Atherosclerotic heart disease of native coronary artery without angina pectoris: Secondary | ICD-10-CM | POA: Diagnosis not present

## 2017-12-01 DIAGNOSIS — I130 Hypertensive heart and chronic kidney disease with heart failure and stage 1 through stage 4 chronic kidney disease, or unspecified chronic kidney disease: Secondary | ICD-10-CM | POA: Diagnosis not present

## 2017-12-01 DIAGNOSIS — E1122 Type 2 diabetes mellitus with diabetic chronic kidney disease: Secondary | ICD-10-CM | POA: Diagnosis not present

## 2017-12-02 DIAGNOSIS — E1122 Type 2 diabetes mellitus with diabetic chronic kidney disease: Secondary | ICD-10-CM | POA: Diagnosis not present

## 2017-12-02 DIAGNOSIS — I5033 Acute on chronic diastolic (congestive) heart failure: Secondary | ICD-10-CM | POA: Diagnosis not present

## 2017-12-02 DIAGNOSIS — I130 Hypertensive heart and chronic kidney disease with heart failure and stage 1 through stage 4 chronic kidney disease, or unspecified chronic kidney disease: Secondary | ICD-10-CM | POA: Diagnosis not present

## 2017-12-02 DIAGNOSIS — E1151 Type 2 diabetes mellitus with diabetic peripheral angiopathy without gangrene: Secondary | ICD-10-CM | POA: Diagnosis not present

## 2017-12-02 DIAGNOSIS — I2510 Atherosclerotic heart disease of native coronary artery without angina pectoris: Secondary | ICD-10-CM | POA: Diagnosis not present

## 2017-12-02 DIAGNOSIS — N183 Chronic kidney disease, stage 3 (moderate): Secondary | ICD-10-CM | POA: Diagnosis not present

## 2017-12-06 DIAGNOSIS — I5033 Acute on chronic diastolic (congestive) heart failure: Secondary | ICD-10-CM | POA: Diagnosis not present

## 2017-12-06 DIAGNOSIS — N183 Chronic kidney disease, stage 3 (moderate): Secondary | ICD-10-CM | POA: Diagnosis not present

## 2017-12-06 DIAGNOSIS — E1122 Type 2 diabetes mellitus with diabetic chronic kidney disease: Secondary | ICD-10-CM | POA: Diagnosis not present

## 2017-12-06 DIAGNOSIS — I130 Hypertensive heart and chronic kidney disease with heart failure and stage 1 through stage 4 chronic kidney disease, or unspecified chronic kidney disease: Secondary | ICD-10-CM | POA: Diagnosis not present

## 2017-12-06 DIAGNOSIS — E1151 Type 2 diabetes mellitus with diabetic peripheral angiopathy without gangrene: Secondary | ICD-10-CM | POA: Diagnosis not present

## 2017-12-06 DIAGNOSIS — I2510 Atherosclerotic heart disease of native coronary artery without angina pectoris: Secondary | ICD-10-CM | POA: Diagnosis not present

## 2017-12-07 DIAGNOSIS — E1122 Type 2 diabetes mellitus with diabetic chronic kidney disease: Secondary | ICD-10-CM | POA: Diagnosis not present

## 2017-12-07 DIAGNOSIS — I2510 Atherosclerotic heart disease of native coronary artery without angina pectoris: Secondary | ICD-10-CM | POA: Diagnosis not present

## 2017-12-07 DIAGNOSIS — I5033 Acute on chronic diastolic (congestive) heart failure: Secondary | ICD-10-CM | POA: Diagnosis not present

## 2017-12-07 DIAGNOSIS — N183 Chronic kidney disease, stage 3 (moderate): Secondary | ICD-10-CM | POA: Diagnosis not present

## 2017-12-07 DIAGNOSIS — E1151 Type 2 diabetes mellitus with diabetic peripheral angiopathy without gangrene: Secondary | ICD-10-CM | POA: Diagnosis not present

## 2017-12-07 DIAGNOSIS — I130 Hypertensive heart and chronic kidney disease with heart failure and stage 1 through stage 4 chronic kidney disease, or unspecified chronic kidney disease: Secondary | ICD-10-CM | POA: Diagnosis not present

## 2017-12-08 DIAGNOSIS — I2510 Atherosclerotic heart disease of native coronary artery without angina pectoris: Secondary | ICD-10-CM | POA: Diagnosis not present

## 2017-12-08 DIAGNOSIS — N183 Chronic kidney disease, stage 3 (moderate): Secondary | ICD-10-CM | POA: Diagnosis not present

## 2017-12-08 DIAGNOSIS — I130 Hypertensive heart and chronic kidney disease with heart failure and stage 1 through stage 4 chronic kidney disease, or unspecified chronic kidney disease: Secondary | ICD-10-CM | POA: Diagnosis not present

## 2017-12-08 DIAGNOSIS — E1151 Type 2 diabetes mellitus with diabetic peripheral angiopathy without gangrene: Secondary | ICD-10-CM | POA: Diagnosis not present

## 2017-12-08 DIAGNOSIS — I5033 Acute on chronic diastolic (congestive) heart failure: Secondary | ICD-10-CM | POA: Diagnosis not present

## 2017-12-08 DIAGNOSIS — E1122 Type 2 diabetes mellitus with diabetic chronic kidney disease: Secondary | ICD-10-CM | POA: Diagnosis not present

## 2017-12-09 DIAGNOSIS — E1122 Type 2 diabetes mellitus with diabetic chronic kidney disease: Secondary | ICD-10-CM | POA: Diagnosis not present

## 2017-12-09 DIAGNOSIS — I5033 Acute on chronic diastolic (congestive) heart failure: Secondary | ICD-10-CM | POA: Diagnosis not present

## 2017-12-09 DIAGNOSIS — N183 Chronic kidney disease, stage 3 (moderate): Secondary | ICD-10-CM | POA: Diagnosis not present

## 2017-12-09 DIAGNOSIS — E1151 Type 2 diabetes mellitus with diabetic peripheral angiopathy without gangrene: Secondary | ICD-10-CM | POA: Diagnosis not present

## 2017-12-09 DIAGNOSIS — I2510 Atherosclerotic heart disease of native coronary artery without angina pectoris: Secondary | ICD-10-CM | POA: Diagnosis not present

## 2017-12-09 DIAGNOSIS — I130 Hypertensive heart and chronic kidney disease with heart failure and stage 1 through stage 4 chronic kidney disease, or unspecified chronic kidney disease: Secondary | ICD-10-CM | POA: Diagnosis not present

## 2017-12-12 DIAGNOSIS — I10 Essential (primary) hypertension: Secondary | ICD-10-CM | POA: Diagnosis not present

## 2017-12-15 DIAGNOSIS — I5033 Acute on chronic diastolic (congestive) heart failure: Secondary | ICD-10-CM | POA: Diagnosis not present

## 2017-12-15 DIAGNOSIS — I2510 Atherosclerotic heart disease of native coronary artery without angina pectoris: Secondary | ICD-10-CM | POA: Diagnosis not present

## 2017-12-15 DIAGNOSIS — E1151 Type 2 diabetes mellitus with diabetic peripheral angiopathy without gangrene: Secondary | ICD-10-CM | POA: Diagnosis not present

## 2017-12-15 DIAGNOSIS — I130 Hypertensive heart and chronic kidney disease with heart failure and stage 1 through stage 4 chronic kidney disease, or unspecified chronic kidney disease: Secondary | ICD-10-CM | POA: Diagnosis not present

## 2017-12-15 DIAGNOSIS — E1122 Type 2 diabetes mellitus with diabetic chronic kidney disease: Secondary | ICD-10-CM | POA: Diagnosis not present

## 2017-12-15 DIAGNOSIS — N183 Chronic kidney disease, stage 3 (moderate): Secondary | ICD-10-CM | POA: Diagnosis not present

## 2017-12-16 DIAGNOSIS — N183 Chronic kidney disease, stage 3 (moderate): Secondary | ICD-10-CM | POA: Diagnosis not present

## 2017-12-16 DIAGNOSIS — I5033 Acute on chronic diastolic (congestive) heart failure: Secondary | ICD-10-CM | POA: Diagnosis not present

## 2017-12-16 DIAGNOSIS — I130 Hypertensive heart and chronic kidney disease with heart failure and stage 1 through stage 4 chronic kidney disease, or unspecified chronic kidney disease: Secondary | ICD-10-CM | POA: Diagnosis not present

## 2017-12-16 DIAGNOSIS — E1122 Type 2 diabetes mellitus with diabetic chronic kidney disease: Secondary | ICD-10-CM | POA: Diagnosis not present

## 2017-12-16 DIAGNOSIS — I2510 Atherosclerotic heart disease of native coronary artery without angina pectoris: Secondary | ICD-10-CM | POA: Diagnosis not present

## 2017-12-16 DIAGNOSIS — E1151 Type 2 diabetes mellitus with diabetic peripheral angiopathy without gangrene: Secondary | ICD-10-CM | POA: Diagnosis not present

## 2017-12-20 ENCOUNTER — Ambulatory Visit: Payer: No Typology Code available for payment source | Admitting: Gastroenterology

## 2017-12-21 DIAGNOSIS — I130 Hypertensive heart and chronic kidney disease with heart failure and stage 1 through stage 4 chronic kidney disease, or unspecified chronic kidney disease: Secondary | ICD-10-CM | POA: Diagnosis not present

## 2017-12-21 DIAGNOSIS — N183 Chronic kidney disease, stage 3 (moderate): Secondary | ICD-10-CM | POA: Diagnosis not present

## 2017-12-21 DIAGNOSIS — I5033 Acute on chronic diastolic (congestive) heart failure: Secondary | ICD-10-CM | POA: Diagnosis not present

## 2017-12-21 DIAGNOSIS — E1122 Type 2 diabetes mellitus with diabetic chronic kidney disease: Secondary | ICD-10-CM | POA: Diagnosis not present

## 2017-12-21 DIAGNOSIS — I2510 Atherosclerotic heart disease of native coronary artery without angina pectoris: Secondary | ICD-10-CM | POA: Diagnosis not present

## 2017-12-21 DIAGNOSIS — E1151 Type 2 diabetes mellitus with diabetic peripheral angiopathy without gangrene: Secondary | ICD-10-CM | POA: Diagnosis not present

## 2017-12-22 DIAGNOSIS — I2510 Atherosclerotic heart disease of native coronary artery without angina pectoris: Secondary | ICD-10-CM | POA: Diagnosis not present

## 2017-12-22 DIAGNOSIS — I10 Essential (primary) hypertension: Secondary | ICD-10-CM | POA: Diagnosis not present

## 2017-12-22 DIAGNOSIS — I482 Chronic atrial fibrillation: Secondary | ICD-10-CM | POA: Diagnosis not present

## 2017-12-22 DIAGNOSIS — I5033 Acute on chronic diastolic (congestive) heart failure: Secondary | ICD-10-CM | POA: Diagnosis not present

## 2017-12-23 DIAGNOSIS — N183 Chronic kidney disease, stage 3 (moderate): Secondary | ICD-10-CM | POA: Diagnosis not present

## 2017-12-23 DIAGNOSIS — E1122 Type 2 diabetes mellitus with diabetic chronic kidney disease: Secondary | ICD-10-CM | POA: Diagnosis not present

## 2017-12-23 DIAGNOSIS — I130 Hypertensive heart and chronic kidney disease with heart failure and stage 1 through stage 4 chronic kidney disease, or unspecified chronic kidney disease: Secondary | ICD-10-CM | POA: Diagnosis not present

## 2017-12-23 DIAGNOSIS — I5033 Acute on chronic diastolic (congestive) heart failure: Secondary | ICD-10-CM | POA: Diagnosis not present

## 2018-01-05 DIAGNOSIS — I482 Chronic atrial fibrillation, unspecified: Secondary | ICD-10-CM | POA: Diagnosis not present

## 2018-01-05 DIAGNOSIS — I5032 Chronic diastolic (congestive) heart failure: Secondary | ICD-10-CM | POA: Diagnosis not present

## 2018-01-05 DIAGNOSIS — I2510 Atherosclerotic heart disease of native coronary artery without angina pectoris: Secondary | ICD-10-CM | POA: Diagnosis not present

## 2018-01-05 DIAGNOSIS — I10 Essential (primary) hypertension: Secondary | ICD-10-CM | POA: Diagnosis not present

## 2018-01-20 ENCOUNTER — Other Ambulatory Visit: Payer: Self-pay

## 2018-01-20 ENCOUNTER — Inpatient Hospital Stay (HOSPITAL_COMMUNITY)
Admission: RE | Admit: 2018-01-20 | Discharge: 2018-01-23 | DRG: 811 | Disposition: A | Discharge status: Skilled Nursing Facility | Payer: Medicare Other | Attending: Internal Medicine | Admitting: Internal Medicine

## 2018-01-20 ENCOUNTER — Encounter (HOSPITAL_COMMUNITY): Payer: Self-pay | Admitting: *Deleted

## 2018-01-20 ENCOUNTER — Encounter (HOSPITAL_COMMUNITY): Payer: Self-pay | Admitting: Certified Registered Nurse Anesthetist

## 2018-01-20 ENCOUNTER — Encounter (HOSPITAL_COMMUNITY)
Admission: RE | Disposition: A | Discharge status: Skilled Nursing Facility | Payer: Self-pay | Source: Home / Self Care | Attending: Internal Medicine

## 2018-01-20 DIAGNOSIS — I5032 Chronic diastolic (congestive) heart failure: Secondary | ICD-10-CM | POA: Diagnosis not present

## 2018-01-20 DIAGNOSIS — T471X6A Underdosing of other antacids and anti-gastric-secretion drugs, initial encounter: Secondary | ICD-10-CM | POA: Diagnosis present

## 2018-01-20 DIAGNOSIS — I5033 Acute on chronic diastolic (congestive) heart failure: Secondary | ICD-10-CM | POA: Diagnosis not present

## 2018-01-20 DIAGNOSIS — M1610 Unilateral primary osteoarthritis, unspecified hip: Secondary | ICD-10-CM | POA: Diagnosis present

## 2018-01-20 DIAGNOSIS — Z23 Encounter for immunization: Secondary | ICD-10-CM | POA: Diagnosis not present

## 2018-01-20 DIAGNOSIS — Z9049 Acquired absence of other specified parts of digestive tract: Secondary | ICD-10-CM

## 2018-01-20 DIAGNOSIS — Z79899 Other long term (current) drug therapy: Secondary | ICD-10-CM

## 2018-01-20 DIAGNOSIS — K319 Disease of stomach and duodenum, unspecified: Secondary | ICD-10-CM

## 2018-01-20 DIAGNOSIS — D509 Iron deficiency anemia, unspecified: Secondary | ICD-10-CM | POA: Diagnosis not present

## 2018-01-20 DIAGNOSIS — I2510 Atherosclerotic heart disease of native coronary artery without angina pectoris: Secondary | ICD-10-CM | POA: Diagnosis not present

## 2018-01-20 DIAGNOSIS — I219 Acute myocardial infarction, unspecified: Secondary | ICD-10-CM | POA: Diagnosis present

## 2018-01-20 DIAGNOSIS — E1151 Type 2 diabetes mellitus with diabetic peripheral angiopathy without gangrene: Secondary | ICD-10-CM | POA: Diagnosis present

## 2018-01-20 DIAGNOSIS — D649 Anemia, unspecified: Secondary | ICD-10-CM | POA: Diagnosis present

## 2018-01-20 DIAGNOSIS — K219 Gastro-esophageal reflux disease without esophagitis: Secondary | ICD-10-CM | POA: Diagnosis present

## 2018-01-20 DIAGNOSIS — M4690 Unspecified inflammatory spondylopathy, site unspecified: Secondary | ICD-10-CM | POA: Diagnosis present

## 2018-01-20 DIAGNOSIS — Z9842 Cataract extraction status, left eye: Secondary | ICD-10-CM

## 2018-01-20 DIAGNOSIS — I4891 Unspecified atrial fibrillation: Secondary | ICD-10-CM | POA: Diagnosis present

## 2018-01-20 DIAGNOSIS — M19079 Primary osteoarthritis, unspecified ankle and foot: Secondary | ICD-10-CM | POA: Diagnosis present

## 2018-01-20 DIAGNOSIS — M549 Dorsalgia, unspecified: Secondary | ICD-10-CM | POA: Diagnosis present

## 2018-01-20 DIAGNOSIS — E669 Obesity, unspecified: Secondary | ICD-10-CM | POA: Diagnosis present

## 2018-01-20 DIAGNOSIS — Z7901 Long term (current) use of anticoagulants: Secondary | ICD-10-CM

## 2018-01-20 DIAGNOSIS — I252 Old myocardial infarction: Secondary | ICD-10-CM

## 2018-01-20 DIAGNOSIS — D500 Iron deficiency anemia secondary to blood loss (chronic): Secondary | ICD-10-CM | POA: Diagnosis present

## 2018-01-20 DIAGNOSIS — K2970 Gastritis, unspecified, without bleeding: Secondary | ICD-10-CM | POA: Diagnosis present

## 2018-01-20 DIAGNOSIS — Z8249 Family history of ischemic heart disease and other diseases of the circulatory system: Secondary | ICD-10-CM

## 2018-01-20 DIAGNOSIS — I4811 Longstanding persistent atrial fibrillation: Secondary | ICD-10-CM | POA: Diagnosis not present

## 2018-01-20 DIAGNOSIS — I130 Hypertensive heart and chronic kidney disease with heart failure and stage 1 through stage 4 chronic kidney disease, or unspecified chronic kidney disease: Secondary | ICD-10-CM | POA: Diagnosis present

## 2018-01-20 DIAGNOSIS — Z9114 Patient's other noncompliance with medication regimen: Secondary | ICD-10-CM

## 2018-01-20 DIAGNOSIS — Z9841 Cataract extraction status, right eye: Secondary | ICD-10-CM

## 2018-01-20 DIAGNOSIS — Z90710 Acquired absence of both cervix and uterus: Secondary | ICD-10-CM

## 2018-01-20 DIAGNOSIS — E1165 Type 2 diabetes mellitus with hyperglycemia: Secondary | ICD-10-CM | POA: Diagnosis present

## 2018-01-20 DIAGNOSIS — Z91120 Patient's intentional underdosing of medication regimen due to financial hardship: Secondary | ICD-10-CM

## 2018-01-20 DIAGNOSIS — Z808 Family history of malignant neoplasm of other organs or systems: Secondary | ICD-10-CM

## 2018-01-20 DIAGNOSIS — N183 Chronic kidney disease, stage 3 (moderate): Secondary | ICD-10-CM | POA: Diagnosis present

## 2018-01-20 DIAGNOSIS — E892 Postprocedural hypoparathyroidism: Secondary | ICD-10-CM | POA: Diagnosis present

## 2018-01-20 DIAGNOSIS — I480 Paroxysmal atrial fibrillation: Secondary | ICD-10-CM | POA: Diagnosis present

## 2018-01-20 DIAGNOSIS — I272 Pulmonary hypertension, unspecified: Secondary | ICD-10-CM | POA: Diagnosis present

## 2018-01-20 DIAGNOSIS — Z882 Allergy status to sulfonamides status: Secondary | ICD-10-CM

## 2018-01-20 DIAGNOSIS — E7800 Pure hypercholesterolemia, unspecified: Secondary | ICD-10-CM | POA: Diagnosis present

## 2018-01-20 DIAGNOSIS — Z6832 Body mass index (BMI) 32.0-32.9, adult: Secondary | ICD-10-CM

## 2018-01-20 DIAGNOSIS — Y92009 Unspecified place in unspecified non-institutional (private) residence as the place of occurrence of the external cause: Secondary | ICD-10-CM

## 2018-01-20 DIAGNOSIS — Z888 Allergy status to other drugs, medicaments and biological substances status: Secondary | ICD-10-CM

## 2018-01-20 DIAGNOSIS — I4821 Permanent atrial fibrillation: Secondary | ICD-10-CM | POA: Diagnosis not present

## 2018-01-20 DIAGNOSIS — E1122 Type 2 diabetes mellitus with diabetic chronic kidney disease: Secondary | ICD-10-CM | POA: Diagnosis present

## 2018-01-20 DIAGNOSIS — K529 Noninfective gastroenteritis and colitis, unspecified: Secondary | ICD-10-CM | POA: Diagnosis present

## 2018-01-20 DIAGNOSIS — Z955 Presence of coronary angioplasty implant and graft: Secondary | ICD-10-CM

## 2018-01-20 DIAGNOSIS — M170 Bilateral primary osteoarthritis of knee: Secondary | ICD-10-CM | POA: Diagnosis present

## 2018-01-20 DIAGNOSIS — H3530 Unspecified macular degeneration: Secondary | ICD-10-CM | POA: Diagnosis present

## 2018-01-20 DIAGNOSIS — H409 Unspecified glaucoma: Secondary | ICD-10-CM | POA: Diagnosis present

## 2018-01-20 DIAGNOSIS — G8929 Other chronic pain: Secondary | ICD-10-CM | POA: Diagnosis present

## 2018-01-20 DIAGNOSIS — Z86010 Personal history of colonic polyps: Secondary | ICD-10-CM

## 2018-01-20 LAB — CBC WITH DIFFERENTIAL/PLATELET
BASOS PCT: 1 %
Basophils Absolute: 0.1 10*3/uL (ref 0.0–0.1)
Eosinophils Absolute: 0.1 10*3/uL (ref 0.0–0.5)
Eosinophils Relative: 2 %
HCT: 19.9 % — ABNORMAL LOW (ref 36.0–46.0)
Hemoglobin: 5.3 g/dL — CL (ref 12.0–15.0)
Lymphocytes Relative: 23 %
Lymphs Abs: 1.5 10*3/uL (ref 0.7–4.0)
MCH: 19.1 pg — ABNORMAL LOW (ref 26.0–34.0)
MCHC: 26.6 g/dL — ABNORMAL LOW (ref 30.0–36.0)
MCV: 71.6 fL — AB (ref 80.0–100.0)
MONOS PCT: 3 %
Monocytes Absolute: 0.2 10*3/uL (ref 0.1–1.0)
NEUTROS PCT: 71 %
NRBC: 0 /100{WBCs}
Neutro Abs: 4.6 10*3/uL (ref 1.7–7.7)
PLATELETS: 278 10*3/uL (ref 150–400)
RBC: 2.78 MIL/uL — ABNORMAL LOW (ref 3.87–5.11)
RDW: 17.9 % — ABNORMAL HIGH (ref 11.5–15.5)
WBC: 6.5 10*3/uL (ref 4.0–10.5)
nRBC: 0.6 % — ABNORMAL HIGH (ref 0.0–0.2)

## 2018-01-20 LAB — VITAMIN B12: VITAMIN B 12: 239 pg/mL (ref 180–914)

## 2018-01-20 LAB — COMPREHENSIVE METABOLIC PANEL
ALT: 13 U/L (ref 0–44)
AST: 16 U/L (ref 15–41)
Albumin: 2.4 g/dL — ABNORMAL LOW (ref 3.5–5.0)
Alkaline Phosphatase: 91 U/L (ref 38–126)
Anion gap: 9 (ref 5–15)
BUN: 24 mg/dL — ABNORMAL HIGH (ref 8–23)
CHLORIDE: 103 mmol/L (ref 98–111)
CO2: 26 mmol/L (ref 22–32)
Calcium: 8 mg/dL — ABNORMAL LOW (ref 8.9–10.3)
Creatinine, Ser: 1.61 mg/dL — ABNORMAL HIGH (ref 0.44–1.00)
GFR, EST AFRICAN AMERICAN: 33 mL/min — AB (ref >60)
GFR, EST NON AFRICAN AMERICAN: 29 mL/min — AB (ref >60)
Glucose, Bld: 105 mg/dL — ABNORMAL HIGH (ref 70–99)
Potassium: 3.5 mmol/L (ref 3.5–5.1)
Sodium: 138 mmol/L (ref 135–145)
Total Bilirubin: 1 mg/dL (ref 0.3–1.2)
Total Protein: 5.1 g/dL — ABNORMAL LOW (ref 6.5–8.1)

## 2018-01-20 LAB — RETICULOCYTES
IMMATURE RETIC FRACT: 28.5 % — AB (ref 2.3–15.9)
RBC.: 2.78 MIL/uL — ABNORMAL LOW (ref 3.87–5.11)
RETIC CT PCT: 2.6 % (ref 0.4–3.1)
Retic Count, Absolute: 72 10*3/uL (ref 19.0–186.0)

## 2018-01-20 LAB — CREATININE, URINE, RANDOM: Creatinine, Urine: 170.64 mg/dL

## 2018-01-20 LAB — IRON AND TIBC
IRON: 12 ug/dL — AB (ref 28–170)
Saturation Ratios: 3 % — ABNORMAL LOW (ref 10.4–31.8)
TIBC: 476 ug/dL — AB (ref 250–450)
UIBC: 464 ug/dL

## 2018-01-20 LAB — TROPONIN I
TROPONIN I: 0.03 ng/mL — AB (ref <0.03)
TROPONIN I: 0.04 ng/mL — AB (ref <0.03)
Troponin I: 0.04 ng/mL (ref <0.03)

## 2018-01-20 LAB — FOLATE: Folate: 10.3 ng/mL (ref >5.9)

## 2018-01-20 LAB — PREPARE RBC (CROSSMATCH)

## 2018-01-20 LAB — DIGOXIN LEVEL: Digoxin Level: 1.9 ng/mL (ref 0.8–2.0)

## 2018-01-20 LAB — FERRITIN: Ferritin: 9 ng/mL — ABNORMAL LOW (ref 11–307)

## 2018-01-20 LAB — POC OCCULT BLOOD, ED: FECAL OCCULT BLD: NEGATIVE

## 2018-01-20 SURGERY — CANCELLED PROCEDURE

## 2018-01-20 MED ORDER — TORSEMIDE 20 MG PO TABS
20.0000 mg | ORAL_TABLET | Freq: Two times a day (BID) | ORAL | Status: DC
  Administered 2018-01-20 – 2018-01-21 (×2): 20 mg via ORAL
  Filled 2018-01-20 (×2): qty 1

## 2018-01-20 MED ORDER — SODIUM CHLORIDE 0.9% IV SOLUTION
Freq: Once | INTRAVENOUS | Status: AC
  Administered 2018-01-20: 21:00:00 via INTRAVENOUS

## 2018-01-20 MED ORDER — POLYVINYL ALCOHOL 1.4 % OP SOLN
1.0000 [drp] | Freq: Four times a day (QID) | OPHTHALMIC | Status: DC | PRN
  Filled 2018-01-20: qty 15

## 2018-01-20 MED ORDER — FUROSEMIDE 10 MG/ML IJ SOLN: 40.0000 mg | Freq: Once | INTRAMUSCULAR | Status: DC

## 2018-01-20 MED ORDER — LATANOPROST 0.005 % OP SOLN
1.0000 [drp] | Freq: Every day | OPHTHALMIC | Status: DC
  Administered 2018-01-20 – 2018-01-22 (×3): 1 [drp] via OPHTHALMIC
  Filled 2018-01-20: qty 2.5

## 2018-01-20 MED ORDER — LORATADINE 10 MG PO TABS
10.0000 mg | ORAL_TABLET | Freq: Every day | ORAL | Status: DC
  Administered 2018-01-20 – 2018-01-22 (×2): 10 mg via ORAL
  Filled 2018-01-20 (×4): qty 1

## 2018-01-20 MED ORDER — AMIODARONE HCL 200 MG PO TABS
200.0000 mg | ORAL_TABLET | Freq: Every day | ORAL | Status: DC
  Administered 2018-01-20 – 2018-01-23 (×4): 200 mg via ORAL
  Filled 2018-01-20 (×4): qty 1

## 2018-01-20 MED ORDER — ONDANSETRON HCL 4 MG/2ML IJ SOLN: 4.0000 mg | Freq: Four times a day (QID) | INTRAMUSCULAR | Status: DC | PRN

## 2018-01-20 MED ORDER — DIGOXIN 125 MCG PO TABS
0.1250 mg | ORAL_TABLET | Freq: Every day | ORAL | Status: DC
  Administered 2018-01-20 – 2018-01-23 (×4): 0.125 mg via ORAL
  Filled 2018-01-20 (×4): qty 1

## 2018-01-20 MED ORDER — POTASSIUM CHLORIDE CRYS ER 10 MEQ PO TBCR
10.0000 meq | EXTENDED_RELEASE_TABLET | Freq: Every day | ORAL | Status: DC
  Administered 2018-01-21 – 2018-01-23 (×3): 10 meq via ORAL
  Filled 2018-01-20 (×3): qty 1

## 2018-01-20 MED ORDER — SODIUM CHLORIDE 0.9 % IV SOLN: INTRAVENOUS | Status: DC

## 2018-01-20 MED ORDER — DORZOLAMIDE HCL 2 % OP SOLN
1.0000 [drp] | Freq: Two times a day (BID) | OPHTHALMIC | Status: DC
  Administered 2018-01-20 – 2018-01-23 (×6): 1 [drp] via OPHTHALMIC
  Filled 2018-01-20: qty 10

## 2018-01-20 MED ORDER — ONDANSETRON HCL 4 MG PO TABS: 4.0000 mg | ORAL_TABLET | Freq: Four times a day (QID) | ORAL | Status: DC | PRN

## 2018-01-20 MED ORDER — METHOCARBAMOL 500 MG PO TABS: 500.0000 mg | ORAL_TABLET | Freq: Four times a day (QID) | ORAL | Status: DC | PRN

## 2018-01-20 MED ORDER — PANTOPRAZOLE SODIUM 40 MG PO TBEC
40.0000 mg | DELAYED_RELEASE_TABLET | Freq: Every day | ORAL | Status: DC
  Filled 2018-01-20 (×3): qty 1

## 2018-01-20 MED ORDER — NITROGLYCERIN 0.4 MG SL SUBL: 0.4000 mg | SUBLINGUAL_TABLET | SUBLINGUAL | Status: DC | PRN

## 2018-01-20 MED ORDER — ROSUVASTATIN CALCIUM 10 MG PO TABS
5.0000 mg | ORAL_TABLET | Freq: Every day | ORAL | Status: DC
  Administered 2018-01-20 – 2018-01-22 (×3): 5 mg via ORAL
  Filled 2018-01-20 (×4): qty 1

## 2018-01-20 MED ORDER — ACETAMINOPHEN 325 MG PO TABS: 650.0000 mg | ORAL_TABLET | Freq: Four times a day (QID) | ORAL | Status: DC | PRN

## 2018-01-20 MED ORDER — TRAZODONE HCL 50 MG PO TABS: 25.0000 mg | ORAL_TABLET | Freq: Every evening | ORAL | Status: DC | PRN

## 2018-01-20 MED ORDER — ACETAMINOPHEN 650 MG RE SUPP: 650.0000 mg | Freq: Four times a day (QID) | RECTAL | Status: DC | PRN

## 2018-01-20 NOTE — H&P (Signed)
History and Physical    [name] TGG:[id number] DOB: 1933/05/21 DOA: 01/20/2018  PCP: Jani Gravel, MD Patient coming from: [facility]  Chief Complaint: Cardioversion  HPI: [name] is a 84 y.o. female with medical history significant for CAD status post stent in 2012, paroxysmal atrial fibrillation on amiodarone and Xarelto, anemia, gastritis and diastolic CHF (EF 60 to 70%) in 10/2017 who presented to ED for planned cardioversion for paroxysmal atrial fibrillation and found to be anemic to 5.3.  Patient reports dizziness for the last 3 weeks.  She describes it as "I am going to faint".  She denies vertigo.  She states she was told to be back in A. fib by a cardiologist (Dr. Einar Gip) about 3 weeks ago.  She also reports significant lack of energy over the last 3 weeks.  She denies chest pain or dyspnea.  She denies hematochezia or melena.  She says she has chronic diarrhea for years.  He had a colonoscopy in 05/2014 that showed two sessile polyps in the ascending colon and moderately severe diverticulosis of sigmoid and ascending colon.  The polyps were snared.  Pathology showed tubular adenoma without malignancy. Of note, patient had similar presentation in August with hemoglobin down to 8.  She had an EGD that showed erosive gastropathy.  She was transfused and discharged home with hemoglobin of 9 g/dL.  In ED, vital signs within normal limits.  In IV without RVR.  EKG with downslanting ST wave.  (Patient is on digoxin) initial troponin 0 0.03.  Hemoglobin down to 5.3.  Anemia panel with severe iron deficiency.  FOBT negative.  Typed and crossed.  2 units of blood ordered by ED provider.  I was called to admit patient for further management.   ROS Review of Systems  Constitutional: Negative for fever and weight loss.  HENT: Negative for congestion and sore throat.   Eyes: Negative for blurred vision and pain.  Respiratory: Negative for cough and shortness of  breath.   Cardiovascular: Positive for palpitations and leg swelling. Negative for chest pain and orthopnea.  Gastrointestinal: Positive for diarrhea. Negative for abdominal pain, blood in stool, melena, nausea and vomiting.       Chronic diarrhea  Genitourinary: Negative for dysuria and hematuria.  Musculoskeletal: Positive for back pain. Negative for myalgias.       Chronic  Skin: Negative for rash.  Neurological: Positive for dizziness and weakness. Negative for speech change, focal weakness and headaches.       Generalized weakness  Endo/Heme/Allergies: Does not bruise/bleed easily.  Psychiatric/Behavioral: Negative for depression and substance abuse. The patient is not nervous/anxious.    PMH Past Medical History:  Diagnosis Date  . Acalculous cholecystitis   . Arthritis   . Blood transfusion   . Blood transfusion without reported diagnosis   . Cataract    bilateral-removed from both eyes  . Coronary artery disease    mi  7/12,  stress test 12/21/2011=>negative for ischemia [name])  . Diabetes mellitus    pt states she is not diabetic  . Diverticulitis   . Dizzy   . Dyslipidemia   . Fatigue   . GERD (gastroesophageal reflux disease)   . Glaucoma   . Hypertension   . Hypokalemia   . Mitral regurgitation    mild to moderate, cardiac echo 12/02/2011, EF 51% [name])  . Myocardial infarction ([place])    7/12  . PONV (postoperative nausea and vomiting)    yrs ago  .  Shortness of breath    PSH Past Surgical History:  Procedure Laterality Date  . ABDOMINAL HYSTERECTOMY    . BILIARY DRAINAGE  9/12  . BIOPSY  11/04/2017   Procedure: BIOPSY;  Surgeon: Jackquline Denmark, MD;  Location: [facility] ENDOSCOPY;  Service: Endoscopy;;  . CARDIOVERSION N/A 02/12/2015   Procedure: CARDIOVERSION;  Surgeon: [name], MD;  Location: [place];  Service: Cardiovascular;  Laterality: N/A;  . CARDIOVERSION N/A 11/10/2017   Procedure: CARDIOVERSION;  Surgeon: Nigel Mormon, MD;  Location:  [facility] ENDOSCOPY;  Service: Cardiovascular;  Laterality: N/A;  . CATARACT EXTRACTION     2  . CHOLECYSTECTOMY  02/25/2011   Procedure: LAPAROSCOPIC CHOLECYSTECTOMY WITH INTRAOPERATIVE CHOLANGIOGRAM;  Surgeon: Gayland Curry, MD;  Location: [place];  Service: General;  Laterality: N/A;  . CORONARY ANGIOPLASTY WITH STENT PLACEMENT  09/24/10   drug eluting stents  . ESOPHAGOGASTRODUODENOSCOPY (EGD) WITH PROPOFOL N/A 11/04/2017   Procedure: ESOPHAGOGASTRODUODENOSCOPY (EGD) WITH PROPOFOL;  Surgeon: Jackquline Denmark, MD;  Location[facility] ENDOSCOPY;  Service: Endoscopy;  Laterality: N/A;  . PARATHYROIDECTOMY     tumor excision  . SHOULDER SURGERY     right  . SIGMOIDOSCOPY     flex sig in the 80's per pt.   Fam HX Family History  Problem Relation Age of Onset  . Heart disease Father   . Heart disease Brother   . Melanoma Son   . Cancer Maternal Grandfather   . Colon cancer Neg Hx   . Rectal cancer Neg Hx   . Stomach cancer Neg Hx    Social Hx  reports that she has never smoked. She has never used smokeless tobacco. She reports that she drinks alcohol. She reports that she does not use drugs. Allergy Allergies  Allergen Reactions  . Dilaudid [Hydromorphone Hcl] Other (See Comments)    Caused patient psychological disturbances   . Aspirin     On xalerto  . Losartan Other (See Comments)    Per MAR  . Bactrim [Sulfamethoxazole-Trimethoprim] Rash  . Sulfur Rash   Home Meds Prior to Admission medications   Medication Sig Start Date End Date Taking? Authorizing Provider  amiodarone (PACERONE) 200 MG tablet Take 1 tablet (200 mg total) by mouth 2 (two) times daily. Patient taking differently: Take 200 mg by mouth daily. (0800) 11/11/17  Yes Danford, Suann Larry, MD  clotrimazole-betamethasone (LOTRISONE) cream Apply 1 application topically 2 (two) times daily as needed (itching). Affected area 08/30/17  Yes [provider]  digoxin (LANOXIN) 0.125 MG tablet Take 0.125 mg by mouth daily.  (0800)   Yes [provider]  dorzolamide (TRUSOPT) 2 % ophthalmic solution Place 1 drop into the left eye 2 (two) times daily. (0800 & 1900)   Yes [provider]  ergocalciferol (VITAMIN D2) 50000 units capsule Take 50,000 Units by mouth every Monday. (0800)   Yes [provider]  hydrocortisone cream 1 % Apply topically 3 (three) times daily as needed for itching. Patient taking differently: Apply 1 application topically 3 (three) times daily as needed for itching.  11/11/17  Yes Danford, Suann Larry, MD  latanoprost (XALATAN) 0.005 % ophthalmic solution Place 1 drop into the left eye at bedtime. (1900) 07/28/17  Yes [provider]  loratadine (CLARITIN) 10 MG tablet Take 10 mg by mouth daily. (0800)   Yes [provider]  methocarbamol (ROBAXIN) 500 MG tablet Take 500 mg by mouth 4 (four) times daily as needed (pain).    Yes [provider]  Multiple  Vitamins-Minerals (PRESERVISION AREDS 2) CAPS Take 1 capsule by mouth 2 (two) times daily.   Yes [provider]  nitroGLYCERIN (NITROSTAT) 0.4 MG SL tablet Place 0.4 mg under the tongue every 5 (five) minutes x 3 doses as needed for chest pain.   Yes [provider]  pantoprazole (PROTONIX) 40 MG tablet Take 1 tablet (40 mg total) by mouth daily at 6 (six) AM. 11/12/17  Yes Danford, Suann Larry, MD  potassium chloride (K-DUR) 10 MEQ tablet Take 1 tablet (10 mEq total) by mouth daily. Patient taking differently: Take 10 mEq by mouth daily at 6 (six) AM.  11/11/17  Yes Danford, Suann Larry, MD  Propylene Glycol (SYSTANE BALANCE OP) Apply 1 drop to eye 4 (four) times daily as needed (dry eyes).    Yes [provider]  rivaroxaban (XARELTO) 20 MG TABS tablet Take 20 mg by mouth daily with supper. (1900)   Yes [provider]  rosuvastatin (CRESTOR) 5 MG tablet Take 5 mg by mouth at bedtime. (2000)   Yes [provider]  torsemide (DEMADEX) 20 MG tablet  Take 1 tablet (20 mg total) by mouth 2 (two) times daily. Patient taking differently: Take 20 mg by mouth daily at 6 (six) AM. (0600) 11/11/17  Yes Danford, Suann Larry, MD  torsemide (DEMADEX) 20 MG tablet Take 20 mg by mouth daily as needed (at 2pm if needed for weight gain >3 lbs).   Yes [provider]  diltiazem (CARDIZEM CD) 180 MG 24 hr capsule Take 1 capsule (180 mg total) by mouth daily. Patient not taking: Reported on 01/16/2018 11/11/17   Edwin Dada, MD    Physical Exam: Vitals:   01/20/18 1145 01/20/18 1200 01/20/18 1215 01/20/18 1218  BP:  (!) 146/52 (!) 146/52   Pulse:  60 60   Resp:  16 16   Temp: 98.7 F (37.1 C)  (!) 97.3 F (36.3 C) (!) 97.3 F (36.3 C)  TempSrc: Temporal  Temporal Temporal  SpO2:  100% 100%   Weight:      Height:        Constitutional: NAD, calm, comfortable Eyes: Conjunctival pallor HENMT: atraumatic. Normocephalic. Hearing grossly intact. No rhinorrhea. MMM. Neck: normal. Supple. No mass. No thyromegaly.  No JVD Resp: normal effort. Good air movement bilaterally.  Crackles over right lower lung field.  CVS: Regular.  56/min. S1 & S2 heard. No murmurs.  1+ pitting edema bilaterally GI: normal bowel sound.  Mild diffuse tenderness.  No rebound or guarding MSK: No obvious deformity. No focal tenderness. Moving extremities.  Skin: no apparent lesion. Normal warmth.  Neuro: Awake. Alert. Oriented appropriately. CN 2-12 grossly intact. Light sensation intact. Normal strength. DTR symmetric. Psych: Calm. Normal judgment and insight.  Labs on Admission: I have personally reviewed following labs and imaging studies  CBC: Recent Labs  Lab 01/20/18 0938  WBC 6.5  NEUTROABS 4.6  HGB 5.3*  HCT 19.9*  MCV 71.6*  PLT 824   Basic Metabolic Panel: Recent Labs  Lab 01/20/18 0938  NA 138  K 3.5  CL 103  CO2 26  GLUCOSE 105*  BUN 24*  CREATININE 1.61*  CALCIUM 8.0*   GFR: Estimated Creatinine Clearance: 27.4 mL/min  (A) (by C-G formula based on SCr of 1.61 mg/dL (H)). Liver Function Tests: Recent Labs  Lab 01/20/18 0938  AST 16  ALT 13  ALKPHOS 91  BILITOT 1.0  PROT 5.1*  ALBUMIN 2.4*   No results for input(s): LIPASE, AMYLASE in  the last 168 hours. No results for input(s): AMMONIA in the last 168 hours. Coagulation Profile: No results for input(s): INR, PROTIME in the last 168 hours. Cardiac Enzymes: Recent Labs  Lab 01/20/18 0938  TROPONINI 0.03*   BNP (last 3 results) No results for input(s): PROBNP in the last 8760 hours. HbA1C: No results for input(s): HGBA1C in the last 72 hours. CBG: No results for input(s): GLUCAP in the last 168 hours. Lipid Profile: No results for input(s): CHOL, HDL, LDLCALC, TRIG, CHOLHDL, LDLDIRECT in the last 72 hours. Thyroid Function Tests: No results for input(s): TSH, T4TOTAL, FREET4, T3FREE, THYROIDAB in the last 72 hours. Anemia Panel: Recent Labs    01/20/18 0938  VITAMINB12 239  FOLATE 10.3  FERRITIN 9*  TIBC 476*  IRON 12*  RETICCTPCT 2.6   Urine analysis:    Component Value Date/Time   COLORURINE YELLOW 01/25/2015 2122   APPEARANCEUR CLEAR 01/25/2015 2122   LABSPEC 1.012 01/25/2015 2122   PHURINE 5.5 01/25/2015 2122   GLUCOSEU NEGATIVE 01/25/2015 2122   HGBUR NEGATIVE 01/25/2015 2122   BILIRUBINUR NEGATIVE 01/25/2015 2122   KETONESUR NEGATIVE 01/25/2015 2122   PROTEINUR NEGATIVE 01/25/2015 2122   UROBILINOGEN 0.2 01/25/2015 2122   NITRITE NEGATIVE 01/25/2015 2122   LEUKOCYTESUR NEGATIVE 01/25/2015 2122    Sepsis Labs: none )No results found for this or any previous visit (from the past 240 hour(s)).   Radiological Exams on Admission: No results found.  All images have been reviewed by me personally.   EKG: Independently reviewed.  A. fib without RVR.  ST depression in inferior and anterior leads.  Patient on digoxin.  Patient without chest pain.  Assessment/Plan Active Problems:   Myocardial infarction [facility])    Atrial fibrillation (HCC)   Symptomatic anemia   Iron deficiency anemia   Gastritis   CAD (coronary artery disease)   Symptomatic iron deficiency anemia: Hemoglobin 5.3 on admission.  Baseline 9.  Anemia panel consistent with severe iron deficiency anemia. Colonoscopy about 3 years ago showed diverticulosis but patient denies hematochezia melena.  FOBT negative.  Typed and crossed in ED.  2 units of blood ordered.  Hemodynamically stable -Post transfusion H&H -Hold Xarelto per cardiology (Dr. Einar Gip) -PPI -GI consult if no appropriate response after transfusion  Paroxysmal atrial fibrillation: Not in RVR.  Chads vas score 5 -Continue home amiodarone and digoxin -Check digoxin level -Hold Xarelto  Diastolic CHF: Echocardiogram in 10/2017 with EF of 60 to 65%, severely dilated left and right atrium, PAPP of 45.  Exam with mild crackles over right lower lung field and 1+ pitting edema bilaterally -Torsemide 20 mg twice daily -Daily weight, intake and output and BMP  CAD status post stent in 2012: No anginal symptoms.  EKG with ST depression in inferior leads and anterior leads.  But patient with chest pain.  She is on digoxin which could be the cause.  Initial troponin 0.03 -Check digoxin level as above -Serial troponin -Repeat EKG -Continue statin -Not on beta-blocker due to fatigue and dyspnea per cardiology notes  Elevated creatinine on CKD 3: Serum creatinine 1.61; 1.08 on discharge on 814/2019.  Unclear if this is AKI or chronic progression. She is on torsemide and PPI at home -Recheck BMP -Continue toresamide -Check FEurea  Chronic back pain: On Robaxin at home -Continue home meds -PT/OT  DVT prophylaxis: SCD Code Status: Full code.  She states "I do want to be a vegetable".  She says her sons, Kathrynn Backstrom and Tehani Mersman can make a  decision if she is not able. Family Communication: Family member at bedside Disposition Plan: Admit to telemetry Consults called:  Talked to Dr. Einar Gip over the phone Admission status: Inpatient status  Mercy Riding MD [facility] Pager [phone]  If 7PM-7AM, please contact night-coverage [email or web address] Password [facility]  01/20/2018, 12:31 PM

## 2018-01-20 NOTE — Progress Notes (Signed)
Pt transferred to [id number] from ED  Via bed with staff. Pt given CHG bath. Pt oriented to room and call bell. Call bell within reach.  Amanda Cockayne, RN

## 2018-01-20 NOTE — ED Provider Notes (Signed)
[facility] EMERGENCY DEPARTMENT Provider Note   CSN: [id number] Arrival date & time: 01/20/18  1540     History   Chief Complaint Chief Complaint  Patient presents with  . Anemia    HPI [name] is a 84 y.o. female.  HPI  The patient is an 84 year old female, she has a known history of cardiac disease, diabetes, has history of requiring anticoagulation including Xarelto for her atrial fibrillation, she presented today for a cardioversion for the atrial fibrillation when she was found to be anemic with a hemoglobin of around 6-1/2.  She had been previously cardioverted back in August 2019 when she underwent cardioversion, successfully.  She presents today stating that she has had 2 weeks of progressive weakness, difficulty with shortness of breath that she does anything, lightheadedness and dizziness when she sits up or stands up.  She denies any blood in her stools but states she has chronic diarrhea which never looks bloody or black.  She is currently taking Xarelto.  She does not have any chest pain at this time.  Symptoms are persistent, gradually worsening, nothing makes this better, worse with standing.  Past Medical History:  Diagnosis Date  . Acalculous cholecystitis   . Arthritis   . Blood transfusion   . Blood transfusion without reported diagnosis   . Cataract    bilateral-removed from both eyes  . Coronary artery disease    mi  7/12,  stress test 12/21/2011=>negative for ischemia [name])  . Diabetes mellitus    pt states she is not diabetic  . Diverticulitis   . Dizzy   . Dyslipidemia   . Fatigue   . GERD (gastroesophageal reflux disease)   . Glaucoma   . Hypertension   . Hypokalemia   . Mitral regurgitation    mild to moderate, cardiac echo 12/02/2011, EF 51% [name])  . Myocardial infarction ([place])    7/12  . PONV (postoperative nausea and vomiting)    yrs ago  . Shortness of breath     Patient Active Problem List   Diagnosis Date Noted  . Atrial fibrillation with RVR ([place]) 11/02/2017  . Symptomatic anemia 11/02/2017  . Atrial fibrillation ([place]) 01/21/2015  . Diarrhea 04/24/2014  . S/P laparoscopic cholecystectomy 02/26/2011  . Hypertension 02/05/2011  . Myocardial infarction ([place]) 02/05/2011  . Hyperlipidemia 02/05/2011  . GERD (gastroesophageal reflux disease) 02/05/2011  . Anxiety 02/05/2011  . Depression 02/05/2011  . Hepatic steatosis 02/05/2011  . Pulmonary hypertension ([place]) 02/05/2011    Past Surgical History:  Procedure Laterality Date  . ABDOMINAL HYSTERECTOMY    . BILIARY DRAINAGE  9/12  . BIOPSY  11/04/2017   Procedure: BIOPSY;  Surgeon: Jackquline Denmark, MD;  Location: [facility] ENDOSCOPY;  Service: Endoscopy;;  . CARDIOVERSION N/A 02/12/2015   Procedure: CARDIOVERSION;  Surgeon: [name], MD;  Location: [facility];  Service: Cardiovascular;  Laterality: N/A;  . CARDIOVERSION N/A 11/10/2017   Procedure: CARDIOVERSION;  Surgeon: Nigel Mormon, MD;  Location: [facility] ENDOSCOPY;  Service: Cardiovascular;  Laterality: N/A;  . CATARACT EXTRACTION     2  . CHOLECYSTECTOMY  02/25/2011   Procedure: LAPAROSCOPIC CHOLECYSTECTOMY WITH INTRAOPERATIVE CHOLANGIOGRAM;  Surgeon: Gayland Curry, MD;  Location: [facility];  Service: General;  Laterality: N/A;  . CORONARY ANGIOPLASTY WITH STENT PLACEMENT  09/24/10   drug eluting stents  . ESOPHAGOGASTRODUODENOSCOPY (EGD) WITH PROPOFOL N/A 11/04/2017   Procedure: ESOPHAGOGASTRODUODENOSCOPY (EGD) WITH PROPOFOL;  Surgeon: Jackquline Denmark, MD;  Location: [facility];  Service:  Endoscopy;  Laterality: N/A;  . PARATHYROIDECTOMY     tumor excision  . SHOULDER SURGERY     right  . SIGMOIDOSCOPY     flex sig in the 80's per pt.     OB History   None      Home Medications    Prior to Admission medications   Medication Sig Start Date End Date Taking? Authorizing Provider  amiodarone (PACERONE) 200 MG tablet Take 1 tablet (200 mg total) by mouth 2 (two) times  daily. Patient taking differently: Take 200 mg by mouth daily. (0800) 11/11/17  Yes Danford, Suann Larry, MD  clotrimazole-betamethasone (LOTRISONE) cream Apply 1 application topically 2 (two) times daily as needed (itching). Affected area 08/30/17  Yes [provider]  digoxin (LANOXIN) 0.125 MG tablet Take 0.125 mg by mouth daily. (0800)   Yes [provider]  dorzolamide (TRUSOPT) 2 % ophthalmic solution Place 1 drop into the left eye 2 (two) times daily. (0800 & 1900)   Yes [provider]  ergocalciferol (VITAMIN D2) 50000 units capsule Take 50,000 Units by mouth every Monday. (0800)   Yes [provider]  hydrocortisone cream 1 % Apply topically 3 (three) times daily as needed for itching. Patient taking differently: Apply 1 application topically 3 (three) times daily as needed for itching.  11/11/17  Yes Danford, Suann Larry, MD  latanoprost (XALATAN) 0.005 % ophthalmic solution Place 1 drop into the left eye at bedtime. (1900) 07/28/17  Yes [provider]  loratadine (CLARITIN) 10 MG tablet Take 10 mg by mouth daily. (0800)   Yes [provider]  methocarbamol (ROBAXIN) 500 MG tablet Take 500 mg by mouth 4 (four) times daily as needed (pain).    Yes [provider]  Multiple Vitamins-Minerals (PRESERVISION AREDS 2) CAPS Take 1 capsule by mouth 2 (two) times daily.   Yes [provider]  nitroGLYCERIN (NITROSTAT) 0.4 MG SL tablet Place 0.4 mg under the tongue every 5 (five) minutes x 3 doses as needed for chest pain.   Yes [provider]  pantoprazole (PROTONIX) 40 MG tablet Take 1 tablet (40 mg total) by mouth daily at 6 (six) AM. 11/12/17  Yes Danford, Suann Larry, MD  potassium chloride (K-DUR) 10 MEQ tablet Take 1 tablet (10 mEq total) by mouth daily. Patient taking differently: Take 10 mEq by mouth daily at 6 (six) AM.  11/11/17  Yes Danford, Suann Larry, MD  Propylene Glycol (SYSTANE BALANCE OP) Apply 1  drop to eye 4 (four) times daily as needed (dry eyes).    Yes [provider]  rivaroxaban (XARELTO) 20 MG TABS tablet Take 20 mg by mouth daily with supper. (1900)   Yes [provider]  rosuvastatin (CRESTOR) 5 MG tablet Take 5 mg by mouth at bedtime. (2000)   Yes [provider]  torsemide (DEMADEX) 20 MG tablet Take 1 tablet (20 mg total) by mouth 2 (two) times daily. Patient taking differently: Take 20 mg by mouth daily at 6 (six) AM. (0600) 11/11/17  Yes Danford, Suann Larry, MD  torsemide (DEMADEX) 20 MG tablet Take 20 mg by mouth daily as needed (at 2pm if needed for weight gain >3 lbs).   Yes [provider]  diltiazem (CARDIZEM CD) 180 MG 24 hr capsule Take 1 capsule (180 mg total) by mouth daily. Patient not taking: Reported on 01/16/2018 11/11/17   Edwin Dada, MD    Family History Family History  Problem Relation Age of Onset  .  Heart disease Father   . Heart disease Brother   . Melanoma Son   . Cancer Maternal Grandfather   . Colon cancer Neg Hx   . Rectal cancer Neg Hx   . Stomach cancer Neg Hx     Social History Social History   Tobacco Use  . Smoking status: Never Smoker  . Smokeless tobacco: Never Used  Substance Use Topics  . Alcohol use: Yes    Alcohol/week: 0.0 standard drinks    Comment: socially-few drinks on the weekends  . Drug use: No     Allergies   Dilaudid [hydromorphone hcl]; Aspirin; Losartan; Bactrim [sulfamethoxazole-trimethoprim]; and Sulfur   Review of Systems Review of Systems  All other systems reviewed and are negative.    Physical Exam Updated Vital Signs BP (!) 153/53   Pulse (!) 59   Temp (!) 97.4 F (36.3 C) (Oral)   Resp 20   Ht 1.588 m (5' 2.5")   Wt 81.6 kg   SpO2 100%   BMI 32.40 kg/m   Physical Exam  Constitutional: She appears well-developed and well-nourished. No distress.  HENT:  Head: Normocephalic and atraumatic.  Mouth/Throat: Oropharynx is clear and  moist. No oropharyngeal exudate.  Eyes: Pupils are equal, round, and reactive to light. EOM are normal. Right eye exhibits no discharge. Left eye exhibits no discharge. No scleral icterus.  Conjunctive are pale  Neck: Normal range of motion. Neck supple. No JVD present. No thyromegaly present.  Cardiovascular: Normal heart sounds and intact distal pulses. Exam reveals no gallop and no friction rub.  No murmur heard. Atrial fibrillation, mild bradycardia, normal pulses, no significant edema  Pulmonary/Chest: Effort normal and breath sounds normal. No respiratory distress. She has no wheezes. She has no rales.  Abdominal: Soft. Bowel sounds are normal. She exhibits no distension and no mass. There is no tenderness.  Musculoskeletal: Normal range of motion. She exhibits no edema or tenderness.  Lymphadenopathy:    She has no cervical adenopathy.  Neurological: She is alert. Coordination normal.  Skin: Skin is warm and dry. No rash noted. No erythema.  Psychiatric: She has a normal mood and affect. Her behavior is normal.  Nursing note and vitals reviewed.    ED Treatments / Results  Labs (all labs ordered are listed, but only abnormal results are displayed) Labs Reviewed  CBC WITH DIFFERENTIAL/PLATELET - Abnormal; Notable for the following components:      Result Value   RBC 2.78 (*)    Hemoglobin 5.3 (*)    HCT 19.9 (*)    MCV 71.6 (*)    MCH 19.1 (*)    MCHC 26.6 (*)    RDW 17.9 (*)    nRBC 0.6 (*)    All other components within normal limits  COMPREHENSIVE METABOLIC PANEL - Abnormal; Notable for the following components:   Glucose, Bld 105 (*)    BUN 24 (*)    Creatinine, Ser 1.61 (*)    Calcium 8.0 (*)    Total Protein 5.1 (*)    Albumin 2.4 (*)    GFR calc non Af Amer 29 (*)    GFR calc Af Amer 33 (*)    All other components within normal limits  TROPONIN I - Abnormal; Notable for the following components:   Troponin I 0.03 (*)    All other components within normal  limits  RETICULOCYTES - Abnormal; Notable for the following components:   RBC. 2.78 (*)    Immature Retic Fract 28.5 (*)  All other components within normal limits  CBC WITH DIFFERENTIAL/PLATELET  VITAMIN B12  FOLATE  IRON AND TIBC  FERRITIN  DIGOXIN LEVEL  POC OCCULT BLOOD, ED  TYPE AND SCREEN  PREPARE RBC (CROSSMATCH)    EKG EKG Interpretation  Date/Time:  Friday January 20 2018 09:01:31 EDT Ventricular Rate:  56 PR Interval:    QRS Duration: 152 QT Interval:  462 QTC Calculation: 446 R Axis:   48 Text Interpretation:  Atrial fibrillation IVCD, consider atypical RBBB ST depression, consider ischemia, diffuse lds dig effect likely Since last tracing rate slower Confirmed by Noemi Chapel [phone]) on 01/20/2018 10:20:08 AM   Radiology No results found.  Procedures .Critical Care Performed by: Noemi Chapel, MD Authorized by: Noemi Chapel, MD   Critical care provider statement:    Critical care time (minutes):  35   Critical care time was exclusive of:  Separately billable procedures and treating other patients and teaching time   Critical care was necessary to treat or prevent imminent or life-threatening deterioration of the following conditions: anemia.   Critical care was time spent personally by me on the following activities:  Blood draw for specimens, development of treatment plan with patient or surrogate, discussions with consultants, evaluation of patient's response to treatment, examination of patient, obtaining history from patient or surrogate, ordering and performing treatments and interventions, ordering and review of laboratory studies, ordering and review of radiographic studies, pulse oximetry, re-evaluation of patient's condition and review of old charts   (including critical care time)  Medications Ordered in ED Medications  0.9 %  sodium chloride infusion (has no administration in time range)  0.9 %  sodium chloride infusion (Manually program via  Guardrails IV Fluids) (has no administration in time range)     Initial Impression / Assessment and Plan / ED Course  I have reviewed the triage vital signs and the nursing notes.  Pertinent labs & imaging results that were available during my care of the patient were reviewed by me and considered in my medical decision making (see chart for details).    The patient is not in distress, she is in atrial fibrillation, rate is controlled, blood pressure is fine, she does appear significantly pale, this correlates with a reported history of anemia, she will need a rectal exam, lab work to confirm the anemia and likely admission if she is truly anemic.  Labs reviewed, the patient is severely anemic with a hemoglobin of 5.3, the MCV is low suggesting a component of iron deficiency, the fecal occult blood testing was also negative surprisingly.  The patient will need to be acutely transfused due to severe anemia.  She has become more and more symptomatic over time though she is not acutely symptomatically this time.  She is critically ill with severe anemia, she will need to be admitted to the hospital, transfused started in the ED.  D/w Hospitalist who will admit  Final Clinical Impressions(s) / ED Diagnoses   Final diagnoses:  Severe anemia      Noemi Chapel, MD 01/20/18 1135

## 2018-01-20 NOTE — ED Notes (Signed)
Heart Healthy Diet was ordered for Lunch. 

## 2018-01-20 NOTE — ED Triage Notes (Signed)
Transfer from endoscopy- was scheduled for cardioversion today and c/o dizziness- pt is pale- Istat hgb was 6.5--  IV in place in right [facility].  Alert/oriented x 4, son at bedside

## 2018-01-20 NOTE — H&P (Signed)
OFFICE VISIT NOTES COPIED TO EPIC FOR DOCUMENTATION  . History of Present Illness [name] Page MD; 01/07/2018 10:29 PM) Patient words: 2 week f/u with EKG for afib; Last OV 12/22/2017.  The patient is a 84 year old female who presents for a Follow-up for Atrial fibrillation.  Additional reasons for visit:  Follow-up for Coronary artery disease is described as the following: [name] is a Caucasian female with known coronary artery disease and LAD stenting in 2012, paroxysmal atrial fibrillation with history of cardioversion in 2016, amiodarone discontinued due to dyspnea and prolonged QT. She has hyperlipidemia and hypertensin, severe degenerative disc disease, chronic back pain, arthritis and sedentary lifestyle due to this. She has recently moved into assisted living (2019).   Patient was recently admitted to the hospital on 11/02/2017 for symptomatic anemia from erosive gastritis, needed blood transfusion, A. Fib with RVR and failed cardioversion, Amiodarone restarted. I had seen her 2 weeks ago with worsening dyspnea and leg edema, start her back on furosemide, she has lost 6 pounds in weight, breathing is now improved. Metoprolol was discontinued due to low blood pressure, presently on amiodarone and digoxin. She has noticed marked fatigue and lack of energy. She has not had any GI bleed and tolerating anticoagulants months ago.  She does have diffuse peripheral arterial disease, symptoms of claudication or remained stable, her activity is significantly limitedand essentially in a wheelchair and tries to walk with a walker.   Problem List/Past Medical [name]; 07-Jan-2018 1:21 PM) Ocular hypertension (H40.059)  Age-related macular degeneration (H35.30) [05/12/2016]: Hyperlipidemia, group A (E78.00)  Coronary artery disease involving native coronary artery of native heart without angina pectoris (I25.10)  Heart catheterization 09/24/10: PCI to proximal  and mid LAD with 4x18 and 3.5x9 Resolute DES, D1 3.0x12 Resolute stent, Kissing balloon angioplasty. Normal circumflex and RCA. Normal LVEF. Hyperglycemia (R73.9)  Essential hypertension, benign (I10)  History of chronic back pain (Z87.39)  Permanent atrial fibrillation (I48.21)  CHA2DS2-VASc Score is 5 with yearly risk of stroke of 6.7%. Direct current cardioversion 02/12/2015: A. Fib to NSR with 120Jx1 Laboratory examination (Z01.89)  12/12/2017: Creatinine 1.4, GFR 35/40, potassium 4.0, calcium 8.2, BMP otherwise normal. Labs 11/15/2017: HB 9.3/HCT 30.9, platelets 325. Microcytic indicis. Serum glucose 93 mg, BUN 43, creatinine 1.8, eGFR 29 mL, potassium 3.1. Labs 10/05/2017: HB 8.7/HCT 28.3, platelets 303. Microcytic indicis. Serum glucose 18 mg, BUN 17, creatinine 1.0, eGFR 55 mL, potassium 4.0. LFTs normal. A1c 5.9%. Total cholesterol 105, triglycerides 53, HDL 63, LDL 31. Non-HDL cholesterol 42. TSH normal. Vitamin D 42. Labs 03/27/2017: CBC normal, BUN 11, creatinine 0.9, eGFR greater than 60 mL, CBC normal. A1c 6.0%. Total cholesterol 152, triglycerides 35, HDL 1 and 5, LDL 40. Non-HDL cholesterol 47. Acute on chronic diastolic CHF (congestive heart failure) (I50.33)  S/P PTCA (percutaneous transluminal coronary angioplasty) (Z98.61) [09/04/2010]:  Allergies [name]; 01-07-18 1:21 PM) SulfADIAZINE *SULFONAMIDES*  Rash. Dilaudid *ANALGESICS - OPIOID*  Hallucinations  Family History [name]; Jan 07, 2018 1:21 PM) Father  died at age 46 from a MI; had heart disease. Mother  died at age 2 from old age; had dementia. Siblings  2 (1 living). Living brother has heart disease;h/o heart surgery?  Social History [name]; 07-Jan-2018 1:21 PM) Current tobacco use  Never smoker. Marital status  Widowed. Living Situation  at [facility] assisted [facility] in [place] Number of [place]  2. Non Drinker/No Alcohol Use   Past Surgical History [name];  Jan 07, 2018 1:21 PM) Underwent Cholecystectomy 02/25/11  Hysterectomy 1990  Right shoulder surgery 2009  2 cataract surgeries 2008  Parathyroid removed 1985.  Laser Surgery [11/07/2014]: Left. For Glaucoma  Medication History [name] Page, MD; 01/05/2018 2:55 PM) Pantoprazole Sodium (40MG Tablet DR, 1 Oral daily) Active. Hydrocort (1% Lotion, as needed External) Active. Nitrostat (0.4MG Tab Sublingual, 1 (one) Tab Sub Tab Sublingua Sublingual every 5 minutes as needed for chest pain., Taken starting 12/22/2017) Active. Amiodarone HCl (200MG Tablet, 1 Tablet Tablet Oral daily, Taken starting 12/09/2017) Active. Digoxin (125MCG Tablet, 1 (one) Tablet Oral daily, Taken starting 12/23/2017) Active. Dorzolamide HCl (2% Solution, 1 drop Ophthalmic two times daily) Active. Latanoprost (0.005% Solution, 1 drop in affected eye Ophthalmic daily) Active. Potassium Chloride ER (10MEQ Capsule ER, 1 Capsule Oral twice a day as directed with Torsemide, Taken starting 12/22/2017) Active. Robaxin (500MG Tablet, 2 Oral as needed) Active. Vitamin D (Ergocalciferol) (50000UNIT Capsule, 1 Oral weekly) Active. Torsemide (20MG Tablet, 1 Tablet Oral twice daily, Am and 2 pm as directed, Taken starting 12/22/2017) Active. (Once daily if weight stable, BID if weight > 3 Lbs or leg edema) Xarelto (20MG Tablet, 1 Tablet Tablet Tablet Tab Oral every evening after dinner, Taken starting 12/29/2016) Active. Crestor (5MG Tablet, 1 tablet Oral once a day) Active. Imodium A-D (2MG Tablet, 1 Oral as needed) Active. Loratadine (10MG Capsule, 1 Oral daily) Active. ([facility] said pt has been refusing to take tihis medication since 12/27/2017) PreserVision AREDS 2 (1 Oral two times daily) Active. Systane Ultra preserved ophthalmic solution (1 drop in affected eye daily prn) Active. Medications Reconciled (list present & verbally with patient)  Diagnostic Studies History [name];  01/05/2018 1:21 PM) Echocardiogram [01/22/2015]: Normal LV size, mild LVH, diastolic function cannot evaluated due to atrial fibrillation. Mild mitral and tricuspid regurgitation. Mild pulmonary hypertension. Moderator band noted in the right ventricle. Bone Density Study [03/03/2016]: Colonoscopy [05/2014]: 2 Benign polyps were removed    Review of Systems [name] Page MD; 01/05/2018 2:54 PM) General Present- Fatigue. Not Present- Fever and Night Sweats. Respiratory Not Present- Cough, Decreased Exercise Tolerance, Difficulty Breathing and Difficulty Breathing on Exertion. Cardiovascular Present- Edema. Not Present- Chest Pain, Claudications, Orthopnea, Palpitations and Paroxysmal Nocturnal Dyspnea. Gastrointestinal Present- Diarrhea (chronic) and Heartburn. Not Present- Abdominal Pain, Black, Tarry Stool, Bloody Stool, Nausea and Vomiting. Musculoskeletal Present- Backache and Joint Pain (arthritis of the ankle, knees and hip and back). Neurological Present- Dizziness. Not Present- Headaches. Hematology Not Present- Blood Clots, Easy Bruising and Nose Bleed. All other systems negative  Vitals [name]; 01/05/2018 1:45 PM) 01/05/2018 1:27 PM Weight: 186.56 lb Height: 62in Body Surface Area: 1.86 m Body Mass Index: 34.12 kg/m  Pulse: 71 (Regular)  P.OX: 99% (Room air) BP: 137/60 (Sitting, Left Arm, Standard)       Physical Exam [name] Page, MD; 01/05/2018 2:56 PM) General Mental Status-Alert. General Appearance-Cooperative, Appears stated age, Not in acute distress. Orientation-Oriented X3. Build & Nutrition-Moderately built and Moderately obese.  Head and Neck Thyroid Gland Characteristics - no palpable nodules, no palpable enlargement.  Chest and Lung Exam Chest and lung exam reveals -quiet, even and easy respiratory effort with no use of accessory muscles and on auscultation, normal breath sounds, no adventitious  sounds.  Cardiovascular Cardiovascular examination reveals -carotid auscultation reveals no bruits, abdominal aorta auscultation reveals no bruits and no prominent pulsation, femoral artery auscultation bilaterally reveals normal pulses, no bruits, no thrills and normal pedal pulses bilaterally. Auscultation Rhythm - Irregular. Heart Sounds - S1 is variable, S2 WNL and No  gallop present. Murmurs & Other Heart Sounds - Normal exam - No Murmurs.  Abdomen Inspection Contour - Obese and Pannus present. Palpation/Percussion Normal exam - Non Tender and No hepatosplenomegaly. Auscultation Normal exam - Bowel sounds normal.  Peripheral Vascular Lower Extremity Palpation - Edema - Bilateral - 2+ Pitting edema.  Neurologic Motor-Grossly intact without any focal deficits.  Musculoskeletal Global Assessment Left Lower Extremity - normal range of motion without pain. Right Lower Extremity - normal range of motion without pain.    Assessment & Plan [name] Page MD; 01/05/2018 10:30 PM) Persistent atrial fibrillation (I48.19) Story: CHA2DS2-VASc Score is 5 with yearly risk of stroke of 6.7%.  Direct current cardioversion 02/12/2015: A. Fib to NSR with 120Jx1 Impression: EKG 01/05/2018: Atrial fibrillation controlled went response at the rate of 68 bpm, normal axis, right bundle branch block. Nonspecific ST-T abnormality. Cannot exclude inferior and lateral ischemia. Low-voltage complexes. Normal QT interval. No significant change from EKG 12/22/2017: Atrial fibrillation with rapid ventricular response at rate of 110 bpm  EKG 10/08/2016: Normal sinus rhythm at rate of 63 bpm, normal axis. Cannot exclude inferior infarct old. Early R transition cannot exclude posterior infarct old. No evidence of ischemia. No significant change from EKG 04/09/2016. Current Plans Complete electrocardiogram (93000) Coronary artery disease involving native coronary artery of native heart without angina  pectoris (I25.10) Story: Heart catheterization 09/24/10: PCI to proximal and mid LAD with 4x18 and 3.5x9 Resolute DES, D1 3.0x12 Resolute stent, Kissing balloon angioplasty. Normal circumflex and RCA. Normal LVEF. Impression: EKG 11/19/2013: Normal sinus rhythm at the rate of 70 bpm, normal intervals. No evidence of ischemia, normal EKG. No change from EKG 11/16/2012 Essential hypertension, benign (I78) Chronic diastolic HF (heart failure) (I50.32) Laboratory examination (M76.72) Story: 12/12/2017: Creatinine 1.4, GFR 35/40, potassium 4.0, calcium 8.2, BMP otherwise normal.  Labs 11/15/2017: HB 9.3/HCT 30.9, platelets 325. Microcytic indicis. Serum glucose 93 mg, BUN 43, creatinine 1.8, eGFR 29 mL, potassium 3.1.  Labs 10/05/2017: HB 8.7/HCT 28.3, platelets 303. Microcytic indicis. Serum glucose 18 mg, BUN 17, creatinine 1.0, eGFR 55 mL, potassium 4.0. LFTs normal. A1c 5.9%. Total cholesterol 105, triglycerides 53, HDL 63, LDL 31. Non-HDL cholesterol 42. TSH normal. Vitamin D 42.  Labs 03/27/2017: CBC normal, BUN 11, creatinine 0.9, eGFR greater than 60 mL, CBC normal. A1c 6.0%. Total cholesterol 152, triglycerides 35, HDL 1 and 5, LDL 40. Non-HDL cholesterol 47.  Note:-  Recommendations:  [name] is a Caucasian female with known coronary artery disease and LAD stenting in 2012, paroxysmal atrial fibrillation with history of cardioversion in 2016, amiodarone discontinued due to dyspnea and prolonged QT. She has hyperlipidemia and hypertensin, severe degenerative disc disease, chronic back pain, arthritis and sedentary lifestyle due to this. She has recently moved into assisted living (2019).   Patient was recently admitted to the hospital on 11/02/2017 for symptomatic anemia from erosive gastritis, needed blood transfusion, A. Fib with RVR and failed cardioversion, Amiodarone restarted.  Patient is here on a two-week office visit and follow-up of atrial fibrillation with RVR,  metoprolol has been discontinued due to low blood pressure. On digoxin and amiodarone, heart rate is now much improved, she is also lost about 6 pounds in weight with aggressive salt restriction and also furosemide. Lungs are clear today. She is also atrial fibrillation, rate controlled. Due to marked dyspnea, fatigue, I'd like to attempt repeat cardioversion. Schedule for Direct current cardioversion. I have discussed regarding risks benefits rate control vs rhythm control with the patient. Patient understands  cardiac arrest and need for CPR, aspiration pneumonia, but not limited to these. Patient is willing. It will be scheduled for in the next week or so.  CC Dr. Jani Gravel.    Signed by [name] Page, MD (01/05/2018 10:31 PM)

## 2018-01-21 ENCOUNTER — Encounter (HOSPITAL_COMMUNITY): Payer: Self-pay | Admitting: Internal Medicine

## 2018-01-21 DIAGNOSIS — D509 Iron deficiency anemia, unspecified: Secondary | ICD-10-CM | POA: Diagnosis not present

## 2018-01-21 DIAGNOSIS — I4811 Longstanding persistent atrial fibrillation: Secondary | ICD-10-CM

## 2018-01-21 DIAGNOSIS — I4891 Unspecified atrial fibrillation: Secondary | ICD-10-CM | POA: Diagnosis not present

## 2018-01-21 DIAGNOSIS — D649 Anemia, unspecified: Secondary | ICD-10-CM | POA: Diagnosis not present

## 2018-01-21 DIAGNOSIS — I2510 Atherosclerotic heart disease of native coronary artery without angina pectoris: Secondary | ICD-10-CM | POA: Diagnosis not present

## 2018-01-21 LAB — UREA NITROGEN, URINE: UREA NITROGEN UR: 739 mg/dL

## 2018-01-21 LAB — BASIC METABOLIC PANEL
ANION GAP: 8 (ref 5–15)
BUN: 25 mg/dL — ABNORMAL HIGH (ref 8–23)
CHLORIDE: 105 mmol/L (ref 98–111)
CO2: 26 mmol/L (ref 22–32)
Calcium: 7.9 mg/dL — ABNORMAL LOW (ref 8.9–10.3)
Creatinine, Ser: 1.68 mg/dL — ABNORMAL HIGH (ref 0.44–1.00)
GFR calc non Af Amer: 27 mL/min — ABNORMAL LOW (ref >60)
GFR, EST AFRICAN AMERICAN: 32 mL/min — AB (ref >60)
GLUCOSE: 101 mg/dL — AB (ref 70–99)
Potassium: 3.7 mmol/L (ref 3.5–5.1)
SODIUM: 139 mmol/L (ref 135–145)

## 2018-01-21 LAB — CBC
HEMATOCRIT: 28.4 % — AB (ref 36.0–46.0)
HEMOGLOBIN: 8.4 g/dL — AB (ref 12.0–15.0)
MCH: 22.4 pg — AB (ref 26.0–34.0)
MCHC: 29.6 g/dL — ABNORMAL LOW (ref 30.0–36.0)
MCV: 75.7 fL — AB (ref 80.0–100.0)
NRBC: 1 % — AB (ref 0.0–0.2)
Platelets: 275 10*3/uL (ref 150–400)
RBC: 3.75 MIL/uL — AB (ref 3.87–5.11)
RDW: 20.2 % — ABNORMAL HIGH (ref 11.5–15.5)
WBC: 6.8 10*3/uL (ref 4.0–10.5)

## 2018-01-21 LAB — TROPONIN I: Troponin I: 0.04 ng/mL (ref <0.03)

## 2018-01-21 MED ORDER — SODIUM CHLORIDE 0.9 % IV SOLN
510.0000 mg | Freq: Once | INTRAVENOUS | Status: AC
  Administered 2018-01-21: 510 mg via INTRAVENOUS
  Filled 2018-01-21: qty 17

## 2018-01-21 MED ORDER — CYANOCOBALAMIN 1000 MCG/ML IJ SOLN
1000.0000 ug | Freq: Once | INTRAMUSCULAR | Status: AC
  Administered 2018-01-21: 1000 ug via INTRAMUSCULAR
  Filled 2018-01-21: qty 1

## 2018-01-21 MED ORDER — TORSEMIDE 20 MG PO TABS
20.0000 mg | ORAL_TABLET | Freq: Two times a day (BID) | ORAL | Status: DC
  Administered 2018-01-23: 20 mg via ORAL
  Filled 2018-01-21: qty 1

## 2018-01-21 MED ORDER — RIVAROXABAN 15 MG PO TABS
15.0000 mg | ORAL_TABLET | Freq: Every day | ORAL | Status: DC
  Administered 2018-01-21 – 2018-01-22 (×2): 15 mg via ORAL
  Filled 2018-01-21 (×2): qty 1

## 2018-01-21 NOTE — Progress Notes (Signed)
ANTICOAGULATION CONSULT NOTE - Initial Consult  Pharmacy Consult for Xarelto Indication: atrial fibrillation  Allergies  Allergen Reactions  . Dilaudid [Hydromorphone Hcl] Other (See Comments)    Caused patient psychological disturbances   . Aspirin     On xalerto  . Losartan Other (See Comments)    Per MAR  . Bactrim [Sulfamethoxazole-Trimethoprim] Rash  . Sulfur Rash    Patient Measurements: Height: 5' 2.5" (158.8 cm) Weight: 183 lb 3.2 oz (83.1 kg) IBW/kg (Calculated) : 51.25  Vital Signs: Temp: 98 F (36.7 C) (10/26 0809) Temp Source: Oral (10/26 0809) BP: 136/53 (10/26 0809) Pulse Rate: 65 (10/26 0814)  Labs: Recent Labs    01/20/18 0938 01/20/18 1311 01/20/18 2041 01/21/18 0440  HGB 5.3*  --   --  8.4*  HCT 19.9*  --   --  28.4*  PLT 278  --   --  275  CREATININE 1.61*  --   --  1.68*  TROPONINI 0.03* 0.04* 0.04* 0.04*    Estimated Creatinine Clearance: 26.5 mL/min (A) (by C-G formula based on SCr of 1.68 mg/dL (H)).   Medical History: Past Medical History:  Diagnosis Date  . Acalculous cholecystitis   . Arthritis   . Blood transfusion   . Blood transfusion without reported diagnosis   . Cataract    bilateral-removed from both eyes  . Coronary artery disease    mi  7/12,  stress test 12/21/2011=>negative for ischemia [name])  . Diabetes mellitus    pt states she is not diabetic  . Diverticulitis   . Dizzy   . Dyslipidemia   . Fatigue   . GERD (gastroesophageal reflux disease)   . Glaucoma   . Hypertension   . Hypokalemia   . Mitral regurgitation    mild to moderate, cardiac echo 12/02/2011, EF 51% [name])  . Myocardial infarction ([place])    7/12  . PONV (postoperative nausea and vomiting)    yrs ago  . Shortness of breath     Medications:  Medications Prior to Admission  Medication Sig Dispense Refill Last Dose  . amiodarone (PACERONE) 200 MG tablet Take 1 tablet (200 mg total) by mouth 2 (two) times daily. (Patient taking  differently: Take 200 mg by mouth daily. (0800)) 60 tablet 2 01/19/2018 at Unknown time  . clotrimazole-betamethasone (LOTRISONE) cream Apply 1 application topically 2 (two) times daily as needed (itching). Affected area  0 01/19/2018 at Unknown time  . digoxin (LANOXIN) 0.125 MG tablet Take 0.125 mg by mouth daily. (0800)   01/19/2018 at Unknown time  . dorzolamide (TRUSOPT) 2 % ophthalmic solution Place 1 drop into the left eye 2 (two) times daily. (0800 & 1900)   01/19/2018 at Unknown time  . ergocalciferol (VITAMIN D2) 50000 units capsule Take 50,000 Units by mouth every Monday. (0800)   01/19/2018 at Unknown time  . hydrocortisone cream 1 % Apply topically 3 (three) times daily as needed for itching. (Patient taking differently: Apply 1 application topically 3 (three) times daily as needed for itching. ) 30 g 0 01/19/2018 at Unknown time  . latanoprost (XALATAN) 0.005 % ophthalmic solution Place 1 drop into the left eye at bedtime. (1900)  11 01/19/2018 at Unknown time  . loratadine (CLARITIN) 10 MG tablet Take 10 mg by mouth daily. (0800)   01/19/2018 at Unknown time  . methocarbamol (ROBAXIN) 500 MG tablet Take 500 mg by mouth 4 (four) times daily as needed (pain).    01/19/2018 at Unknown time  .  Multiple Vitamins-Minerals (PRESERVISION AREDS 2) CAPS Take 1 capsule by mouth 2 (two) times daily.   01/19/2018 at Unknown time  . nitroGLYCERIN (NITROSTAT) 0.4 MG SL tablet Place 0.4 mg under the tongue every 5 (five) minutes x 3 doses as needed for chest pain.     . pantoprazole (PROTONIX) 40 MG tablet Take 1 tablet (40 mg total) by mouth daily at 6 (six) AM. 30 tablet 2 01/19/2018 at Unknown time  . potassium chloride (K-DUR) 10 MEQ tablet Take 1 tablet (10 mEq total) by mouth daily. (Patient taking differently: Take 10 mEq by mouth daily at 6 (six) AM. ) 30 tablet 2 01/19/2018 at Unknown time  . Propylene Glycol (SYSTANE BALANCE OP) Apply 1 drop to eye 4 (four) times daily as needed (dry eyes).     01/19/2018 at Unknown time  . rivaroxaban (XARELTO) 20 MG TABS tablet Take 20 mg by mouth daily with supper. (1900)   01/19/2018 at Unknown time  . rosuvastatin (CRESTOR) 5 MG tablet Take 5 mg by mouth at bedtime. (2000)   01/19/2018 at Unknown time  . torsemide (DEMADEX) 20 MG tablet Take 1 tablet (20 mg total) by mouth 2 (two) times daily. (Patient taking differently: Take 20 mg by mouth daily at 6 (six) AM. (0600)) 60 tablet 2 01/19/2018 at Unknown time  . torsemide (DEMADEX) 20 MG tablet Take 20 mg by mouth daily as needed (at 2pm if needed for weight gain >3 lbs).   01/19/2018 at Unknown time  . diltiazem (CARDIZEM CD) 180 MG 24 hr capsule Take 1 capsule (180 mg total) by mouth daily. (Patient not taking: Reported on 01/16/2018) 30 capsule 2 Not Taking at Unknown time    Assessment: 53 YOF with symptomatic iron deficiency anemia presented with a hemoglobin of 5.3 on admission and received 2 units of PRBCs yesterday. She is on Xarelto prior to admission which was held on admission. Per GI service, this is not a GI hemorrhage and rivaroxaban can be restarted. H/H improved today. Plt wnl.   SCr is elevated at 1.68 (BL ~ 1.08). CrCl ~ 35 mL/min.   Goal of Therapy:  Stroke prevention Monitor platelets by anticoagulation protocol: Yes   Plan:  -Will resume lower dose rivaroxaban 15 mg daily with supper given AKI. Will monitor for improvement in renal function  -Capsule endoscopy planned tomorrow -Monitor for s/s of bleeding   Albertina Parr, PharmD., BCPS Clinical Pharmacist Clinical phone for 01/21/18 until 3:30pm: [phone] If after 3:30pm, please refer to [facility] for unit-specific pharmacist

## 2018-01-21 NOTE — Evaluation (Signed)
Physical Therapy Evaluation Patient Details Name: [name] MRN: [id number] DOB: Jul 20, 1933 Today's Date: 01/21/2018   History of Present Illness  Pt adm with anemia with Hgb of 5.3. PMH - afib, cad, dm, htn, chf, ckd, anemia  Clinical Impression  Pt doing well with mobility and no further PT needed.  Ready for dc from PT standpoint.      Follow Up Recommendations Other (comment)(In house exercise at ALF)    Equipment Recommendations  None recommended by PT    Recommendations for Other Services       Precautions / Restrictions Precautions Precautions: Fall Restrictions Weight Bearing Restrictions: No      Mobility  Bed Mobility Overal bed mobility: Modified Independent             General bed mobility comments: Pt up on EOB  Transfers Overall transfer level: Modified independent Equipment used: 4-wheeled walker Transfers: Sit to/from Stand Sit to Stand: Modified independent (Device/Increase time)         General transfer comment: Pt demonstrated locking and unlocking of rollator brakes  Ambulation/Gait Ambulation/Gait assistance: Supervision Gait Distance (Feet): 125 Feet(x 2) Assistive device: 4-wheeled walker Gait Pattern/deviations: Step-through pattern;Decreased stride length Gait velocity: decr Gait velocity interpretation: 1.31 - 2.62 ft/sec, indicative of limited community ambulator General Gait Details: Pt with steady gait with rollator. One short sitting rest break.  Stairs            Wheelchair Mobility    Modified Rankin (Stroke Patients Only)       Balance Overall balance assessment: Needs assistance Sitting-balance support: No upper extremity supported;Feet supported Sitting balance-Leahy Scale: Fair     Standing balance support: No upper extremity supported Standing balance-Leahy Scale: Fair Standing balance comment: Able to statically stand without UE support but benefits from BUE support for dynamic tasks.                               Pertinent Vitals/Pain Pain Assessment: No/denies pain    Home Living Family/patient expects to be discharged to:: Assisted living([facility]) Living Arrangements: (Assisted living facility)             Home Equipment: Walker - 4 wheels;Hand held shower head;Grab bars - toilet;Grab bars - tub/shower;Shower seat      Prior Function Level of Independence: Needs assistance   Gait / Transfers Assistance Needed: Using rollator for mobility  ADL's / Homemaking Assistance Needed: Assistance for meals and medications from staff  Comments: Has been having difficulty due to dizziness.      Hand Dominance        Extremity/Trunk Assessment   Upper Extremity Assessment Upper Extremity Assessment: Defer to OT evaluation    Lower Extremity Assessment Lower Extremity Assessment: Overall WFL for tasks assessed       Communication   Communication: No difficulties  Cognition Arousal/Alertness: Awake/alert Behavior During Therapy: WFL for tasks assessed/performed Overall Cognitive Status: Within Functional Limits for tasks assessed                                        General Comments General comments (skin integrity, edema, etc.): Pt's son present for majority of session.     Exercises     Assessment/Plan    PT Assessment Patent does not need any further PT services  PT Problem List  PT Treatment Interventions      PT Goals (Current goals can be found in the Care Plan section)  Acute Rehab PT Goals Patient Stated Goal: to get back home PT Goal Formulation: All assessment and education complete, DC therapy    Frequency     Barriers to discharge        Co-evaluation               AM-PAC PT "6 Clicks" Daily Activity  Outcome Measure Difficulty turning over in bed (including adjusting bedclothes, sheets and blankets)?: None Difficulty moving from lying on back to sitting on the side of the  bed? : None Difficulty sitting down on and standing up from a chair with arms (e.g., wheelchair, bedside commode, etc,.)?: None Help needed moving to and from a bed to chair (including a wheelchair)?: None Help needed walking in hospital room?: None Help needed climbing 3-5 steps with a railing? : A Little 6 Click Score: 23    End of Session Equipment Utilized During Treatment: Gait belt Activity Tolerance: Patient tolerated treatment well Patient left: in chair;with call bell/phone within reach;with family/visitor present Nurse Communication: Mobility status PT Visit Diagnosis: Muscle weakness (generalized) (M62.81)    Time: 3888-2800 PT Time Calculation (min) (ACUTE ONLY): 14 min   Charges:   PT Evaluation $PT Eval Low Complexity: [place] Pager [phone] Office [place] 01/21/2018, 2:53 PM

## 2018-01-21 NOTE — Progress Notes (Signed)
[facility] PROGRESS NOTE  [name] QMV:[id number] DOB: June 27, 1933 DOA: 01/20/2018 PCP: Jani Gravel, MD  Assessment/Plan: Symptomatic iron deficiency anemia in setting of Xarelto: Hemoglobin 5.3 on admission and 8.4 s/p 2 untis PRBC's yesterday. Baseline 9.  Anemia panel consistent with severe iron deficiency anemia. Colonoscopy about 3 years ago showed diverticulosis. Patient denies hematochezia or melena.  FOBT negative.  remains hemodynamically stable. Evaluated by gi who opine hx small bowel AVM's is consideration and recommending small bowel evaluation given need for blood thinners -Post transfusion H&H -serial cbc -Hold Xarelto per cardiology (Dr. Einar Gip) -PPI -await official recommendations from GI  Paroxysmal atrial fibrillation: rate controlled. Chads vas score 5. Dig level 1.9 -Continue home amiodarone and digoxin -Hold Xarelto  Diastolic CHF: Echocardiogram in 10/2017 with EF of 60 to 65%, severely dilated left and right atrium, PAPP of 45.   -Torsemide 20 mg twice daily -Daily weight, intake and output   CAD status post stent in 2012: No anginal symptoms.  EKG with ST depression in inferior leads and anterior leads. No chest pain this am. Troponin slilghtly elevated and flat -Continue statin -Not on beta-blocker due to fatigue and dyspnea per cardiology notes  Elevated creatinine on CKD 3: Serum creatinine 1.61 on admission and trending up slightly; 1.08 on discharge on 814/2019.  Unclear if this is AKI or chronic progression. She is on torsemide and PPI at home. She reports she has never been told she has CKD. -Recheck BMP -Continue toresamide -hole nephrotoxins -monitor intake and outptu  Chronic back pain: On Robaxin at home -Continue home meds -PT/OT Code Status: full Family Communication: none present Disposition Plan: [facility]   Consultants:  [facility]  Procedures:  none  Antibiotics:    HPI/Subjective: Feels much better.  Denies pain/discomfort  Objective: Vitals:   01/21/18 0809 01/21/18 0814  BP: (!) 136/53   Pulse: 65 65  Resp: 20   Temp: 98 F (36.7 C)   SpO2: 98%     Intake/Output Summary (Last 24 hours) at 01/21/2018 1509 Last data filed at 01/21/2018 0900 Gross per 24 hour  Intake 1265 ml  Output 1000 ml  Net 265 ml   Filed Weights   01/20/18 0709 01/20/18 1740 01/21/18 0424  Weight: 81.6 kg 81.6 kg 83.1 kg    Exam:   General:  Well nourished in no acute distress  Cardiovascular: irreguarly irregular. No MGR trace LE edema  Respiratory: normal effort BS distant bilaterally no wheeze  Abdomen: obese soft +BS no guarding or rebounding  Musculoskeletal: joints without swelling/erythema   Data Reviewed: Basic Metabolic Panel: Recent Labs  Lab 01/20/18 0938 01/21/18 0440  NA 138 139  K 3.5 3.7  CL 103 105  CO2 26 26  GLUCOSE 105* 101*  BUN 24* 25*  CREATININE 1.61* 1.68*  CALCIUM 8.0* 7.9*   Liver Function Tests: Recent Labs  Lab 01/20/18 0938  AST 16  ALT 13  ALKPHOS 91  BILITOT 1.0  PROT 5.1*  ALBUMIN 2.4*   No results for input(s): LIPASE, AMYLASE in the last 168 hours. No results for input(s): AMMONIA in the last 168 hours. CBC: Recent Labs  Lab 01/20/18 0938 01/21/18 0440  WBC 6.5 6.8  NEUTROABS 4.6  --   HGB 5.3* 8.4*  HCT 19.9* 28.4*  MCV 71.6* 75.7*  PLT 278 275   Cardiac Enzymes: Recent Labs  Lab 01/20/18 0938 01/20/18 1311 01/20/18 2041 01/21/18 0440  TROPONINI 0.03* 0.04* 0.04* 0.04*   BNP (last 3 results)  Recent Labs    11/06/17 0243  BNP 238.4*    ProBNP (last 3 results) No results for input(s): PROBNP in the last 8760 hours.  CBG: No results for input(s): GLUCAP in the last 168 hours.  No results found for this or any previous visit (from the past 240 hour(s)).   Studies: No results found.  Scheduled Meds: . amiodarone  200 mg Oral Daily  . digoxin  0.125 mg Oral Daily  . dorzolamide  1 drop Left Eye BID  .  latanoprost  1 drop Left Eye QHS  . loratadine  10 mg Oral Daily  . pantoprazole  40 mg Oral Q0600  . potassium chloride  10 mEq Oral Q0600  . rosuvastatin  5 mg Oral QHS  . [START ON 01/23/2018] torsemide  20 mg Oral BID   Continuous Infusions:  Principal Problem:   Symptomatic anemia Active Problems:   Myocardial infarction [facility])   Atrial fibrillation (HCC)   Iron deficiency anemia   CAD (coronary artery disease)   Gastritis   Gastropathy    Time spent: 35 minutes    [facility]  If 7PM-7AM, please contact night-coverage at [email or web address], password [facility] 01/21/2018, 3:09 PM  LOS: 0 days

## 2018-01-21 NOTE — Evaluation (Signed)
Occupational Therapy Evaluation Patient Details Name: [name] MRN: [id number] DOB: Dec 19, 1933 Today's Date: 01/21/2018    History of Present Illness Pt adm with anemia with Hgb of 5.3. PMH - afib, cad, dm, htn, chf, ckd, anemia   Clinical Impression   PTA, pt was living in assisted living facility with assistance for meals and medications only. She is able to complete all ADL tasks with supervision with RW at this time. She has this level of care available at ALF. She reports feeling much better today and no dizziness during activities. She would benefit from generalized exercise program to increase activity tolerance for ADL participation. Pt is interested in following up with therapies in-house at [facility] in their new facility and feel that this would be beneficial. No further acute OT needs identified at this time and OT will sign off.     Follow Up Recommendations  Other (comment)(follow-up with in-house therapies with [facility])    Equipment Recommendations  None recommended by OT    Recommendations for Other Services       Precautions / Restrictions Precautions Precautions: Fall Restrictions Weight Bearing Restrictions: No      Mobility Bed Mobility Overal bed mobility: Modified Independent             General bed mobility comments: Increased time  Transfers Overall transfer level: Needs assistance Equipment used: Rolling walker (2 wheeled) Transfers: Sit to/from Stand Sit to Stand: Min guard         General transfer comment: Guarding assist for safety.     Balance Overall balance assessment: Needs assistance Sitting-balance support: No upper extremity supported;Feet supported Sitting balance-Leahy Scale: Fair     Standing balance support: Bilateral upper extremity supported;No upper extremity supported Standing balance-Leahy Scale: Fair Standing balance comment: Able to statically stand without UE support but benefits from BUE  support for dynamic tasks.                            ADL either performed or assessed with clinical judgement   ADL Overall ADL's : Needs assistance/impaired Eating/Feeding: Set up;Sitting   Grooming: Supervision/safety;Standing Grooming Details (indicate cue type and reason): She usually sits to brush her teeth at home.  Upper Body Bathing: Supervision/ safety;Sitting   Lower Body Bathing: Sit to/from stand;Supervison/ safety Lower Body Bathing Details (indicate cue type and reason): using AE Upper Body Dressing : Supervision/safety;Sitting   Lower Body Dressing: Sit to/from stand;Supervision/safety Lower Body Dressing Details (indicate cue type and reason): uses slide on shoes or AE Toilet Transfer: Ambulation;RW;Supervision/safety   Toileting- Clothing Manipulation and Hygiene: Sit to/from stand;Min guard       Functional mobility during ADLs: Rolling walker;Min guard General ADL Comments: Pt with decreased activity tolerance for ADL participation.      Vision Patient Visual Report: No change from baseline Vision Assessment?: No apparent visual deficits     Perception     Praxis      Pertinent Vitals/Pain       Hand Dominance     Extremity/Trunk Assessment Upper Extremity Assessment Upper Extremity Assessment: Overall WFL for tasks assessed   Lower Extremity Assessment Lower Extremity Assessment: Generalized weakness       Communication Communication Communication: No difficulties   Cognition Arousal/Alertness: Awake/alert Behavior During Therapy: WFL for tasks assessed/performed Overall Cognitive Status: Within Functional Limits for tasks assessed  General Comments  Pt's son present for majority of session.     Exercises     Shoulder Instructions      Home Living Family/patient expects to be discharged to:: Assisted living([facility]) Living Arrangements: (Assisted living  facility)                           Home Equipment: Walker - 4 wheels;Hand held shower head;Grab bars - toilet;Grab bars - tub/shower;Shower seat          Prior Functioning/Environment Level of Independence: Needs assistance  Gait / Transfers Assistance Needed: Using rollator for mobility ADL's / Homemaking Assistance Needed: Assistance for meals and medications from staff   Comments: Has been having difficulty due to dizziness.         OT Problem List: Decreased strength;Decreased range of motion;Decreased activity tolerance;Impaired balance (sitting and/or standing);Decreased safety awareness;Decreased knowledge of use of DME or AE;Decreased knowledge of precautions      OT Treatment/Interventions:      OT Goals(Current goals can be found in the care plan section) Acute Rehab OT Goals Patient Stated Goal: to get back home OT Goal Formulation: With patient/family Potential to Achieve Goals: Good  OT Frequency:     Barriers to D/C:            Co-evaluation              AM-PAC PT "6 Clicks" Daily Activity     Outcome Measure Help from another person eating meals?: None Help from another person taking care of personal grooming?: None Help from another person toileting, which includes using toliet, bedpan, or urinal?: A Little Help from another person bathing (including washing, rinsing, drying)?: A Little Help from another person to put on and taking off regular upper body clothing?: None Help from another person to put on and taking off regular lower body clothing?: A Little 6 Click Score: 21   End of Session Equipment Utilized During Treatment: Rolling walker  Activity Tolerance: Patient tolerated treatment well Patient left: in bed;with call bell/phone within reach;with family/visitor present(seated at EOB with PT present to initiate her session)  OT Visit Diagnosis: Other abnormalities of gait and mobility (R26.89);Muscle weakness (generalized)  (M62.81)                Time: 3818-2993 OT Time Calculation (min): 27 min Charges:  OT General Charges $OT Visit: 1 Visit OT Evaluation $OT Eval Moderate Complexity: 1 Mod OT Treatments $Self Care/Home Management : 8-22 mins  Belva Agee, OTR/L [place] [facility] [place] [facility] [place] 01/21/2018, 2:06 PM

## 2018-01-21 NOTE — Consult Note (Addendum)
Referring Provider: [facility]  Primary Care Physician:  Jani Gravel, MD Primary Gastroenterologist:  Zenovia Jarred,  MD  Reason for Consultation:  anemia    ASSESSMENT AND PLAN:    1. Iron deficiency anemia, recurrent on Xarelto  Admitted in Aug with new anemia.Now with another decline in hgb from 9.1 mid August to 5.3.   -recurrent anemia of unclear etiology. No dark stools or any overt GI bleeding. No hematuria, epistaxis, or other source of bleeding.Stools heme negative.  -Would wonder about about a spontaneous RP bleed on Xarelto but seems unlikely, especially since this is 2nd acute drop in hgb since August. -Small bowel AVM's a consideration, especially since iron deficient. Suspect [name] will need small bowel evaluation, especially given need for chronic blood thinners.  -s/p 2 units of blood .Hgb up to 8.4 today.\ -last dose of Xarelto was day before yesterday -upon hospital discharge patient would like to follow up at our [facility] office if appt necessary. [name] is unable to go to [facility] to see Dr. Lyndel Safe.     2. AFIB, on Xarelto. Actually admitted to under cardioversion but this was postponed after preprocedure labs yielded hemoglobin of 5      [facility] Attending   I have taken an interval history, reviewed the chart and examined the patient. I agree with the Advanced Practitioner's note, impression and recommendations.   Additions:  1) I bet that the fact that [name] was iron-deficient in august and was not Rxed iron supplementation is why [name] is more anemic and still iron-deficient 2) That said - it is possible [name] has AVM's and with need for Xarelto will do Small bowel capsule endoscopy tomorrow 3) Will Rx feraheme x 1 and will need after dc x 1 4) B12 1000 ug IM x 1 for low NL B12 also 5) Celiac serologies 6) Restart Xarelto - this is not a GI hemorrhage and if [name] has AVM's might catch them bleeding at capsule endo tomorrow 7) further plans pending above  Gatha Mayer, MD, [facility] 01/21/2018 3:40 PM Pager [phone]  HPI: [name] is a 84 y.o. female with atrial fibrillation on Xarelto /CAD remote stenting.  We first saw patient in January 2016 in our office for evaluation of diarrhea. Patient had never had a colonoscopy so we arranged for one  March 2016 colonoscopy .  The bowel prep was good, exam was complete 2 sessile polyps were removed from the ascending colon.  Random biopsies were done to rule out microscopic colitis.  Moderately severe diverticulosis was found in the sigmoid and descending colon.  [name] was started on Colestid for chronic diarrhea.  Polyp pathology compatible tubular adenoma no high-grade dysplasia.  Random biopsies were negative   Our next encounter with the patient was in August of this year when we saw her in the hospital for evaluation of  anemia on Xarelto.  Hemoglobin had fallen from baseline of 12 down to 8.4.  [name] was transfused packed red blood cells.  Colonoscopy was not repeated since [name] has had one in the preceding 2 years.  We did arrange for upper endoscopy  EGD 11/04/2017- nonbleeding erosive gastropathy.  EGD 11/04/2017.  Biopsies compatible with reactive gastropathy.  No H. Pylori  Hemoglobin was 9.1 on 11/09/2017.  Patient had been having problems with dyspnea and fatigue for about 3 weeks. Symptoms felt to be secondary to AFIB.   Cardiology admitted her to the hospital yesterday with plans for cardioversion.  Preprocedure labs however showed that her hemoglobin was at 5.3.  [name] has been transfused 2 units of PRBCs, hemoglobin up to 8.4 today.   Patient was apparently prescribed Protonix during last admission. [name] had chronic loose stools but medications made it a lot worse. [name] isn't on a PPI. Used to take Nexium for GERD but it was expensive so stopped it. No GERD sx in years.   Patient has no abdominal pain, N/V or other GI concerns.     Past Medical History:  Diagnosis Date    . Acalculous cholecystitis   . Arthritis   . Blood transfusion   . Blood transfusion without reported diagnosis   . Cataract    bilateral-removed from both eyes  . Coronary artery disease    mi  7/12,  stress test 12/21/2011=>negative for ischemia [name])  . Diabetes mellitus    pt states [name] is not diabetic  . Diverticulitis   . Dyslipidemia   . GERD (gastroesophageal reflux disease)   . Glaucoma   . Hypertension   . Hypokalemia   . Mitral regurgitation    mild to moderate, cardiac echo 12/02/2011, EF 51% [name])  . Myocardial infarction ([place])    7/12  . PONV (postoperative nausea and vomiting)    yrs ago    Past Surgical History:  Procedure Laterality Date  . ABDOMINAL HYSTERECTOMY    . BILIARY DRAINAGE  9/12  . BIOPSY  11/04/2017   Procedure: BIOPSY;  Surgeon: Jackquline Denmark, MD;  Location: [facility] ENDOSCOPY;  Service: Endoscopy;;  . CARDIOVERSION N/A 02/12/2015   Procedure: CARDIOVERSION;  Surgeon: [name], MD;  Location: [facility];  Service: Cardiovascular;  Laterality: N/A;  . CARDIOVERSION N/A 11/10/2017   Procedure: CARDIOVERSION;  Surgeon: Nigel Mormon, MD;  Location: [facility] ENDOSCOPY;  Service: Cardiovascular;  Laterality: N/A;  . CATARACT EXTRACTION     2  . CHOLECYSTECTOMY  02/25/2011   Procedure: LAPAROSCOPIC CHOLECYSTECTOMY WITH INTRAOPERATIVE CHOLANGIOGRAM;  Surgeon: Gayland Curry, MD;  Location: [facility];  Service: General;  Laterality: N/A;  . CORONARY ANGIOPLASTY WITH STENT PLACEMENT  09/24/10   drug eluting stents  . ESOPHAGOGASTRODUODENOSCOPY (EGD) WITH PROPOFOL N/A 11/04/2017   Procedure: ESOPHAGOGASTRODUODENOSCOPY (EGD) WITH PROPOFOL;  Surgeon: Jackquline Denmark, MD;  Location: [facility] ENDOSCOPY;  Service: Endoscopy;  Laterality: N/A;  . PARATHYROIDECTOMY     tumor excision  . SHOULDER SURGERY     right  . SIGMOIDOSCOPY     flex sig in the 80's per pt.    Prior to Admission medications   Medication Sig Start Date End Date Taking? Authorizing Provider   amiodarone (PACERONE) 200 MG tablet Take 1 tablet (200 mg total) by mouth 2 (two) times daily. Patient taking differently: Take 200 mg by mouth daily. (0800) 11/11/17  Yes Danford, Suann Larry, MD  clotrimazole-betamethasone (LOTRISONE) cream Apply 1 application topically 2 (two) times daily as needed (itching). Affected area 08/30/17  Yes [provider]  digoxin (LANOXIN) 0.125 MG tablet Take 0.125 mg by mouth daily. (0800)   Yes [provider]  dorzolamide (TRUSOPT) 2 % ophthalmic solution Place 1 drop into the left eye 2 (two) times daily. (0800 & 1900)   Yes [provider]  ergocalciferol (VITAMIN D2) 50000 units capsule Take 50,000 Units by mouth every Monday. (0800)   Yes [provider]  hydrocortisone cream 1 % Apply topically 3 (three) times daily as needed for itching. Patient taking differently: Apply 1 application topically 3 (three) times daily as needed  for itching.  11/11/17  Yes Danford, Suann Larry, MD  latanoprost (XALATAN) 0.005 % ophthalmic solution Place 1 drop into the left eye at bedtime. (1900) 07/28/17  Yes [provider]  loratadine (CLARITIN) 10 MG tablet Take 10 mg by mouth daily. (0800)   Yes [provider]  methocarbamol (ROBAXIN) 500 MG tablet Take 500 mg by mouth 4 (four) times daily as needed (pain).    Yes [provider]  Multiple Vitamins-Minerals (PRESERVISION AREDS 2) CAPS Take 1 capsule by mouth 2 (two) times daily.   Yes [provider]  nitroGLYCERIN (NITROSTAT) 0.4 MG SL tablet Place 0.4 mg under the tongue every 5 (five) minutes x 3 doses as needed for chest pain.   Yes [provider]  pantoprazole (PROTONIX) 40 MG tablet Take 1 tablet (40 mg total) by mouth daily at 6 (six) AM. 11/12/17  Yes Danford, Suann Larry, MD  potassium chloride (K-DUR) 10 MEQ tablet Take 1 tablet (10 mEq total) by mouth daily. Patient taking differently: Take 10 mEq by mouth daily at 6 (six) AM.   11/11/17  Yes Danford, Suann Larry, MD  Propylene Glycol (SYSTANE BALANCE OP) Apply 1 drop to eye 4 (four) times daily as needed (dry eyes).    Yes [provider]  rivaroxaban (XARELTO) 20 MG TABS tablet Take 20 mg by mouth daily with supper. (1900)   Yes [provider]  rosuvastatin (CRESTOR) 5 MG tablet Take 5 mg by mouth at bedtime. (2000)   Yes [provider]  torsemide (DEMADEX) 20 MG tablet Take 1 tablet (20 mg total) by mouth 2 (two) times daily. Patient taking differently: Take 20 mg by mouth daily at 6 (six) AM. (0600) 11/11/17  Yes Danford, Suann Larry, MD  torsemide (DEMADEX) 20 MG tablet Take 20 mg by mouth daily as needed (at 2pm if needed for weight gain >3 lbs).   Yes [provider]  diltiazem (CARDIZEM CD) 180 MG 24 hr capsule Take 1 capsule (180 mg total) by mouth daily. Patient not taking: Reported on 01/16/2018 11/11/17   Edwin Dada, MD    Current Facility-Administered Medications  Medication Dose Route Frequency Provider Last Rate Last Dose  . acetaminophen (TYLENOL) tablet 650 mg  650 mg Oral Q6H PRN Wendee Beavers T, MD       Or  . acetaminophen (TYLENOL) suppository 650 mg  650 mg Rectal Q6H PRN Wendee Beavers T, MD      . amiodarone (PACERONE) tablet 200 mg  200 mg Oral Daily Wendee Beavers T, MD   200 mg at 01/21/18 0813  . digoxin (LANOXIN) tablet 0.125 mg  0.125 mg Oral Daily Wendee Beavers T, MD   0.125 mg at 01/21/18 0814  . dorzolamide (TRUSOPT) 2 % ophthalmic solution 1 drop  1 drop Left Eye BID Mercy Riding, MD   1 drop at 01/21/18 0814  . latanoprost (XALATAN) 0.005 % ophthalmic solution 1 drop  1 drop Left Eye QHS Mercy Riding, MD   1 drop at 01/20/18 2044  . loratadine (CLARITIN) tablet 10 mg  10 mg Oral Daily Wendee Beavers T, MD   10 mg at 01/20/18 1451  . methocarbamol (ROBAXIN) tablet 500 mg  500 mg Oral QID PRN Gonfa, Taye T, MD      . nitroGLYCERIN (NITROSTAT) SL tablet 0.4 mg  0.4 mg Sublingual Q5 Min x 3 PRN  Gonfa, Taye T, MD      . ondansetron (ZOFRAN) tablet 4 mg  4 mg Oral Q6H PRN Mercy Riding, MD       Or  . ondansetron (ZOFRAN) injection 4 mg  4 mg Intravenous Q6H PRN Gonfa, Taye T, MD      . pantoprazole (PROTONIX) EC tablet 40 mg  40 mg Oral Q0600 Gonfa, Taye T, MD      . polyvinyl alcohol (LIQUIFILM TEARS) 1.4 % ophthalmic solution 1 drop  1 drop Both Eyes QID PRN Gonfa, Taye T, MD      . potassium chloride (K-DUR,KLOR-CON) CR tablet 10 mEq  10 mEq Oral [id number] Wendee Beavers T, MD   10 mEq at 01/21/18 0639  . rosuvastatin (CRESTOR) tablet 5 mg  5 mg Oral QHS Wendee Beavers T, MD   5 mg at 01/20/18 2043  . [START ON 01/23/2018] torsemide (DEMADEX) tablet 20 mg  20 mg Oral BID Vann, Jessica U, DO      . traZODone (DESYREL) tablet 25 mg  25 mg Oral QHS PRN Mercy Riding, MD        Allergies as of 01/05/2018 - Review Complete 11/10/2017  Allergen Reaction Noted  . Dilaudid [hydromorphone hcl] Other (See Comments) 01/21/2011  . Aspirin  10/27/2017  . Losartan Other (See Comments) 11/02/2017  . Bactrim [sulfamethoxazole-trimethoprim] Rash 11/02/2017  . Sulfur Rash 10/14/2010    Family History  Problem Relation Age of Onset  . Heart disease Father   . Heart disease Brother   . Melanoma Son   . Cancer Maternal Grandfather   . Colon cancer Neg Hx   . Rectal cancer Neg Hx   . Stomach cancer Neg Hx     Social History   Socioeconomic History  . Marital status: Widowed    Spouse name: Not on file  . Number of children: Not on file  . Years of education: Not on file  . Highest education level: Not on file  Occupational History  . Occupation: RETIRED OFFICE WORKER  Social Needs  . Financial resource strain: Not on file  . Food insecurity:    Worry: Not on file    Inability: Not on file  . Transportation needs:    Medical: Not on file    Non-medical: Not on file  Tobacco Use  . Smoking status: Never Smoker  . Smokeless tobacco: Never Used  Substance and Sexual Activity  . Alcohol  use: Yes    Alcohol/week: 0.0 standard drinks    Comment: socially-few drinks on the weekends  . Drug use: No  . Sexual activity: Yes    Birth control/protection: Post-menopausal  Lifestyle  . Physical activity:    Days per week: Not on file    Minutes per session: Not on file  . Stress: Not on file  Relationships  . Social connections:    Talks on phone: Not on file    Gets together: Not on file    Attends religious service: Not on file    Active member of club or organization: Not on file    Attends meetings of clubs or organizations: Not on file    Relationship status: Not on file  . Intimate partner violence:    Fear of current or ex partner: Not on file    Emotionally abused: Not on file    Physically abused: Not on file    Forced sexual activity: Not on file  Other Topics Concern  . Not on file  Social History Narrative  . Not on file    Review of Systems: Positive  for dizziness, SOB over last 3 weeks. All other systems reviewed and negative except where noted in HPI.  Physical Exam: Vital signs in last 24 hours: Temp:  [97.6 F (36.4 C)-98.3 F (36.8 C)] 98 F (36.7 C) (10/26 0809) Pulse Rate:  [53-75] 65 (10/26 0814) Resp:  [13-23] 20 (10/26 0809) BP: (122-157)/(39-107) 136/53 (10/26 0809) SpO2:  [97 %-100 %] 98 % (10/26 0809) Weight:  [81.6 kg-83.1 kg] 83.1 kg (10/26 0424) Last BM Date: 01/21/18 General:   Alert, well-developed white female in NAD Psych:  Pleasant, cooperative. Normal mood and affect. Eyes:  Pupils equal, sclera clear, no icterus.   Conjunctiva pink. Ears:  Normal auditory acuity. Nose:  No deformity, discharge,  or lesions. Neck:  Supple; no masses Lungs:  Clear throughout to auscultation.   No wheezes, crackles, or rhonchi.  Heart:  Regular rate , irreg rhythm, no edema Abdomen:  Soft, non-distended, nontender, BS active, no palp mass    Rectal:  Deferred  Msk:  Symmetrical without gross deformities. . Neurologic:  Alert and   oriented x4;  grossly normal neurologically. Skin:  Intact without significant lesions or rashes..   Intake/Output from previous day: 10/25 0701 - 10/26 0700 In: 1220 [P.O.:320; Blood:900] Out: 700 [Urine:700] Intake/Output this shift: Total I/O In: 360 [P.O.:360] Out: 300 [Urine:300]  Lab Results: Recent Labs    01/20/18 0938 01/21/18 0440  WBC 6.5 6.8  HGB 5.3* 8.4*  HCT 19.9* 28.4*  PLT 278 275   BMET Recent Labs    01/20/18 0938 01/21/18 0440  NA 138 139  K 3.5 3.7  CL 103 105  CO2 26 26  GLUCOSE 105* 101*  BUN 24* 25*  CREATININE 1.61* 1.68*  CALCIUM 8.0* 7.9*   LFT Recent Labs    01/20/18 0938  PROT 5.1*  ALBUMIN 2.4*  AST 16  ALT 13  ALKPHOS 91  BILITOT 1.0      Tye Savoy, NP-C @  01/21/2018, 1:08 PM

## 2018-01-22 ENCOUNTER — Encounter (HOSPITAL_COMMUNITY)
Admission: RE | Disposition: A | Discharge status: Skilled Nursing Facility | Payer: Self-pay | Source: Home / Self Care | Attending: Internal Medicine

## 2018-01-22 ENCOUNTER — Encounter: Payer: Self-pay | Admitting: Internal Medicine

## 2018-01-22 DIAGNOSIS — D509 Iron deficiency anemia, unspecified: Secondary | ICD-10-CM | POA: Diagnosis present

## 2018-01-22 DIAGNOSIS — N179 Acute kidney failure, unspecified: Secondary | ICD-10-CM | POA: Diagnosis not present

## 2018-01-22 DIAGNOSIS — N183 Chronic kidney disease, stage 3 (moderate): Secondary | ICD-10-CM | POA: Diagnosis present

## 2018-01-22 DIAGNOSIS — D649 Anemia, unspecified: Secondary | ICD-10-CM | POA: Diagnosis not present

## 2018-01-22 DIAGNOSIS — I4811 Longstanding persistent atrial fibrillation: Secondary | ICD-10-CM | POA: Diagnosis not present

## 2018-01-22 DIAGNOSIS — I272 Pulmonary hypertension, unspecified: Secondary | ICD-10-CM | POA: Diagnosis present

## 2018-01-22 DIAGNOSIS — Y92009 Unspecified place in unspecified non-institutional (private) residence as the place of occurrence of the external cause: Secondary | ICD-10-CM | POA: Diagnosis not present

## 2018-01-22 DIAGNOSIS — I5033 Acute on chronic diastolic (congestive) heart failure: Secondary | ICD-10-CM | POA: Diagnosis present

## 2018-01-22 DIAGNOSIS — K529 Noninfective gastroenteritis and colitis, unspecified: Secondary | ICD-10-CM | POA: Diagnosis present

## 2018-01-22 DIAGNOSIS — I2510 Atherosclerotic heart disease of native coronary artery without angina pectoris: Secondary | ICD-10-CM | POA: Diagnosis present

## 2018-01-22 DIAGNOSIS — E892 Postprocedural hypoparathyroidism: Secondary | ICD-10-CM | POA: Diagnosis present

## 2018-01-22 DIAGNOSIS — E7800 Pure hypercholesterolemia, unspecified: Secondary | ICD-10-CM | POA: Diagnosis present

## 2018-01-22 DIAGNOSIS — I130 Hypertensive heart and chronic kidney disease with heart failure and stage 1 through stage 4 chronic kidney disease, or unspecified chronic kidney disease: Secondary | ICD-10-CM | POA: Diagnosis present

## 2018-01-22 DIAGNOSIS — Z23 Encounter for immunization: Secondary | ICD-10-CM | POA: Diagnosis not present

## 2018-01-22 DIAGNOSIS — E669 Obesity, unspecified: Secondary | ICD-10-CM | POA: Diagnosis present

## 2018-01-22 DIAGNOSIS — E1165 Type 2 diabetes mellitus with hyperglycemia: Secondary | ICD-10-CM | POA: Diagnosis present

## 2018-01-22 DIAGNOSIS — D500 Iron deficiency anemia secondary to blood loss (chronic): Secondary | ICD-10-CM | POA: Diagnosis not present

## 2018-01-22 DIAGNOSIS — K2970 Gastritis, unspecified, without bleeding: Secondary | ICD-10-CM | POA: Diagnosis present

## 2018-01-22 DIAGNOSIS — H3530 Unspecified macular degeneration: Secondary | ICD-10-CM | POA: Diagnosis present

## 2018-01-22 DIAGNOSIS — I480 Paroxysmal atrial fibrillation: Secondary | ICD-10-CM | POA: Diagnosis present

## 2018-01-22 DIAGNOSIS — I5032 Chronic diastolic (congestive) heart failure: Secondary | ICD-10-CM | POA: Diagnosis present

## 2018-01-22 DIAGNOSIS — Z6832 Body mass index (BMI) 32.0-32.9, adult: Secondary | ICD-10-CM | POA: Diagnosis not present

## 2018-01-22 DIAGNOSIS — K319 Disease of stomach and duodenum, unspecified: Secondary | ICD-10-CM | POA: Diagnosis present

## 2018-01-22 DIAGNOSIS — E1122 Type 2 diabetes mellitus with diabetic chronic kidney disease: Secondary | ICD-10-CM | POA: Diagnosis present

## 2018-01-22 DIAGNOSIS — M1610 Unilateral primary osteoarthritis, unspecified hip: Secondary | ICD-10-CM | POA: Diagnosis present

## 2018-01-22 DIAGNOSIS — E1151 Type 2 diabetes mellitus with diabetic peripheral angiopathy without gangrene: Secondary | ICD-10-CM | POA: Diagnosis present

## 2018-01-22 DIAGNOSIS — M4690 Unspecified inflammatory spondylopathy, site unspecified: Secondary | ICD-10-CM | POA: Diagnosis present

## 2018-01-22 DIAGNOSIS — K219 Gastro-esophageal reflux disease without esophagitis: Secondary | ICD-10-CM | POA: Diagnosis present

## 2018-01-22 DIAGNOSIS — I4821 Permanent atrial fibrillation: Secondary | ICD-10-CM | POA: Diagnosis present

## 2018-01-22 HISTORY — DX: Iron deficiency anemia secondary to blood loss (chronic): D50.0

## 2018-01-22 HISTORY — PX: GIVENS CAPSULE STUDY: SHX5432

## 2018-01-22 LAB — CBC
HCT: 26.5 % — ABNORMAL LOW (ref 36.0–46.0)
HCT: 32.7 % — ABNORMAL LOW (ref 36.0–46.0)
HEMOGLOBIN: 7.8 g/dL — AB (ref 12.0–15.0)
Hemoglobin: 9.8 g/dL — ABNORMAL LOW (ref 12.0–15.0)
MCH: 22.3 pg — ABNORMAL LOW (ref 26.0–34.0)
MCH: 23.1 pg — ABNORMAL LOW (ref 26.0–34.0)
MCHC: 29.4 g/dL — AB (ref 30.0–36.0)
MCHC: 30 g/dL (ref 30.0–36.0)
MCV: 75.7 fL — ABNORMAL LOW (ref 80.0–100.0)
MCV: 77.1 fL — ABNORMAL LOW (ref 80.0–100.0)
NRBC: 0.7 % — AB (ref 0.0–0.2)
PLATELETS: 258 10*3/uL (ref 150–400)
Platelets: 251 10*3/uL (ref 150–400)
RBC: 3.5 MIL/uL — ABNORMAL LOW (ref 3.87–5.11)
RBC: 4.24 MIL/uL (ref 3.87–5.11)
RDW: 20.6 % — AB (ref 11.5–15.5)
RDW: 20.6 % — AB (ref 11.5–15.5)
WBC: 5.9 10*3/uL (ref 4.0–10.5)
WBC: 7 10*3/uL (ref 4.0–10.5)
nRBC: 0.5 % — ABNORMAL HIGH (ref 0.0–0.2)

## 2018-01-22 LAB — BASIC METABOLIC PANEL
Anion gap: 7 (ref 5–15)
BUN: 28 mg/dL — AB (ref 8–23)
CALCIUM: 7.9 mg/dL — AB (ref 8.9–10.3)
CO2: 27 mmol/L (ref 22–32)
CREATININE: 1.67 mg/dL — AB (ref 0.44–1.00)
Chloride: 105 mmol/L (ref 98–111)
GFR, EST AFRICAN AMERICAN: 32 mL/min — AB (ref >60)
GFR, EST NON AFRICAN AMERICAN: 28 mL/min — AB (ref >60)
Glucose, Bld: 96 mg/dL (ref 70–99)
Potassium: 3.5 mmol/L (ref 3.5–5.1)
SODIUM: 139 mmol/L (ref 135–145)

## 2018-01-22 LAB — PREPARE RBC (CROSSMATCH)

## 2018-01-22 LAB — OCCULT BLOOD X 1 CARD TO LAB, STOOL: Fecal Occult Bld: NEGATIVE

## 2018-01-22 LAB — SODIUM, URINE, RANDOM: SODIUM UR: 12 mmol/L

## 2018-01-22 SURGERY — IMAGING PROCEDURE, GI TRACT, INTRALUMINAL, VIA CAPSULE

## 2018-01-22 MED ORDER — ENSURE ENLIVE PO LIQD: 237.0000 mL | Freq: Two times a day (BID) | ORAL | Status: DC

## 2018-01-22 MED ORDER — INFLUENZA VAC SPLIT HIGH-DOSE 0.5 ML IM SUSY
0.5000 mL | PREFILLED_SYRINGE | INTRAMUSCULAR | Status: AC
  Administered 2018-01-23: 0.5 mL via INTRAMUSCULAR
  Filled 2018-01-22: qty 0.5

## 2018-01-22 MED ORDER — HYDROCORTISONE 1 % EX CREA
TOPICAL_CREAM | CUTANEOUS | Status: DC | PRN
  Administered 2018-01-22: 03:00:00 via TOPICAL
  Filled 2018-01-22: qty 28

## 2018-01-22 MED ORDER — SODIUM CHLORIDE 0.9 % IV SOLN: 510.0000 mg | INTRAVENOUS | Status: DC

## 2018-01-22 MED ORDER — SODIUM CHLORIDE 0.9% IV SOLUTION: Freq: Once | INTRAVENOUS | Status: DC

## 2018-01-22 NOTE — Progress Notes (Signed)
Patient swallowed capsule without difficulty at about 1028.  Instructions given and patient verbalized understanding.  RN aware.  Patient to remove leads after 2230 tonight.  Endo to pick up study tomorrow.

## 2018-01-22 NOTE — Progress Notes (Addendum)
     [facility] Progress Note   Chief Complaint:   Recurrent anemia    SUBJECTIVE:    No complaints. Capsule study in progress   ASSESSMENT AND PLAN:    IDA, on Xarelto. This is second admit since Aug for anemia. Heme negative. No overt Gi bleeding. Getting another unit of blood this am for hgb of 7.8.  -Recurrent anemia may have been from lack of iron repletion at time of hospital discharge in Aug. Got dose of feraheme yesterday and needs another dose in one week  I will send note to office nurse to coordinate) -Endo capsule study in progress to look for AVMs or other culprit lesions -Xarelto restarted, no evidence for GI bleed  -negative celiac serologies -got B12 injection for low normal level  -Patient known from previous admission to Dr. Lyndel Safe. we can make patient follow up with me in office as she can't travel to [facility] to see Dr. Lyndel Safe. I may need to reassign her to another GI in our office at the time. Will sort out later    [place] Attending   I have taken an interval history, reviewed the chart and examined the patient. I agree with the Advanced Practitioner's note, impression and recommendations.     Gatha Mayer, MD, [facility] 01/22/2018 3:07 PM Pager [phone]  OBJECTIVE:     Vital signs in last 24 hours: Temp:  [97.6 F (36.4 C)-98 F (36.7 C)] 97.6 F (36.4 C) (10/27 1104) Pulse Rate:  [52-62] 60 (10/27 1104) Resp:  [18-21] 18 (10/27 1104) BP: (136-143)/(41-64) 142/64 (10/27 1104) SpO2:  [95 %-100 %] 100 % (10/27 1104) Weight:  [83.5 kg] 83.5 kg (10/27 0406) Last BM Date: 01/21/18 General:   Alert, well-developed female in NAD EENT:  Normal hearing, non icteric sclera, conjunctive pink.  Heart:  Regular rate and rhythm, no lower extremity edema Pulm: Normal respiratory effort Abdomen:  Soft, nondistended, nontender.  Normal bowel sounds, no masses felt.     Neurologic:  Alert and  oriented x4;   grossly normal neurologically. Psych:  Pleasant, cooperative.  Normal mood and affect.   Intake/Output from previous day: 10/26 0701 - 10/27 0700 In: 720 [P.O.:720] Out: 1425 [Urine:1425] Intake/Output this shift: Total I/O In: 360 [P.O.:360] Out: -   Lab Results: Recent Labs    01/20/18 0938 01/21/18 0440 01/22/18 0356  WBC 6.5 6.8 5.9  HGB 5.3* 8.4* 7.8*  HCT 19.9* 28.4* 26.5*  PLT 278 275 258   BMET Recent Labs    01/20/18 0938 01/21/18 0440 01/22/18 0356  NA 138 139 139  K 3.5 3.7 3.5  CL 103 105 105  CO2 26 26 27   GLUCOSE 105* 101* 96  BUN 24* 25* 28*  CREATININE 1.61* 1.68* 1.67*  CALCIUM 8.0* 7.9* 7.9*   LFT Recent Labs    01/20/18 0938  PROT 5.1*  ALBUMIN 2.4*  AST 16  ALT 13  ALKPHOS 86  BILITOT 1.0     Principal Problem:   Symptomatic anemia Active Problems:   Myocardial infarction (HCC)   Gastropathy   Atrial fibrillation (HCC)   Iron deficiency anemia   Gastritis   CAD (coronary artery disease)     LOS: 0 days   Tye Savoy ,NP 01/22/2018, 11:45 AM

## 2018-01-22 NOTE — Progress Notes (Signed)
PROGRESS NOTE    [name]  XBD:[id number] DOB: 08/30/1933 DOA: 01/20/2018 PCP: Jani Gravel, MD   Outpatient Specialists:     Brief Narrative:  [name] is a 84 y.o. female here with anemia that was discovered prior to her scheduled cardioversion.  She had been fatigued and SOB/weak but attributed that to her a fib.  scheduled for  capsule endoscopy today.  Restarted xarelto last evening. Hg drifting down again. Transfuse 1 unit PRBC;s. Continue to monitor H/H.  in addition creatinine steady at 1.6. Continue to hold torosemide for 1 day and re-evaluate labs in AM.  Not sure of patient's baseline Cr-- from records varies from 0.9 to 1.6.   Assessment & Plan:   Principal Problem:   Symptomatic anemia Active Problems:   Myocardial infarction [facility])   Atrial fibrillation (HCC)   Iron deficiency anemia   CAD (coronary artery disease)   Gastritis   Gastropathy  Symptomatic iron deficiency anemia in setting of Xarelto:S/P 2 units 10/25.Baseline 9. drifted down to 7.4 this am. Of note xarelto resumed last night. Anemia panel consistent with severe iron deficiency anemia. Colonoscopy about 3 years ago showed diverticulosis. emains hemodynamically stable. Evaluated by gi who opine hx small bowel AVM's is consideration and recommending small bowel evaluation given need for blood thinners. Capsule study scheduled for today -transfuse 1 unit PRBC's -Post transfusion H&H -serial cbc -continue PPI -follow results capsule studay -appreciate assistance GI  Paroxysmal atrial fibrillation:rate controlled.Chads vas score 5. Dig level 1.9 -Continue home amiodarone and digoxin -xarelto resumed last evening  Diastolic STM:[id number] in 10/2017 with EF of 60 to 65%,severely dilated left and right atrium, PAPP of 45.weight up 4lbs. - holding Torsemide today due to creatinine level -Daily weight, intake and output   CAD status post stent in 2012:No anginal symptoms.  EKG with ST depression in inferior leads and anterior leads. No chest pain this am. Troponin slilghtly elevated and flat -Continue statin -Not on beta-blocker due to fatigue and dyspnea per cardiology notes  Elevated creatinineonCKD 3:Serum creatinine stable at1.61.08 on discharge on 814/2019.Unclear if this is AKI or chronic progression. She is ontorsemide and PPI at home. She reports she has never been told she has CKD. -Recheck BMP -holdtoresamide for 1 day -monitor intake and outptu  Chronic back pain:On Robaxin at home -Continue home meds -PT/OT no recommendations beyond in house follow up at ALF    DVT prophylaxis: scd Code Status: full Family Communication: none present Disposition Plan: home when ready   Consultants:   [facility]  Procedures: capsule study  Antimicrobials:     Subjective: "I am concerned with how quickly hg has dropped again". Denies pain/discomfort  Objective: Vitals:   01/21/18 1840 01/21/18 2027 01/22/18 0406 01/22/18 0901  BP: (!) 137/52  (!) 143/57   Pulse: (!) 52 62 61 (!) 58  Resp: 20  (!) 21   Temp:  98 F (36.7 C) 97.6 F (36.4 C)   TempSrc:  Oral Oral   SpO2:  97% 95%   Weight:   83.5 kg   Height:        Intake/Output Summary (Last 24 hours) at 01/22/2018 0911 Last data filed at 01/22/2018 0530 Gross per 24 hour  Intake 360 ml  Output 1125 ml  Net -765 ml   Filed Weights   01/20/18 1740 01/21/18 0424 01/22/18 0406  Weight: 81.6 kg 83.1 kg 83.5 kg    Examination:  General exam: Appears calm and comfortable  Respiratory system: Clear  to auscultation. Respiratory effort normal. Cardiovascular system: S1 & S2 heard, RRR. No JVD, murmurs, rubs, gallops or clicks. No pedal edema. Gastrointestinal system: Abdomen is nondistended, soft and nontender. No organomegaly or masses felt. Normal bowel sounds heard. No guarding or rebounding Central nervous system: Alert and oriented. No focal neurological  deficits. Extremities: Symmetric 5 x 5 power. Skin: No rashes, lesions or ulcers Psychiatry: Judgement and insight appear normal. Mood & affect appropriate.     Data Reviewed: I have personally reviewed following labs and imaging studies  CBC: Recent Labs  Lab 01/20/18 0938 01/21/18 0440 01/22/18 0356  WBC 6.5 6.8 5.9  NEUTROABS 4.6  --   --   HGB 5.3* 8.4* 7.8*  HCT 19.9* 28.4* 26.5*  MCV 71.6* 75.7* 75.7*  PLT 278 275 591   Basic Metabolic Panel: Recent Labs  Lab 01/20/18 0938 01/21/18 0440 01/22/18 0356  NA 138 139 139  K 3.5 3.7 3.5  CL 103 105 105  CO2 26 26 27   GLUCOSE 105* 101* 96  BUN 24* 25* 28*  CREATININE 1.61* 1.68* 1.67*  CALCIUM 8.0* 7.9* 7.9*   GFR: Estimated Creatinine Clearance: 26.8 mL/min (A) (by C-G formula based on SCr of 1.67 mg/dL (H)). Liver Function Tests: Recent Labs  Lab 01/20/18 0938  AST 16  ALT 13  ALKPHOS 91  BILITOT 1.0  PROT 5.1*  ALBUMIN 2.4*   No results for input(s): LIPASE, AMYLASE in the last 168 hours. No results for input(s): AMMONIA in the last 168 hours. Coagulation Profile: No results for input(s): INR, PROTIME in the last 168 hours. Cardiac Enzymes: Recent Labs  Lab 01/20/18 0938 01/20/18 1311 01/20/18 2041 01/21/18 0440  TROPONINI 0.03* 0.04* 0.04* 0.04*   BNP (last 3 results) No results for input(s): PROBNP in the last 8760 hours. HbA1C: No results for input(s): HGBA1C in the last 72 hours. CBG: No results for input(s): GLUCAP in the last 168 hours. Lipid Profile: No results for input(s): CHOL, HDL, LDLCALC, TRIG, CHOLHDL, LDLDIRECT in the last 72 hours. Thyroid Function Tests: No results for input(s): TSH, T4TOTAL, FREET4, T3FREE, THYROIDAB in the last 72 hours. Anemia Panel: Recent Labs    01/20/18 0938  VITAMINB12 239  FOLATE 10.3  FERRITIN 9*  TIBC 476*  IRON 12*  RETICCTPCT 2.6   Urine analysis:    Component Value Date/Time   COLORURINE YELLOW 01/25/2015 2122   APPEARANCEUR CLEAR  01/25/2015 2122   LABSPEC 1.012 01/25/2015 2122   PHURINE 5.5 01/25/2015 2122   GLUCOSEU NEGATIVE 01/25/2015 2122   HGBUR NEGATIVE 01/25/2015 2122   BILIRUBINUR NEGATIVE 01/25/2015 2122   KETONESUR NEGATIVE 01/25/2015 2122   PROTEINUR NEGATIVE 01/25/2015 2122   UROBILINOGEN 0.2 01/25/2015 2122   NITRITE NEGATIVE 01/25/2015 2122   LEUKOCYTESUR NEGATIVE 01/25/2015 2122   Sepsis Labs: @LABRCNTIP (procalcitonin:4,lacticidven:4)  )No results found for this or any previous visit (from the past 240 hour(s)).       Radiology Studies: No results found.      Scheduled Meds: . sodium chloride   Intravenous Once  . amiodarone  200 mg Oral Daily  . digoxin  0.125 mg Oral Daily  . dorzolamide  1 drop Left Eye BID  . latanoprost  1 drop Left Eye QHS  . loratadine  10 mg Oral Daily  . pantoprazole  40 mg Oral Q0600  . potassium chloride  10 mEq Oral Q0600  . rivaroxaban  15 mg Oral Q supper  . rosuvastatin  5 mg Oral QHS  . [START  ON 01/23/2018] torsemide  20 mg Oral BID   Continuous Infusions:   LOS: 0 days    Time spent: 40 minutes    Radene Gunning, NP    If 7PM-7AM, please contact night-coverage [email or web address] Password [email or web address] 01/22/2018, 9:11 AM

## 2018-01-23 ENCOUNTER — Other Ambulatory Visit: Payer: Self-pay

## 2018-01-23 ENCOUNTER — Encounter (HOSPITAL_COMMUNITY): Payer: Self-pay | Admitting: Internal Medicine

## 2018-01-23 DIAGNOSIS — D649 Anemia, unspecified: Secondary | ICD-10-CM

## 2018-01-23 DIAGNOSIS — D500 Iron deficiency anemia secondary to blood loss (chronic): Secondary | ICD-10-CM

## 2018-01-23 LAB — TYPE AND SCREEN
ABO/RH(D): A POS
Antibody Screen: NEGATIVE
UNIT DIVISION: 0
UNIT DIVISION: 0
Unit division: 0

## 2018-01-23 LAB — POCT I-STAT, CHEM 8
BUN: 25 mg/dL — ABNORMAL HIGH (ref 8–23)
CALCIUM ION: 1.07 mmol/L — AB (ref 1.15–1.40)
CHLORIDE: 99 mmol/L (ref 98–111)
Creatinine, Ser: 1.7 mg/dL — ABNORMAL HIGH (ref 0.44–1.00)
GLUCOSE: 112 mg/dL — AB (ref 70–99)
HCT: 19 % — ABNORMAL LOW (ref 36.0–46.0)
Hemoglobin: 6.5 g/dL — CL (ref 12.0–15.0)
POTASSIUM: 3.2 mmol/L — AB (ref 3.5–5.1)
SODIUM: 138 mmol/L (ref 135–145)
TCO2: 26 mmol/L (ref 22–32)

## 2018-01-23 LAB — CBC
HEMATOCRIT: 29 % — AB (ref 36.0–46.0)
HEMOGLOBIN: 8.8 g/dL — AB (ref 12.0–15.0)
MCH: 23.2 pg — AB (ref 26.0–34.0)
MCHC: 30.3 g/dL (ref 30.0–36.0)
MCV: 76.5 fL — ABNORMAL LOW (ref 80.0–100.0)
Platelets: 246 10*3/uL (ref 150–400)
RBC: 3.79 MIL/uL — ABNORMAL LOW (ref 3.87–5.11)
RDW: 21 % — AB (ref 11.5–15.5)
WBC: 7.6 10*3/uL (ref 4.0–10.5)
nRBC: 0.7 % — ABNORMAL HIGH (ref 0.0–0.2)

## 2018-01-23 LAB — BASIC METABOLIC PANEL
Anion gap: 6 (ref 5–15)
BUN: 22 mg/dL (ref 8–23)
CHLORIDE: 105 mmol/L (ref 98–111)
CO2: 25 mmol/L (ref 22–32)
CREATININE: 1.31 mg/dL — AB (ref 0.44–1.00)
Calcium: 8 mg/dL — ABNORMAL LOW (ref 8.9–10.3)
GFR calc Af Amer: 43 mL/min — ABNORMAL LOW (ref >60)
GFR calc non Af Amer: 37 mL/min — ABNORMAL LOW (ref >60)
GLUCOSE: 90 mg/dL (ref 70–99)
POTASSIUM: 3.6 mmol/L (ref 3.5–5.1)
SODIUM: 136 mmol/L (ref 135–145)

## 2018-01-23 LAB — BPAM RBC
BLOOD PRODUCT EXPIRATION DATE: 201911172359
Blood Product Expiration Date: 201911182359
Blood Product Expiration Date: 201911202359
ISSUE DATE / TIME: 201910251202
ISSUE DATE / TIME: 201910252102
ISSUE DATE / TIME: 201910271122
Unit Type and Rh: 6200
Unit Type and Rh: 6200
Unit Type and Rh: 6200

## 2018-01-23 LAB — TISSUE TRANSGLUTAMINASE, IGA: Tissue Transglutaminase Ab, IgA: <2 U/mL (ref 0–3)

## 2018-01-23 LAB — IGA: IGA: 175 mg/dL (ref 64–422)

## 2018-01-23 LAB — MRSA PCR SCREENING: MRSA by PCR: NEGATIVE

## 2018-01-23 MED ORDER — FERROUS SULFATE 325 (65 FE) MG PO TBEC: 325.0000 mg | DELAYED_RELEASE_TABLET | Freq: Two times a day (BID) | ORAL | 3 refills | Status: DC

## 2018-01-23 MED ORDER — RIVAROXABAN 15 MG PO TABS: 15.0000 mg | ORAL_TABLET | Freq: Every day | ORAL | 0 refills | Status: DC

## 2018-01-23 MED ORDER — LOPERAMIDE HCL 2 MG PO CAPS
4.0000 mg | ORAL_CAPSULE | ORAL | Status: DC | PRN
  Administered 2018-01-23: 4 mg via ORAL
  Filled 2018-01-23: qty 2

## 2018-01-23 MED ORDER — CYANOCOBALAMIN 500 MCG PO TABS: 500.0000 ug | ORAL_TABLET | Freq: Every day | ORAL | 0 refills | Status: AC

## 2018-01-23 NOTE — NC FL2 (Signed)
[place] MEDICAID FL2 LEVEL OF CARE SCREENING TOOL     IDENTIFICATION  Patient Name: [name] Birthdate: March 14, 1934 Sex: female Admission Date (Current Location): 01/20/2018  [facility] and [place] Number:  Herbalist and Address:  [facility] [place], [place] [address] [place], [place] [address]      Provider Number: [id number]  Attending Physician Name and Address:  Elgergawy, Silver Huguenin, MD  Relative Name and Phone Number:       Current Level of Care: Hospital Recommended Level of Care: Assisted Living Facility([facility]) Prior Approval Number:    Date Approved/Denied:   PASRR Number:    Discharge Plan: Other (Comment)(return back to ALF)    Current Diagnoses: Patient Active Problem List   Diagnosis Date Noted  . Anemia 01/22/2018  . Iron deficiency anemia 01/20/2018  . Gastritis 01/20/2018  . CAD (coronary artery disease) 01/20/2018  . Atrial fibrillation with RVR ([place]) 11/02/2017  . Symptomatic anemia 11/02/2017  . Atrial fibrillation ([place]) 01/21/2015  . Diarrhea 04/24/2014  . S/P laparoscopic cholecystectomy 02/26/2011  . Hypertension 02/05/2011  . Myocardial infarction ([place]) 02/05/2011  . Hyperlipidemia 02/05/2011  . Gastropathy 02/05/2011  . Anxiety 02/05/2011  . Depression 02/05/2011  . Hepatic steatosis 02/05/2011  . Pulmonary hypertension ([place]) 02/05/2011    Orientation RESPIRATION BLADDER Height & Weight     Self, Situation, Time, Place  Normal Continent Weight: 184 lb 12.8 oz (83.8 kg)(Scale B) Height:  5' 2.5" (158.8 cm)  BEHAVIORAL SYMPTOMS/MOOD NEUROLOGICAL BOWEL NUTRITION STATUS      Continent Diet(heart healthy)  AMBULATORY STATUS COMMUNICATION OF NEEDS Skin   Limited Assist Verbally Normal                       Personal Care Assistance Level of Assistance  Bathing, Feeding, Dressing Bathing Assistance: Independent Feeding assistance: Independent Dressing Assistance: Independent      Functional Limitations Info  Sight, Hearing, Speech Sight Info: Adequate Hearing Info: Adequate Speech Info: Adequate    SPECIAL CARE FACTORS FREQUENCY  PT (By licensed PT), OT (By licensed OT)     PT Frequency: 3x wk OT Frequency: 3x wk            Contractures Contractures Info: Not present    Additional Factors Info  Code Status, Allergies Code Status Info: full code Allergies Info: DILAUDID HYDROMORPHONE HCL, ASPIRIN, LOSARTAN, BACTRIM SULFAMETHOXAZOLE-TRIMETHOPRIM, SULFUR            Current Medications (01/23/2018):  This is the current hospital active medication list Current Facility-Administered Medications  Medication Dose Route Frequency Provider Last Rate Last Dose  . 0.9 %  sodium chloride infusion (Manually program via Guardrails IV Fluids)   Intravenous Once Black, Lezlie Octave, NP      . acetaminophen (TYLENOL) tablet 650 mg  650 mg Oral Q6H PRN Mercy Riding, MD       Or  . acetaminophen (TYLENOL) suppository 650 mg  650 mg Rectal Q6H PRN Wendee Beavers T, MD      . amiodarone (PACERONE) tablet 200 mg  200 mg Oral Daily Wendee Beavers T, MD   200 mg at 01/23/18 0857  . digoxin (LANOXIN) tablet 0.125 mg  0.125 mg Oral Daily Wendee Beavers T, MD   0.125 mg at 01/23/18 0856  . dorzolamide (TRUSOPT) 2 % ophthalmic solution 1 drop  1 drop Left Eye BID Wendee Beavers T, MD   1 drop at 01/23/18 1109  . feeding supplement (ENSURE  ENLIVE) (ENSURE ENLIVE) liquid 237 mL  237 mL Oral BID BM Vann, Jessica U, DO      . hydrocortisone cream 1 %   Topical PRN Vann, Jessica U, DO      . latanoprost (XALATAN) 0.005 % ophthalmic solution 1 drop  1 drop Left Eye QHS Mercy Riding, MD   1 drop at 01/22/18 2107  . loratadine (CLARITIN) tablet 10 mg  10 mg Oral Daily Wendee Beavers T, MD   10 mg at 01/22/18 0901  . methocarbamol (ROBAXIN) tablet 500 mg  500 mg Oral QID PRN Gonfa, Taye T, MD      . nitroGLYCERIN (NITROSTAT) SL tablet 0.4 mg  0.4 mg Sublingual Q5 Min x 3 PRN Gonfa, Taye T, MD      .  ondansetron (ZOFRAN) tablet 4 mg  4 mg Oral Q6H PRN Gonfa, Taye T, MD       Or  . ondansetron (ZOFRAN) injection 4 mg  4 mg Intravenous Q6H PRN Gonfa, Taye T, MD      . pantoprazole (PROTONIX) EC tablet 40 mg  40 mg Oral Q0600 Gonfa, Taye T, MD      . polyvinyl alcohol (LIQUIFILM TEARS) 1.4 % ophthalmic solution 1 drop  1 drop Both Eyes QID PRN Gonfa, Taye T, MD      . potassium chloride (K-DUR,KLOR-CON) CR tablet 10 mEq  10 mEq Oral S2831 Wendee Beavers T, MD   10 mEq at 01/23/18 0649  . Rivaroxaban (XARELTO) tablet 15 mg  15 mg Oral Q supper Lavenia Atlas, RPH   15 mg at 01/22/18 1827  . rosuvastatin (CRESTOR) tablet 5 mg  5 mg Oral QHS Wendee Beavers T, MD   5 mg at 01/22/18 2105  . torsemide (DEMADEX) tablet 20 mg  20 mg Oral BID Eulogio Bear U, DO   20 mg at 01/23/18 0857  . traZODone (DESYREL) tablet 25 mg  25 mg Oral QHS PRN Mercy Riding, MD         Discharge Medications: Please see discharge summary for a list of discharge medications.  Relevant Imaging Results:  Relevant Lab Results:   Additional Information SS# [id number]  Wende Neighbors, LCSW

## 2018-01-23 NOTE — Progress Notes (Signed)
      Progress Note   Subjective  Patient feels well this AM, she wants to go home. She has some chronic diarrhea for which she takes immodium, had a loose stool today. No bleeding noted. Capsule study done yesterday, downloading now.    Objective   Vital signs in last 24 hours: Temp:  [97.6 F (36.4 C)-98.3 F (36.8 C)] 98.3 F (36.8 C) (10/28 0311) Pulse Rate:  [53-66] 66 (10/28 0311) Resp:  [17-26] 17 (10/28 0311) BP: (142-161)/(53-69) 148/69 (10/28 0311) SpO2:  [93 %-100 %] 93 % (10/28 0311) Weight:  [83.8 kg] 83.8 kg (10/28 0311) Last BM Date: 01/22/18 General:    white female in NAD Heart:  Regular rate and rhythm; no murmurs Lungs: Respirations even and unlabored, lungs CTA bilaterally Abdomen:  Soft, nontender and nondistended.  Extremities:  Without edema. Neurologic:  Alert and oriented,  grossly normal neurologically. Psych:  Cooperative. Normal mood and affect.  Intake/Output from previous day: 10/27 0701 - 10/28 0700 In: 1105 [P.O.:790; Blood:315] Out: 500 [Urine:500] Intake/Output this shift: Total I/O In: 240 [P.O.:240] Out: -   Lab Results: Recent Labs    01/22/18 0356 01/22/18 1711 01/23/18 0343  WBC 5.9 7.0 7.6  HGB 7.8* 9.8* 8.8*  HCT 26.5* 32.7* 29.0*  PLT 258 251 246   BMET Recent Labs    01/21/18 0440 01/22/18 0356 01/23/18 0343  NA 139 139 136  K 3.7 3.5 3.6  CL 105 105 105  CO2 26 27 25   GLUCOSE 101* 96 90  BUN 25* 28* 22  CREATININE 1.68* 1.67* 1.31*  CALCIUM 7.9* 7.9* 8.0*   LFT No results for input(s): PROT, ALBUMIN, AST, ALT, ALKPHOS, BILITOT, BILIDIR, IBILI in the last 72 hours. PT/INR No results for input(s): LABPROT, INR in the last 72 hours.  Studies/Results: No results found.     Assessment / Plan:   84 y/o female with iron deficiency anemia, heme negative stool on Xarelto. EGD previously without cause in August and colonoscopy in 2016 without pathology. She was not on iron supplementation and had a slow  downtrend. Now s/p transfusion and IV iron. Capsule endoscopy done, hope to have results in next few days. She has passed nonbloody stools.  Recommend: - I think okay for discharge home today - will get capsule read in next few days and relay report - repeat CBC in one week - repeat IV iron infusion as outpatient in one week - follow up with our office on [facility] (per patient request)  We will coordinate her outpatient follow up. Contact us in the interim with questions.   [place], MD [facility]

## 2018-01-23 NOTE — Care Management Note (Signed)
Case Management Note Marvetta Gibbons RN, BSN Transitions of Care Unit 4E- RN Case Manager [phone]  Patient Details  Name: NUPUR HOHMAN MRN: [id number] Date of Birth: [birth year]  Subjective/Objective:    Pt admitted with symptomatic anemia                Action/Plan: PTA pt lived at [place]- had in house therapies with [facility] which will continue on discharge for PT/OT- have spoken with Ennis Forts at [facility] who will f/u with pt on return to ALF. CSW following for transition of care needs for return to ALF with [facility] needs.   Expected Discharge Date:    01/23/18              Expected Discharge Plan:  Assisted Living / Rest Home  In-House Referral:  Clinical Social Work  Discharge planning Services  CM Consult  Post Acute Care Choice:  Home Health Choice offered to:  Patient  DME Arranged:    DME Agency:     HH Arranged:  PT, OT [place] Agency:  [facility] (formerly [employer])  Status of Service:  Completed, signed off  If discussed at [employer], dates discussed:    Discharge Disposition: Assist Living with [place]   Additional Comments:  Dawayne Patricia, RN 01/23/2018, 1:52 PM

## 2018-01-23 NOTE — Discharge Instructions (Signed)
Information on my medicine - XARELTO (Rivaroxaban)-  You were taking this medication before this hospital admission.   The dose of Xarelto (Rivaroxaban) has been appropriately  reduced to 15 mg once daily.  This medication education was reviewed with me or my healthcare representative as part of my discharge preparation.   Why was Xarelto prescribed for you? Xarelto was prescribed for you to reduce the risk of a blood clot forming that can cause a stroke if you have a medical condition called atrial fibrillation (a type of irregular heartbeat).  What do you need to know about xarelto ? Take your Xarelto ONCE DAILY at the same time every day with your evening meal. If you have difficulty swallowing the tablet whole, you may crush it and mix in applesauce just prior to taking your dose.  Take Xarelto exactly as prescribed by your doctor and DO NOT stop taking Xarelto without talking to the doctor who prescribed the medication.  Stopping without other stroke prevention medication to take the place of Xarelto may increase your risk of developing a clot that causes a stroke.  Refill your prescription before you run out.  After discharge, you should have regular check-up appointments with your healthcare provider that is prescribing your Xarelto.  In the future your dose may need to be changed if your kidney function or weight changes by a significant amount.  What do you do if you miss a dose? If you are taking Xarelto ONCE DAILY and you miss a dose, take it as soon as you remember on the same day then continue your regularly scheduled once daily regimen the next day. Do not take two doses of Xarelto at the same time or on the same day.   Important Safety Information A possible side effect of Xarelto is bleeding. You should call your healthcare provider right away if you experience any of the following: ? Bleeding from an injury or your nose that does not stop. ? Unusual colored urine (red  or dark brown) or unusual colored stools (red or black). ? Unusual bruising for unknown reasons. ? A serious fall or if you hit your head (even if there is no bleeding).  Some medicines may interact with Xarelto and might increase your risk of bleeding while on Xarelto. To help avoid this, consult your healthcare provider or pharmacist prior to using any new prescription or non-prescription medications, including herbals, vitamins, non-steroidal anti-inflammatory drugs (NSAIDs) and supplements.  This website has more information on Xarelto: [email or web address]

## 2018-01-23 NOTE — Discharge Summary (Addendum)
[name], is a 84 y.o. female  DOB [birth year]  MRN [id number].  Admission date:  01/20/2018  Admitting Physician  [name], MD  Discharge Date:  02/01/2018   Primary MD  Jani Gravel, MD  Recommendations for primary care physician for things to follow:  -Please check CBC, BMP in 1 week to ensure improvement of renal function and stable hemoglobin -patient will need IV iron transfusion in 1 week from discharge -will need  follow with GI as outpatient   Admission Diagnosis  Severe anemia [D64.9]   Discharge Diagnosis  Severe anemia [D64.9]    Principal Problem:   Symptomatic anemia Active Problems:   Gastropathy   Atrial fibrillation (HCC)   Iron deficiency anemia   Gastritis   CAD (coronary artery disease)   Anemia      Past Medical History:  Diagnosis Date  . Acalculous cholecystitis   . Arthritis   . Blood transfusion   . Blood transfusion without reported diagnosis   . Cataract    bilateral-removed from both eyes  . Coronary artery disease    mi  7/12,  stress test 12/21/2011=>negative for ischemia [name])  . Diabetes mellitus    pt states she is not diabetic  . Diverticulitis   . Dizzy   . Dyslipidemia   . Fatigue   . GERD (gastroesophageal reflux disease)   . Glaucoma   . Hypertension   . Hypokalemia   . Mitral regurgitation    mild to moderate, cardiac echo 12/02/2011, EF 51% [name])  . Myocardial infarction ([place])    7/12  . PONV (postoperative nausea and vomiting)    yrs ago  . Shortness of breath     Past Surgical History:  Procedure Laterality Date  . ABDOMINAL HYSTERECTOMY    . BILIARY DRAINAGE  9/12  . BIOPSY  11/04/2017   Procedure: BIOPSY;  Surgeon: Jackquline Denmark, MD;  Location: [facility] ENDOSCOPY;  Service: Endoscopy;;  . CARDIOVERSION N/A 02/12/2015   Procedure: CARDIOVERSION;  Surgeon: [name], MD;  Location: [facility];  Service:  Cardiovascular;  Laterality: N/A;  . CARDIOVERSION N/A 11/10/2017   Procedure: CARDIOVERSION;  Surgeon: Nigel Mormon, MD;  Location: [facility] ENDOSCOPY;  Service: Cardiovascular;  Laterality: N/A;  . CATARACT EXTRACTION     2  . CHOLECYSTECTOMY  02/25/2011   Procedure: LAPAROSCOPIC CHOLECYSTECTOMY WITH INTRAOPERATIVE CHOLANGIOGRAM;  Surgeon: Gayland Curry, MD;  Location: [place];  Service: General;  Laterality: N/A;  . CORONARY ANGIOPLASTY WITH STENT PLACEMENT  09/24/10   drug eluting stents  . ESOPHAGOGASTRODUODENOSCOPY (EGD) WITH PROPOFOL N/A 11/04/2017   Procedure: ESOPHAGOGASTRODUODENOSCOPY (EGD) WITH PROPOFOL;  Surgeon: Jackquline Denmark, MD;  Location: [facility] ENDOSCOPY;  Service: Endoscopy;  Laterality: N/A;  . GIVENS CAPSULE STUDY N/A 01/22/2018   Procedure: GIVENS CAPSULE STUDY;  Surgeon: Gatha Mayer, MD;  Location: [facility];  Service: Endoscopy;  Laterality: N/A;  . PARATHYROIDECTOMY     tumor excision  . SHOULDER SURGERY     right  . SIGMOIDOSCOPY  flex sig in the 80's per pt.       History of present illness and  Hospital Course:     Kindly see H&P for history of present illness and admission details, please review complete Labs, Consult reports and Test reports for all details in brief  HPI  from the history and physical done on the day of admission 01/20/2018  HPI: [name] is a 84 y.o. female with medical history significant for CAD status post stent in 2012, paroxysmal atrial fibrillation on amiodarone and Xarelto, anemia, gastritis and diastolic CHF (EF 60 to 41%) in 10/2017 who presented to ED for planned cardioversion for paroxysmal atrial fibrillation and found to be anemic to 5.3.  Patient reports dizziness for the last 3 weeks.  She describes it as "I am going to faint".  She denies vertigo.  She states she was told to be back in A. fib by a cardiologist (Dr. Einar Gip) about 3 weeks ago.  She also reports significant lack of energy over the last 3 weeks.   She denies chest pain or dyspnea.  She denies hematochezia or melena.  She says she has chronic diarrhea for years.  He had a colonoscopy in 05/2014 that showed two sessile polyps in the ascending colon and moderately severe diverticulosis of sigmoid and ascending colon.  The polyps were snared.  Pathology showed tubular adenoma without malignancy. Of note, patient had similar presentation in August with hemoglobin down to 8.  She had an EGD that showed erosive gastropathy.  She was transfused and discharged home with hemoglobin of 9 g/dL.  In ED, vital signs within normal limits.  In IV without RVR.  EKG with downslanting ST wave.  (Patient is on digoxin) initial troponin 0 0.03.  Hemoglobin down to 5.3.  Anemia panel with severe iron deficiency.  FOBT negative.  Typed and crossed.  2 units of blood ordered by ED provider.  I was called to admit patient for further management.   Hospital Course   Symptomatic iron deficiency anemiain setting of Xarelto: -S/P 3 units during hospital stay, plan around 9, 10/25.Baseline 9. Hemoglobin discharge around 8.8, GI and pulled appreciated,aemia panel consistent with severe iron deficiency anemia. Colonoscopy about 3 years ago showed diverticulosis.  Had capsule endoscopy done, results to be reviewed by GI as an outpatient, she is cleared to be discharged from their side, she received IV iron giving her anemia work-up significant for iron deficiency anemia, she will be discharged on oral supplement and will need another IV iron in 1 week.  Paroxysmal atrial fibrillation:rate controlled.Chads vas score 5. Dig level 1.9 -Continue home amiodarone and digoxin -xarelto resumed, but dose has been adjusted to her renal function, change to 15 mg oral daily  Diastolic PFX:[id number] in 10/2017 with EF of 60 to 65%,severely dilated left and right atrium, PAPP of 45.weight up 4lbs. -Resume torsemide on discharge  CAD status post stent in 2012:No anginal  symptoms. EKG with ST depression in inferior leads and anterior leads.No chest pain this am. Troponin slilghtly elevated and flat -Continue statin -Not on beta-blocker due to fatigue and dyspnea per cardiology notes  AKIonCKD 3: -Serum creatinine 1.6 on admission, baseline around 1, this is most likely prerenal due to anemia, improved, it is 1.3 on discharge, avoid nephrotoxic medications, new with home dose diuresis, Xarelto dose has been adjusted .  Chronic back pain:On Robaxin at home -Continue home meds -PT/OT no recommendations beyond in house follow up at [facility]    Discharge  Condition:  stable   Follow UP  Follow-up Information    Willia Craze, NP Follow up on 02/10/2018.   Specialty:  Gastroenterology Why:  9 AM follow up with gastro nurse practitioner for Dr Hilarie Fredrickson.   Contact information: [place] [address] [phone]             Discharge Instructions  and  Discharge Medications     Allergies as of 01/23/2018      Reactions   Dilaudid [hydromorphone Hcl] Other (See Comments)   Caused patient psychological disturbances    Aspirin    On xalerto   Losartan Other (See Comments)   Per MAR   Bactrim [sulfamethoxazole-trimethoprim] Rash   Sulfur Rash      Medication List    STOP taking these medications   diltiazem 180 MG 24 hr capsule Commonly known as:  CARDIZEM CD     TAKE these medications   amiodarone 200 MG tablet Commonly known as:  PACERONE Take 1 tablet (200 mg total) by mouth 2 (two) times daily. What changed:    how much to take  when to take this  additional instructions Notes to patient:  Last dose 01/23/2018 @ 0900   clotrimazole-betamethasone cream Commonly known as:  LOTRISONE Apply 1 application topically 2 (two) times daily as needed (itching). Affected area Notes to patient:  No recent doses, take as ordered.   digoxin 0.125 MG tablet Commonly known as:  LANOXIN Take 0.125 mg by mouth  daily. (0800)   dorzolamide 2 % ophthalmic solution Commonly known as:  TRUSOPT Place 1 drop into the left eye 2 (two) times daily. (0800 & 1900) Notes to patient:  Last dose 01/23/2018 @ 1100   ergocalciferol 1.25 MG (50000 UT) capsule Commonly known as:  VITAMIN D2 Take 50,000 Units by mouth every Monday. (0800)   ferrous sulfate 325 (65 FE) MG EC tablet Take 1 tablet (325 mg total) by mouth 2 (two) times daily.   hydrocortisone cream 1 % Apply topically 3 (three) times daily as needed for itching. What changed:  how much to take Notes to patient:  Take as ordered.   latanoprost 0.005 % ophthalmic solution Commonly known as:  XALATAN Place 1 drop into the left eye at bedtime. (1900)   loratadine 10 MG tablet Commonly known as:  CLARITIN Take 10 mg by mouth daily. (0800) Notes to patient:  Dose refused this morning, patient to discuss medication regimen with physician.   methocarbamol 500 MG tablet Commonly known as:  ROBAXIN Take 500 mg by mouth 4 (four) times daily as needed (pain). Notes to patient:  No recent doses, take as ordered.   nitroGLYCERIN 0.4 MG SL tablet Commonly known as:  NITROSTAT Place 0.4 mg under the tongue every 5 (five) minutes x 3 doses as needed for chest pain. Notes to patient:  No recent doses, take as ordered.   pantoprazole 40 MG tablet Commonly known as:  PROTONIX Take 1 tablet (40 mg total) by mouth daily at 6 (six) AM. Notes to patient:  Dose refused this morning, patient to discuss medication with physician.   potassium chloride 10 MEQ tablet Commonly known as:  K-DUR Take 1 tablet (10 mEq total) by mouth daily. What changed:  when to take this   PRESERVISION AREDS 2 Caps Take 1 capsule by mouth 2 (two) times daily. Notes to patient:  No recent doses, take as ordered.   Rivaroxaban 15 MG Tabs tablet Commonly known as:  XARELTO Take 1 tablet (15 mg total) by mouth daily with supper. What changed:    medication strength  how  much to take  additional instructions   rosuvastatin 5 MG tablet Commonly known as:  CRESTOR Take 5 mg by mouth at bedtime. (2000)   SYSTANE BALANCE OP Apply 1 drop to eye 4 (four) times daily as needed (dry eyes). Notes to patient:  No recent doses, take as ordered.   torsemide 20 MG tablet Commonly known as:  DEMADEX Take 20 mg by mouth daily as needed (at 2pm if needed for weight gain >3 lbs). What changed:  Another medication with the same name was changed. Make sure you understand how and when to take each. Notes to patient:  Take as ordered.   torsemide 20 MG tablet Commonly known as:  DEMADEX Take 1 tablet (20 mg total) by mouth 2 (two) times daily. What changed:    when to take this  additional instructions   vitamin B-12 500 MCG tablet Commonly known as:  CYANOCOBALAMIN Take 1 tablet (500 mcg total) by mouth daily.         Diet and Activity recommendation: See Discharge Instructions above   Consults obtained -  GI   Major procedures and Radiology Reports - PLEASE review detailed and final reports for all details, in brief -   Capsule endoscopy   No results found.  Micro Results     Recent Results (from the past 240 hour(s))  MRSA PCR Screening     Status: None   Collection Time: 01/23/18  2:50 AM  Result Value Ref Range Status   MRSA by PCR NEGATIVE NEGATIVE Final    Comment:        The GeneXpert MRSA Assay (FDA approved for NASAL specimens only), is one component of a comprehensive MRSA colonization surveillance program. It is not intended to diagnose MRSA infection nor to guide or monitor treatment for MRSA infections. Performed at [facility], [place] [address] [place], [place] [address]        Today   Subjective:   [name] today has no headache,no chest or abdominal pain, she had bowel movement this morning, normal in color and consistency Objective:   Blood pressure (!) 148/69, pulse 66, temperature 98.3 F  (36.8 C), temperature source Oral, resp. rate 17, height 5' 2.5" (1.588 m), weight 83.8 kg, SpO2 93 %.  No intake or output data in the 24 hours ending 02/01/18 1739  Exam Awake Alert, Oriented x 3, No new F.N deficits, Normal affect Symmetrical Chest wall movement, Good air movement bilaterally, CTAB Irregular,No Gallops,Rubs or new Murmurs, No Parasternal Heave +ve B.Sounds, Abd Soft, Non tender, No organomegaly appriciated, No rebound -guarding or rigidity. No Cyanosis, Clubbing or edema, No new Rash or bruise  Data Review   CBC w Diff:  Lab Results  Component Value Date   WBC 7.6 01/23/2018   HGB 8.8 (L) 01/23/2018   HCT 29.0 (L) 01/23/2018   PLT 246 01/23/2018   LYMPHOPCT 23 01/20/2018   MONOPCT 3 01/20/2018   EOSPCT 2 01/20/2018   BASOPCT 1 01/20/2018    CMP:  Lab Results  Component Value Date   NA 136 01/23/2018   K 3.6 01/23/2018   CL 105 01/23/2018   CO2 25 01/23/2018   BUN 22 01/23/2018   CREATININE 1.31 (H) 01/23/2018   PROT 5.1 (L) 01/20/2018   ALBUMIN 2.4 (L) 01/20/2018   BILITOT 1.0 01/20/2018   ALKPHOS 91 01/20/2018  AST 16 01/20/2018   ALT 13 01/20/2018  .   Total Time in preparing paper work, data evaluation and todays exam - 44 minutes  Phillips Climes M.D on 02/01/2018 at 5:39 PM  [facility]   Office  [phone]

## 2018-01-23 NOTE — [place] FL2 (Signed)
[place] MEDICAID FL2 LEVEL OF CARE SCREENING TOOL     IDENTIFICATION  Patient Name: [name] Birthdate: Mar 12, 1934 Sex: female Admission Date (Current Location): 01/20/2018  [facility] and [place] Number:  Herbalist and Address:  [facility], [place] [address] [place], [place] [address]      Provider Number: [id number]  Attending Physician Name and Address:  Elgergawy, Silver Huguenin, MD  Relative Name and Phone Number:       Current Level of Care: Hospital Recommended Level of Care: Assisted Living Facility([facility]) Prior Approval Number:    Date Approved/Denied:   PASRR Number:    Discharge Plan: Other (Comment)(return back to ALF)    Current Diagnoses: Patient Active Problem List   Diagnosis Date Noted  . Anemia 01/22/2018  . Iron deficiency anemia 01/20/2018  . Gastritis 01/20/2018  . CAD (coronary artery disease) 01/20/2018  . Atrial fibrillation with RVR ([place]) 11/02/2017  . Symptomatic anemia 11/02/2017  . Atrial fibrillation ([place]) 01/21/2015  . Diarrhea 04/24/2014  . S/P laparoscopic cholecystectomy 02/26/2011  . Hypertension 02/05/2011  . Myocardial infarction ([place]) 02/05/2011  . Hyperlipidemia 02/05/2011  . Gastropathy 02/05/2011  . Anxiety 02/05/2011  . Depression 02/05/2011  . Hepatic steatosis 02/05/2011  . Pulmonary hypertension ([place]) 02/05/2011    Orientation RESPIRATION BLADDER Height & Weight     Self, Situation, Time, Place  Normal Continent Weight: 184 lb 12.8 oz (83.8 kg)(Scale B) Height:  5' 2.5" (158.8 cm)  BEHAVIORAL SYMPTOMS/MOOD NEUROLOGICAL BOWEL NUTRITION STATUS      Continent Diet(heart healthy)  AMBULATORY STATUS COMMUNICATION OF NEEDS Skin   Limited Assist Verbally Normal                       Personal Care Assistance Level of Assistance  Bathing, Feeding, Dressing Bathing Assistance: Independent Feeding assistance: Independent Dressing Assistance: Independent      Functional Limitations Info  Sight, Hearing, Speech Sight Info: Adequate Hearing Info: Adequate Speech Info: Adequate    SPECIAL CARE FACTORS FREQUENCY  PT (By licensed PT), OT (By licensed OT)(home health pt/ot)     PT Frequency: home health pt 3x wk OT Frequency: home health ot 3x wk            Contractures Contractures Info: Not present    Additional Factors Info  Code Status, Allergies Code Status Info: full code Allergies Info: DILAUDID HYDROMORPHONE HCL, ASPIRIN, LOSARTAN, BACTRIM SULFAMETHOXAZOLE-TRIMETHOPRIM, SULFUR            Current Medications (01/23/2018):  This is the current hospital active medication list Current Facility-Administered Medications  Medication Dose Route Frequency Provider Last Rate Last Dose  . 0.9 %  sodium chloride infusion (Manually program via Guardrails IV Fluids)   Intravenous Once Black, Lezlie Octave, NP      . acetaminophen (TYLENOL) tablet 650 mg  650 mg Oral Q6H PRN Mercy Riding, MD       Or  . acetaminophen (TYLENOL) suppository 650 mg  650 mg Rectal Q6H PRN Wendee Beavers T, MD      . amiodarone (PACERONE) tablet 200 mg  200 mg Oral Daily Wendee Beavers T, MD   200 mg at 01/23/18 0857  . digoxin (LANOXIN) tablet 0.125 mg  0.125 mg Oral Daily Wendee Beavers T, MD   0.125 mg at 01/23/18 0856  . dorzolamide (TRUSOPT) 2 % ophthalmic solution 1 drop  1 drop Left Eye BID Mercy Riding, MD   1 drop  at 01/23/18 1109  . feeding supplement (ENSURE ENLIVE) (ENSURE ENLIVE) liquid 237 mL  237 mL Oral BID BM Vann, Jessica U, DO      . hydrocortisone cream 1 %   Topical PRN Vann, Jessica U, DO      . latanoprost (XALATAN) 0.005 % ophthalmic solution 1 drop  1 drop Left Eye QHS Mercy Riding, MD   1 drop at 01/22/18 2107  . loperamide (IMODIUM) capsule 4 mg  4 mg Oral PRN Elgergawy, Silver Huguenin, MD      . loratadine (CLARITIN) tablet 10 mg  10 mg Oral Daily Wendee Beavers T, MD   10 mg at 01/22/18 0901  . methocarbamol (ROBAXIN) tablet 500 mg  500 mg Oral QID  PRN Gonfa, Taye T, MD      . nitroGLYCERIN (NITROSTAT) SL tablet 0.4 mg  0.4 mg Sublingual Q5 Min x 3 PRN Gonfa, Taye T, MD      . ondansetron (ZOFRAN) tablet 4 mg  4 mg Oral Q6H PRN Gonfa, Taye T, MD       Or  . ondansetron (ZOFRAN) injection 4 mg  4 mg Intravenous Q6H PRN Gonfa, Taye T, MD      . pantoprazole (PROTONIX) EC tablet 40 mg  40 mg Oral Q0600 Gonfa, Taye T, MD      . polyvinyl alcohol (LIQUIFILM TEARS) 1.4 % ophthalmic solution 1 drop  1 drop Both Eyes QID PRN Gonfa, Taye T, MD      . potassium chloride (K-DUR,KLOR-CON) CR tablet 10 mEq  10 mEq Oral Q2229 Wendee Beavers T, MD   10 mEq at 01/23/18 0649  . Rivaroxaban (XARELTO) tablet 15 mg  15 mg Oral Q supper Lavenia Atlas, RPH   15 mg at 01/22/18 1827  . rosuvastatin (CRESTOR) tablet 5 mg  5 mg Oral QHS Wendee Beavers T, MD   5 mg at 01/22/18 2105  . torsemide (DEMADEX) tablet 20 mg  20 mg Oral BID Eulogio Bear U, DO   20 mg at 01/23/18 0857  . traZODone (DESYREL) tablet 25 mg  25 mg Oral QHS PRN Mercy Riding, MD         Discharge Medications: Please see discharge summary for a list of discharge medications.  Relevant Imaging Results:  Relevant Lab Results:   Additional Information SS# [id number]  Wende Neighbors, LCSW

## 2018-01-23 NOTE — Progress Notes (Signed)
Clinical Social Worker facilitated patient discharge including contacting patient family and facility to confirm patient discharge plans.  Clinical information faxed to facility and family agreeable with plan. Per patient, adult son will pick her up from hospital and transport her back to [place] after 2:30pm.  RN to call [phone] for report prior to discharge.  Clinical Social Worker will sign off for now as social work intervention is no longer needed. Please consult [place] again if new need arises.  Rhea Pink, MSW, [place]

## 2018-01-23 NOTE — Progress Notes (Signed)
ANTICOAGULATION CONSULT NOTE - Follow up Pharmacy Consult for Xarelto Indication: atrial fibrillation  Allergies  Allergen Reactions  . Dilaudid [Hydromorphone Hcl] Other (See Comments)    Caused patient psychological disturbances   . Aspirin     On xalerto  . Losartan Other (See Comments)    Per MAR  . Bactrim [Sulfamethoxazole-Trimethoprim] Rash  . Sulfur Rash    Patient Measurements: Height: 5' 2.5" (158.8 cm) Weight: 184 lb 12.8 oz (83.8 kg)(Scale B) IBW/kg (Calculated) : 51.25  Vital Signs: Temp: 98.3 F (36.8 C) (10/28 0311) Temp Source: Oral (10/28 0311) BP: 148/69 (10/28 0311) Pulse Rate: 66 (10/28 0311)  Labs: Recent Labs    01/20/18 1311 01/20/18 2041  01/21/18 0440 01/22/18 0356 01/22/18 1711 01/23/18 0343  HGB  --   --    < > 8.4* 7.8* 9.8* 8.8*  HCT  --   --    < > 28.4* 26.5* 32.7* 29.0*  PLT  --   --    < > 275 258 251 246  CREATININE  --   --   --  1.68* 1.67*  --  1.31*  TROPONINI 0.04* 0.04*  --  0.04*  --   --   --    < > = values in this interval not displayed.    Estimated Creatinine Clearance: 34.2 mL/min (A) (by C-G formula based on SCr of 1.31 mg/dL (H)).   Medical History: Past Medical History:  Diagnosis Date  . Acalculous cholecystitis   . Arthritis   . Blood transfusion   . Blood transfusion without reported diagnosis   . Cataract    bilateral-removed from both eyes  . Coronary artery disease    mi  7/12,  stress test 12/21/2011=>negative for ischemia [name])  . Diabetes mellitus    pt states she is not diabetic  . Diverticulitis   . Dizzy   . Dyslipidemia   . Fatigue   . GERD (gastroesophageal reflux disease)   . Glaucoma   . Hypertension   . Hypokalemia   . Mitral regurgitation    mild to moderate, cardiac echo 12/02/2011, EF 51% [name])  . Myocardial infarction ([place])    7/12  . PONV (postoperative nausea and vomiting)    yrs ago  . Shortness of breath     Medications:  Medications Prior to Admission   Medication Sig Dispense Refill Last Dose  . amiodarone (PACERONE) 200 MG tablet Take 1 tablet (200 mg total) by mouth 2 (two) times daily. (Patient taking differently: Take 200 mg by mouth daily. (0800)) 60 tablet 2 01/19/2018 at Unknown time  . clotrimazole-betamethasone (LOTRISONE) cream Apply 1 application topically 2 (two) times daily as needed (itching). Affected area  0 01/19/2018 at Unknown time  . digoxin (LANOXIN) 0.125 MG tablet Take 0.125 mg by mouth daily. (0800)   01/19/2018 at Unknown time  . dorzolamide (TRUSOPT) 2 % ophthalmic solution Place 1 drop into the left eye 2 (two) times daily. (0800 & 1900)   01/19/2018 at Unknown time  . ergocalciferol (VITAMIN D2) 50000 units capsule Take 50,000 Units by mouth every Monday. (0800)   01/19/2018 at Unknown time  . hydrocortisone cream 1 % Apply topically 3 (three) times daily as needed for itching. (Patient taking differently: Apply 1 application topically 3 (three) times daily as needed for itching. ) 30 g 0 01/19/2018 at Unknown time  . latanoprost (XALATAN) 0.005 % ophthalmic solution Place 1 drop into the left eye at bedtime. (1900)  11  01/19/2018 at Unknown time  . loratadine (CLARITIN) 10 MG tablet Take 10 mg by mouth daily. (0800)   01/19/2018 at Unknown time  . methocarbamol (ROBAXIN) 500 MG tablet Take 500 mg by mouth 4 (four) times daily as needed (pain).    01/19/2018 at Unknown time  . Multiple Vitamins-Minerals (PRESERVISION AREDS 2) CAPS Take 1 capsule by mouth 2 (two) times daily.   01/19/2018 at Unknown time  . nitroGLYCERIN (NITROSTAT) 0.4 MG SL tablet Place 0.4 mg under the tongue every 5 (five) minutes x 3 doses as needed for chest pain.     . pantoprazole (PROTONIX) 40 MG tablet Take 1 tablet (40 mg total) by mouth daily at 6 (six) AM. 30 tablet 2 01/19/2018 at Unknown time  . potassium chloride (K-DUR) 10 MEQ tablet Take 1 tablet (10 mEq total) by mouth daily. (Patient taking differently: Take 10 mEq by mouth daily at 6  (six) AM. ) 30 tablet 2 01/19/2018 at Unknown time  . Propylene Glycol (SYSTANE BALANCE OP) Apply 1 drop to eye 4 (four) times daily as needed (dry eyes).    01/19/2018 at Unknown time  . rivaroxaban (XARELTO) 20 MG TABS tablet Take 20 mg by mouth daily with supper. (1900)   01/19/2018 at Unknown time  . rosuvastatin (CRESTOR) 5 MG tablet Take 5 mg by mouth at bedtime. (2000)   01/19/2018 at Unknown time  . torsemide (DEMADEX) 20 MG tablet Take 1 tablet (20 mg total) by mouth 2 (two) times daily. (Patient taking differently: Take 20 mg by mouth daily at 6 (six) AM. (0600)) 60 tablet 2 01/19/2018 at Unknown time  . torsemide (DEMADEX) 20 MG tablet Take 20 mg by mouth daily as needed (at 2pm if needed for weight gain >3 lbs).   01/19/2018 at Unknown time  . diltiazem (CARDIZEM CD) 180 MG 24 hr capsule Take 1 capsule (180 mg total) by mouth daily. (Patient not taking: Reported on 01/16/2018) 30 capsule 2 Not Taking at Unknown time    Assessment: 24 YOF with symptomatic iron deficiency anemia presented with a hemoglobin of 5.3 on admission and received 2 units of PRBCs yesterday. She is on Xarelto prior to admission which was held on admission. Per GI service, this is not a GI hemorrhage and rivaroxaban was restarted on 01/21/18.  MD called pharmacist today for discharge dose recommendation for xarelto. Patient was on 20 mg daily PTA however with AKI we resumed Xarelto at lower dose of 15 mg daily.  Today the Scr has improved. SCr 1.31,  CrCl is 45 ml/min (based on TBW for rivaroxaban dosing).  As CrCl is between 15-50 ml/min, the recommended dose for Afib is 15 mg daily.  H/H low/stable after transfusion on 10/27.  Plt wnl.    Goal of Therapy:  Stroke prevention Monitor platelets by anticoagulation protocol: Yes   Plan:  Rivaroxaban 15 mg daily with supper given CrCl 45 ml/mn Monitor for improvement in renal function  Monitor for s/s of bleeding   Nicole Cella, RPh Clinical  Pharmacist Clinical phone for 01/21/18 until 3:30pm: [id number] If after 3:30pm, please refer to [facility] for unit-specific pharmacist

## 2018-01-23 NOTE — Clinical Social Work Note (Signed)
Clinical Social Work Assessment  Patient Details  Name: [name] MRN: [id number] Date of Birth: 07-09-1933  Date of referral:  01/23/18               Reason for consult:  Discharge Planning                Permission sought to share information with:  Family Supports Permission granted to share information::  Yes, Verbal Permission Granted  Name::     Annita Ratliff  Agency::  [facility]  Relationship::  son  Contact Information:  [phone]  Housing/Transportation Living arrangements for the past 2 months:  [place] of Information:  Patient Patient Interpreter Needed:  None Criminal Activity/Legal Involvement Pertinent to Current Situation/Hospitalization:  No - Comment as needed Significant Relationships:  Adult Children, Community Support Lives with:  Facility Resident Do you feel safe going back to the place where you live?  Yes Need for family participation in patient care:  Yes (Comment)  Care giving concerns:  No family at bedside. Patient stated she is from [place] and has been at facility for years. Patient has support from facility and two adult sons in the area   Social Worker assessment / plan:  CSW met patient at bedside to discuss discharge plan and offer support. Patient stated she recognize CSW from her previous admissions. Patient stated she will be returning back to [facility] and her son will transport her. Patient stated before she returns back to ALF she would like to discuss son medication changes with MD.  Employment status:  Retired Forensic scientist:  Medicare PT Recommendations:  Home with [place] / Referral to community resources:  Other (Comment Required)  Patient/Family's Response to care:  Patient stated she has had a great experience with the medical team during this admission and thanked CSW for role in care  Patient/Family's Understanding of and Emotional Response to Diagnosis,  Current Treatment, and Prognosis:  Patient will return to [facility]  Emotional Assessment Appearance:  Appears stated age Attitude/Demeanor/Rapport:  Engaged Affect (typically observed):  Accepting Orientation:  Oriented to Self, Oriented to Place, Oriented to  Time, Oriented to Situation Alcohol / Substance use:  Not Applicable Psych involvement (Current and /or in the community):  No (Comment)  Discharge Needs  Concerns to be addressed:  Care Coordination Readmission within the last 30 days:  No Current discharge risk:  None Barriers to Discharge:  No Barriers Identified   Wende Neighbors, LCSW 01/23/2018, 1:59 PM

## 2018-01-24 ENCOUNTER — Telehealth: Payer: Self-pay

## 2018-01-24 NOTE — Telephone Encounter (Signed)
-----   Message from Willia Craze, NP sent at 01/22/2018 11:50 AM EDT ----- Eustaquio Maize, please help coordinate patient's 2nd Feraheme dose in one week. Initial dose given today (Sunday). Thanks

## 2018-01-24 NOTE — Telephone Encounter (Signed)
Son contacted with appointment information for the 2nd Feraheme infusion at [facility] 01/31/18 at 11:00 am. Phone number updated to indicate the patient's contact information.

## 2018-01-27 ENCOUNTER — Telehealth: Payer: Self-pay

## 2018-01-27 ENCOUNTER — Other Ambulatory Visit: Payer: Self-pay

## 2018-01-27 DIAGNOSIS — D649 Anemia, unspecified: Secondary | ICD-10-CM

## 2018-01-27 DIAGNOSIS — D500 Iron deficiency anemia secondary to blood loss (chronic): Secondary | ICD-10-CM

## 2018-01-27 NOTE — Telephone Encounter (Signed)
Patient has already been scheduled for feraheme at [facility] 01/31/18 and she is aware.  She is going for labs at her primary and will ask that they draw the CBC.  She will let me know on Monday if they were not able to draw CBC and we will have it drawn at her feraheme appointment.

## 2018-01-27 NOTE — Telephone Encounter (Signed)
-----   Message from Yetta Flock, MD sent at 01/23/2018 10:24 AM EDT ----- Regarding: follow up [place] [name] I think this may be Carl's patient. To be discharged today, she will need CBC in one week and another IV iron infusion in a week (he may have already messaged you about this) Amy she had an inpatient capsule done if you have a chance to look at it in the upcoming few days.  Thanks! Richardson Landry

## 2018-01-30 DIAGNOSIS — I10 Essential (primary) hypertension: Secondary | ICD-10-CM | POA: Diagnosis not present

## 2018-01-30 DIAGNOSIS — I2510 Atherosclerotic heart disease of native coronary artery without angina pectoris: Secondary | ICD-10-CM | POA: Diagnosis not present

## 2018-01-30 DIAGNOSIS — N390 Urinary tract infection, site not specified: Secondary | ICD-10-CM | POA: Diagnosis not present

## 2018-01-30 DIAGNOSIS — E7800 Pure hypercholesterolemia, unspecified: Secondary | ICD-10-CM | POA: Diagnosis not present

## 2018-01-30 DIAGNOSIS — I4819 Other persistent atrial fibrillation: Secondary | ICD-10-CM | POA: Diagnosis not present

## 2018-01-30 DIAGNOSIS — E119 Type 2 diabetes mellitus without complications: Secondary | ICD-10-CM | POA: Diagnosis not present

## 2018-01-30 DIAGNOSIS — I5032 Chronic diastolic (congestive) heart failure: Secondary | ICD-10-CM | POA: Diagnosis not present

## 2018-01-31 ENCOUNTER — Ambulatory Visit (HOSPITAL_COMMUNITY)
Admission: RE | Admit: 2018-01-31 | Discharge: 2018-01-31 | Disposition: A | Discharge status: Home / Self Care | Payer: Medicare Other | Source: Ambulatory Visit | Attending: Nurse Practitioner | Admitting: Nurse Practitioner

## 2018-01-31 DIAGNOSIS — D649 Anemia, unspecified: Secondary | ICD-10-CM | POA: Diagnosis not present

## 2018-01-31 DIAGNOSIS — D500 Iron deficiency anemia secondary to blood loss (chronic): Secondary | ICD-10-CM | POA: Diagnosis not present

## 2018-01-31 MED ORDER — SODIUM CHLORIDE 0.9 % IV SOLN
510.0000 mg | Freq: Once | INTRAVENOUS | Status: AC
  Administered 2018-01-31: 510 mg via INTRAVENOUS
  Filled 2018-01-31: qty 17

## 2018-02-01 ENCOUNTER — Encounter (HOSPITAL_COMMUNITY): Payer: No Typology Code available for payment source

## 2018-02-06 DIAGNOSIS — I4891 Unspecified atrial fibrillation: Secondary | ICD-10-CM | POA: Diagnosis not present

## 2018-02-06 DIAGNOSIS — I10 Essential (primary) hypertension: Secondary | ICD-10-CM | POA: Diagnosis not present

## 2018-02-06 DIAGNOSIS — E119 Type 2 diabetes mellitus without complications: Secondary | ICD-10-CM | POA: Diagnosis not present

## 2018-02-06 DIAGNOSIS — D649 Anemia, unspecified: Secondary | ICD-10-CM | POA: Diagnosis not present

## 2018-02-06 DIAGNOSIS — E7800 Pure hypercholesterolemia, unspecified: Secondary | ICD-10-CM | POA: Diagnosis not present

## 2018-02-06 DIAGNOSIS — I2510 Atherosclerotic heart disease of native coronary artery without angina pectoris: Secondary | ICD-10-CM | POA: Diagnosis not present

## 2018-02-06 DIAGNOSIS — N390 Urinary tract infection, site not specified: Secondary | ICD-10-CM | POA: Diagnosis not present

## 2018-02-13 ENCOUNTER — Encounter: Payer: Self-pay | Admitting: Internal Medicine

## 2018-02-13 ENCOUNTER — Telehealth: Payer: Self-pay

## 2018-02-13 ENCOUNTER — Telehealth: Payer: Self-pay | Admitting: Internal Medicine

## 2018-02-13 DIAGNOSIS — D649 Anemia, unspecified: Secondary | ICD-10-CM

## 2018-02-13 NOTE — Telephone Encounter (Signed)
Patient notified of the results  She will come for labs at 9:00on 02/21/18.  She can't come any earlier that this date. She had a CBC with her  PCP.

## 2018-02-13 NOTE — Telephone Encounter (Signed)
-----   Message from Gatha Mayer, MD sent at 02/13/2018  7:44 AM EST ----- Regarding: Capsule results Let her know nothing bad  Explain she has AVM's ;eaking blood  We will explain more when she sees Nevin Bloodgood 11/26  Treat with iron as we are for now  Please add a ferritin on to her pending CBC

## 2018-02-13 NOTE — Telephone Encounter (Signed)
Left message for patient to call back  

## 2018-02-14 NOTE — Telephone Encounter (Signed)
I explained to her that the results of a capsule endoscopy take approximately 2 weeks to get back.  All questions answered

## 2018-02-21 ENCOUNTER — Encounter: Payer: Self-pay | Admitting: Nurse Practitioner

## 2018-02-21 ENCOUNTER — Ambulatory Visit: Payer: No Typology Code available for payment source | Admitting: Nurse Practitioner

## 2018-02-21 ENCOUNTER — Ambulatory Visit (INDEPENDENT_AMBULATORY_CARE_PROVIDER_SITE_OTHER): Payer: Medicare Other | Admitting: Nurse Practitioner

## 2018-02-21 ENCOUNTER — Other Ambulatory Visit (INDEPENDENT_AMBULATORY_CARE_PROVIDER_SITE_OTHER): Payer: Medicare Other

## 2018-02-21 VITALS — BP 136/60 | HR 68 | Wt 187.0 lb

## 2018-02-21 DIAGNOSIS — D500 Iron deficiency anemia secondary to blood loss (chronic): Secondary | ICD-10-CM

## 2018-02-21 DIAGNOSIS — D509 Iron deficiency anemia, unspecified: Secondary | ICD-10-CM

## 2018-02-21 DIAGNOSIS — D649 Anemia, unspecified: Secondary | ICD-10-CM

## 2018-02-21 DIAGNOSIS — Q2730 Arteriovenous malformation, site unspecified: Secondary | ICD-10-CM | POA: Diagnosis not present

## 2018-02-21 LAB — CBC WITH DIFFERENTIAL/PLATELET
BASOS PCT: 1.2 % (ref 0.0–3.0)
Basophils Absolute: 0.1 10*3/uL (ref 0.0–0.1)
Eosinophils Absolute: 0.2 10*3/uL (ref 0.0–0.7)
Eosinophils Relative: 3.6 % (ref 0.0–5.0)
HEMATOCRIT: 33.3 % — AB (ref 36.0–46.0)
Hemoglobin: 10.7 g/dL — ABNORMAL LOW (ref 12.0–15.0)
LYMPHS ABS: 1.6 10*3/uL (ref 0.7–4.0)
LYMPHS PCT: 26.7 % (ref 12.0–46.0)
MCHC: 32.1 g/dL (ref 30.0–36.0)
MCV: 84.9 fl (ref 78.0–100.0)
MONOS PCT: 10.7 % (ref 3.0–12.0)
Monocytes Absolute: 0.6 10*3/uL (ref 0.1–1.0)
NEUTROS ABS: 3.4 10*3/uL (ref 1.4–7.7)
Neutrophils Relative %: 57.8 % (ref 43.0–77.0)
PLATELETS: 226 10*3/uL (ref 150.0–400.0)
RBC: 3.92 Mil/uL (ref 3.87–5.11)
RDW: 32.5 % — AB (ref 11.5–15.5)
WBC: 5.8 10*3/uL (ref 4.0–10.5)

## 2018-02-21 LAB — FERRITIN: FERRITIN: 165.8 ng/mL (ref 10.0–291.0)

## 2018-02-21 NOTE — Patient Instructions (Signed)
If you are age 82 or older, your body mass index should be between 23-30. Your Body mass index is 33.13 kg/m. If this is out of the aforementioned range listed, please consider follow up with your Primary Care Provider.  If you are age 68 or younger, your body mass index should be between 19-25. Your Body mass index is 33.13 kg/m. If this is out of the aformentioned range listed, please consider follow up with your Primary Care Provider.   Your provider has requested that you go to the basement level for lab work before leaving today. Press "B" on the elevator. The lab is located at the first door on the left as you exit the elevator. CBC in one month.  We will call you with results.  Thank you for choosing me and [facility].   Tye Savoy, NP

## 2018-02-21 NOTE — Progress Notes (Addendum)
Chief Complaint:    Hospital follow-up.  IMPRESSION and PLAN:    84 year old female recently hospitalized with iron deficiency anemia on blood thinners. Anemia felt to be secondary to small bowel AVMs seen on recent video capsule study.  -Hemoglobin 8.8 at time of hospital discharge on 10/28.  Further improvement on labs at PCPs office 02/03/2018 when hemoglobin was 9.9.  today's labs are pending.  Patient is s/p 2 iron infusions and is on oral iron -AVMs seen on capsule Endo probably not accessible with a regular enteroscope.  She would likely need single or double balloon enteroscopy for ablation.  I do not know that we offer, but will check.  Patient is not in a position to go to a tertiary care center due to lack of transportation.  -Patient says that she will likely be on blood thinners indefinitely -If we are unable to offer the endoscopic procedures locally then our only option is to try and keep up GI blood loss with iron and blood transfusions as needed. -She will come for CBC in 4 weeks.  She is already scheduled for labs the end of January throughher  PCP  Addendum: Reviewed and agree with assessment and management plan. Nevin Bloodgood, I will forward to Dr. Rush Landmark to see if he would be able to offer double balloon enteroscopy for bleeding angioectasias.  In the meantime, and even if not an enteroscopy candidate, closely monitor Hgb and iron stores.  IV iron if needed to maintain normal iron  Pyrtle, Lajuan Lines, MD    HPI:     Patient is an 84 year old female known to Dr. Hilarie Fredrickson.  She was hospitalized in August with new anemia.  EGD at that time by Dr. Lyndel Safe showed a few 4 to 6 mm non-bleeding erosions in the gastric body and in the gastric antrum with moderate surrounding gastritis. There were no stigmata of recent bleeding. Biopsies compatible reactive gastropathy.  A colonoscopy was not repeated as she had one in 2016.  She was scheduled for a small bowel video capsule study which  showed no active bleeding but 3-4 AVMs scattered throughout the small bowel.  The first AVM was seen at 1 hour 16 minutes.  AVMs were felt to be the likely source of her iron deficiency anemia but probably not approachable with regular enteroscopy .   Her B12 was on the low end so  given a B12 injection. On day of discharge 01/23/2018 her hemoglobin was stable at 8.8.  Further improvement on labs at PCPs office 02/03/2018 when hemoglobin was 9.9. Repeat labs done today are pending. Patient stools are dark on iron so she has no way of knowing if there is any gastrointestinal blood loss.  She feels okay without abdominal pain, nausea or vomiting.    Review of systems:     No chest pain, no SOB, no fevers, no urinary sx   Past Medical History:  Diagnosis Date  . Acalculous cholecystitis   . Arthritis   . Blood transfusion   . Blood transfusion without reported diagnosis   . Cataract    bilateral-removed from both eyes  . Coronary artery disease    mi  7/12,  stress test 12/21/2011=>negative for ischemia [name])  . Diabetes mellitus    pt states she is not diabetic  . Diverticulitis   . Dizzy   . Dyslipidemia   . Fatigue   . GERD (gastroesophageal reflux disease)   . Glaucoma   . Hypertension   .  Hypokalemia   . Iron deficiency anemia due to chronic blood loss from small bowel AVM's 01/22/2018  . Mitral regurgitation    mild to moderate, cardiac echo 12/02/2011, EF 51% [name])  . Myocardial infarction ([place])    7/12  . PONV (postoperative nausea and vomiting)    yrs ago  . Shortness of breath     Patient's surgical history, family medical history, social history, medications and allergies were all reviewed in Epic   Creatinine clearance cannot be calculated (Patient's most recent lab result is older than the maximum 21 days allowed.)  Current Outpatient Medications  Medication Sig Dispense Refill  . amiodarone (PACERONE) 200 MG tablet Take 100 mg by mouth daily.    . digoxin  (LANOXIN) 0.125 MG tablet Take 0.125 mg by mouth daily. (0800)    . dorzolamide (TRUSOPT) 2 % ophthalmic solution Place 1 drop into the left eye 2 (two) times daily. (0800 & 1900)    . ergocalciferol (VITAMIN D2) 50000 units capsule Take 50,000 Units by mouth every Monday. (0800)    . ferrous sulfate 325 (65 FE) MG EC tablet Take 1 tablet (325 mg total) by mouth 2 (two) times daily. 60 tablet 3  . hydrocortisone cream 1 % Apply topically 3 (three) times daily as needed for itching. (Patient taking differently: Apply 1 application topically 3 (three) times daily as needed for itching. ) 30 g 0  . latanoprost (XALATAN) 0.005 % ophthalmic solution Place 1 drop into the left eye at bedtime. (1900)  11  . methocarbamol (ROBAXIN) 500 MG tablet Take 500 mg by mouth 4 (four) times daily as needed (pain).     . metoprolol tartrate (LOPRESSOR) 25 MG tablet Take 25 mg by mouth 2 (two) times daily.    . Multiple Vitamins-Minerals (PRESERVISION AREDS 2) CAPS Take 1 capsule by mouth 2 (two) times daily.    . nitroGLYCERIN (NITROSTAT) 0.4 MG SL tablet Place 0.4 mg under the tongue every 5 (five) minutes x 3 doses as needed for chest pain.    . potassium chloride (K-DUR) 10 MEQ tablet Take 1 tablet (10 mEq total) by mouth daily. (Patient taking differently: Take 10 mEq by mouth daily at 6 (six) AM. ) 30 tablet 2  . Propylene Glycol (SYSTANE BALANCE OP) Apply 1 drop to eye 4 (four) times daily as needed (dry eyes).     . Rivaroxaban (XARELTO) 15 MG TABS tablet Take 1 tablet (15 mg total) by mouth daily with supper. 30 tablet 0  . rosuvastatin (CRESTOR) 5 MG tablet Take 5 mg by mouth at bedtime. (2000)    . torsemide (DEMADEX) 20 MG tablet Take 1 tablet (20 mg total) by mouth 2 (two) times daily. (Patient taking differently: Take 20 mg by mouth daily at 6 (six) AM. (0600)) 60 tablet 2  . torsemide (DEMADEX) 20 MG tablet Take 20 mg by mouth daily. And a second dose in the afternoon if needed- if weight up 3 lbs    .  vitamin B-12 (CYANOCOBALAMIN) 500 MCG tablet Take 1 tablet (500 mcg total) by mouth daily. 30 tablet 0   No current facility-administered medications for this visit.     Physical Exam:     BP 136/60   Pulse 68   Wt 187 lb (84.8 kg)   BMI 33.13 kg/m   GENERAL:  Pleasant female in NAD PSYCH: : Cooperative, normal affect EENT:  conjunctiva pink, mucous membranes moist, neck supple without masses CARDIAC:  RRR,  no peripheral  edema PULM: Normal respiratory effort, lungs CTA bilaterally, no wheezing ABDOMEN:  Nondistended, soft, nontender. No obvious masses, no hepatomegaly,  normal bowel sounds SKIN:  turgor, no lesions seen Musculoskeletal:  Normal muscle tone, normal strength NEURO: Alert and oriented x 3, no focal neurologic deficits   Tye Savoy , NP 02/21/2018, 9:58 AM

## 2018-02-28 ENCOUNTER — Ambulatory Visit: Payer: Medicare Other | Admitting: Sports Medicine

## 2018-03-03 ENCOUNTER — Telehealth: Payer: Self-pay

## 2018-03-03 NOTE — Telephone Encounter (Signed)
Mansouraty, Telford Nab., MD  Lavena Bullion, DO; Pyrtle, Lajuan Lines, MD; Willia Craze, NP; Timothy Lasso, RN        Let's shoot for late January or early February.  Amir Fick, can you look for a spot for a Single Balloon?  Will need to be scheduled as a 2-hour time slot.  GM

## 2018-03-03 NOTE — Telephone Encounter (Signed)
Will contact pt late January to set up Feb appt for single ballon 2 hour spot.

## 2018-03-15 ENCOUNTER — Telehealth: Payer: Self-pay | Admitting: Nurse Practitioner

## 2018-03-15 NOTE — Telephone Encounter (Signed)
[name] the pt last saw Nevin Bloodgood can you send to her nurse thanks.

## 2018-03-24 ENCOUNTER — Other Ambulatory Visit (INDEPENDENT_AMBULATORY_CARE_PROVIDER_SITE_OTHER): Payer: Medicare Other

## 2018-03-24 DIAGNOSIS — D509 Iron deficiency anemia, unspecified: Secondary | ICD-10-CM | POA: Diagnosis not present

## 2018-03-24 LAB — CBC
HEMATOCRIT: 35.3 % — AB (ref 36.0–46.0)
HEMOGLOBIN: 11.5 g/dL — AB (ref 12.0–15.0)
MCHC: 32.7 g/dL (ref 30.0–36.0)
MCV: 90 fl (ref 78.0–100.0)
Platelets: 211 10*3/uL (ref 150.0–400.0)
RBC: 3.92 Mil/uL (ref 3.87–5.11)
RDW: 25.1 % — ABNORMAL HIGH (ref 11.5–15.5)
WBC: 5.6 10*3/uL (ref 4.0–10.5)

## 2018-04-10 DIAGNOSIS — L603 Nail dystrophy: Secondary | ICD-10-CM | POA: Diagnosis not present

## 2018-04-10 DIAGNOSIS — I739 Peripheral vascular disease, unspecified: Secondary | ICD-10-CM | POA: Diagnosis not present

## 2018-04-10 DIAGNOSIS — B351 Tinea unguium: Secondary | ICD-10-CM | POA: Diagnosis not present

## 2018-04-10 DIAGNOSIS — Q845 Enlarged and hypertrophic nails: Secondary | ICD-10-CM | POA: Diagnosis not present

## 2018-04-17 DIAGNOSIS — I10 Essential (primary) hypertension: Secondary | ICD-10-CM | POA: Diagnosis not present

## 2018-04-27 DIAGNOSIS — D649 Anemia, unspecified: Secondary | ICD-10-CM | POA: Diagnosis not present

## 2018-04-27 DIAGNOSIS — I10 Essential (primary) hypertension: Secondary | ICD-10-CM | POA: Diagnosis not present

## 2018-04-28 ENCOUNTER — Other Ambulatory Visit: Payer: Self-pay

## 2018-04-28 ENCOUNTER — Telehealth: Payer: Self-pay

## 2018-04-28 DIAGNOSIS — D509 Iron deficiency anemia, unspecified: Secondary | ICD-10-CM

## 2018-04-28 DIAGNOSIS — Q2730 Arteriovenous malformation, site unspecified: Secondary | ICD-10-CM

## 2018-04-28 NOTE — Telephone Encounter (Signed)
-----   Message from Timothy Lasso, RN sent at 04/19/2018  8:57 AM EST -----  ----- Message ----- From: Timothy Lasso, RN Sent: 04/19/2018 To: Timothy Lasso, RN   ----- Message ----- From: Timothy Lasso, RN Sent: 04/17/2018 To: Timothy Lasso, RN   ----- Message ----- From: Timothy Lasso, RN Sent: 04/07/2018 To: Timothy Lasso, RN  Will contact pt late January to set up Feb appt for single ballon 2 hour spot.

## 2018-04-28 NOTE — Telephone Encounter (Signed)
3/4 930 am [facility] Dr Rush Landmark

## 2018-04-28 NOTE — Telephone Encounter (Signed)
Need to send xarelto letter  Instructions printed and mailed.

## 2018-05-01 ENCOUNTER — Encounter: Payer: Self-pay | Admitting: Cardiology

## 2018-05-01 ENCOUNTER — Ambulatory Visit (INDEPENDENT_AMBULATORY_CARE_PROVIDER_SITE_OTHER): Payer: Medicare Other | Admitting: Cardiology

## 2018-05-01 VITALS — BP 105/64 | HR 86 | Ht 63.0 in | Wt 183.8 lb

## 2018-05-01 DIAGNOSIS — I4821 Permanent atrial fibrillation: Secondary | ICD-10-CM

## 2018-05-01 DIAGNOSIS — I10 Essential (primary) hypertension: Secondary | ICD-10-CM

## 2018-05-01 DIAGNOSIS — Z8719 Personal history of other diseases of the digestive system: Secondary | ICD-10-CM

## 2018-05-01 DIAGNOSIS — I5032 Chronic diastolic (congestive) heart failure: Secondary | ICD-10-CM | POA: Diagnosis not present

## 2018-05-01 DIAGNOSIS — I2510 Atherosclerotic heart disease of native coronary artery without angina pectoris: Secondary | ICD-10-CM | POA: Diagnosis not present

## 2018-05-01 DIAGNOSIS — Z7901 Long term (current) use of anticoagulants: Secondary | ICD-10-CM | POA: Diagnosis not present

## 2018-05-01 MED ORDER — POTASSIUM CHLORIDE ER 10 MEQ PO TBCR: 10.0000 meq | EXTENDED_RELEASE_TABLET | Freq: Every day | ORAL | Status: DC

## 2018-05-01 MED ORDER — TORSEMIDE 20 MG PO TABS: 20.0000 mg | ORAL_TABLET | ORAL | Status: DC

## 2018-05-01 NOTE — Progress Notes (Signed)
Subjective:   [name]  ID: [name], female    DOB: 1934/01/01, 85 y.o.   MRN: [id number]  Chief Complaint: F/U atrial fibrillation  HPI: [name] is a Caucasian female with known coronary artery disease and LAD stenting in 2012, permanent atrial fibrillation  on Amio due to difficulty in rate control, failed cardioversion on 11/02/17 when she was admitted with symptomatic anemia from erosive gastritis, needed blood transfusion.  Again admitted on 01/21/18 with severe anemia with a hemoglobin of 5.0.  Again no obvious GI etiology was found, felt to be due to severe iron deficiency anemia.  Past medical history significant for hyperlipidemia and hypertensoin, severe degenerative disc disease, chronic back pain, arthritis and sedentary lifestyle due to this.  She is presently living in a facility but plans to return home soon to be living with her son.  She is maintained weight loss.  States that she is feeling the best she has in quite a while, brings me her labs that was performed recently by her PCP.  No palpitations, no fatigue.  Past Medical History:  Diagnosis Date  . Acalculous cholecystitis   . Arthritis   . Blood transfusion   . Blood transfusion without reported diagnosis   . Cataract    bilateral-removed from both eyes  . Coronary artery disease    mi  7/12,  stress test 12/21/2011=>negative for ischemia [name])  . Diabetes mellitus    pt states she is not diabetic  . Diverticulitis   . Dizzy   . Dyslipidemia   . Fatigue   . GERD (gastroesophageal reflux disease)   . Glaucoma   . Hypertension   . Hypokalemia   . Iron deficiency anemia due to chronic blood loss from small bowel AVM's 01/22/2018  . Mitral regurgitation    mild to moderate, cardiac echo 12/02/2011, EF 51% [name])  . Myocardial infarction ([place])    7/12  . PONV (postoperative nausea and vomiting)    yrs ago  . Shortness of breath     Past Surgical History:  Procedure  Laterality Date  . ABDOMINAL HYSTERECTOMY    . BILIARY DRAINAGE  9/12  . BIOPSY  11/04/2017   Procedure: BIOPSY;  Surgeon: Jackquline Denmark, MD;  Location: [facility] ENDOSCOPY;  Service: Endoscopy;;  . CARDIOVERSION N/A 02/12/2015   Procedure: CARDIOVERSION;  Surgeon: [name], MD;  Location: [place];  Service: Cardiovascular;  Laterality: N/A;  . CARDIOVERSION N/A 11/10/2017   Procedure: CARDIOVERSION;  Surgeon: Nigel Mormon, MD;  Location: [place] ENDOSCOPY;  Service: Cardiovascular;  Laterality: N/A;  . CATARACT EXTRACTION     2  . CHOLECYSTECTOMY  02/25/2011   Procedure: LAPAROSCOPIC CHOLECYSTECTOMY WITH INTRAOPERATIVE CHOLANGIOGRAM;  Surgeon: Gayland Curry, MD;  Location: [place];  Service: General;  Laterality: N/A;  . CORONARY ANGIOPLASTY WITH STENT PLACEMENT  09/24/10   drug eluting stents  . ESOPHAGOGASTRODUODENOSCOPY (EGD) WITH PROPOFOL N/A 11/04/2017   Procedure: ESOPHAGOGASTRODUODENOSCOPY (EGD) WITH PROPOFOL;  Surgeon: Jackquline Denmark, MD;  Location: [facility] ENDOSCOPY;  Service: Endoscopy;  Laterality: N/A;  . GIVENS CAPSULE STUDY N/A 01/22/2018   Procedure: GIVENS CAPSULE STUDY;  Surgeon: Gatha Mayer, MD;  Location: [place];  Service: Endoscopy;  Laterality: N/A;  . PARATHYROIDECTOMY     tumor excision  . SHOULDER SURGERY     right  . SIGMOIDOSCOPY     flex sig in the 80's per pt.    Social History   Socioeconomic History  . Marital status: Widowed  Spouse name: Not on file  . Number of children: 2  . Years of education: Not on file  . Highest education level: Not on file  Occupational History  . Occupation: RETIRED OFFICE WORKER  Social Needs  . Financial resource strain: Not on file  . Food insecurity:    Worry: Not on file    Inability: Not on file  . Transportation needs:    Medical: Not on file    Non-medical: Not on file  Tobacco Use  . Smoking status: Never Smoker  . Smokeless tobacco: Never Used  Substance and Sexual Activity  . Alcohol use: Not  Currently    Alcohol/week: 0.0 standard drinks    Comment: socially-few drinks on the weekends  . Drug use: No  . Sexual activity: Yes    Birth control/protection: Post-menopausal  Lifestyle  . Physical activity:    Days per week: Not on file    Minutes per session: Not on file  . Stress: Not on file  Relationships  . Social connections:    Talks on phone: Not on file    Gets together: Not on file    Attends religious service: Not on file    Active member of club or organization: Not on file    Attends meetings of clubs or organizations: Not on file    Relationship status: Not on file  . Intimate partner violence:    Fear of current or ex partner: Not on file    Emotionally abused: Not on file    Physically abused: Not on file    Forced sexual activity: Not on file  Other Topics Concern  . Not on file  Social History Narrative  . Not on file    Current Outpatient Medications on File Prior to Visit  Medication Sig Dispense Refill  . amiodarone (PACERONE) 200 MG tablet Take 100 mg by mouth daily. 1/2 tab    . dorzolamide (TRUSOPT) 2 % ophthalmic solution Place 1 drop into the left eye 2 (two) times daily. (0800 & 1900)    . ergocalciferol (VITAMIN D2) 50000 units capsule Take 50,000 Units by mouth every Monday. (0800)    . ferrous sulfate 325 (65 FE) MG EC tablet Take 1 tablet (325 mg total) by mouth 2 (two) times daily. 60 tablet 3  . hydrocortisone cream 1 % Apply topically 3 (three) times daily as needed for itching. (Patient taking differently: Apply 1 application topically 3 (three) times daily as needed for itching. ) 30 g 0  . latanoprost (XALATAN) 0.005 % ophthalmic solution Place 1 drop into the left eye at bedtime. (1900)  11  . methocarbamol (ROBAXIN) 500 MG tablet Take 500 mg by mouth 4 (four) times daily as needed (pain).     . metoprolol tartrate (LOPRESSOR) 25 MG tablet Take 25 mg by mouth 2 (two) times daily.    . Multiple Vitamins-Minerals (PRESERVISION AREDS 2)  CAPS Take 1 capsule by mouth 2 (two) times daily.    . nitroGLYCERIN (NITROSTAT) 0.4 MG SL tablet Place 0.4 mg under the tongue every 5 (five) minutes x 3 doses as needed for chest pain.    . potassium chloride (K-DUR) 10 MEQ tablet Take 1 tablet (10 mEq total) by mouth daily. (Patient taking differently: Take 10 mEq by mouth daily at 6 (six) AM. ) 30 tablet 2  . Propylene Glycol (SYSTANE BALANCE OP) Apply 1 drop to eye 4 (four) times daily as needed (dry eyes).     . Rivaroxaban (  XARELTO) 15 MG TABS tablet Take 1 tablet (15 mg total) by mouth daily with supper. 30 tablet 0  . rosuvastatin (CRESTOR) 5 MG tablet Take 5 mg by mouth at bedtime. (2000)    . torsemide (DEMADEX) 20 MG tablet Take 1 tablet (20 mg total) by mouth 2 (two) times daily. (Patient taking differently: Take 20 mg by mouth daily at 6 (six) AM. (0600)) 60 tablet 2  . vitamin B-12 (CYANOCOBALAMIN) 500 MCG tablet Take 1 tablet (500 mcg total) by mouth daily. 30 tablet 0  . digoxin (LANOXIN) 0.125 MG tablet Take 0.125 mg by mouth daily. (0800)     No current facility-administered medications on file prior to visit.    Review of Systems  Constitutional: Positive for malaise/fatigue. Negative for weight loss.  Respiratory: Positive for shortness of breath (chronic and stable). Negative for cough and hemoptysis.   Cardiovascular: Positive for leg swelling (chronic). Negative for chest pain, palpitations and claudication.  Gastrointestinal: Negative for abdominal pain, blood in stool, constipation, heartburn and vomiting.  Genitourinary: Negative for dysuria.  Musculoskeletal: Negative for joint pain and myalgias.  Neurological: Negative for dizziness, focal weakness and headaches.  Endo/Heme/Allergies: Bruises/bleeds easily.  Psychiatric/Behavioral: Negative for depression. The patient is not nervous/anxious.   All other systems reviewed and are negative.      Objective:  Blood pressure 105/64, pulse 86, height '5\' 3"'  (1.6 m),  weight 183 lb 12.8 oz (83.4 kg), SpO2 96 %.  Physical Exam  Constitutional: She appears well-developed and well-nourished. No distress.  HENT:  Head: Atraumatic.  Eyes: Conjunctivae are normal.  Neck: Neck supple. No JVD present. No thyromegaly present.  Cardiovascular: Normal rate and intact distal pulses. An irregularly irregular rhythm present. Exam reveals no gallop.  No murmur heard. Pulses:      Carotid pulses are 3+ on the right side and 3+ on the left side. S1 is variable, S2 is normal.  No gallop or murmur appreciated.  Pulmonary/Chest: Effort normal and breath sounds normal.  Abdominal: Soft. Bowel sounds are normal.  Obese abdomen.  Musculoskeletal: Normal range of motion.        General: No edema.  Neurological: She is alert.  Skin: Skin is warm and dry.  Psychiatric: She has a normal mood and affect.   TEE 01/27/2018:  Normal LV systolic function, EF 63-89% without wall motion abnormality.  Mild mitral annular calcification and mild MR.  Mild TR and mild pulmonary hypertension.  Heart catheterization 09/24/10: PCI to proximal and mid LAD with 4x18 and 3.5x9 Resolute DES, D1 3.0x12 Resolute stent, Kissing balloon angioplasty. Normal circumflex and RCA. Normal LVEF.  EKG 01/05/2018: Atrial fibrillation controlled went response at the rate of 68 bpm, normal axis, right bundle branch block.  Nonspecific ST-T abnormality.  Cannot exclude inferior and lateral ischemia.  Low-voltage complexes.  Normal QT interval. No significant change from EKG 12/22/2017: Atrial fibrillation with rapid ventricular response at rate of 110 bpm  Labs 04/17/18/20: PCP HB 12.4/HCT 38.4, platelets 219.  Normal indicis.  Total cholesterol 132, triglycerides 92, HDL 51, LDL 63.  Labs 11/01/2017: Serum glucose 16 mg, BUN 20, creatinine 0.94, eGFR 56 mL.  Assessment & Recommendations:   1.  Permanent atrial fibrillation CHA2DS2-VASc Score is 5.  -(CHF; HTN; vasc disease DM,  Female = 1; Age <65 =0;  65-74 = 1,  >75 =2; stroke = 2).  -(Yearly risk of stroke: Score of 1=1.3; 2=2.2; 3=3.2; 4=4; 5=6.7; 6=9.8; 7=>9.8)  2. CAD of the native vessel without  angina pectoris Heart catheterization 09/24/10: PCI to proximal and mid LAD with 4x18 and 3.5x9 Resolute DES, D1 3.0x12 Resolute stent, Kissing balloon angioplasty. Normal circumflex and RCA. Normal LVEF. 3. Chronic diastolic heart failure 4.  Chronic anemia, now scheduled for enteroscopy, needs anti-correlation held.  Recommendation: Patient presently on low-dose amiodarone along with digoxin for rate control, has had difficulty with rate control.  For now she is doing the best and has not had any recurrence of heart failure, anemia has improved.  Advised her to continue present medications, I'll consider discontinuing digoxin on her next office visit. For now I will reduce the dose of amiodarone from 200 mg to 100 mg daily.  Blood pressure is well controlled, she has not had recurrence of angina pectoris since angioplasty, otherwise remains hemodynamically stable from cardiac standpoint.  She can be taken up for the upcoming enteroscopy with low risk and anti-correlation can be held for 2 days prior to the procedure and restart same day flow biopsy otherwise 3-5 days post procedure. She is presently taking torsemide every morning along with potassium supplements, continue the same.   [name], MD, [facility] 05/01/2018, 2:35 PM [facility] [place] Pager: [phone] Office: [phone] If no answer Cell [phone]

## 2018-05-01 NOTE — Telephone Encounter (Signed)
I spoke with the pt and she is not sure she wants to have the procedure.  She will speak with Dr Einar Gip and her family and call back if she decides to cancel.

## 2018-05-02 ENCOUNTER — Telehealth: Payer: Self-pay

## 2018-05-02 DIAGNOSIS — H401121 Primary open-angle glaucoma, left eye, mild stage: Secondary | ICD-10-CM | POA: Diagnosis not present

## 2018-05-02 NOTE — Telephone Encounter (Signed)
The patient has been notified of this information and all questions answered.

## 2018-05-02 NOTE — Telephone Encounter (Signed)
-----   Message from [name], MD sent at 05/01/2018 10:19 PM EST ----- Regarding: Anticoagulation: See my notes on EPIC She can be taken up for the upcoming enteroscopy with low risk and anti-correlation can be held for 2 days prior to the procedure and restart same day flow biopsy otherwise 3-5 days post procedure. ----- Message ----- From: Timothy Lasso, RN Sent: 05/01/2018   9:42 AM EST To: [name], MD

## 2018-05-04 ENCOUNTER — Telehealth: Payer: Self-pay

## 2018-05-04 ENCOUNTER — Other Ambulatory Visit: Payer: Self-pay

## 2018-05-04 NOTE — Telephone Encounter (Signed)
[name], thank you for reaching out. She should continue all of her medications as she would normally take during the day at the scheduled time that she would normally take. The only medication will be her anticoagulant which I believe she is already been given permission based on reading the chart to come off of it for at least 2 days prior to her procedure. Please ensure that this patient's procedure is scheduled for 2 hours block. Thank you.

## 2018-05-04 NOTE — Telephone Encounter (Signed)
Patient is advised. She is anticipating her upcoming procedure in March.  Her medications are given to her at her residence. She does not self-administer. She needs a written statement of the medications she is to take on the morning of the procedure. She can "refuse" the medications, but she will be charged for them. She would prefer the letter so her medication tech will give her the correct meds and not waste any of them. What medications do you want her to take prior to the procedure? Pacerone, digoxin and Lopressor?

## 2018-05-04 NOTE — Telephone Encounter (Signed)
-----   Message from Willia Craze, NP sent at 05/04/2018 11:35 AM EST ----- [name], please let patient know I received fax labs drawn 04/17/2018.  Her hemoglobin is doing well at 12.4.Marland Kitchen Thanks

## 2018-05-05 NOTE — Telephone Encounter (Signed)
Confirmed time allotted for procedure on 05/31/2018. Letter to the patient with statement of the medications to take on the day of the procedure. She already has the statement to hold anti-coagulation.

## 2018-05-08 NOTE — Telephone Encounter (Signed)
Hold for 48 hours and restart same day if no biopsy otherwise 3-5 days post procedure. JG

## 2018-05-11 ENCOUNTER — Telehealth: Payer: Self-pay | Admitting: Nurse Practitioner

## 2018-05-11 ENCOUNTER — Ambulatory Visit: Payer: Self-pay | Admitting: Cardiology

## 2018-05-12 ENCOUNTER — Other Ambulatory Visit: Payer: Self-pay

## 2018-05-12 ENCOUNTER — Telehealth: Payer: Self-pay | Admitting: Nurse Practitioner

## 2018-05-12 NOTE — Telephone Encounter (Signed)
Pt would like to speak with you regarding a letter that you sent her.

## 2018-05-12 NOTE — Telephone Encounter (Signed)
Patient states she is no longer taking Digoxin

## 2018-05-15 NOTE — Telephone Encounter (Signed)
Ok to refill but please ask her to come in for repeat CBC this week. I think she is for an upcoming procedure with Dr. Rush Landmark. Thanks

## 2018-05-16 ENCOUNTER — Other Ambulatory Visit: Payer: Self-pay

## 2018-05-16 DIAGNOSIS — D509 Iron deficiency anemia, unspecified: Secondary | ICD-10-CM

## 2018-05-16 MED ORDER — FERROUS SULFATE 325 (65 FE) MG PO TBEC: 325.0000 mg | DELAYED_RELEASE_TABLET | Freq: Two times a day (BID) | ORAL | 3 refills | Status: AC

## 2018-05-16 NOTE — Telephone Encounter (Signed)
That is fine -Dr. Hilarie Fredrickson had asked that CBC be monitored closely until she has procedure done with Dr. Rush Landmark. Her hgb had significantly improved on iron when checked in December so we can forgo repeat

## 2018-05-16 NOTE — Telephone Encounter (Signed)
Then going forward her iron refill request should go to whomever is monitoring her blood counts.

## 2018-05-22 ENCOUNTER — Telehealth: Payer: Self-pay

## 2018-05-22 NOTE — Telephone Encounter (Signed)
Thank you for update Patty. I'll place Dr. Hilarie Fredrickson and Nevin Bloodgood on this so they know of the patient's inability to have procedure based on moving as well. She should remain on Iron as she was doing. Thanks. GM

## 2018-05-22 NOTE — Telephone Encounter (Signed)
The pt was made aware to resume medications as previously ordered.  Letter was faxed to her for her assisted living facility.

## 2018-05-22 NOTE — Telephone Encounter (Signed)
I called to move the 3/4 enteroscopy at [facility] to 3/16 however she states she is unable to have the procedure at all in March and possibly not even in April.  She is in the process of moving.  I advised her to call our office when she is ready to reschedule.

## 2018-05-29 ENCOUNTER — Other Ambulatory Visit (INDEPENDENT_AMBULATORY_CARE_PROVIDER_SITE_OTHER): Payer: Medicare Other

## 2018-05-29 DIAGNOSIS — D509 Iron deficiency anemia, unspecified: Secondary | ICD-10-CM | POA: Diagnosis not present

## 2018-05-29 LAB — CBC
HCT: 38.7 % (ref 36.0–46.0)
Hemoglobin: 12.9 g/dL (ref 12.0–15.0)
MCHC: 33.5 g/dL (ref 30.0–36.0)
MCV: 94.2 fl (ref 78.0–100.0)
Platelets: 218 10*3/uL (ref 150.0–400.0)
RBC: 4.1 Mil/uL (ref 3.87–5.11)
RDW: 14.1 % (ref 11.5–15.5)
WBC: 5.4 10*3/uL (ref 4.0–10.5)

## 2018-05-31 ENCOUNTER — Ambulatory Visit (HOSPITAL_COMMUNITY): Admit: 2018-05-31 | Payer: No Typology Code available for payment source | Admitting: Gastroenterology

## 2018-05-31 ENCOUNTER — Encounter (HOSPITAL_COMMUNITY): Payer: Self-pay

## 2018-05-31 SURGERY — ENTEROSCOPY
Anesthesia: Monitor Anesthesia Care

## 2018-06-09 ENCOUNTER — Other Ambulatory Visit: Payer: Self-pay | Admitting: Cardiology

## 2018-06-16 ENCOUNTER — Telehealth: Payer: Self-pay | Admitting: Nurse Practitioner

## 2018-06-16 NOTE — Telephone Encounter (Signed)
Left message for the patient on her voicemail because she did not answer. Last CBC in the chart is from 2 weeks ago. It shows a normal hemoglobin at 12.9 which is a normal range.

## 2018-06-16 NOTE — Telephone Encounter (Signed)
Pt called regarding lab results, she wants to know if she needs a blood transfusion.

## 2018-07-19 ENCOUNTER — Other Ambulatory Visit: Payer: Self-pay

## 2018-07-20 ENCOUNTER — Other Ambulatory Visit: Payer: Self-pay | Admitting: Cardiology

## 2018-07-20 NOTE — Telephone Encounter (Signed)
Pt wants a refill of netio and xarelo is this ok to send?

## 2018-07-21 ENCOUNTER — Other Ambulatory Visit: Payer: Self-pay

## 2018-07-21 DIAGNOSIS — I5032 Chronic diastolic (congestive) heart failure: Secondary | ICD-10-CM

## 2018-07-21 NOTE — Telephone Encounter (Signed)
Are these ok to send?

## 2018-07-21 NOTE — Telephone Encounter (Signed)
What is the message?

## 2018-07-24 ENCOUNTER — Telehealth: Payer: Self-pay

## 2018-07-24 ENCOUNTER — Telehealth: Payer: Self-pay | Admitting: Cardiology

## 2018-07-24 DIAGNOSIS — I5032 Chronic diastolic (congestive) heart failure: Secondary | ICD-10-CM

## 2018-07-24 MED ORDER — AMIODARONE HCL 200 MG PO TABS: 100.0000 mg | ORAL_TABLET | Freq: Every day | ORAL | 1 refills | Status: DC

## 2018-07-24 MED ORDER — TORSEMIDE 20 MG PO TABS: 20.0000 mg | ORAL_TABLET | Freq: Two times a day (BID) | ORAL | 1 refills | Status: DC

## 2018-07-24 MED ORDER — POTASSIUM CHLORIDE ER 10 MEQ PO TBCR: 10.0000 meq | EXTENDED_RELEASE_TABLET | Freq: Two times a day (BID) | ORAL | Status: DC

## 2018-07-24 MED ORDER — RIVAROXABAN 15 MG PO TABS: ORAL_TABLET | ORAL | 0 refills | Status: DC

## 2018-07-24 NOTE — Telephone Encounter (Signed)
im sorry but the medications go to [facility]

## 2018-07-24 NOTE — Telephone Encounter (Signed)
Medications intially sent to [facility]; therefore, medications were resent to [facility].

## 2018-07-25 ENCOUNTER — Other Ambulatory Visit: Payer: Self-pay | Admitting: Cardiology

## 2018-07-25 DIAGNOSIS — I5032 Chronic diastolic (congestive) heart failure: Secondary | ICD-10-CM

## 2018-07-25 MED ORDER — RIVAROXABAN 15 MG PO TABS: ORAL_TABLET | ORAL | 3 refills | Status: DC

## 2018-07-25 MED ORDER — POTASSIUM CHLORIDE ER 10 MEQ PO TBCR: 10.0000 meq | EXTENDED_RELEASE_TABLET | Freq: Every day | ORAL | 3 refills | Status: DC

## 2018-07-25 MED ORDER — NITROGLYCERIN 0.4 MG SL SUBL: 0.4000 mg | SUBLINGUAL_TABLET | SUBLINGUAL | 2 refills | Status: DC | PRN

## 2018-09-05 DIAGNOSIS — L239 Allergic contact dermatitis, unspecified cause: Secondary | ICD-10-CM | POA: Diagnosis not present

## 2018-10-12 DIAGNOSIS — I10 Essential (primary) hypertension: Secondary | ICD-10-CM | POA: Diagnosis not present

## 2018-10-12 DIAGNOSIS — R739 Hyperglycemia, unspecified: Secondary | ICD-10-CM | POA: Diagnosis not present

## 2018-10-12 DIAGNOSIS — E7800 Pure hypercholesterolemia, unspecified: Secondary | ICD-10-CM | POA: Diagnosis not present

## 2018-10-12 DIAGNOSIS — E538 Deficiency of other specified B group vitamins: Secondary | ICD-10-CM | POA: Diagnosis not present

## 2018-10-18 DIAGNOSIS — R7303 Prediabetes: Secondary | ICD-10-CM | POA: Diagnosis not present

## 2018-10-18 DIAGNOSIS — I4891 Unspecified atrial fibrillation: Secondary | ICD-10-CM | POA: Diagnosis not present

## 2018-10-18 DIAGNOSIS — E559 Vitamin D deficiency, unspecified: Secondary | ICD-10-CM | POA: Diagnosis not present

## 2018-10-18 DIAGNOSIS — Z0000 Encounter for general adult medical examination without abnormal findings: Secondary | ICD-10-CM | POA: Diagnosis not present

## 2018-10-18 DIAGNOSIS — E7800 Pure hypercholesterolemia, unspecified: Secondary | ICD-10-CM | POA: Diagnosis not present

## 2018-10-27 ENCOUNTER — Other Ambulatory Visit: Payer: Self-pay | Admitting: Cardiology

## 2018-10-30 ENCOUNTER — Other Ambulatory Visit: Payer: Self-pay

## 2018-10-30 ENCOUNTER — Other Ambulatory Visit: Payer: Self-pay | Admitting: Cardiology

## 2018-10-30 DIAGNOSIS — I10 Essential (primary) hypertension: Secondary | ICD-10-CM

## 2018-10-30 MED ORDER — METOPROLOL TARTRATE 25 MG PO TABS: 25.0000 mg | ORAL_TABLET | Freq: Two times a day (BID) | ORAL | 0 refills | Status: DC

## 2018-11-01 ENCOUNTER — Encounter: Payer: Self-pay | Admitting: Cardiology

## 2018-11-01 ENCOUNTER — Ambulatory Visit (INDEPENDENT_AMBULATORY_CARE_PROVIDER_SITE_OTHER): Payer: Medicare Other | Admitting: Cardiology

## 2018-11-01 ENCOUNTER — Other Ambulatory Visit: Payer: Self-pay

## 2018-11-01 VITALS — BP 121/71 | HR 69 | Ht 63.0 in | Wt 186.0 lb

## 2018-11-01 DIAGNOSIS — I10 Essential (primary) hypertension: Secondary | ICD-10-CM

## 2018-11-01 DIAGNOSIS — I2510 Atherosclerotic heart disease of native coronary artery without angina pectoris: Secondary | ICD-10-CM

## 2018-11-01 DIAGNOSIS — I4821 Permanent atrial fibrillation: Secondary | ICD-10-CM

## 2018-11-01 DIAGNOSIS — I5032 Chronic diastolic (congestive) heart failure: Secondary | ICD-10-CM | POA: Diagnosis not present

## 2018-11-01 NOTE — Progress Notes (Signed)
 Primary Physician/Referring:  Kim, James, MD  Patient ID: [name], female    DOB: 02/05/1934, 84 y.o.   MRN: [id number]  Chief Complaint  Patient presents with  . Atrial Fibrillation  . Coronary Artery Disease  . Follow-up   HPI:    HPI: [name]  is a 84 y.o. Caucasian female with known coronary artery disease and LAD stenting in 2012, permanent atrial fibrillation  on Amio due to difficulty in rate control, failed cardioversion on 11/02/17 when she was admitted with symptomatic anemia from erosive gastritis, needed blood transfusion.  Again admitted on 01/21/18 with severe anemia with a hemoglobin of 5.0.  Again no obvious GI etiology was found, felt to be due to severe iron deficiency anemia.  Past medical history significant for hyperlipidemia and hypertensoin, severe degenerative disc disease, chronic back pain, arthritis and sedentary lifestyle due to this.  She is now motivated with her son, has been extremely watchful with her diet.  She was supposed angiography workup but it was never done and also since COVID 19 since the test was postponed, she has had any recurrence of GI bleed and hemoglobin has been stable.  Except for chronic arthritis, she remains asymptomatic.  Past Medical History:  Diagnosis Date  . Acalculous cholecystitis   . Arthritis   . Blood transfusion   . Blood transfusion without reported diagnosis   . Cataract    bilateral-removed from both eyes  . Coronary artery disease    mi  7/12,  stress test 12/21/2011=>negative for ischemia ( )  . Diabetes mellitus    pt states she is not diabetic  . Diverticulitis   . Dizzy   . Dyslipidemia   . Fatigue   . GERD (gastroesophageal reflux disease)   . Glaucoma   . Hypertension   . Hypokalemia   . Iron deficiency anemia due to chronic blood loss from small bowel AVM's 01/22/2018  . Mitral regurgitation    mild to moderate, cardiac echo 12/02/2011, EF 51% ( )  . Myocardial  infarction (HCC)    7/12  . PONV (postoperative nausea and vomiting)    yrs ago  . Shortness of breath    Past Surgical History:  Procedure Laterality Date  . ABDOMINAL HYSTERECTOMY    . BILIARY DRAINAGE  9/12  . BIOPSY  11/04/2017   Procedure: BIOPSY;  Surgeon: Gupta, Rajesh, MD;  Location: [facility];  Service: Endoscopy;;  . CARDIOVERSION N/A 02/12/2015   Procedure: CARDIOVERSION;  Surgeon:  , MD;  Location: [facility];  Service: Cardiovascular;  Laterality: N/A;  . CARDIOVERSION N/A 11/10/2017   Procedure: CARDIOVERSION;  Surgeon: Patwardhan, Manish J, MD;  Location: [facility];  Service: Cardiovascular;  Laterality: N/A;  . CATARACT EXTRACTION     2  . CHOLECYSTECTOMY  02/25/2011   Procedure: LAPAROSCOPIC CHOLECYSTECTOMY WITH INTRAOPERATIVE CHOLANGIOGRAM;  Surgeon: Eric M Wilson, MD;  Location: [facility] OR;  Service: General;  Laterality: N/A;  . CORONARY ANGIOPLASTY WITH STENT PLACEMENT  09/24/10   drug eluting stents  . ESOPHAGOGASTRODUODENOSCOPY (EGD) WITH PROPOFOL N/A 11/04/2017   Procedure: ESOPHAGOGASTRODUODENOSCOPY (EGD) WITH PROPOFOL;  Surgeon: Gupta, Rajesh, MD;  Location: [facility];  Service: Endoscopy;  Laterality: N/A;  . GIVENS CAPSULE STUDY N/A 01/22/2018   Procedure: GIVENS CAPSULE STUDY;  Surgeon: Gessner, Carl E, MD;  Location: [facility];  Service: Endoscopy;  Laterality: N/A;  . PARATHYROIDECTOMY     tumor excision  . SHOULDER SURGERY     right  . SIGMOIDOSCOPY       flex sig in the 80's per pt.   Social History   Socioeconomic History  . Marital status: Widowed    Spouse name: Not on file  . Number of children: 2  . Years of education: Not on file  . Highest education level: Not on file  Occupational History  . Occupation: RETIRED OFFICE WORKER  Social Needs  . Financial resource strain: Not on file  . Food insecurity    Worry: Not on file    Inability: Not on file  . Transportation needs    Medical: Not on file    Non-medical: Not on  file  Tobacco Use  . Smoking status: Never Smoker  . Smokeless tobacco: Never Used  Substance and Sexual Activity  . Alcohol use: Yes    Alcohol/week: 0.0 standard drinks    Comment: occ  . Drug use: No  . Sexual activity: Yes    Birth control/protection: Post-menopausal  Lifestyle  . Physical activity    Days per week: Not on file    Minutes per session: Not on file  . Stress: Not on file  Relationships  . Social connections    Talks on phone: Not on file    Gets together: Not on file    Attends religious service: Not on file    Active member of club or organization: Not on file    Attends meetings of clubs or organizations: Not on file    Relationship status: Not on file  . Intimate partner violence    Fear of current or ex partner: Not on file    Emotionally abused: Not on file    Physically abused: Not on file    Forced sexual activity: Not on file  Other Topics Concern  . Not on file  Social History Narrative  . Not on file   ROS  Review of Systems  Constitution: Negative for chills, decreased appetite, malaise/fatigue and weight gain.  Cardiovascular: Negative for dyspnea on exertion, leg swelling and syncope.  Endocrine: Negative for cold intolerance.  Hematologic/Lymphatic: Does not bruise/bleed easily.  Musculoskeletal: Positive for back pain and joint pain. Negative for falls and joint swelling.  Gastrointestinal: Negative for abdominal pain, anorexia, change in bowel habit, diarrhea, hematochezia and melena.  Neurological: Negative for headaches and light-headedness.  Psychiatric/Behavioral: Negative for depression and substance abuse.  All other systems reviewed and are negative.  Objective  Blood pressure 121/71, pulse 69, height 5' 3" (1.6 m), weight 186 lb (84.4 kg), SpO2 95 %. Body mass index is 32.95 kg/m.   Physical Exam  Constitutional: She appears well-developed and well-nourished. No distress.  HENT:  Head: Atraumatic.  Eyes: Conjunctivae are  normal.  Neck: Neck supple. No JVD present. No thyromegaly present.  Cardiovascular: Normal rate and intact distal pulses. An irregularly irregular rhythm present. Exam reveals no gallop.  No murmur heard. Pulses:      Carotid pulses are 2+ on the right side and 2+ on the left side.      Femoral pulses are 2+ on the right side and 2+ on the left side.      Popliteal pulses are 0 on the right side and 0 on the left side.       Dorsalis pedis pulses are 1+ on the right side and 1+ on the left side.  S1 is variable, S2 is normal.  No gallop or murmur appreciated. No edema.   Pulmonary/Chest: Effort normal and breath sounds normal.  Abdominal: Soft. Bowel sounds are normal.    Obese abdomen.  Musculoskeletal: Normal range of motion.        General: No edema.  Neurological: She is alert.  Skin: Skin is warm and dry.  Psychiatric: She has a normal mood and affect.   Radiology: No results found.  Laboratory examination:   Labs 04/17/18/20: PCP HB 12.4/HCT 38.4, platelets 219.  Normal indicis.  Total cholesterol 132, triglycerides 92, HDL 51, LDL 63.   Labs 11/01/2017: Serum glucose 16 mg, BUN 20, creatinine 0.94, eGFR 56 mL.  CMP Latest Ref Rng & Units 01/23/2018 01/22/2018 01/21/2018  Glucose 70 - 99 mg/dL 90 96 101(H)  BUN 8 - 23 mg/dL 22 28(H) 25(H)  Creatinine 0.44 - 1.00 mg/dL 1.31(H) 1.67(H) 1.68(H)  Sodium 135 - 145 mmol/L 136 139 139  Potassium 3.5 - 5.1 mmol/L 3.6 3.5 3.7  Chloride 98 - 111 mmol/L 105 105 105  CO2 22 - 32 mmol/L 25 27 26  Calcium 8.9 - 10.3 mg/dL 8.0(L) 7.9(L) 7.9(L)  Total Protein 6.5 - 8.1 g/dL - - -  Total Bilirubin 0.3 - 1.2 mg/dL - - -  Alkaline Phos 38 - 126 U/L - - -  AST 15 - 41 U/L - - -  ALT 0 - 44 U/L - - -   CBC Latest Ref Rng & Units 05/29/2018 03/24/2018 02/21/2018  WBC 4.0 - 10.5 K/uL 5.4 5.6 5.8  Hemoglobin 12.0 - 15.0 g/dL 12.9 11.5(L) 10.7(L)  Hematocrit 36.0 - 46.0 % 38.7 35.3(L) 33.3(L)  Platelets 150.0 - 400.0 K/uL 218.0 211.0 226.0    Lipid Panel     Component Value Date/Time   CHOL 118 09/24/2010 1000   TRIG 39 09/24/2010 1000   HDL 65 09/24/2010 1000   CHOLHDL 1.8 09/24/2010 1000   VLDL 8 09/24/2010 1000   LDLCALC 45 09/24/2010 1000   HEMOGLOBIN A1C Lab Results  Component Value Date   HGBA1C 6.1 (H) 11/03/2017   MPG 128.37 11/03/2017   TSH Recent Labs    11/03/17 0328  TSH 0.866   Medications   Current Outpatient Medications  Medication Instructions  . amiodarone (PACERONE) 100 mg, Oral, Daily, 1/2 tab  . betamethasone dipropionate (DIPROLENE) 0.05 % cream Topical, 2 times daily  . dorzolamide (TRUSOPT) 2 % ophthalmic solution 1 drop, Left Eye, 2 times daily, (0800 & 1900)  . ferrous sulfate 325 mg, Oral, 2 times daily  . latanoprost (XALATAN) 0.005 % ophthalmic solution 1 drop, Left Eye, Daily at bedtime, (1900)  . methocarbamol (ROBAXIN) 500 mg, Oral, 4 times daily PRN  . metoprolol tartrate (LOPRESSOR) 25 mg, Oral, 2 times daily  . Multiple Vitamins-Minerals (PRESERVISION AREDS 2) CAPS 1 capsule, Oral, 2 times daily  . nitroGLYCERIN (NITROSTAT) 0.4 mg, Sublingual, Every 5 min x3 PRN  . potassium chloride (K-DUR) 10 MEQ tablet 10 mEq, Oral, Daily, As directed with torsemide  . Propylene Glycol (SYSTANE BALANCE OP) 1 drop, Ophthalmic, 4 times daily PRN  . Rivaroxaban (XARELTO) 15 MG TABS tablet TAKE (1) TABLET BY MOUTH ONCE DAILY AFTER SUPPER.  . rosuvastatin (CRESTOR) 5 mg, Oral, Daily at bedtime, (2000)  . torsemide (DEMADEX) 20 mg, Oral, 2 times daily, Take AM and 2 PM for 3 lb weight gain or edema; otherwise, daily  . vitamin B-12 (CYANOCOBALAMIN) 500 mcg, Oral, Daily    Cardiac Studies:    Heart catheterization 09/24/10: PCI to proximal and mid LAD with 4x18 and 3.5x9 Resolute DES, D1 3.0x12 Resolute stent, Kissing balloon angioplasty. Normal circumflex and RCA. Normal LVEF.  TEE 01/27/2018:    Normal LV systolic function, EF 85-02% without wall motion abnormality.  Mild mitral annular  calcification and mild MR.  Mild TR and mild pulmonary hypertension.  Assessment     ICD-10-CM   1. Permanent atrial fibrillation  I48.21 EKG 12-Lead   CHA2DS2-VASc Score is 5. Yearly risk of stroke 6.2%  2. Chronic diastolic (congestive) heart failure (HCC)  I50.32   3. Coronary artery disease involving native coronary artery of native heart without angina pectoris  I25.10   4. Essential hypertension  I10     EKG 11/01/2018: Atrial fibrillation with controlled ventricular response at the rate of 83 bpm, normal axis, right bundle branch block.  Nonspecific T abnormality. No significant change from  Prior EKG.   Recommendations:   Patient here on a six-month office visit and follow-up of proximal atrial fibrillation, coronary artery disease and hypertension.  I reviewed her EKG, she is in permanent atrial fibrillation.  She is now on appropriate anticoagulation, fortunately has not had any recurrence of GI bleed.  She has not had any recurrence of angina pectoris.  She is presently doing well and does not appear to be in any acute decompensated heart failure as well.  She is now motivated with her son and is made lifestyle changes and has maintained weight loss.  From cardiac standpoint she is remained stable, I do not have recent CBC but patient states that they're all within normal limits.  Her labs including lipids are well controlled.  I'll see her back in one year for follow-up.  [name], MD, [facility] 11/01/2018, 2:54 PM [facility] [place] Pager: [phone] Office: [phone] If no answer Cell [phone]

## 2018-11-02 ENCOUNTER — Other Ambulatory Visit: Payer: Self-pay | Admitting: Cardiology

## 2018-11-02 DIAGNOSIS — I5032 Chronic diastolic (congestive) heart failure: Secondary | ICD-10-CM

## 2018-11-03 DIAGNOSIS — H401121 Primary open-angle glaucoma, left eye, mild stage: Secondary | ICD-10-CM | POA: Diagnosis not present

## 2018-11-03 DIAGNOSIS — H40001 Preglaucoma, unspecified, right eye: Secondary | ICD-10-CM | POA: Diagnosis not present

## 2018-11-03 DIAGNOSIS — H353130 Nonexudative age-related macular degeneration, bilateral, stage unspecified: Secondary | ICD-10-CM | POA: Diagnosis not present

## 2018-11-16 ENCOUNTER — Other Ambulatory Visit: Payer: Self-pay | Admitting: Cardiology

## 2018-11-16 NOTE — Telephone Encounter (Signed)
Please fill

## 2018-12-08 DIAGNOSIS — Z23 Encounter for immunization: Secondary | ICD-10-CM | POA: Diagnosis not present

## 2018-12-11 ENCOUNTER — Other Ambulatory Visit: Payer: Self-pay | Admitting: Cardiology

## 2018-12-11 DIAGNOSIS — I5032 Chronic diastolic (congestive) heart failure: Secondary | ICD-10-CM

## 2019-01-02 NOTE — Telephone Encounter (Signed)
Error

## 2019-01-11 ENCOUNTER — Other Ambulatory Visit: Payer: Self-pay | Admitting: Cardiology

## 2019-01-11 DIAGNOSIS — I5032 Chronic diastolic (congestive) heart failure: Secondary | ICD-10-CM

## 2019-02-08 ENCOUNTER — Other Ambulatory Visit: Payer: Self-pay | Admitting: Cardiology

## 2019-02-08 DIAGNOSIS — I5032 Chronic diastolic (congestive) heart failure: Secondary | ICD-10-CM

## 2019-02-16 ENCOUNTER — Other Ambulatory Visit: Payer: Self-pay | Admitting: Cardiology

## 2019-03-04 IMAGING — DX DG CHEST 1V
1 series · 1 of 1 positions shown · non-contrast
Comparison: Prior chest x-ray 01/21/2015

CLINICAL DATA: 83-year-old female with dizziness and anemia

EXAM:
CHEST  1 VIEW

[chest ap]
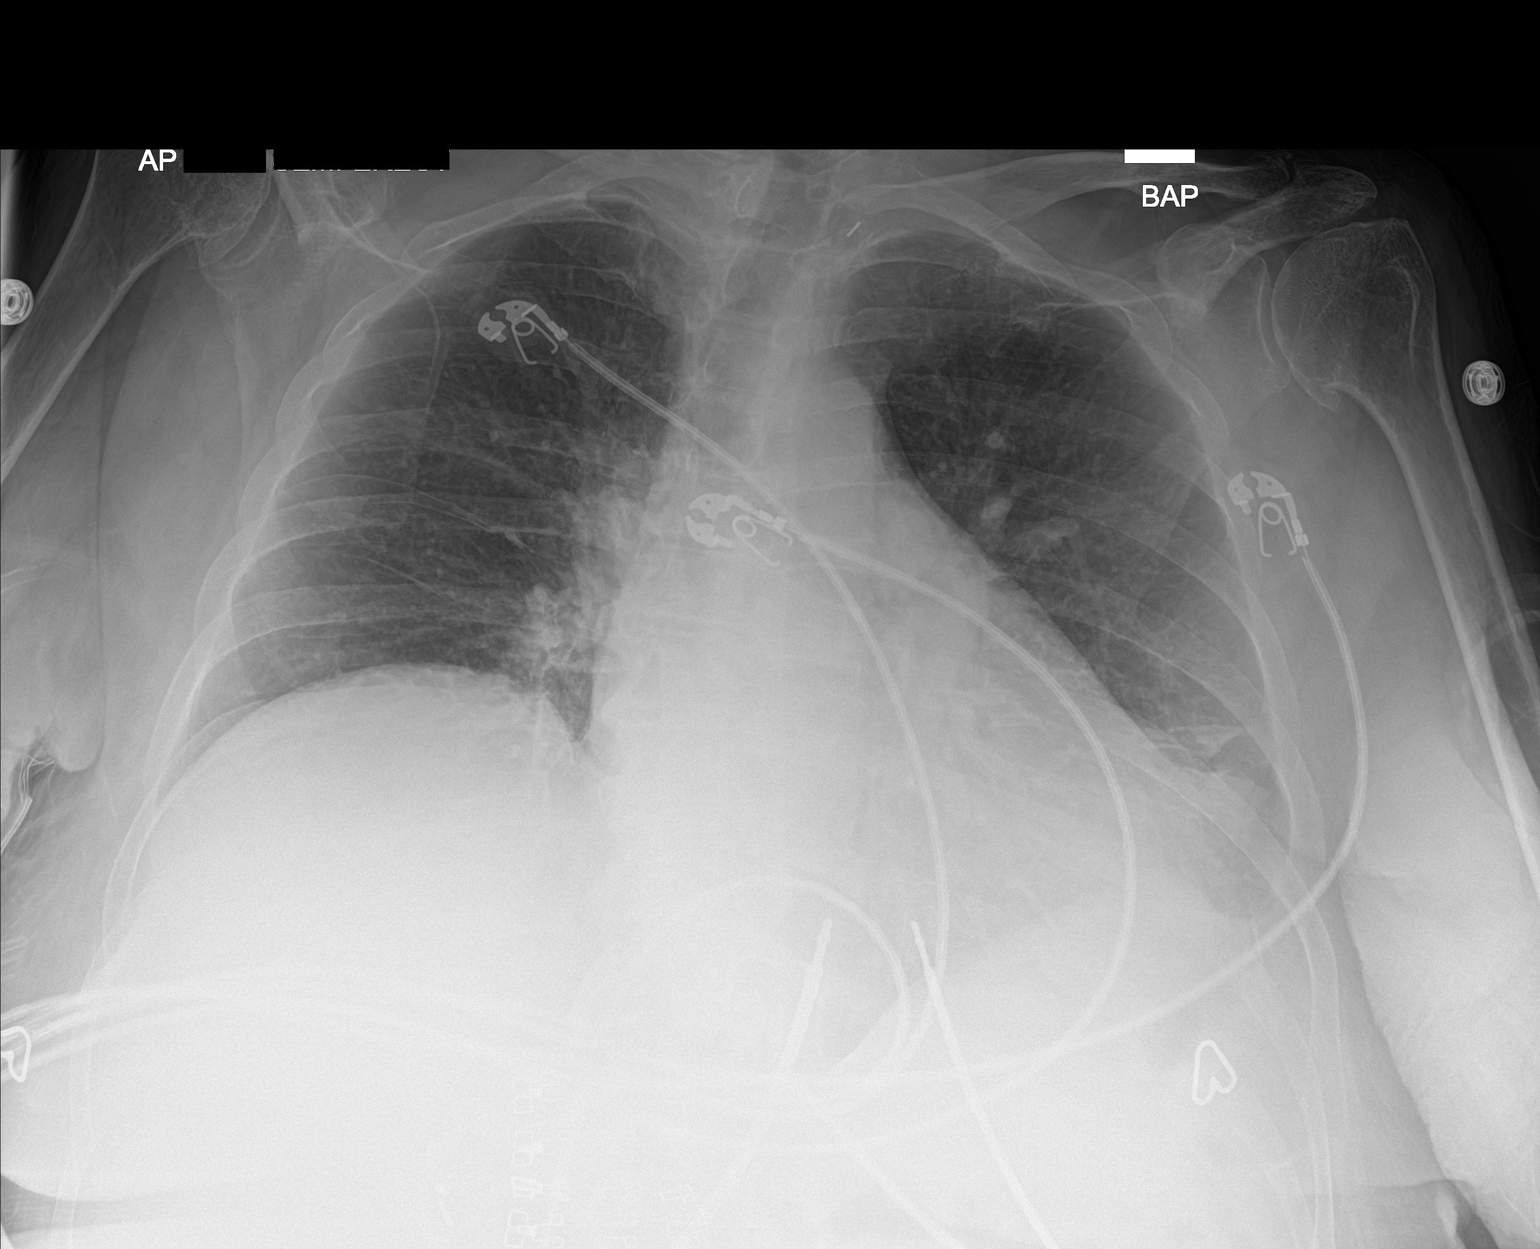

[1 of 1 positions shown; findings below may reference images not displayed]

FINDINGS: Stable cardiac and mediastinal contours. Low inspiratory volumes
with chronic atelectasis versus scarring in the lingula. No focal
airspace consolidation, pulmonary edema, large pleural effusion or
pneumothorax. No acute osseous abnormality. Bilateral glenohumeral
joint osteoarthritis.
IMPRESSION: Stable chest x-ray without evidence of acute cardiopulmonary
process.

## 2019-03-16 ENCOUNTER — Other Ambulatory Visit: Payer: Self-pay | Admitting: Cardiology

## 2019-03-16 DIAGNOSIS — I5032 Chronic diastolic (congestive) heart failure: Secondary | ICD-10-CM

## 2019-04-16 ENCOUNTER — Other Ambulatory Visit: Payer: Self-pay | Admitting: Cardiology

## 2019-04-17 ENCOUNTER — Other Ambulatory Visit: Payer: Self-pay | Admitting: Cardiology

## 2019-04-17 DIAGNOSIS — E559 Vitamin D deficiency, unspecified: Secondary | ICD-10-CM | POA: Diagnosis not present

## 2019-04-17 DIAGNOSIS — R7303 Prediabetes: Secondary | ICD-10-CM | POA: Diagnosis not present

## 2019-04-17 DIAGNOSIS — I5032 Chronic diastolic (congestive) heart failure: Secondary | ICD-10-CM

## 2019-04-17 DIAGNOSIS — E7800 Pure hypercholesterolemia, unspecified: Secondary | ICD-10-CM | POA: Diagnosis not present

## 2019-04-23 DIAGNOSIS — I4891 Unspecified atrial fibrillation: Secondary | ICD-10-CM | POA: Diagnosis not present

## 2019-04-23 DIAGNOSIS — L299 Pruritus, unspecified: Secondary | ICD-10-CM | POA: Diagnosis not present

## 2019-04-23 DIAGNOSIS — I10 Essential (primary) hypertension: Secondary | ICD-10-CM | POA: Diagnosis not present

## 2019-04-23 DIAGNOSIS — E559 Vitamin D deficiency, unspecified: Secondary | ICD-10-CM | POA: Diagnosis not present

## 2019-04-23 DIAGNOSIS — R7303 Prediabetes: Secondary | ICD-10-CM | POA: Diagnosis not present

## 2019-04-23 DIAGNOSIS — E7800 Pure hypercholesterolemia, unspecified: Secondary | ICD-10-CM | POA: Diagnosis not present

## 2019-04-30 DIAGNOSIS — D225 Melanocytic nevi of trunk: Secondary | ICD-10-CM | POA: Diagnosis not present

## 2019-04-30 DIAGNOSIS — L570 Actinic keratosis: Secondary | ICD-10-CM | POA: Diagnosis not present

## 2019-04-30 DIAGNOSIS — D1801 Hemangioma of skin and subcutaneous tissue: Secondary | ICD-10-CM | POA: Diagnosis not present

## 2019-04-30 DIAGNOSIS — Z85828 Personal history of other malignant neoplasm of skin: Secondary | ICD-10-CM | POA: Diagnosis not present

## 2019-04-30 DIAGNOSIS — L821 Other seborrheic keratosis: Secondary | ICD-10-CM | POA: Diagnosis not present

## 2019-04-30 DIAGNOSIS — D485 Neoplasm of uncertain behavior of skin: Secondary | ICD-10-CM | POA: Diagnosis not present

## 2019-04-30 DIAGNOSIS — R238 Other skin changes: Secondary | ICD-10-CM | POA: Diagnosis not present

## 2019-04-30 DIAGNOSIS — L249 Irritant contact dermatitis, unspecified cause: Secondary | ICD-10-CM | POA: Diagnosis not present

## 2019-04-30 DIAGNOSIS — C44729 Squamous cell carcinoma of skin of left lower limb, including hip: Secondary | ICD-10-CM | POA: Diagnosis not present

## 2019-04-30 DIAGNOSIS — L738 Other specified follicular disorders: Secondary | ICD-10-CM | POA: Diagnosis not present

## 2019-04-30 DIAGNOSIS — L304 Erythema intertrigo: Secondary | ICD-10-CM | POA: Diagnosis not present

## 2019-04-30 DIAGNOSIS — L905 Scar conditions and fibrosis of skin: Secondary | ICD-10-CM | POA: Diagnosis not present

## 2019-04-30 DIAGNOSIS — L989 Disorder of the skin and subcutaneous tissue, unspecified: Secondary | ICD-10-CM | POA: Diagnosis not present

## 2019-05-08 DIAGNOSIS — H40001 Preglaucoma, unspecified, right eye: Secondary | ICD-10-CM | POA: Diagnosis not present

## 2019-05-08 DIAGNOSIS — H401121 Primary open-angle glaucoma, left eye, mild stage: Secondary | ICD-10-CM | POA: Diagnosis not present

## 2019-05-08 DIAGNOSIS — H353132 Nonexudative age-related macular degeneration, bilateral, intermediate dry stage: Secondary | ICD-10-CM | POA: Diagnosis not present

## 2019-05-25 DIAGNOSIS — C44729 Squamous cell carcinoma of skin of left lower limb, including hip: Secondary | ICD-10-CM | POA: Diagnosis not present

## 2019-05-25 DIAGNOSIS — L905 Scar conditions and fibrosis of skin: Secondary | ICD-10-CM | POA: Diagnosis not present

## 2019-05-25 DIAGNOSIS — D0359 Melanoma in situ of other part of trunk: Secondary | ICD-10-CM | POA: Diagnosis not present

## 2019-05-28 DIAGNOSIS — L509 Urticaria, unspecified: Secondary | ICD-10-CM | POA: Diagnosis not present

## 2019-05-28 DIAGNOSIS — L304 Erythema intertrigo: Secondary | ICD-10-CM | POA: Diagnosis not present

## 2019-06-11 DIAGNOSIS — L509 Urticaria, unspecified: Secondary | ICD-10-CM | POA: Diagnosis not present

## 2019-06-11 DIAGNOSIS — L298 Other pruritus: Secondary | ICD-10-CM | POA: Diagnosis not present

## 2019-06-11 DIAGNOSIS — L304 Erythema intertrigo: Secondary | ICD-10-CM | POA: Diagnosis not present

## 2019-06-13 DIAGNOSIS — L299 Pruritus, unspecified: Secondary | ICD-10-CM | POA: Diagnosis not present

## 2019-06-22 DIAGNOSIS — C44729 Squamous cell carcinoma of skin of left lower limb, including hip: Secondary | ICD-10-CM | POA: Diagnosis not present

## 2019-06-22 DIAGNOSIS — L905 Scar conditions and fibrosis of skin: Secondary | ICD-10-CM | POA: Diagnosis not present

## 2019-07-13 ENCOUNTER — Other Ambulatory Visit: Payer: Self-pay

## 2019-07-13 MED ORDER — RIVAROXABAN 15 MG PO TABS: 15.0000 mg | ORAL_TABLET | Freq: Every day | ORAL | 4 refills | Status: DC

## 2019-08-02 ENCOUNTER — Other Ambulatory Visit: Payer: Self-pay | Admitting: Cardiology

## 2019-08-02 DIAGNOSIS — I10 Essential (primary) hypertension: Secondary | ICD-10-CM

## 2019-08-28 DIAGNOSIS — L259 Unspecified contact dermatitis, unspecified cause: Secondary | ICD-10-CM | POA: Diagnosis not present

## 2019-08-28 DIAGNOSIS — D225 Melanocytic nevi of trunk: Secondary | ICD-10-CM | POA: Diagnosis not present

## 2019-08-28 DIAGNOSIS — L821 Other seborrheic keratosis: Secondary | ICD-10-CM | POA: Diagnosis not present

## 2019-08-28 DIAGNOSIS — L309 Dermatitis, unspecified: Secondary | ICD-10-CM | POA: Diagnosis not present

## 2019-08-28 DIAGNOSIS — D485 Neoplasm of uncertain behavior of skin: Secondary | ICD-10-CM | POA: Diagnosis not present

## 2019-08-28 DIAGNOSIS — Z85820 Personal history of malignant melanoma of skin: Secondary | ICD-10-CM | POA: Diagnosis not present

## 2019-08-28 DIAGNOSIS — L989 Disorder of the skin and subcutaneous tissue, unspecified: Secondary | ICD-10-CM | POA: Diagnosis not present

## 2019-08-28 DIAGNOSIS — Z85828 Personal history of other malignant neoplasm of skin: Secondary | ICD-10-CM | POA: Diagnosis not present

## 2019-08-28 DIAGNOSIS — L820 Inflamed seborrheic keratosis: Secondary | ICD-10-CM | POA: Diagnosis not present

## 2019-08-28 DIAGNOSIS — L570 Actinic keratosis: Secondary | ICD-10-CM | POA: Diagnosis not present

## 2019-08-28 DIAGNOSIS — L905 Scar conditions and fibrosis of skin: Secondary | ICD-10-CM | POA: Diagnosis not present

## 2019-08-30 DIAGNOSIS — I10 Essential (primary) hypertension: Secondary | ICD-10-CM | POA: Diagnosis not present

## 2019-08-30 DIAGNOSIS — L988 Other specified disorders of the skin and subcutaneous tissue: Secondary | ICD-10-CM | POA: Diagnosis not present

## 2019-09-11 DIAGNOSIS — L209 Atopic dermatitis, unspecified: Secondary | ICD-10-CM | POA: Diagnosis not present

## 2019-09-11 DIAGNOSIS — L2084 Intrinsic (allergic) eczema: Secondary | ICD-10-CM | POA: Diagnosis not present

## 2019-09-26 DIAGNOSIS — L2084 Intrinsic (allergic) eczema: Secondary | ICD-10-CM | POA: Diagnosis not present

## 2019-09-26 DIAGNOSIS — R21 Rash and other nonspecific skin eruption: Secondary | ICD-10-CM | POA: Diagnosis not present

## 2019-10-16 DIAGNOSIS — I4891 Unspecified atrial fibrillation: Secondary | ICD-10-CM | POA: Diagnosis not present

## 2019-10-16 DIAGNOSIS — E559 Vitamin D deficiency, unspecified: Secondary | ICD-10-CM | POA: Diagnosis not present

## 2019-10-16 DIAGNOSIS — R7303 Prediabetes: Secondary | ICD-10-CM | POA: Diagnosis not present

## 2019-10-16 DIAGNOSIS — I10 Essential (primary) hypertension: Secondary | ICD-10-CM | POA: Diagnosis not present

## 2019-10-23 DIAGNOSIS — L209 Atopic dermatitis, unspecified: Secondary | ICD-10-CM | POA: Diagnosis not present

## 2019-11-05 ENCOUNTER — Other Ambulatory Visit: Payer: Self-pay

## 2019-11-05 ENCOUNTER — Encounter: Payer: Self-pay | Admitting: Cardiology

## 2019-11-05 ENCOUNTER — Ambulatory Visit: Payer: Medicare Other | Admitting: Cardiology

## 2019-11-05 VITALS — BP 124/65 | HR 68 | Resp 16 | Ht 63.0 in | Wt 183.2 lb

## 2019-11-05 DIAGNOSIS — I5032 Chronic diastolic (congestive) heart failure: Secondary | ICD-10-CM

## 2019-11-05 DIAGNOSIS — I4821 Permanent atrial fibrillation: Secondary | ICD-10-CM

## 2019-11-05 DIAGNOSIS — I2510 Atherosclerotic heart disease of native coronary artery without angina pectoris: Secondary | ICD-10-CM | POA: Diagnosis not present

## 2019-11-05 DIAGNOSIS — I10 Essential (primary) hypertension: Secondary | ICD-10-CM

## 2019-11-05 NOTE — Progress Notes (Signed)
Primary Physician/Referring:  Jani Gravel, MD  Patient ID: [name], female    DOB: 1933/09/12, 86 y.o.   MRN: [id number]  Chief Complaint  Patient presents with  . Permanent Atrial Fibrillation  . Coronary Artery Disease  . Follow-up    1 year   HPI:    HPI: [name]  is a 86 y.o. Caucasian female with known coronary artery disease and LAD stenting in 2012, permanent atrial fibrillation  on Amio due to difficulty in rate control, failed cardioversion on 11/02/17 when she was admitted with symptomatic anemia from erosive gastritis, needed blood transfusion.  Again admitted on 01/21/18 with severe anemia with a hemoglobin of 5.0.  Again no obvious GI etiology was found, felt to be due to severe iron deficiency anemia.  Past medical history significant for hyperlipidemia and hypertensoin, severe degenerative disc disease, chronic back pain, arthritis.  She is now living with her son, has been extremely watchful with her diet.   She has had any recurrence of GI bleed and hemoglobin has been stable.  Except for chronic arthritis, she remains asymptomatic.  Past Medical History:  Diagnosis Date  . Acalculous cholecystitis   . Arthritis   . Blood transfusion   . Blood transfusion without reported diagnosis   . Cataract    bilateral-removed from both eyes  . Coronary artery disease    mi  7/12,  stress test 12/21/2011=>negative for ischemia [name])  . Diabetes mellitus    pt states she is not diabetic  . Diverticulitis   . Dizzy   . Dyslipidemia   . Fatigue   . GERD (gastroesophageal reflux disease)   . Glaucoma   . Hypertension   . Hypokalemia   . Iron deficiency anemia due to chronic blood loss from small bowel AVM's 01/22/2018  . Mitral regurgitation    mild to moderate, cardiac echo 12/02/2011, EF 51% [name])  . Myocardial infarction ([place])    7/12  . PONV (postoperative nausea and vomiting)    yrs ago  . Shortness of breath    Past Surgical History:   Procedure Laterality Date  . ABDOMINAL HYSTERECTOMY    . BILIARY DRAINAGE  9/12  . BIOPSY  11/04/2017   Procedure: BIOPSY;  Surgeon: Jackquline Denmark, MD;  Location: [facility] ENDOSCOPY;  Service: Endoscopy;;  . CARDIOVERSION N/A 02/12/2015   Procedure: CARDIOVERSION;  Surgeon: [name], MD;  Location: [place];  Service: Cardiovascular;  Laterality: N/A;  . CARDIOVERSION N/A 11/10/2017   Procedure: CARDIOVERSION;  Surgeon: Nigel Mormon, MD;  Location: [place] ENDOSCOPY;  Service: Cardiovascular;  Laterality: N/A;  . CATARACT EXTRACTION     2  . CHOLECYSTECTOMY  02/25/2011   Procedure: LAPAROSCOPIC CHOLECYSTECTOMY WITH INTRAOPERATIVE CHOLANGIOGRAM;  Surgeon: Gayland Curry, MD;  Location: [place];  Service: General;  Laterality: N/A;  . CORONARY ANGIOPLASTY WITH STENT PLACEMENT  09/24/10   drug eluting stents  . ESOPHAGOGASTRODUODENOSCOPY (EGD) WITH PROPOFOL N/A 11/04/2017   Procedure: ESOPHAGOGASTRODUODENOSCOPY (EGD) WITH PROPOFOL;  Surgeon: Jackquline Denmark, MD;  [facility];  Service: Endoscopy;  Laterality: N/A;  . GIVENS CAPSULE STUDY N/A 01/22/2018   Procedure: GIVENS CAPSULE STUDY;  Surgeon: Gatha Mayer, MD;  Location: [place];  Service: Endoscopy;  Laterality: N/A;  . PARATHYROIDECTOMY     tumor excision  . SHOULDER SURGERY     right  . SIGMOIDOSCOPY     flex sig in the 80's per pt.   Social History   Tobacco Use  .  Smoking status: Never Smoker  . Smokeless tobacco: Never Used  Substance Use Topics  . Alcohol use: Yes    Alcohol/week: 0.0 standard drinks    Comment: occ   Marital Status: Widowed   ROS  Review of Systems  Cardiovascular: Positive for leg swelling. Negative for chest pain and dyspnea on exertion.  Hematologic/Lymphatic: Bruises/bleeds easily.  Musculoskeletal: Positive for arthritis and back pain.  Gastrointestinal: Negative for melena.   Objective  Blood pressure 124/65, pulse 68, resp. rate 16, height '5\' 3"'  (1.6 m), weight 183 lb 3.2 oz  (83.1 kg), SpO2 95 %. Body mass index is 32.45 kg/m.   Vitals with BMI 11/05/2019 11/01/2018 05/01/2018  Height '5\' 3"'  '5\' 3"'  '5\' 3"'   Weight 183 lbs 3 oz 186 lbs 183 lbs 13 oz  BMI 32.46 09.62 83.66  Systolic 294 765 465  Diastolic 65 71 64  Pulse 68 69 86      Physical Exam Constitutional:      Appearance: She is well-developed.  Neck:     Thyroid: No thyromegaly.     Vascular: No JVD.  Cardiovascular:     Rate and Rhythm: Normal rate and regular rhythm.     Pulses: Intact distal pulses.          Carotid pulses are 2+ on the right side and 2+ on the left side.      Femoral pulses are 2+ on the right side and 2+ on the left side.      Popliteal pulses are 0 on the right side and 0 on the left side.       Dorsalis pedis pulses are 1+ on the right side and 1+ on the left side.     Heart sounds: No murmur heard.  No gallop.      Comments: S1 is variable, S2 is normal.  No gallop or murmur appreciated. No edema.  Pulmonary:     Effort: Pulmonary effort is normal.     Breath sounds: Normal breath sounds.  Abdominal:     General: Bowel sounds are normal.     Palpations: Abdomen is soft.     Comments: Obese abdomen.    Radiology: No results found.  Laboratory examination:   Labs 04/17/18/20: PCP HB 12.4/HCT 38.4, platelets 219.  Normal indicis.  Total cholesterol 132, triglycerides 92, HDL 51, LDL 63.   Labs 11/01/2017: Serum glucose 16 mg, BUN 20, creatinine 0.94, eGFR 56 mL.  CMP Latest Ref Rng & Units 01/23/2018 01/22/2018 01/21/2018  Glucose 70 - 99 mg/dL 90 96 101(H)  BUN 8 - 23 mg/dL 22 28(H) 25(H)  Creatinine 0.44 - 1.00 mg/dL 1.31(H) 1.67(H) 1.68(H)  Sodium 135 - 145 mmol/L 136 139 139  Potassium 3.5 - 5.1 mmol/L 3.6 3.5 3.7  Chloride 98 - 111 mmol/L 105 105 105  CO2 22 - 32 mmol/L '25 27 26  ' Calcium 8.9 - 10.3 mg/dL 8.0(L) 7.9(L) 7.9(L)  Total Protein 6.5 - 8.1 g/dL - - -  Total Bilirubin 0.3 - 1.2 mg/dL - - -  Alkaline Phos 38 - 126 U/L - - -  AST 15 - 41 U/L - - -   ALT 0 - 44 U/L - - -   CBC Latest Ref Rng & Units 05/29/2018 03/24/2018 02/21/2018  WBC 4.0 - 10.5 K/uL 5.4 5.6 5.8  Hemoglobin 12.0 - 15.0 g/dL 12.9 11.5(L) 10.7(L)  Hematocrit 36 - 46 % 38.7 35.3(L) 33.3(L)  Platelets 150 - 400 K/uL 218.0 211.0 226.0   Lipid  Panel     Component Value Date/Time   CHOL 118 09/24/2010 1000   TRIG 39 09/24/2010 1000   HDL 65 09/24/2010 1000   CHOLHDL 1.8 09/24/2010 1000   VLDL 8 09/24/2010 1000   LDLCALC 45 09/24/2010 1000   HEMOGLOBIN A1C Lab Results  Component Value Date   HGBA1C 6.1 (H) 11/03/2017   MPG 128.37 11/03/2017   TSH No results for input(s): TSH in the last 8760 hours.   External labs:  Cholesterol, total 149.000 10/16/2019 HDL 92.000 10/16/2019 LDL-C 44.000 10/16/2019 Triglycerides 64.000 10/16/2019  A1C 5.700 % 10/16/2019  Hemoglobin 12.500 G/ 06/13/2019  Creatinine, Serum 1.410 MG/ 10/16/2019 Potassium 3.600 01/23/2018 Magnesium N/D ALT (SGPT) 12.000 IU/ 10/16/2019 TSH 0.811 10/16/2019 BNP N/D INR 1.500 10/16/2019  Medications   Outpatient Medications Prior to Visit  Medication Sig Dispense Refill  . amiodarone (PACERONE) 200 MG tablet Take 1/2 (one-half) tablet by mouth once daily 90 tablet 3  . betamethasone dipropionate (DIPROLENE) 0.05 % cream Apply topically 2 (two) times daily.    . dorzolamide (TRUSOPT) 2 % ophthalmic solution Place 1 drop into the left eye 2 (two) times daily. (0800 & 1900)    . DUPIXENT 300 MG/2ML SOPN SMARTSIG:1 Pre-Filled Pen Syringe SUB-Q Every 2 Weeks    . ferrous sulfate 325 (65 FE) MG EC tablet Take 1 tablet (325 mg total) by mouth 2 (two) times daily. 60 tablet 3  . ketoconazole (NIZORAL) 2 % cream SMARTSIG:Sparingly Topical Twice Daily PRN    . latanoprost (XALATAN) 0.005 % ophthalmic solution Place 1 drop into the left eye at bedtime. (1900)  11  . methocarbamol (ROBAXIN) 500 MG tablet Take 500 mg by mouth 4 (four) times daily as needed (pain).     . metoprolol tartrate (LOPRESSOR) 25 MG  tablet Take 1 tablet by mouth twice daily 180 tablet 0  . Multiple Vitamins-Minerals (PRESERVISION AREDS 2) CAPS Take 1 capsule by mouth 2 (two) times daily.    . nitroGLYCERIN (NITROSTAT) 0.4 MG SL tablet Place 1 tablet (0.4 mg total) under the tongue every 5 (five) minutes x 3 doses as needed for chest pain. 25 tablet 2  . potassium chloride (KLOR-CON) 10 MEQ tablet TAKE 1 TABLET BY MOUTH ONCE DAILY AS DIRECTED WITH TORSEMIDE 30 tablet 6  . Propylene Glycol (SYSTANE BALANCE OP) Apply 1 drop to eye 4 (four) times daily as needed (dry eyes).     . Rivaroxaban (XARELTO) 15 MG TABS tablet Take 1 tablet (15 mg total) by mouth daily with supper. 30 tablet 4  . rosuvastatin (CRESTOR) 5 MG tablet Take 5 mg by mouth at bedtime. (2000)    . torsemide (DEMADEX) 20 MG tablet TAKE 1 TABLET BY MOUTH TWICE DAILY IN THE MORNING AND AT 2 PM FOR 3 POUND WEIGHT GAIN OR WEIGHT OR EDEMA OTHERWISE TAKE DAILY 60 tablet 6  . triamcinolone cream (KENALOG) 0.1 %     . vitamin B-12 (CYANOCOBALAMIN) 500 MCG tablet Take 1 tablet (500 mcg total) by mouth daily. 30 tablet 0   No facility-administered medications prior to visit.   No orders of the defined types were placed in this encounter.  There are no discontinued medications.    Cardiac Studies:    Heart catheterization 09/24/10: PCI to proximal and mid LAD with 4x18 and 3.5x9 Resolute DES, D1 3.0x12 Resolute stent, Kissing balloon angioplasty. Normal circumflex and RCA. Normal LVEF.  TEE 01/27/2018:  Normal LV systolic function, EF 24-09% without wall motion abnormality.  Mild mitral annular  calcification and mild MR.  Mild TR and mild pulmonary hypertension.  EKG 11/05/2019: Atrial fibrillation with controlled ventricular response at rate of 65 bpm, rightward axis, left posterior fascicular block.  IVCD.  Low-voltage complexes.  Nonspecific T abnormality.  EKG 11/01/2018: Atrial fibrillation with controlled ventricular response at the rate of 83 bpm, normal axis,  right bundle branch block.  Nonspecific T abnormality. No significant change from  Prior EKG.   Assessment     ICD-10-CM   1. Permanent atrial fibrillation (HCC)  I48.21 EKG 12-Lead  2. Chronic diastolic (congestive) heart failure (HCC)  I50.32   3. Coronary artery disease involving native coronary artery of native heart without angina pectoris  I25.10   4. Essential hypertension  I10    CHA2DS2-VASc Score is 6.  Yearly risk of stroke: 9.8% (A, F, HTN, CAD).  Score of 1=0.6; 2=2.2; 3=3.2; 4=4.8; 5=7.2; 6=9.8; 7=>9.8) -(CHF; HTN; vasc disease DM,  Female = 1; Age <65 =0; 65-74 = 1,  >75 =2; stroke/embolism= 2).   Recommendations:   [name]  is a 86 y.o. Caucasian female with known coronary artery disease and LAD stenting in 2012, permanent atrial fibrillation  on Amio due to difficulty in rate control, failed cardioversion on 11/02/17 when she was admitted with symptomatic anemia from erosive gastritis, needed blood transfusion.  Again admitted on 01/21/18 with severe anemia with a hemoglobin of 5.0.  Again no obvious GI etiology was found, felt to be due to severe iron deficiency anemia.  Past medical history significant for hyperlipidemia and hypertensoin, severe degenerative disc disease, chronic back pain, arthritis.  Patient here on a six-month office visit and follow-up of permanent atrial fibrillation, coronary artery disease and hypertension. She is now on appropriate anticoagulation, fortunately has not had any recurrence of GI bleed.  I reviewed her external labs, hemoglobin has remained stable although she is mildly anemic and stage III chronic kidney disease has also remained stable over the past 1 year.  She has not had any recurrence of angina pectoris.  She is presently doing well and does not appear to be in any acute decompensated heart failure as well.  She is now motivated with her son and is made lifestyle changes and has maintained weight loss.  Blood pressure is also  well controlled.  I did not make any changes to her medications, I will see her back in 6 months or sooner if problems.    [name], MD, [facility] 11/05/2019, 2:50 PM Office: [phone]

## 2019-11-12 DIAGNOSIS — R7303 Prediabetes: Secondary | ICD-10-CM | POA: Diagnosis not present

## 2019-11-12 DIAGNOSIS — Z0000 Encounter for general adult medical examination without abnormal findings: Secondary | ICD-10-CM | POA: Diagnosis not present

## 2019-11-12 DIAGNOSIS — Z7901 Long term (current) use of anticoagulants: Secondary | ICD-10-CM | POA: Diagnosis not present

## 2019-11-12 DIAGNOSIS — E7800 Pure hypercholesterolemia, unspecified: Secondary | ICD-10-CM | POA: Diagnosis not present

## 2019-11-12 DIAGNOSIS — I2510 Atherosclerotic heart disease of native coronary artery without angina pectoris: Secondary | ICD-10-CM | POA: Diagnosis not present

## 2019-11-12 DIAGNOSIS — I4891 Unspecified atrial fibrillation: Secondary | ICD-10-CM | POA: Diagnosis not present

## 2019-11-14 ENCOUNTER — Other Ambulatory Visit: Payer: Self-pay | Admitting: Cardiology

## 2019-11-14 DIAGNOSIS — I10 Essential (primary) hypertension: Secondary | ICD-10-CM

## 2019-11-19 DIAGNOSIS — H353132 Nonexudative age-related macular degeneration, bilateral, intermediate dry stage: Secondary | ICD-10-CM | POA: Diagnosis not present

## 2019-11-19 DIAGNOSIS — H40001 Preglaucoma, unspecified, right eye: Secondary | ICD-10-CM | POA: Diagnosis not present

## 2019-11-19 DIAGNOSIS — H401121 Primary open-angle glaucoma, left eye, mild stage: Secondary | ICD-10-CM | POA: Diagnosis not present

## 2019-11-20 DIAGNOSIS — D225 Melanocytic nevi of trunk: Secondary | ICD-10-CM | POA: Diagnosis not present

## 2019-11-20 DIAGNOSIS — L2084 Intrinsic (allergic) eczema: Secondary | ICD-10-CM | POA: Diagnosis not present

## 2019-11-20 DIAGNOSIS — L304 Erythema intertrigo: Secondary | ICD-10-CM | POA: Diagnosis not present

## 2019-11-20 DIAGNOSIS — L821 Other seborrheic keratosis: Secondary | ICD-10-CM | POA: Diagnosis not present

## 2019-11-20 DIAGNOSIS — Z85820 Personal history of malignant melanoma of skin: Secondary | ICD-10-CM | POA: Diagnosis not present

## 2019-11-20 DIAGNOSIS — L905 Scar conditions and fibrosis of skin: Secondary | ICD-10-CM | POA: Diagnosis not present

## 2019-11-20 DIAGNOSIS — L814 Other melanin hyperpigmentation: Secondary | ICD-10-CM | POA: Diagnosis not present

## 2019-11-20 DIAGNOSIS — D1801 Hemangioma of skin and subcutaneous tissue: Secondary | ICD-10-CM | POA: Diagnosis not present

## 2019-11-21 ENCOUNTER — Other Ambulatory Visit: Payer: Self-pay | Admitting: Cardiology

## 2019-11-21 DIAGNOSIS — I5032 Chronic diastolic (congestive) heart failure: Secondary | ICD-10-CM

## 2019-12-18 ENCOUNTER — Other Ambulatory Visit: Payer: Self-pay | Admitting: Cardiology

## 2019-12-18 DIAGNOSIS — I5032 Chronic diastolic (congestive) heart failure: Secondary | ICD-10-CM

## 2020-01-30 ENCOUNTER — Other Ambulatory Visit: Payer: Self-pay | Admitting: Cardiology

## 2020-01-30 DIAGNOSIS — I5032 Chronic diastolic (congestive) heart failure: Secondary | ICD-10-CM

## 2020-01-30 NOTE — Telephone Encounter (Signed)
Refill request

## 2020-02-19 DIAGNOSIS — Z23 Encounter for immunization: Secondary | ICD-10-CM | POA: Diagnosis not present

## 2020-02-27 ENCOUNTER — Other Ambulatory Visit: Payer: Self-pay | Admitting: Cardiology

## 2020-02-27 DIAGNOSIS — L570 Actinic keratosis: Secondary | ICD-10-CM | POA: Diagnosis not present

## 2020-02-27 DIAGNOSIS — L814 Other melanin hyperpigmentation: Secondary | ICD-10-CM | POA: Diagnosis not present

## 2020-02-27 DIAGNOSIS — L2084 Intrinsic (allergic) eczema: Secondary | ICD-10-CM | POA: Diagnosis not present

## 2020-02-27 DIAGNOSIS — L821 Other seborrheic keratosis: Secondary | ICD-10-CM | POA: Diagnosis not present

## 2020-02-27 DIAGNOSIS — L304 Erythema intertrigo: Secondary | ICD-10-CM | POA: Diagnosis not present

## 2020-02-27 DIAGNOSIS — I10 Essential (primary) hypertension: Secondary | ICD-10-CM

## 2020-02-27 DIAGNOSIS — Z85820 Personal history of malignant melanoma of skin: Secondary | ICD-10-CM | POA: Diagnosis not present

## 2020-02-27 DIAGNOSIS — Z85828 Personal history of other malignant neoplasm of skin: Secondary | ICD-10-CM | POA: Diagnosis not present

## 2020-02-27 DIAGNOSIS — R202 Paresthesia of skin: Secondary | ICD-10-CM | POA: Diagnosis not present

## 2020-02-27 DIAGNOSIS — D485 Neoplasm of uncertain behavior of skin: Secondary | ICD-10-CM | POA: Diagnosis not present

## 2020-02-27 DIAGNOSIS — D225 Melanocytic nevi of trunk: Secondary | ICD-10-CM | POA: Diagnosis not present

## 2020-02-27 DIAGNOSIS — D1801 Hemangioma of skin and subcutaneous tissue: Secondary | ICD-10-CM | POA: Diagnosis not present

## 2020-02-27 DIAGNOSIS — L905 Scar conditions and fibrosis of skin: Secondary | ICD-10-CM | POA: Diagnosis not present

## 2020-03-05 ENCOUNTER — Other Ambulatory Visit: Payer: Self-pay | Admitting: Cardiology

## 2020-03-05 DIAGNOSIS — I5032 Chronic diastolic (congestive) heart failure: Secondary | ICD-10-CM

## 2020-04-03 ENCOUNTER — Other Ambulatory Visit: Payer: Self-pay | Admitting: Cardiology

## 2020-04-03 DIAGNOSIS — I5032 Chronic diastolic (congestive) heart failure: Secondary | ICD-10-CM

## 2020-04-22 ENCOUNTER — Other Ambulatory Visit: Payer: Self-pay | Admitting: Cardiology

## 2020-05-05 ENCOUNTER — Encounter: Payer: Self-pay | Admitting: Cardiology

## 2020-05-05 ENCOUNTER — Ambulatory Visit: Payer: Medicare Other | Admitting: Cardiology

## 2020-05-05 ENCOUNTER — Other Ambulatory Visit: Payer: Self-pay

## 2020-05-05 ENCOUNTER — Other Ambulatory Visit: Payer: Self-pay | Admitting: Cardiology

## 2020-05-05 VITALS — BP 130/72 | HR 61 | Temp 97.4°F | Resp 17 | Ht 63.0 in | Wt 184.0 lb

## 2020-05-05 DIAGNOSIS — I10 Essential (primary) hypertension: Secondary | ICD-10-CM

## 2020-05-05 DIAGNOSIS — I2510 Atherosclerotic heart disease of native coronary artery without angina pectoris: Secondary | ICD-10-CM

## 2020-05-05 DIAGNOSIS — I5032 Chronic diastolic (congestive) heart failure: Secondary | ICD-10-CM | POA: Diagnosis not present

## 2020-05-05 DIAGNOSIS — I4821 Permanent atrial fibrillation: Secondary | ICD-10-CM | POA: Diagnosis not present

## 2020-05-05 MED ORDER — POTASSIUM CHLORIDE ER 10 MEQ PO TBCR: EXTENDED_RELEASE_TABLET | ORAL | 3 refills | Status: DC

## 2020-05-05 MED ORDER — METOPROLOL TARTRATE 25 MG PO TABS: 25.0000 mg | ORAL_TABLET | Freq: Two times a day (BID) | ORAL | 3 refills | Status: DC

## 2020-05-05 MED ORDER — TORSEMIDE 20 MG PO TABS: ORAL_TABLET | ORAL | 3 refills | Status: DC

## 2020-05-05 MED ORDER — RIVAROXABAN 15 MG PO TABS: ORAL_TABLET | ORAL | 11 refills | Status: AC

## 2020-05-05 NOTE — Progress Notes (Signed)
Primary Physician/Referring:  Holland Commons, FNP  Patient ID: [name], female    DOB: 1933/05/19, 87 y.o.   MRN: [id number]  Chief Complaint  Patient presents with  . Coronary Artery Disease  . Atrial Fibrillation  . Follow-up    6 month   HPI:    HPI: [name]  is a 87 y.o. Caucasian female with known coronary artery disease and LAD stenting in 2012, permanent atrial fibrillation  on Amio due to difficulty in rate control, failed cardioversion on 11/02/17 when she was admitted with symptomatic anemia from erosive gastritis, needed blood transfusion.  Again admitted on 01/21/18 with severe anemia with a hemoglobin of 5.0.  Again no obvious GI etiology was found, felt to be due to severe iron deficiency anemia.  Past medical history significant for hyperlipidemia and hypertensoin, severe degenerative disc disease, chronic back pain, arthritis.  She is now living with her son, has been extremely watchful with her diet.   She has had any recurrence of GI bleed and hemoglobin has been stable.  Except for chronic arthritis, and easy bruising, she remains asymptomatic.  Past Medical History:  Diagnosis Date  . Acalculous cholecystitis   . Arthritis   . Blood transfusion   . Blood transfusion without reported diagnosis   . Cataract    bilateral-removed from both eyes  . Coronary artery disease    mi  7/12,  stress test 12/21/2011=>negative for ischemia [name])  . Diabetes mellitus    pt states she is not diabetic  . Diverticulitis   . Dizzy   . Dyslipidemia   . Fatigue   . GERD (gastroesophageal reflux disease)   . Glaucoma   . Hypertension   . Hypokalemia   . Iron deficiency anemia due to chronic blood loss from small bowel AVM's 01/22/2018  . Mitral regurgitation    mild to moderate, cardiac echo 12/02/2011, EF 51% [name])  . Myocardial infarction ([place])    7/12  . PONV (postoperative nausea and vomiting)    yrs ago  . Shortness of breath     Past Surgical History:  Procedure Laterality Date  . ABDOMINAL HYSTERECTOMY    . BILIARY DRAINAGE  9/12  . BIOPSY  11/04/2017   Procedure: BIOPSY;  Surgeon: Jackquline Denmark, MD;  Location: [facility] ENDOSCOPY;  Service: Endoscopy;;  . CARDIOVERSION N/A 02/12/2015   Procedure: CARDIOVERSION;  Surgeon: [name], MD;  Location: [place];  Service: Cardiovascular;  Laterality: N/A;  . CARDIOVERSION N/A 11/10/2017   Procedure: CARDIOVERSION;  Surgeon: Nigel Mormon, MD;  Location: [place] ENDOSCOPY;  Service: Cardiovascular;  Laterality: N/A;  . CATARACT EXTRACTION     2  . CHOLECYSTECTOMY  02/25/2011   Procedure: LAPAROSCOPIC CHOLECYSTECTOMY WITH INTRAOPERATIVE CHOLANGIOGRAM;  Surgeon: Gayland Curry, MD;  Location: [place];  Service: General;  Laterality: N/A;  . CORONARY ANGIOPLASTY WITH STENT PLACEMENT  09/24/10   drug eluting stents  . ESOPHAGOGASTRODUODENOSCOPY (EGD) WITH PROPOFOL N/A 11/04/2017   Procedure: ESOPHAGOGASTRODUODENOSCOPY (EGD) WITH PROPOFOL;  Surgeon: Jackquline Denmark, MD;  Location: [facility] ENDOSCOPY;  Service: Endoscopy;  Laterality: N/A;  . GIVENS CAPSULE STUDY N/A 01/22/2018   Procedure: GIVENS CAPSULE STUDY;  Surgeon: Gatha Mayer, MD;  Location: [place];  Service: Endoscopy;  Laterality: N/A;  . PARATHYROIDECTOMY     tumor excision  . SHOULDER SURGERY     right  . SIGMOIDOSCOPY     flex sig in the 80's per pt.   Social History  Tobacco Use  . Smoking status: Never Smoker  . Smokeless tobacco: Never Used  Substance Use Topics  . Alcohol use: Yes    Alcohol/week: 0.0 standard drinks    Comment: occ   Marital Status: Widowed   ROS  Review of Systems  Cardiovascular: Positive for leg swelling. Negative for chest pain and dyspnea on exertion.  Hematologic/Lymphatic: Bruises/bleeds easily.  Musculoskeletal: Positive for arthritis and back pain.  Gastrointestinal: Negative for melena.   Objective  Blood pressure 130/72, pulse 61, temperature (!) 97.4 F (36.3  C), resp. rate 17, height $RemoveBe'5\' 3"'OBljodVCO$  (1.6 m), weight 184 lb (83.5 kg), SpO2 98 %. Body mass index is 32.59 kg/m.   Vitals with BMI 05/05/2020 11/05/2019 11/01/2018  Height $Remov'5\' 3"'poVlVa$  $Remove'5\' 3"'UfzjxIB$  $RemoveB'5\' 3"'azlhQFSw$   Weight 184 lbs 183 lbs 3 oz 186 lbs  BMI 32.6 54.09 81.19  Systolic 147 829 562  Diastolic 72 65 71  Pulse 61 68 69      Physical Exam Constitutional:      Appearance: She is well-developed.  Neck:     Thyroid: No thyromegaly.     Vascular: No JVD.  Cardiovascular:     Rate and Rhythm: Normal rate and regular rhythm.     Pulses: Intact distal pulses.          Carotid pulses are 2+ on the right side and 2+ on the left side.      Femoral pulses are 2+ on the right side and 2+ on the left side.      Popliteal pulses are 0 on the right side and 0 on the left side.       Dorsalis pedis pulses are 1+ on the right side and 1+ on the left side.     Heart sounds: No murmur heard. No gallop.      Comments: S1 is variable, S2 is normal.  No gallop or murmur appreciated. No edema.  Pulmonary:     Effort: Pulmonary effort is normal.     Breath sounds: Normal breath sounds.  Abdominal:     General: Bowel sounds are normal.     Palpations: Abdomen is soft.  Skin:    Findings: Bruising present.    Radiology: No results found.  Laboratory examination:   External labs:   Labs 10/16/2019:   Hb 13.1/HCT 40.1, platelets 192.  Normal indicis.  Serum glucose 98 mg, BUN 26, creatinine 1.41, EGFR 34 mL, sodium 136, potassium 4.0, CMP otherwise normal.  A1c 5.7%.  TSH normal.  Total cholesterol 149, triglycerides 64, HDL 92, LDL 44.  Vitamin D 31.5.  Labs 04/17/18/20:  HB 12.4/HCT 38.4, platelets 219.  Normal indicis.  Total cholesterol 132, triglycerides 92, HDL 51, LDL 63.   Labs 11/01/2017: Serum glucose 16 mg, BUN 20, creatinine 0.94, eGFR 56 mL.    Medications   Outpatient Medications Prior to Visit  Medication Sig Dispense Refill  . betamethasone dipropionate (DIPROLENE) 0.05 % cream Apply  topically 2 (two) times daily.    . dorzolamide (TRUSOPT) 2 % ophthalmic solution Place 1 drop into the left eye 2 (two) times daily. (0800 & 1900)    . DUPIXENT 300 MG/2ML SOPN SMARTSIG:1 Pre-Filled Pen Syringe SUB-Q Every 2 Weeks    . ferrous sulfate 325 (65 FE) MG EC tablet Take 1 tablet (325 mg total) by mouth 2 (two) times daily. 60 tablet 3  . ketoconazole (NIZORAL) 2 % cream SMARTSIG:Sparingly Topical Twice Daily PRN    . latanoprost (XALATAN) 0.005 % ophthalmic  solution Place 1 drop into the left eye at bedtime. (1900)  11  . methocarbamol (ROBAXIN) 500 MG tablet Take 500 mg by mouth 4 (four) times daily as needed (pain).     . Multiple Vitamins-Minerals (PRESERVISION AREDS 2) CAPS Take 1 capsule by mouth 2 (two) times daily.    . nitroGLYCERIN (NITROSTAT) 0.4 MG SL tablet DISSOLVE ONE TABLET UNDER THE TONGUE EVERY 5 MINUTES AS NEEDED FOR CHEST PAIN.  DO NOT EXCEED A TOTAL OF 3 DOSES IN 15 MINUTES NOW 25 tablet 0  . PACERONE 200 MG tablet Take 1/2 (one-half) tablet by mouth once daily 45 tablet 3  . Propylene Glycol (SYSTANE BALANCE OP) Apply 1 drop to eye 4 (four) times daily as needed (dry eyes).     . rosuvastatin (CRESTOR) 5 MG tablet Take 5 mg by mouth at bedtime. (2000)    . triamcinolone cream (KENALOG) 0.1 %     . vitamin B-12 (CYANOCOBALAMIN) 500 MCG tablet Take 1 tablet (500 mcg total) by mouth daily. 30 tablet 0  . metoprolol tartrate (LOPRESSOR) 25 MG tablet Take 1 tablet by mouth twice daily 180 tablet 0  . potassium chloride (KLOR-CON) 10 MEQ tablet TAKE 1 TABLET BY MOUTH ONCE DAILY WITH  TORESEMIDE 30 tablet 0  . torsemide (DEMADEX) 20 MG tablet TAKE 1 TABLET BY MOUTH TWICE DAILY IN THE MORNING AND AT 2 PM FOR 3 POUND WEIGHT GAIN OR WEIGHT OR EDEMA OTHERWISE TAKE DAILY 60 tablet 6  . XARELTO 15 MG TABS tablet TAKE 1 TABLET BY MOUTH ONCE DAILY AFTER SUPPER 30 tablet 0   No facility-administered medications prior to visit.   Meds ordered this encounter  Medications  .  Rivaroxaban (XARELTO) 15 MG TABS tablet    Sig: TAKE 1 TABLET BY MOUTH ONCE DAILY AFTER SUPPER    Dispense:  30 tablet    Refill:  11  . potassium chloride (KLOR-CON) 10 MEQ tablet    Sig: TAKE 1 TABLET BY MOUTH ONCE DAILY WITH TORESEMIDE    Dispense:  90 tablet    Refill:  3  . metoprolol tartrate (LOPRESSOR) 25 MG tablet    Sig: Take 1 tablet (25 mg total) by mouth 2 (two) times daily.    Dispense:  180 tablet    Refill:  3  . torsemide (DEMADEX) 20 MG tablet    Sig: TAKE 1 TABLET BY MOUTH TWICE DAILY IN THE MORNING AND AT 2 PM FOR 3 POUND WEIGHT GAIN OR WEIGHT OR EDEMA OTHERWISE TAKE DAILY    Dispense:  90 tablet    Refill:  3   Medications Discontinued During This Encounter  Medication Reason  . torsemide (DEMADEX) 20 MG tablet   . metoprolol tartrate (LOPRESSOR) 25 MG tablet Reorder  . XARELTO 15 MG TABS tablet Reorder  . potassium chloride (KLOR-CON) 10 MEQ tablet Reorder      Cardiac Studies:    Heart catheterization 09/24/10: PCI to proximal and mid LAD with 4x18 and 3.5x9 Resolute DES, D1 3.0x12 Resolute stent, Kissing balloon angioplasty. Normal circumflex and RCA. Normal LVEF.  TEE 01/27/2018:  Normal LV systolic function, EF 79-98% without wall motion abnormality.  Mild mitral annular calcification and mild MR.  Mild TR and mild pulmonary hypertension.  EKG:   EKG 05/05/2020: Atrial fibrillation with controlled ventricular response at rate of 67 bpm, normal axis, right bundle branch block.  Nonspecific T abnormality.  No significant change from prior EKG. 11/05/2019.  Assessment     ICD-10-CM  1. Coronary artery disease involving native coronary artery of native heart without angina pectoris  I25.10 EKG 12-Lead  2. Permanent atrial fibrillation (HCC)  I48.21 Rivaroxaban (XARELTO) 15 MG TABS tablet  3. Chronic diastolic (congestive) heart failure (HCC)  I50.32 potassium chloride (KLOR-CON) 10 MEQ tablet    torsemide (DEMADEX) 20 MG tablet  4. Essential  hypertension  I10 metoprolol tartrate (LOPRESSOR) 25 MG tablet    CHA2DS2-VASc Score is 6.  Yearly risk of stroke: 9.8% (A, F, HTN, CAD).  Score of 1=0.6; 2=2.2; 3=3.2; 4=4.8; 5=7.2; 6=9.8; 7=>9.8) -(CHF; HTN; vasc disease DM,  Female = 1; Age <65 =0; 65-74 = 1,  >75 =2; stroke/embolism= 2).   Meds ordered this encounter  Medications  . Rivaroxaban (XARELTO) 15 MG TABS tablet    Sig: TAKE 1 TABLET BY MOUTH ONCE DAILY AFTER SUPPER    Dispense:  30 tablet    Refill:  11  . potassium chloride (KLOR-CON) 10 MEQ tablet    Sig: TAKE 1 TABLET BY MOUTH ONCE DAILY WITH TORESEMIDE    Dispense:  90 tablet    Refill:  3  . metoprolol tartrate (LOPRESSOR) 25 MG tablet    Sig: Take 1 tablet (25 mg total) by mouth 2 (two) times daily.    Dispense:  180 tablet    Refill:  3  . torsemide (DEMADEX) 20 MG tablet    Sig: TAKE 1 TABLET BY MOUTH TWICE DAILY IN THE MORNING AND AT 2 PM FOR 3 POUND WEIGHT GAIN OR WEIGHT OR EDEMA OTHERWISE TAKE DAILY    Dispense:  90 tablet    Refill:  3   Medications Discontinued During This Encounter  Medication Reason  . torsemide (DEMADEX) 20 MG tablet   . metoprolol tartrate (LOPRESSOR) 25 MG tablet Reorder  . XARELTO 15 MG TABS tablet Reorder  . potassium chloride (KLOR-CON) 10 MEQ tablet Reorder    Orders Placed This Encounter  Procedures  . EKG 12-Lead      Recommendations:   [name]  is a 87 y.o. Caucasian female with known coronary artery disease and LAD stenting in 2012, permanent atrial fibrillation  on Amio due to difficulty in rate control, failed cardioversion on 11/02/17 when she was admitted with symptomatic anemia from erosive gastritis, needed blood transfusion.  Again admitted on 01/21/18 with severe anemia with a hemoglobin of 5.0.  Again no obvious GI etiology was found, felt to be due to severe iron deficiency anemia.  Past medical history significant for hyperlipidemia and hypertensoin, severe degenerative disc disease, chronic back pain,  arthritis.  Patient here on a six-month office visit and follow-up of permanent atrial fibrillation, coronary artery disease and hypertension.  He has not had any recurrence of GI bleed on anticoagulation.  Tolerating Xarelto.  Heart rate is well controlled on 100 mg of amiodarone.  Blood pressure is also well controlled.  I do not have her recent labs, she does have mild chronic renal insufficiency.  She is scheduled for complete physical examination and labs next week with Lilyan Gilford, NP-C.  She has not had any recurrence of angina pectoris, diastolic heart failure is remained stable without acute recurrence.  She is now living with her son, has been extremely watchful with her diet.   She has had any recurrence of GI bleed and hemoglobin has been stable.  Except for chronic arthritis, and easy bruising, she remains asymptomatic.   [name], MD, [facility] 05/05/2020, 1:40 PM Office: [phone]

## 2020-05-14 DIAGNOSIS — E7800 Pure hypercholesterolemia, unspecified: Secondary | ICD-10-CM | POA: Diagnosis not present

## 2020-05-14 DIAGNOSIS — R7303 Prediabetes: Secondary | ICD-10-CM | POA: Diagnosis not present

## 2020-05-14 DIAGNOSIS — I4891 Unspecified atrial fibrillation: Secondary | ICD-10-CM | POA: Diagnosis not present

## 2020-05-19 DIAGNOSIS — H353132 Nonexudative age-related macular degeneration, bilateral, intermediate dry stage: Secondary | ICD-10-CM | POA: Diagnosis not present

## 2020-05-19 DIAGNOSIS — H401121 Primary open-angle glaucoma, left eye, mild stage: Secondary | ICD-10-CM | POA: Diagnosis not present

## 2020-05-28 DIAGNOSIS — L304 Erythema intertrigo: Secondary | ICD-10-CM | POA: Diagnosis not present

## 2020-05-28 DIAGNOSIS — L2084 Intrinsic (allergic) eczema: Secondary | ICD-10-CM | POA: Diagnosis not present

## 2020-05-28 DIAGNOSIS — I8310 Varicose veins of unspecified lower extremity with inflammation: Secondary | ICD-10-CM | POA: Diagnosis not present

## 2020-05-28 DIAGNOSIS — D225 Melanocytic nevi of trunk: Secondary | ICD-10-CM | POA: Diagnosis not present

## 2020-05-28 DIAGNOSIS — D485 Neoplasm of uncertain behavior of skin: Secondary | ICD-10-CM | POA: Diagnosis not present

## 2020-05-28 DIAGNOSIS — Z85828 Personal history of other malignant neoplasm of skin: Secondary | ICD-10-CM | POA: Diagnosis not present

## 2020-05-28 DIAGNOSIS — C44329 Squamous cell carcinoma of skin of other parts of face: Secondary | ICD-10-CM | POA: Diagnosis not present

## 2020-05-28 DIAGNOSIS — D1801 Hemangioma of skin and subcutaneous tissue: Secondary | ICD-10-CM | POA: Diagnosis not present

## 2020-05-28 DIAGNOSIS — L905 Scar conditions and fibrosis of skin: Secondary | ICD-10-CM | POA: Diagnosis not present

## 2020-05-28 DIAGNOSIS — Z85820 Personal history of malignant melanoma of skin: Secondary | ICD-10-CM | POA: Diagnosis not present

## 2020-06-02 DIAGNOSIS — I10 Essential (primary) hypertension: Secondary | ICD-10-CM | POA: Diagnosis not present

## 2020-06-02 DIAGNOSIS — I4891 Unspecified atrial fibrillation: Secondary | ICD-10-CM | POA: Diagnosis not present

## 2020-06-02 DIAGNOSIS — R7303 Prediabetes: Secondary | ICD-10-CM | POA: Diagnosis not present

## 2020-06-02 DIAGNOSIS — E538 Deficiency of other specified B group vitamins: Secondary | ICD-10-CM | POA: Diagnosis not present

## 2020-06-02 DIAGNOSIS — E611 Iron deficiency: Secondary | ICD-10-CM | POA: Diagnosis not present

## 2020-06-02 DIAGNOSIS — E7800 Pure hypercholesterolemia, unspecified: Secondary | ICD-10-CM | POA: Diagnosis not present

## 2020-06-02 DIAGNOSIS — I2510 Atherosclerotic heart disease of native coronary artery without angina pectoris: Secondary | ICD-10-CM | POA: Diagnosis not present

## 2020-06-02 DIAGNOSIS — Z7901 Long term (current) use of anticoagulants: Secondary | ICD-10-CM | POA: Diagnosis not present

## 2020-06-04 NOTE — Progress Notes (Signed)
Labs 05/14/2020:  Hb 11.8/HCT 36.0, platelets 204.  Serum glucose '1 1 1 ' mg, BUN 29, creatinine 1.75, EGFR 26 mL, potassium 4.3.  A1c 5.7%.  Total cholesterol 162, triglycerides 60, HDL 102, LDL 48.

## 2020-06-16 DIAGNOSIS — L2084 Intrinsic (allergic) eczema: Secondary | ICD-10-CM | POA: Diagnosis not present

## 2020-06-16 DIAGNOSIS — I8310 Varicose veins of unspecified lower extremity with inflammation: Secondary | ICD-10-CM | POA: Diagnosis not present

## 2020-06-16 DIAGNOSIS — D0439 Carcinoma in situ of skin of other parts of face: Secondary | ICD-10-CM | POA: Diagnosis not present

## 2020-08-27 DIAGNOSIS — L905 Scar conditions and fibrosis of skin: Secondary | ICD-10-CM | POA: Diagnosis not present

## 2020-08-27 DIAGNOSIS — D229 Melanocytic nevi, unspecified: Secondary | ICD-10-CM | POA: Diagnosis not present

## 2020-08-27 DIAGNOSIS — I8310 Varicose veins of unspecified lower extremity with inflammation: Secondary | ICD-10-CM | POA: Diagnosis not present

## 2020-08-27 DIAGNOSIS — L304 Erythema intertrigo: Secondary | ICD-10-CM | POA: Diagnosis not present

## 2020-08-27 DIAGNOSIS — Z85820 Personal history of malignant melanoma of skin: Secondary | ICD-10-CM | POA: Diagnosis not present

## 2020-08-27 DIAGNOSIS — Z85828 Personal history of other malignant neoplasm of skin: Secondary | ICD-10-CM | POA: Diagnosis not present

## 2020-08-27 DIAGNOSIS — L2084 Intrinsic (allergic) eczema: Secondary | ICD-10-CM | POA: Diagnosis not present

## 2020-09-04 DIAGNOSIS — I10 Essential (primary) hypertension: Secondary | ICD-10-CM | POA: Diagnosis not present

## 2020-09-10 DIAGNOSIS — E559 Vitamin D deficiency, unspecified: Secondary | ICD-10-CM | POA: Diagnosis not present

## 2020-09-10 DIAGNOSIS — R7989 Other specified abnormal findings of blood chemistry: Secondary | ICD-10-CM | POA: Diagnosis not present

## 2020-09-10 DIAGNOSIS — R7303 Prediabetes: Secondary | ICD-10-CM | POA: Diagnosis not present

## 2020-09-10 DIAGNOSIS — E7800 Pure hypercholesterolemia, unspecified: Secondary | ICD-10-CM | POA: Diagnosis not present

## 2020-09-10 DIAGNOSIS — E538 Deficiency of other specified B group vitamins: Secondary | ICD-10-CM | POA: Diagnosis not present

## 2020-09-10 DIAGNOSIS — I10 Essential (primary) hypertension: Secondary | ICD-10-CM | POA: Diagnosis not present

## 2020-09-10 DIAGNOSIS — D649 Anemia, unspecified: Secondary | ICD-10-CM | POA: Diagnosis not present

## 2020-09-10 DIAGNOSIS — I4891 Unspecified atrial fibrillation: Secondary | ICD-10-CM | POA: Diagnosis not present

## 2020-11-17 ENCOUNTER — Ambulatory Visit: Payer: Medicare Other | Admitting: Cardiology

## 2020-11-18 ENCOUNTER — Ambulatory Visit: Payer: Medicare Other | Admitting: Cardiology

## 2020-11-18 ENCOUNTER — Encounter: Payer: Self-pay | Admitting: Cardiology

## 2020-11-18 ENCOUNTER — Other Ambulatory Visit: Payer: Self-pay

## 2020-11-18 VITALS — BP 118/84 | HR 83 | Temp 97.9°F | Resp 16 | Ht 63.0 in | Wt 168.4 lb

## 2020-11-18 DIAGNOSIS — N184 Chronic kidney disease, stage 4 (severe): Secondary | ICD-10-CM

## 2020-11-18 DIAGNOSIS — I4821 Permanent atrial fibrillation: Secondary | ICD-10-CM | POA: Diagnosis not present

## 2020-11-18 DIAGNOSIS — I2510 Atherosclerotic heart disease of native coronary artery without angina pectoris: Secondary | ICD-10-CM | POA: Diagnosis not present

## 2020-11-18 DIAGNOSIS — I5032 Chronic diastolic (congestive) heart failure: Secondary | ICD-10-CM | POA: Diagnosis not present

## 2020-11-18 DIAGNOSIS — I10 Essential (primary) hypertension: Secondary | ICD-10-CM | POA: Diagnosis not present

## 2020-11-18 NOTE — Progress Notes (Signed)
Primary Physician/Referring:  Holland Commons, FNP  Patient ID: [name], female    DOB: February 18, 1934, 87 y.o.   MRN: [id number]  Chief Complaint  Patient presents with   Coronary Artery Disease   Atrial Fibrillation    6 MONTHS   HPI:    HPI: [name]  is a 87 y.o. Caucasian female with coronary artery disease and LAD stenting in 2012, permanent atrial fibrillation  on Amio due to difficulty in rate control, failed cardioversion on 11/02/17 when she was admitted with symptomatic anemia from erosive gastritis, needed blood transfusion.  Again admitted on 01/21/18 with severe anemia with a hemoglobin of 5.0.  Again no obvious GI etiology was found, felt to be due to severe iron deficiency anemia.  Past medical history significant for hyperlipidemia and hypertensoin, severe degenerative disc disease, chronic back pain, arthritis.  She is now living with her son, has been extremely watchful with her diet.  She presents here for 85-monthoffice visit, she is presently doing well and finally has lost about 20 pounds in weight, has noticed marked improvement in dyspnea and has not used any sublingual nitroglycerin or has not used furosemide for leg edema. She has had any recurrence of GI bleed and hemoglobin has been stable.  Except for chronic arthritis, and easy bruising, she remains asymptomatic.  Past Medical History:  Diagnosis Date   Acalculous cholecystitis    Arthritis    Blood transfusion    Blood transfusion without reported diagnosis    Cataract    bilateral-removed from both eyes   Coronary artery disease    mi  7/12,  stress test 12/21/2011=>negative for ischemia ([name]   Diabetes mellitus    pt states she is not diabetic   Diverticulitis    Dizzy    Dyslipidemia    Fatigue    GERD (gastroesophageal reflux disease)    Glaucoma    Hypertension    Hypokalemia    Iron deficiency anemia due to chronic blood loss from small bowel AVM's 01/22/2018    Mitral regurgitation    mild to moderate, cardiac echo 12/02/2011, EF 51% ([name]   [place]   PONV (postoperative nausea and vomiting)    yrs ago   Shortness of breath    Past Surgical History:  Procedure Laterality Date   ABDOMINAL HYSTERECTOMY     BILIARY DRAINAGE  9/12   BIOPSY  11/04/2017   Procedure: BIOPSY;  Surgeon: GJackquline Denmark MD;  [facility];  Service: Endoscopy;;   CARDIOVERSION N/A 02/12/2015   Procedure: CARDIOVERSION;  Surgeon: JAdrian Prows MD;  [place]  Service: Cardiovascular;  Laterality: N/A;   CARDIOVERSION N/A 11/10/2017   Procedure: CARDIOVERSION;  Surgeon: PNigel Mormon MD;  [facility];  Service: Cardiovascular;  Laterality: N/A;   CATARACT EXTRACTION     2   CHOLECYSTECTOMY  02/25/2011   Procedure: LAPAROSCOPIC CHOLECYSTECTOMY WITH INTRAOPERATIVE CHOLANGIOGRAM;  Surgeon: EGayland Curry MD;  [place]  Service: General;  Laterality: N/A;   CORONARY ANGIOPLASTY WITH STENT PLACEMENT  09/24/10   drug eluting stents   ESOPHAGOGASTRODUODENOSCOPY (EGD) WITH PROPOFOL N/A 11/04/2017   Procedure: ESOPHAGOGASTRODUODENOSCOPY (EGD) WITH PROPOFOL;  Surgeon: GJackquline Denmark MD;  [place]  Service: Endoscopy;  Laterality: N/A;   GIVENS CAPSULE STUDY N/A 01/22/2018   Procedure: GIVENS CAPSULE STUDY;  Surgeon: GGatha Mayer MD;  [place]  Service: Endoscopy;  Laterality: N/A;  PARATHYROIDECTOMY     tumor excision   SHOULDER SURGERY     right   SIGMOIDOSCOPY     flex sig in the 80's per pt.   Social History   Tobacco Use   Smoking status: Never   Smokeless tobacco: Never  Substance Use Topics   Alcohol use: Yes    Alcohol/week: 0.0 standard drinks    Comment: occ   Marital Status: Widowed   ROS  Review of Systems  Constitutional: Positive for weight loss.  Cardiovascular:  Negative for chest pain, dyspnea on exertion and leg swelling.   Hematologic/Lymphatic: Bruises/bleeds easily.  Musculoskeletal:  Positive for arthritis and back pain.  Gastrointestinal:  Negative for melena.  Objective  Blood pressure 118/84, pulse 83, temperature 97.9 F (36.6 C), temperature source Temporal, resp. rate 16, height '5\' 3"'  (1.6 m), weight 168 lb 6.4 oz (76.4 kg), SpO2 95 %. Body mass index is 29.83 kg/m.   Vitals with BMI 11/18/2020 05/05/2020 11/05/2019  Height '5\' 3"'  '5\' 3"'  '5\' 3"'   Weight 168 lbs 6 oz 184 lbs 183 lbs 3 oz  BMI 29.84 48.2 70.78  Systolic 675 449 201  Diastolic 84 72 65  Pulse 83 61 68      Physical Exam Constitutional:      Appearance: She is well-developed.  Neck:     Thyroid: No thyromegaly.     Vascular: No carotid bruit or JVD.  Cardiovascular:     Rate and Rhythm: Normal rate. Rhythm irregularly irregular.     Pulses: Intact distal pulses.          Carotid pulses are 2+ on the right side and 2+ on the left side.      Femoral pulses are 2+ on the right side and 2+ on the left side.      Popliteal pulses are 0 on the right side and 0 on the left side.       Dorsalis pedis pulses are 1+ on the right side and 1+ on the left side.     Heart sounds: No murmur heard.   No gallop.  Pulmonary:     Effort: Pulmonary effort is normal.     Breath sounds: Normal breath sounds.  Abdominal:     General: Bowel sounds are normal.     Palpations: Abdomen is soft.  Musculoskeletal:        General: No swelling.  Skin:    General: Skin is warm.     Findings: Bruising present.   Radiology: No results found.  Laboratory examination:   External labs:  Labs 09/04/2020:  A1c 5.9%.  Iron studies within normal limits.  Hb 11.9/HCT 36.1, platelets 198.  Sodium 137, potassium 3.9, BUN 34, creatinine 1.99, EGFR 22 mL.  Labs 05/14/2020:   Hb 11.8/HCT 36.0, platelets 204.   Serum glucose '1 1 1 ' mg, BUN 29, creatinine 1.75, EGFR 26 mL, potassium 4.3.   A1c 5.7%.   Total cholesterol 162, triglycerides 60, HDL 102, LDL  48.   Medications   Outpatient Medications Prior to Visit  Medication Sig Dispense Refill   betamethasone dipropionate (DIPROLENE) 0.05 % cream Apply topically 2 (two) times daily.     dorzolamide (TRUSOPT) 2 % ophthalmic solution Place 1 drop into the left eye 2 (two) times daily. (0800 & 1900)     DUPIXENT 300 MG/2ML SOPN SMARTSIG:1 Pre-Filled Pen Syringe SUB-Q Every 2 Weeks     ketoconazole (NIZORAL) 2 % cream SMARTSIG:Sparingly Topical Twice Daily PRN  latanoprost (XALATAN) 0.005 % ophthalmic solution Place 1 drop into the left eye at bedtime. (1900)  11   methocarbamol (ROBAXIN) 500 MG tablet Take 500 mg by mouth 4 (four) times daily as needed (pain).      metoprolol tartrate (LOPRESSOR) 25 MG tablet Take 1 tablet (25 mg total) by mouth 2 (two) times daily. 180 tablet 3   Multiple Vitamins-Minerals (PRESERVISION AREDS 2) CAPS Take 1 capsule by mouth 2 (two) times daily.     nitroGLYCERIN (NITROSTAT) 0.4 MG SL tablet DISSOLVE ONE TABLET UNDER THE TONGUE EVERY 5 MINUTES AS NEEDED FOR CHEST PAIN.  DO NOT EXCEED A TOTAL OF 3 DOSES IN 15 MINUTES NOW 25 tablet 0   PACERONE 200 MG tablet Take 1/2 (one-half) tablet by mouth once daily 45 tablet 3   potassium chloride (KLOR-CON) 10 MEQ tablet TAKE 1 TABLET BY MOUTH ONCE DAILY WITH TORESEMIDE 90 tablet 3   Propylene Glycol (SYSTANE BALANCE OP) Apply 1 drop to eye 4 (four) times daily as needed (dry eyes).      Rivaroxaban (XARELTO) 15 MG TABS tablet TAKE 1 TABLET BY MOUTH ONCE DAILY AFTER SUPPER 30 tablet 11   rosuvastatin (CRESTOR) 5 MG tablet Take 5 mg by mouth at bedtime. (2000)     torsemide (DEMADEX) 20 MG tablet TAKE 1 TABLET BY MOUTH TWICE DAILY IN THE MORNING AND AT 2 PM FOR 3 POUND WEIGHT GAIN OR WEIGHT OR EDEMA OTHERWISE TAKE DAILY 90 tablet 3   triamcinolone cream (KENALOG) 0.1 %      vitamin B-12 (CYANOCOBALAMIN) 500 MCG tablet Take 1 tablet (500 mcg total) by mouth daily. 30 tablet 0   ferrous sulfate 325 (65 FE) MG EC tablet Take 1  tablet (325 mg total) by mouth 2 (two) times daily. 60 tablet 3   No facility-administered medications prior to visit.   No orders of the defined types were placed in this encounter.  There are no discontinued medications.   Cardiac Studies:    Heart catheterization 09/24/10: PCI to proximal and mid LAD with 4x18 and 3.5x9 Resolute DES, D1 3.0x12 Resolute stent, Kissing balloon angioplasty. Normal circumflex and RCA. Normal LVEF.  TEE 01/27/2018:  Normal LV systolic function, EF 41-93% without wall motion abnormality.  Mild mitral annular calcification and mild MR.  Mild TR and mild pulmonary hypertension.  EKG:  EKG 11/18/2020: Atrial fibrillation with controlled ventricular response at rate of 75 bpm, normal axis. right bundle branch block.Inferior ischemia No significant change from  EKG 05/05/2020.  Assessment     ICD-10-CM   1. Coronary artery disease involving native coronary artery of native heart without angina pectoris  I25.10 EKG 12-Lead    2. Permanent atrial fibrillation (HCC)  I48.21     3. Chronic diastolic (congestive) heart failure (HCC)  I50.32     4. Essential hypertension  I10     5. CKD (chronic kidney disease) stage 4, GFR 15-29 ml/min (HCC)  N18.4       CHA2DS2-VASc Score is 6.  Yearly risk of stroke: 9.8% (A, F, HTN, CAD).  Score of 1=0.6; 2=2.2; 3=3.2; 4=4.8; 5=7.2; 6=9.8; 7=>9.8) -(CHF; HTN; vasc disease DM,  Female = 1; Age <65 =0; 65-74 = 1,  >75 =2; stroke/embolism= 2).   No orders of the defined types were placed in this encounter.  There are no discontinued medications.   Orders Placed This Encounter  Procedures   EKG 12-Lead      Recommendations:   [name]  is  a 87 y.o. Caucasian female with known coronary artery disease and LAD stenting in 2012, permanent atrial fibrillation  on Amio due to difficulty in rate control, failed cardioversion on 11/02/17 when she was admitted with symptomatic anemia from erosive gastritis, needed blood  transfusion.  Again admitted on 01/21/18 with severe anemia with a hemoglobin of 5.0.  Again no obvious GI etiology was found, felt to be due to severe iron deficiency anemia.  Past medical history significant for hyperlipidemia and hypertensoin, severe degenerative disc disease, chronic back pain, arthritis.  Patient here on a six-month office visit and follow-up of permanent atrial fibrillation, coronary artery disease and hypertension.  He has not had any recurrence of GI bleed on anticoagulation.  Tolerating Xarelto.  Heart rate is well controlled on 100 mg of amiodarone.  Blood pressure is also well controlled.  Since she has moved in with her son, she has been eating healthy and has lost about close to 18 to 20 pounds in weight and has not had to use any furosemide for leg edema.  No clinical evidence of heart failure.  A. fib rate is well controlled.  I reviewed her recent labs, renal function has deteriorated, presently stage IV.  She was initially not happy to seeing a nephrologist as recommended by her PCP but advised her that in view of worsening renal function recently it is probably her best option.  She would like to repeat labs in a month or so and then consider consultation if renal insufficiency exists and persists.  From cardiac standpoint she has not had any angina pectoris as well.  Lipids are well controlled.  I will see her back in a year.    [name], MD, [facility] 11/18/2020, 6:05 PM Office: [phone]

## 2020-12-23 DIAGNOSIS — Z23 Encounter for immunization: Secondary | ICD-10-CM | POA: Diagnosis not present

## 2021-01-07 DIAGNOSIS — D649 Anemia, unspecified: Secondary | ICD-10-CM | POA: Diagnosis not present

## 2021-01-07 DIAGNOSIS — R7303 Prediabetes: Secondary | ICD-10-CM | POA: Diagnosis not present

## 2021-01-07 DIAGNOSIS — E7800 Pure hypercholesterolemia, unspecified: Secondary | ICD-10-CM | POA: Diagnosis not present

## 2021-01-07 DIAGNOSIS — E559 Vitamin D deficiency, unspecified: Secondary | ICD-10-CM | POA: Diagnosis not present

## 2021-01-09 DIAGNOSIS — H40011 Open angle with borderline findings, low risk, right eye: Secondary | ICD-10-CM | POA: Diagnosis not present

## 2021-01-09 DIAGNOSIS — H401121 Primary open-angle glaucoma, left eye, mild stage: Secondary | ICD-10-CM | POA: Diagnosis not present

## 2021-01-15 DIAGNOSIS — Z23 Encounter for immunization: Secondary | ICD-10-CM | POA: Diagnosis not present

## 2021-01-15 DIAGNOSIS — I4891 Unspecified atrial fibrillation: Secondary | ICD-10-CM | POA: Diagnosis not present

## 2021-01-15 DIAGNOSIS — I2510 Atherosclerotic heart disease of native coronary artery without angina pectoris: Secondary | ICD-10-CM | POA: Diagnosis not present

## 2021-01-15 DIAGNOSIS — N184 Chronic kidney disease, stage 4 (severe): Secondary | ICD-10-CM | POA: Diagnosis not present

## 2021-01-15 DIAGNOSIS — Z7901 Long term (current) use of anticoagulants: Secondary | ICD-10-CM | POA: Diagnosis not present

## 2021-01-15 DIAGNOSIS — R7303 Prediabetes: Secondary | ICD-10-CM | POA: Diagnosis not present

## 2021-01-15 DIAGNOSIS — E7800 Pure hypercholesterolemia, unspecified: Secondary | ICD-10-CM | POA: Diagnosis not present

## 2021-01-15 DIAGNOSIS — I10 Essential (primary) hypertension: Secondary | ICD-10-CM | POA: Diagnosis not present

## 2021-02-02 ENCOUNTER — Other Ambulatory Visit: Payer: Self-pay | Admitting: Cardiology

## 2021-02-02 NOTE — Telephone Encounter (Signed)
Refill request

## 2021-02-17 DIAGNOSIS — N179 Acute kidney failure, unspecified: Secondary | ICD-10-CM | POA: Diagnosis not present

## 2021-02-17 DIAGNOSIS — N1832 Chronic kidney disease, stage 3b: Secondary | ICD-10-CM | POA: Diagnosis not present

## 2021-02-17 DIAGNOSIS — I2510 Atherosclerotic heart disease of native coronary artery without angina pectoris: Secondary | ICD-10-CM | POA: Diagnosis not present

## 2021-02-17 DIAGNOSIS — N189 Chronic kidney disease, unspecified: Secondary | ICD-10-CM | POA: Diagnosis not present

## 2021-02-17 DIAGNOSIS — I129 Hypertensive chronic kidney disease with stage 1 through stage 4 chronic kidney disease, or unspecified chronic kidney disease: Secondary | ICD-10-CM | POA: Diagnosis not present

## 2021-02-17 DIAGNOSIS — E785 Hyperlipidemia, unspecified: Secondary | ICD-10-CM | POA: Diagnosis not present

## 2021-02-17 DIAGNOSIS — I4891 Unspecified atrial fibrillation: Secondary | ICD-10-CM | POA: Diagnosis not present

## 2021-02-17 DIAGNOSIS — D631 Anemia in chronic kidney disease: Secondary | ICD-10-CM | POA: Diagnosis not present

## 2021-02-24 ENCOUNTER — Other Ambulatory Visit: Payer: Self-pay | Admitting: Internal Medicine

## 2021-02-24 DIAGNOSIS — N179 Acute kidney failure, unspecified: Secondary | ICD-10-CM

## 2021-02-26 DIAGNOSIS — L821 Other seborrheic keratosis: Secondary | ICD-10-CM | POA: Diagnosis not present

## 2021-02-26 DIAGNOSIS — D2262 Melanocytic nevi of left upper limb, including shoulder: Secondary | ICD-10-CM | POA: Diagnosis not present

## 2021-02-26 DIAGNOSIS — L814 Other melanin hyperpigmentation: Secondary | ICD-10-CM | POA: Diagnosis not present

## 2021-02-26 DIAGNOSIS — Z85828 Personal history of other malignant neoplasm of skin: Secondary | ICD-10-CM | POA: Diagnosis not present

## 2021-02-26 DIAGNOSIS — Z86006 Personal history of melanoma in-situ: Secondary | ICD-10-CM | POA: Diagnosis not present

## 2021-02-26 DIAGNOSIS — Z79899 Other long term (current) drug therapy: Secondary | ICD-10-CM | POA: Diagnosis not present

## 2021-02-26 DIAGNOSIS — L2089 Other atopic dermatitis: Secondary | ICD-10-CM | POA: Diagnosis not present

## 2021-02-26 DIAGNOSIS — Z86007 Personal history of in-situ neoplasm of skin: Secondary | ICD-10-CM | POA: Diagnosis not present

## 2021-02-26 DIAGNOSIS — D225 Melanocytic nevi of trunk: Secondary | ICD-10-CM | POA: Diagnosis not present

## 2021-02-26 DIAGNOSIS — Z08 Encounter for follow-up examination after completed treatment for malignant neoplasm: Secondary | ICD-10-CM | POA: Diagnosis not present

## 2021-02-26 DIAGNOSIS — L570 Actinic keratosis: Secondary | ICD-10-CM | POA: Diagnosis not present

## 2021-03-13 ENCOUNTER — Ambulatory Visit
Admission: RE | Admit: 2021-03-13 | Discharge: 2021-03-13 | Disposition: A | Discharge status: Home / Self Care | Payer: Medicare Other | Source: Ambulatory Visit | Attending: Internal Medicine | Admitting: Internal Medicine

## 2021-03-13 DIAGNOSIS — N179 Acute kidney failure, unspecified: Secondary | ICD-10-CM

## 2021-03-13 DIAGNOSIS — N200 Calculus of kidney: Secondary | ICD-10-CM | POA: Diagnosis not present

## 2021-03-13 DIAGNOSIS — N1330 Unspecified hydronephrosis: Secondary | ICD-10-CM | POA: Diagnosis not present

## 2021-03-16 ENCOUNTER — Other Ambulatory Visit: Payer: Self-pay | Admitting: Internal Medicine

## 2021-03-16 DIAGNOSIS — N200 Calculus of kidney: Secondary | ICD-10-CM

## 2021-03-19 DIAGNOSIS — N200 Calculus of kidney: Secondary | ICD-10-CM | POA: Diagnosis not present

## 2021-03-24 DIAGNOSIS — N179 Acute kidney failure, unspecified: Secondary | ICD-10-CM | POA: Diagnosis not present

## 2021-04-08 ENCOUNTER — Other Ambulatory Visit: Payer: Medicare Other

## 2021-04-29 DIAGNOSIS — N200 Calculus of kidney: Secondary | ICD-10-CM | POA: Diagnosis not present

## 2021-05-05 ENCOUNTER — Other Ambulatory Visit: Payer: Self-pay | Admitting: Cardiology

## 2021-05-05 DIAGNOSIS — I5032 Chronic diastolic (congestive) heart failure: Secondary | ICD-10-CM

## 2021-05-05 NOTE — Telephone Encounter (Signed)
Can I fill amiodarone?

## 2021-05-07 DIAGNOSIS — N1832 Chronic kidney disease, stage 3b: Secondary | ICD-10-CM | POA: Diagnosis not present

## 2021-05-11 DIAGNOSIS — I4891 Unspecified atrial fibrillation: Secondary | ICD-10-CM | POA: Diagnosis not present

## 2021-05-11 DIAGNOSIS — N200 Calculus of kidney: Secondary | ICD-10-CM | POA: Diagnosis not present

## 2021-05-11 DIAGNOSIS — E785 Hyperlipidemia, unspecified: Secondary | ICD-10-CM | POA: Diagnosis not present

## 2021-05-11 DIAGNOSIS — N184 Chronic kidney disease, stage 4 (severe): Secondary | ICD-10-CM | POA: Diagnosis not present

## 2021-05-11 DIAGNOSIS — I129 Hypertensive chronic kidney disease with stage 1 through stage 4 chronic kidney disease, or unspecified chronic kidney disease: Secondary | ICD-10-CM | POA: Diagnosis not present

## 2021-06-11 ENCOUNTER — Other Ambulatory Visit: Payer: Self-pay | Admitting: Cardiology

## 2021-06-26 ENCOUNTER — Inpatient Hospital Stay (HOSPITAL_COMMUNITY)
Admission: EM | Admit: 2021-06-26 | Discharge: 2021-07-01 | DRG: 683 | Disposition: A | Discharge status: Home Health | Payer: Medicare Other | Attending: Internal Medicine | Admitting: Internal Medicine

## 2021-06-26 ENCOUNTER — Inpatient Hospital Stay (HOSPITAL_COMMUNITY): Payer: Medicare Other

## 2021-06-26 ENCOUNTER — Encounter (HOSPITAL_COMMUNITY): Payer: Self-pay | Admitting: Emergency Medicine

## 2021-06-26 ENCOUNTER — Other Ambulatory Visit: Payer: Self-pay

## 2021-06-26 DIAGNOSIS — Z90710 Acquired absence of both cervix and uterus: Secondary | ICD-10-CM | POA: Diagnosis not present

## 2021-06-26 DIAGNOSIS — E872 Acidosis, unspecified: Secondary | ICD-10-CM | POA: Diagnosis present

## 2021-06-26 DIAGNOSIS — E559 Vitamin D deficiency, unspecified: Secondary | ICD-10-CM | POA: Diagnosis not present

## 2021-06-26 DIAGNOSIS — H409 Unspecified glaucoma: Secondary | ICD-10-CM | POA: Diagnosis present

## 2021-06-26 DIAGNOSIS — I480 Paroxysmal atrial fibrillation: Secondary | ICD-10-CM | POA: Diagnosis not present

## 2021-06-26 DIAGNOSIS — Z888 Allergy status to other drugs, medicaments and biological substances status: Secondary | ICD-10-CM

## 2021-06-26 DIAGNOSIS — Z955 Presence of coronary angioplasty implant and graft: Secondary | ICD-10-CM

## 2021-06-26 DIAGNOSIS — N200 Calculus of kidney: Secondary | ICD-10-CM | POA: Diagnosis not present

## 2021-06-26 DIAGNOSIS — I252 Old myocardial infarction: Secondary | ICD-10-CM

## 2021-06-26 DIAGNOSIS — K5732 Diverticulitis of large intestine without perforation or abscess without bleeding: Secondary | ICD-10-CM | POA: Diagnosis present

## 2021-06-26 DIAGNOSIS — I129 Hypertensive chronic kidney disease with stage 1 through stage 4 chronic kidney disease, or unspecified chronic kidney disease: Secondary | ICD-10-CM | POA: Diagnosis present

## 2021-06-26 DIAGNOSIS — E7800 Pure hypercholesterolemia, unspecified: Secondary | ICD-10-CM | POA: Diagnosis not present

## 2021-06-26 DIAGNOSIS — G319 Degenerative disease of nervous system, unspecified: Secondary | ICD-10-CM | POA: Diagnosis not present

## 2021-06-26 DIAGNOSIS — E119 Type 2 diabetes mellitus without complications: Secondary | ICD-10-CM | POA: Diagnosis not present

## 2021-06-26 DIAGNOSIS — Z9049 Acquired absence of other specified parts of digestive tract: Secondary | ICD-10-CM | POA: Diagnosis not present

## 2021-06-26 DIAGNOSIS — N1832 Chronic kidney disease, stage 3b: Secondary | ICD-10-CM | POA: Diagnosis not present

## 2021-06-26 DIAGNOSIS — R933 Abnormal findings on diagnostic imaging of other parts of digestive tract: Secondary | ICD-10-CM | POA: Diagnosis not present

## 2021-06-26 DIAGNOSIS — K219 Gastro-esophageal reflux disease without esophagitis: Secondary | ICD-10-CM | POA: Diagnosis present

## 2021-06-26 DIAGNOSIS — D649 Anemia, unspecified: Secondary | ICD-10-CM | POA: Diagnosis not present

## 2021-06-26 DIAGNOSIS — R932 Abnormal findings on diagnostic imaging of liver and biliary tract: Secondary | ICD-10-CM | POA: Diagnosis not present

## 2021-06-26 DIAGNOSIS — K8051 Calculus of bile duct without cholangitis or cholecystitis with obstruction: Secondary | ICD-10-CM | POA: Diagnosis present

## 2021-06-26 DIAGNOSIS — K529 Noninfective gastroenteritis and colitis, unspecified: Secondary | ICD-10-CM | POA: Diagnosis not present

## 2021-06-26 DIAGNOSIS — N179 Acute kidney failure, unspecified: Secondary | ICD-10-CM | POA: Diagnosis present

## 2021-06-26 DIAGNOSIS — D509 Iron deficiency anemia, unspecified: Secondary | ICD-10-CM | POA: Diagnosis present

## 2021-06-26 DIAGNOSIS — E876 Hypokalemia: Secondary | ICD-10-CM | POA: Diagnosis not present

## 2021-06-26 DIAGNOSIS — Z79899 Other long term (current) drug therapy: Secondary | ICD-10-CM

## 2021-06-26 DIAGNOSIS — E1122 Type 2 diabetes mellitus with diabetic chronic kidney disease: Secondary | ICD-10-CM | POA: Diagnosis present

## 2021-06-26 DIAGNOSIS — Z66 Do not resuscitate: Secondary | ICD-10-CM | POA: Diagnosis present

## 2021-06-26 DIAGNOSIS — R197 Diarrhea, unspecified: Secondary | ICD-10-CM | POA: Diagnosis present

## 2021-06-26 DIAGNOSIS — I10 Essential (primary) hypertension: Secondary | ICD-10-CM | POA: Diagnosis not present

## 2021-06-26 DIAGNOSIS — Z0000 Encounter for general adult medical examination without abnormal findings: Secondary | ICD-10-CM | POA: Diagnosis not present

## 2021-06-26 DIAGNOSIS — Z882 Allergy status to sulfonamides status: Secondary | ICD-10-CM | POA: Diagnosis not present

## 2021-06-26 DIAGNOSIS — E785 Hyperlipidemia, unspecified: Secondary | ICD-10-CM | POA: Diagnosis present

## 2021-06-26 DIAGNOSIS — K8050 Calculus of bile duct without cholangitis or cholecystitis without obstruction: Secondary | ICD-10-CM | POA: Diagnosis not present

## 2021-06-26 DIAGNOSIS — R1084 Generalized abdominal pain: Secondary | ICD-10-CM | POA: Diagnosis not present

## 2021-06-26 DIAGNOSIS — E1129 Type 2 diabetes mellitus with other diabetic kidney complication: Secondary | ICD-10-CM | POA: Diagnosis not present

## 2021-06-26 DIAGNOSIS — Z7901 Long term (current) use of anticoagulants: Secondary | ICD-10-CM

## 2021-06-26 DIAGNOSIS — K5792 Diverticulitis of intestine, part unspecified, without perforation or abscess without bleeding: Secondary | ICD-10-CM | POA: Diagnosis not present

## 2021-06-26 DIAGNOSIS — K769 Liver disease, unspecified: Secondary | ICD-10-CM | POA: Diagnosis not present

## 2021-06-26 DIAGNOSIS — K838 Other specified diseases of biliary tract: Secondary | ICD-10-CM | POA: Diagnosis not present

## 2021-06-26 DIAGNOSIS — I2510 Atherosclerotic heart disease of native coronary artery without angina pectoris: Secondary | ICD-10-CM | POA: Diagnosis not present

## 2021-06-26 DIAGNOSIS — Z8249 Family history of ischemic heart disease and other diseases of the circulatory system: Secondary | ICD-10-CM

## 2021-06-26 DIAGNOSIS — N261 Atrophy of kidney (terminal): Secondary | ICD-10-CM | POA: Diagnosis not present

## 2021-06-26 DIAGNOSIS — I4891 Unspecified atrial fibrillation: Secondary | ICD-10-CM | POA: Diagnosis not present

## 2021-06-26 DIAGNOSIS — I4821 Permanent atrial fibrillation: Secondary | ICD-10-CM

## 2021-06-26 DIAGNOSIS — D539 Nutritional anemia, unspecified: Secondary | ICD-10-CM | POA: Diagnosis present

## 2021-06-26 LAB — COMPREHENSIVE METABOLIC PANEL
ALT: 11 U/L (ref 0–44)
AST: 10 U/L — ABNORMAL LOW (ref 15–41)
Albumin: 2.7 g/dL — ABNORMAL LOW (ref 3.5–5.0)
Alkaline Phosphatase: 113 U/L (ref 38–126)
Anion gap: 13 (ref 5–15)
BUN: 71 mg/dL — ABNORMAL HIGH (ref 8–23)
CO2: 21 mmol/L — ABNORMAL LOW (ref 22–32)
Calcium: 8.4 mg/dL — ABNORMAL LOW (ref 8.9–10.3)
Chloride: 103 mmol/L (ref 98–111)
Creatinine, Ser: 4.95 mg/dL — ABNORMAL HIGH (ref 0.44–1.00)
GFR, Estimated: 8 mL/min — ABNORMAL LOW (ref >60)
Glucose, Bld: 130 mg/dL — ABNORMAL HIGH (ref 70–99)
Potassium: 3.4 mmol/L — ABNORMAL LOW (ref 3.5–5.1)
Sodium: 137 mmol/L (ref 135–145)
Total Bilirubin: 1.3 mg/dL — ABNORMAL HIGH (ref 0.3–1.2)
Total Protein: 6.3 g/dL — ABNORMAL LOW (ref 6.5–8.1)

## 2021-06-26 LAB — CBC WITH DIFFERENTIAL/PLATELET
Abs Immature Granulocytes: 0.07 10*3/uL (ref 0.00–0.07)
Basophils Absolute: 0 10*3/uL (ref 0.0–0.1)
Basophils Relative: 0 %
Eosinophils Absolute: 0 10*3/uL (ref 0.0–0.5)
Eosinophils Relative: 0 %
HCT: 37.8 % (ref 36.0–46.0)
Hemoglobin: 12.4 g/dL (ref 12.0–15.0)
Immature Granulocytes: 1 %
Lymphocytes Relative: 14 %
Lymphs Abs: 1.6 10*3/uL (ref 0.7–4.0)
MCH: 34.3 pg — ABNORMAL HIGH (ref 26.0–34.0)
MCHC: 32.8 g/dL (ref 30.0–36.0)
MCV: 104.4 fL — ABNORMAL HIGH (ref 80.0–100.0)
Monocytes Absolute: 0.7 10*3/uL (ref 0.1–1.0)
Monocytes Relative: 6 %
Neutro Abs: 9.2 10*3/uL — ABNORMAL HIGH (ref 1.7–7.7)
Neutrophils Relative %: 79 %
Platelets: 191 10*3/uL (ref 150–400)
RBC: 3.62 MIL/uL — ABNORMAL LOW (ref 3.87–5.11)
RDW: 13.3 % (ref 11.5–15.5)
WBC: 11.5 10*3/uL — ABNORMAL HIGH (ref 4.0–10.5)
nRBC: 0 % (ref 0.0–0.2)

## 2021-06-26 LAB — PHOSPHORUS: Phosphorus: 4.3 mg/dL (ref 2.5–4.6)

## 2021-06-26 LAB — MAGNESIUM: Magnesium: 1.9 mg/dL (ref 1.7–2.4)

## 2021-06-26 MED ORDER — APIXABAN 2.5 MG PO TABS
2.5000 mg | ORAL_TABLET | Freq: Two times a day (BID) | ORAL | Status: DC
Start: 2021-06-27 — End: 2021-06-26

## 2021-06-26 MED ORDER — SODIUM CHLORIDE 0.9 % IV BOLUS
500.0000 mL | Freq: Once | INTRAVENOUS | Status: AC
  Administered 2021-06-26: 500 mL via INTRAVENOUS

## 2021-06-26 MED ORDER — ONDANSETRON HCL 4 MG PO TABS: 4.0000 mg | ORAL_TABLET | Freq: Four times a day (QID) | ORAL | Status: DC | PRN

## 2021-06-26 MED ORDER — ACETAMINOPHEN 650 MG RE SUPP: 650.0000 mg | Freq: Four times a day (QID) | RECTAL | Status: DC | PRN

## 2021-06-26 MED ORDER — ACETAMINOPHEN 325 MG PO TABS: 650.0000 mg | ORAL_TABLET | Freq: Four times a day (QID) | ORAL | Status: DC | PRN

## 2021-06-26 MED ORDER — SODIUM CHLORIDE 0.9 % IV SOLN: INTRAVENOUS | Status: AC

## 2021-06-26 MED ORDER — MELATONIN 5 MG PO TABS
10.0000 mg | ORAL_TABLET | Freq: Every evening | ORAL | Status: DC | PRN
Start: 2021-06-26 — End: 2021-07-01
  Administered 2021-06-27 – 2021-06-29 (×3): 10 mg via ORAL
  Filled 2021-06-26 (×3): qty 2

## 2021-06-26 MED ORDER — ONDANSETRON HCL 4 MG/2ML IJ SOLN
4.0000 mg | Freq: Four times a day (QID) | INTRAMUSCULAR | Status: DC | PRN
Start: 2021-06-26 — End: 2021-07-01

## 2021-06-26 MED ORDER — METRONIDAZOLE 500 MG/100ML IV SOLN
500.0000 mg | Freq: Two times a day (BID) | INTRAVENOUS | Status: DC
  Administered 2021-06-26 – 2021-06-27 (×3): 500 mg via INTRAVENOUS
  Filled 2021-06-26 (×4): qty 100

## 2021-06-26 MED ORDER — SODIUM CHLORIDE 0.9 % IV SOLN
2.0000 g | INTRAVENOUS | Status: AC
  Administered 2021-06-26 – 2021-06-30 (×5): 2 g via INTRAVENOUS
  Filled 2021-06-26 (×5): qty 20

## 2021-06-26 NOTE — ED Provider Notes (Signed)
? ?Emergency Department Provider Note ? ? ?I have reviewed the triage vital signs and the nursing notes. ? ? ?HISTORY ? ?Chief Complaint ?Abnormal Lab ? ? ?HPI ?[name] is a 88 y.o. female with past medical history reviewed below including diabetes, hypertension, chronic kidney disease presents to the emergency department with worsening kidney function.  Patient tells me that she is being followed by her primary care doctor as well as [facility].  She has been feeling bad for the past 3 weeks.  She has diarrhea several times per week but in the past couple of weeks has had vomiting once to twice per week.  No blood in the emesis or stool.  No fevers.  No abdominal or flank pain.  No dysuria, hesitancy, urgency.  She continues to drink fluids without difficulty.  No changes to medications.  She had blood work drawn at her PCP office and creatinine came back elevated.  She was referred to the emergency department for evaluation. ? ? ?Past Medical History:  ?Diagnosis Date  ? Acalculous cholecystitis   ? Arthritis   ? Blood transfusion   ? Blood transfusion without reported diagnosis   ? Cataract   ? bilateral-removed from both eyes  ? Coronary artery disease   ? mi  7/12,  stress test 12/21/2011=>negative for ischemia [name])  ? Diabetes mellitus   ? pt states she is not diabetic  ? Diverticulitis   ? Dizzy   ? Dyslipidemia   ? Fatigue   ? GERD (gastroesophageal reflux disease)   ? Glaucoma   ? Hypertension   ? Hypokalemia   ? 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 Mitral regurgitation   ? mild to moderate, cardiac echo 12/02/2011, EF 51% [name])  ? Myocardial infarction [facility])   ? 7/12  ? PONV (postoperative nausea and vomiting)   ? yrs ago  ? Shortness of breath   ? ? ?Review of Systems ? ?Constitutional: No fever/chills ?Eyes: No visual changes. ?ENT: No sore throat. ?Cardiovascular: Denies chest pain. ?Respiratory: Denies shortness  of breath. ?Gastrointestinal: No abdominal pain.  Occasional nausea, vomiting, and diarrhea.  No constipation. ?Genitourinary: Negative for dysuria. ?Musculoskeletal: Negative for back pain. ?Skin: Negative for rash. ?Neurological: Negative for headaches. ? ? ?____________________________________________ ? ? ?PHYSICAL EXAM: ? ?VITAL SIGNS: ?ED Triage Vitals  ?Enc Vitals Group  ?   BP 06/26/21 1436 97/70  ?   Pulse Rate 06/26/21 1436 90  ?   Resp 06/26/21 1436 18  ?   Temp 06/26/21 1436 97.8 ?F (36.6 ?C)  ?   Temp Source 06/26/21 1436 Oral  ?   SpO2 06/26/21 1436 98 %  ? ?Constitutional: Alert and oriented. Well appearing and in no acute distress. ?Eyes: Conjunctivae are normal.  ?Head: Atraumatic. ?Nose: No congestion/rhinnorhea. ?Mouth/Throat: Mucous membranes are slightly dry.  ?Neck: No stridor.   ?Cardiovascular: Normal rate, regular rhythm. Good peripheral circulation. Grossly normal heart sounds.   ?Respiratory: Normal respiratory effort.  No retractions. Lungs CTAB. ?Gastrointestinal: Soft and nontender. No distention.  ?Musculoskeletal: No lower extremity tenderness nor edema. No gross deformities of extremities. ?Neurologic:  Normal speech and language. No gross focal neurologic deficits are appreciated.  ?Skin:  Skin is warm, dry and intact. No rash noted. ? ?____________________________________________ ?  ?LABS ?(all labs ordered are listed, but only abnormal results are displayed) ? ?Labs Reviewed  ?COMPREHENSIVE METABOLIC PANEL - Abnormal; Notable for the following components:  ?  Result Value  ? Potassium 3.4 (*)   ? CO2 21 (*)   ? Glucose, Bld 130 (*)   ? BUN 71 (*)   ? Creatinine, Ser 4.95 (*)   ? Calcium 8.4 (*)   ? Total Protein 6.3 (*)   ? Albumin 2.7 (*)   ? AST 10 (*)   ? Total Bilirubin 1.3 (*)   ? GFR, Estimated 8 (*)   ? All other components within normal limits  ?CBC WITH DIFFERENTIAL/PLATELET - Abnormal; Notable for the following components:  ? WBC 11.5 (*)   ? RBC 3.62 (*)   ? MCV 104.4  (*)   ? MCH 34.3 (*)   ? Neutro Abs 9.2 (*)   ? All other components within normal limits  ?MAGNESIUM  ?PHOSPHORUS  ?URINALYSIS, ROUTINE W REFLEX MICROSCOPIC  ? ? ?____________________________________________ ? ? ?PROCEDURES ? ?Procedure(s) performed:  ? ?Procedures ? ?None  ?____________________________________________ ? ? ?INITIAL IMPRESSION / ASSESSMENT AND PLAN / ED COURSE ? ?Pertinent labs & imaging results that were available during my care of the patient were reviewed by me and considered in my medical decision making (see chart for details). ?  ?This patient is Presenting for Evaluation of weakness, which does require a range of treatment options, and is a complaint that involves a high risk of morbidity and mortality. ? ?The Differential Diagnoses include AKI, developing infection, arrhythmia, ACS. ? ?Critical Interventions-  ?  ?Medications  ?sodium chloride 0.9 % bolus 500 mL (500 mLs Intravenous New Bag/Given 06/26/21 1822)  ? ? ?Reassessment after intervention:  Looking well. BP improved.  ? ? ?I did obtain Additional Historical Information from son at bedside. ? ?I decided to review pertinent External Data, and in summary Cardiology notes in our system. No recent nephrology notes. No recent ED visits. ?  ?Clinical Laboratory Tests Ordered, included CBC showing mild leukocytosis with no anemia.  Creatinine of 4.95.  BUN of 71.  Electrolytes are largely unremarkable.  ? ?Radiologic Tests: No pain in the abdomen, flank, back to suggest obstructive etiology.  Patient will likely need renal ultrasound but will defer in the emergency setting.  ? ?Cardiac Monitor Tracing which shows NSR. ? ? ?Social Determinants of Health Risk patient is a non-smoker.  ? ?Consult complete with ? ?Nephrology. Dr. Joelyn Oms. They will consult during the inpatient stay. ? ?Hospitalist. Dr. Bridgett Larsson. Plan for admit to medicine service.  ? ?Medical Decision Making: Summary:  ?Patient presents to the emergency department with worsening  kidney function compared to baseline values.  She is not having back or flank pain to suspect obstructive process.  Lab work here shows acute kidney injury.  Does not appear fluid overloaded.  No history of attempted aggressive diuresis.  Plan to discuss with nephrology and hospitalist.  ? ?Reevaluation with update and discussion with patient and family. Plan for admit. They are in agreement.  ? ?Disposition: admit ? ?____________________________________________ ? ?FINAL CLINICAL IMPRESSION(S) / ED DIAGNOSES ? ?Final diagnoses:  ?AKI (acute kidney injury) ([place])  ? ? ?Note:  This document was prepared using Dragon voice recognition software and may include unintentional dictation errors. ? ?Nanda Quinton, MD, FACEP ?Emergency Medicine ? ?  ?Margette Fast, MD ?06/26/21 [phone] ? ?

## 2021-06-26 NOTE — Assessment & Plan Note (Signed)
Patient states that she has had chronic diarrhea.  This is not new. ?

## 2021-06-26 NOTE — Progress Notes (Addendum)
CT urogram stone showed choledocholithiasis and sigmoid diverticulitis.  No ureteral stone. ? ?Will need to hold anticoagulation. NPO after MN. Will need GI to see patient.  Pt has been seen by [facility] in the past.  Pt is s/p cholecystectomy. Interestingly, pt has no LFT elevation. Will check RUQ U/S as recommended by radiology to further evaluate choledocholithiasis...may ultimately need MRCP. ? ?As far as diverticulitis, start rocephin/flagyl. ? ? ? ?CT RENAL STONE STUDY ? ?Result Date: 06/26/2021 ?CLINICAL DATA:  Acute renal failure, history of kidney stones. EXAM: CT ABDOMEN AND PELVIS WITHOUT CONTRAST TECHNIQUE: Multidetector CT imaging of the abdomen and pelvis was performed following the standard protocol without IV contrast. RADIATION DOSE REDUCTION: This exam was performed according to the departmental dose-optimization program which includes automated exposure control, adjustment of the mA and/or kV according to patient size and/or use of iterative reconstruction technique. COMPARISON:  10/27/2017. FINDINGS: Lower chest: The heart is normal in size and coronary artery calcifications are noted. Mild atelectasis is present at the lung bases. There is a stable subpleural ground-glass nodule measuring 2 mm in the right middle lobe, axial image 3. Hepatobiliary: A vague hypoenhancing lesions are present in the left lobe of the liver measuring 1.8 cm, hepatic segment 2 and 1.1 cm in the right lobe of the liver, hepatic segment 7, axial image 18. No biliary ductal dilatation. The gallbladder is surgically absent. The common bile duct measures 1.1 cm, not significantly changed from the prior exam. Multiple stones are seen in the region of the mid to distal common bile duct, compatible with choledocholithiasis. Pancreas: Unremarkable. No pancreatic ductal dilatation or surrounding inflammatory changes. Spleen: Normal in size without focal abnormality. Adrenals/Urinary Tract: The adrenal glands are within normal  limits. Small calculi are noted in the kidneys bilaterally. No ureteral calculus or obstructive uropathy bilaterally. The bladder is unremarkable. Stomach/Bowel: The stomach is within normal limits. No bowel obstruction, free air, or pneumatosis. Multiple scattered diverticula are noted along the colon. There is bowel wall thickening and fat stranding at the junction of the rectosigmoid colon with surrounding fat stranding, compatible with diverticulitis. No abscess is identified. A normal appendix is seen in the right lower quadrant. Vascular/Lymphatic: Aortic atherosclerosis. No enlarged abdominal or pelvic lymph nodes. Reproductive: Status post hysterectomy. No adnexal masses. Other: No abdominal wall hernia or abnormality. No abdominopelvic ascites. Musculoskeletal: Degenerative changes in the thoracolumbar spine. No acute osseous abnormality is seen. IMPRESSION: 1. Bilateral nephrolithiasis with no evidence of obstructive uropathy. 2. Diverticulitis at the rectosigmoid junction. No abscess or free air is identified. 3. Status post cholecystectomy. Mildly distended common bile duct with choledocholithiasis. Ultrasound is recommended for further evaluation. 4. A stable hypodensities in the liver, unchanged from 2019. MRI may be beneficial for further characterization. 5. Aortic atherosclerosis and coronary artery calcification. Electronically Signed   By: Brett Fairy M.D.   On: 06/26/2021 20:28   ? ?

## 2021-06-26 NOTE — Assessment & Plan Note (Signed)
Admit to inpatient telemetry bed.  We will check CT urogram to rule out obstructing kidney stone.  EDP is contacted nephrology.  We will start on IV fluids.  Hold nephrotoxic agents.  Hold diuretics. ?

## 2021-06-26 NOTE — ED Notes (Signed)
Patient transported to CT 

## 2021-06-26 NOTE — Assessment & Plan Note (Signed)
Continue statin. 

## 2021-06-26 NOTE — Assessment & Plan Note (Signed)
Stable. No chest pain.

## 2021-06-26 NOTE — ED Provider Triage Note (Signed)
Emergency Medicine Provider Triage Evaluation Note ? ?[name] , a 88 y.o. female  was evaluated in triage.  Pt sent to ED by PCP due to abnormal lab.  Patient reports that she has had increased creatinine and BUN, has been sent here by her PCP.  Patient reports that she has history of kidney failure, called her nephrologist on the way and he agreed with being sent to ED for further management.  Patient endorsing weakness, lightheadedness. ? ?Review of Systems  ?Positive: Weakness, lightheadedness, nausea and vomiting ?Negative: Chest pain, shortness of breath, decreased urination, difficulty urinating ? ?Physical Exam  ?BP 97/70 (BP Location: Left Arm)   Pulse 90   Temp 97.8 ?F (36.6 ?C) (Oral)   Resp 18   SpO2 98%  ?Gen:   Awake, no distress   ?Resp:  Normal effort  ?MSK:   Moves extremities without difficulty  ?Other:  1+ pitting edema bilaterally to lower extremities.  Lungs clear. ? ?Medical Decision Making  ?Medically screening exam initiated at 2:47 PM.  Appropriate orders placed.  [name] was informed that the remainder of the evaluation will be completed by another provider, this initial triage assessment does not replace that evaluation, and the importance of remaining in the ED until their evaluation is complete. ? ? ?  ?Azucena Cecil, PA-C ?06/26/21 1448 ? ?

## 2021-06-26 NOTE — Subjective & Objective (Signed)
CC: abnormal labs ?HPI: ?88 year old white female history of diabetes, history of A-fib on chronic anticoagulation with Xarelto, history of chronic kidney disease stage IIIb followed by [facility], coronary disease, hyperlipidemia presents to the hospital today from the PCP office.  She was going there Today for lab work for an upcoming Routine appointment at the end of the week.  She was called back with abnormal labs.  Serum creatinine is increased to over 5. ? ?Patient states over the last 3 weeks, she has been feeling increasingly fatigued.  She has dyspnea on exertion. She states she can only walk about 50 feet before getting extremely short of breath ? ?Patient's had intermittent diarrhea..  She states that this is not unusual for her.  Denies any fever or chills.  Denies any chest pain. ? ?Patient states that she has had an obstructing kidney stone in the past.  She is seeing by [facility].  She states that she never had any pain with her kidney stone.  She states that when she went back to see urology, she was told that she may have passed a kidney stone without surgery. ? ? ?Renal ultrasound in December 2022 demonstrated bilateral renal stones.  These were nonobstructing at that time. ? ?Due to patient's elevated serum creatinine, [facility] hospitalist contacted for admission. ?

## 2021-06-26 NOTE — H&P (Signed)
?History and Physical  ? ? ?BENICIA BERGEVIN BMW:[id number] DOB: 1933-10-04 DOA: 06/26/2021 ? ?DOS: the patient was seen and examined on 06/26/2021 ? ?PCP: Holland Commons, FNP  ? ?Patient coming from: Home ? ?I have personally briefly reviewed patient's old medical records in [place] ? ?CC: abnormal labs ?HPI: ?88 year old white female history of diabetes, history of A-fib on chronic anticoagulation with Xarelto, history of chronic kidney disease stage IIIb followed by [facility], coronary disease, hyperlipidemia presents to the hospital today from the PCP office.  She was going there Today for lab work for an upcoming Routine appointment at the end of the week.  She was called back with abnormal labs.  Serum creatinine is increased to over 5. ? ?Patient states over the last 3 weeks, she has been feeling increasingly fatigued.  She has dyspnea on exertion. She states she can only walk about 50 feet before getting extremely short of breath ? ?Patient's had intermittent diarrhea..  She states that this is not unusual for her.  Denies any fever or chills.  Denies any chest pain. ? ?Patient states that she has had an obstructing kidney stone in the past.  She is seeing by [facility].  She states that she never had any pain with her kidney stone.  She states that when she went back to see urology, she was told that she may have passed a kidney stone without surgery. ? ? ?Renal ultrasound in December 2022 demonstrated bilateral renal stones.  These were nonobstructing at that time. ? ?Due to patient's elevated serum creatinine, [facility] hospitalist contacted for admission.  ? ?ED Course: labs show worsening Scr. EDP has notified nephrology of pt's admission ? ?Review of Systems:  ?Review of Systems  ?Constitutional:  Positive for malaise/fatigue.  ?HENT: Negative.    ?Eyes: Negative.   ?Respiratory:  Positive for shortness of breath.   ?     DOE with 50 feet  ?Cardiovascular: Negative.    ?Gastrointestinal:  Positive for diarrhea and nausea.  ?Genitourinary:  Negative for dysuria, frequency and urgency.  ?Musculoskeletal: Negative.   ?Skin: Negative.   ?Neurological:  Positive for dizziness.  ?Endo/Heme/Allergies: Negative.   ?Psychiatric/Behavioral: Negative.    ?All other systems reviewed and are negative. ? ?Past Medical History:  ?Diagnosis Date  ? Acalculous cholecystitis   ? Arthritis   ? Blood transfusion   ? Blood transfusion without reported diagnosis   ? Cataract   ? bilateral-removed from both eyes  ? Coronary artery disease   ? mi  7/12,  stress test 12/21/2011=>negative for ischemia [name])  ? Diabetes mellitus   ? pt states she is not diabetic  ? Diverticulitis   ? Dizzy   ? Dyslipidemia   ? Fatigue   ? GERD (gastroesophageal reflux disease)   ? Glaucoma   ? Hypertension   ? Hypokalemia   ? 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 Mitral regurgitation   ? mild to moderate, cardiac echo 12/02/2011, EF 51% [name])  ? Myocardial infarction [facility])   ? 7/12  ? PONV (postoperative nausea and vomiting)   ? yrs ago  ? Shortness of breath   ? ? ?Past Surgical History:  ?Procedure Laterality Date  ? ABDOMINAL HYSTERECTOMY    ? BILIARY DRAINAGE  9/12  ? BIOPSY  11/04/2017  ? Procedure: BIOPSY;  Surgeon: Jackquline Denmark, MD;  Location: [facility] ENDOSCOPY;  Service: Endoscopy;;  ? CARDIOVERSION N/A 02/12/2015  ?  Procedure: CARDIOVERSION;  Surgeon: [name], MD;  Location: [facility];  Service: Cardiovascular;  Laterality: N/A;  ? CARDIOVERSION N/A 11/10/2017  ? Procedure: CARDIOVERSION;  Surgeon: Nigel Mormon, MD;  Location: [facility] ENDOSCOPY;  Service: Cardiovascular;  Laterality: N/A;  ? CATARACT EXTRACTION    ? 2  ? CHOLECYSTECTOMY  02/25/2011  ? Procedure: LAPAROSCOPIC CHOLECYSTECTOMY WITH INTRAOPERATIVE CHOLANGIOGRAM;  Surgeon: Gayland Curry, MD;  Location: [facility];  Service: General;  Laterality: N/A;  ? CORONARY ANGIOPLASTY WITH STENT PLACEMENT   09/24/10  ? drug eluting stents  ? ESOPHAGOGASTRODUODENOSCOPY (EGD) WITH PROPOFOL N/A 11/04/2017  ? Procedure: ESOPHAGOGASTRODUODENOSCOPY (EGD) WITH PROPOFOL;  Surgeon: Jackquline Denmark, MD;  Location: [facility] ENDOSCOPY;  Service: Endoscopy;  Laterality: N/A;  ? GIVENS CAPSULE STUDY N/A 01/22/2018  ? Procedure: GIVENS CAPSULE STUDY;  Surgeon: Gatha Mayer, MD;  Location: [facility] ENDOSCOPY;  Service: Endoscopy;  Laterality: N/A;  ? PARATHYROIDECTOMY    ? tumor excision  ? SHOULDER SURGERY    ? right  ? SIGMOIDOSCOPY    ? flex sig in the 80's per pt.  ? ? ? reports that she has never smoked. She has never used smokeless tobacco. She reports current alcohol use. She reports that she does not use drugs. ? ?Allergies  ?Allergen Reactions  ? Dilaudid [Hydromorphone Hcl] Other (See Comments)  ?  Caused patient psychological disturbances   ? Losartan Other (See Comments)  ?  Per MAR  ? Bactrim [Sulfamethoxazole-Trimethoprim] Rash  ? Elemental Sulfur Rash  ? Pantoprazole Diarrhea  ?  Gave her gas and diarrhea  ? ? ?Family History  ?Problem Relation Age of Onset  ? Heart disease Father   ? Heart disease Brother   ? Melanoma Son   ? Cancer Maternal Grandfather   ? Colon cancer Neg Hx   ? Rectal cancer Neg Hx   ? Stomach cancer Neg Hx   ? ? ?Prior to Admission medications   ?Medication Sig Start Date End Date Taking? Authorizing Provider  ?amiodarone (PACERONE) 200 MG tablet Take 1/2 (one-half) tablet by mouth once daily 05/05/21   [name], MD  ?betamethasone dipropionate (DIPROLENE) 0.05 % cream Apply topically 2 (two) times daily.    [provider]  ?dorzolamide (TRUSOPT) 2 % ophthalmic solution Place 1 drop into the left eye 2 (two) times daily. (0800 & 1900)    [provider]  ?DUPIXENT 300 MG/2ML SOPN SMARTSIG:1 Pre-Filled Pen Syringe SUB-Q Every 2 Weeks 09/13/19   [provider]  ?ferrous sulfate 325 (65 FE) MG EC tablet Take 1 tablet (325 mg total) by mouth 2 (two) times daily. 05/16/18 11/05/19  Willia Craze, NP  ?ketoconazole (NIZORAL) 2 % cream SMARTSIG:Sparingly Topical Twice Daily PRN 10/17/19   [provider]  ?latanoprost (XALATAN) 0.005 % ophthalmic solution Place 1 drop into the left eye at bedtime. (1900) 07/28/17   [provider]  ?methocarbamol (ROBAXIN) 500 MG tablet Take 500 mg by mouth 4 (four) times daily as needed (pain).     [provider]  ?metoprolol tartrate (LOPRESSOR) 25 MG tablet Take 1 tablet (25 mg total) by mouth 2 (two) times daily. 05/05/20   [name], MD  ?Multiple Vitamins-Minerals (PRESERVISION AREDS 2) CAPS Take 1 capsule by mouth 2 (two) times daily.    [provider]  ?nitroGLYCERIN (NITROSTAT) 0.4 MG SL tablet DISSOLVE ONE TABLET UNDER THE TONGUE EVERY 5 MINUTES AS NEEDED FOR CHEST PAIN.  DO NOT EXCEED A TOTAL OF 3 DOSES IN 15  MINUTES NOW 03/05/20   [name], MD  ?potassium chloride (KLOR-CON) 10 MEQ tablet TAKE 1 TABLET BY MOUTH ONCE DAILY WITH TORESEMIDE 06/11/21   [name], MD  ?Propylene Glycol (SYSTANE BALANCE OP) Apply 1 drop to eye 4 (four) times daily as needed (dry eyes).     [provider]  ?Rivaroxaban (XARELTO) 15 MG TABS tablet TAKE 1 TABLET BY MOUTH ONCE DAILY AFTER SUPPER 05/05/20   [name], MD  ?rosuvastatin (CRESTOR) 5 MG tablet Take 5 mg by mouth at bedtime. (2000)    [provider]  ?torsemide (DEMADEX) 20 MG tablet TAKE 1 TABLET BY MOUTH TWICE DAILY IN THE MORNING AND AT 2 PM FOR 3 POUND WEIGHT GAIN OR EDEMA OTHERWISE TAKE DAILY. 05/05/21   [name], MD  ?triamcinolone cream (KENALOG) 0.1 %  10/24/19   [provider]  ?vitamin B-12 (CYANOCOBALAMIN) 500 MCG tablet Take 1 tablet (500 mcg total) by mouth daily. 01/23/18   Elgergawy, Silver Huguenin, MD  ? ? ?Physical Exam: ?Vitals:  ? 06/26/21 1800 06/26/21 1805 06/26/21 1830 06/26/21 1900  ?BP: 126/79  123/74 130/69  ?Pulse:  80 79 77  ?Resp: (!) '29 15 20 15  '$ ?Temp:      ?TempSrc:      ?SpO2:  100% 100% 100%  ? ? ?Physical Exam ?Vitals and nursing  note reviewed.  ?Constitutional:   ?   General: She is not in acute distress. ?   Appearance: Normal appearance. She is not ill-appearing, toxic-appearing or diaphoretic.  ?HENT:  ?   Head: Normocephalic and atraumatic

## 2021-06-26 NOTE — Assessment & Plan Note (Signed)
Acutely worsened.  Baseline creatinine according to nephrology is approximately 1.5-2.0. ?

## 2021-06-26 NOTE — Assessment & Plan Note (Signed)
-  Stable overall -Continue current antihypertensive agents (Lopressor and Lasix). -Heart healthy/low-sodium diet discussed with patient. 

## 2021-06-26 NOTE — ED Triage Notes (Signed)
Patient to ED from nephrologist for evaluation of creatinine of 5 and BUN of 70. Patient is not on dialysis. Patient is alert, oriented, and in no apparent distress at this time. Patient reports feeling unwell for the last several weeks and having intermittent vomiting.  ?

## 2021-06-26 NOTE — Plan of Care (Signed)

## 2021-06-26 NOTE — Assessment & Plan Note (Signed)
Check EKG.  Continue Lopressor.  Continue amiodarone.  We will need to hold Xarelto for now due to decreased serum creatinine.  She may need to be switched to Eliquis. ?

## 2021-06-26 NOTE — Progress Notes (Addendum)
ANTICOAGULATION CONSULT NOTE - Initial Consult ? ?Pharmacy Consult for Eliquis ?Indication: atrial fibrillation ? ?Allergies  ?Allergen Reactions  ? Dilaudid [Hydromorphone Hcl] Other (See Comments)  ?  Caused patient psychological disturbances   ? Doxycycline Hyclate Other (See Comments)  ?  diarrhea  ? Losartan Other (See Comments)  ?  Per MAR  ? Bactrim [Sulfamethoxazole-Trimethoprim] Rash  ? Elemental Sulfur Rash  ? Pantoprazole Diarrhea  ?  Gave her gas and diarrhea  ? ? ?Patient Measurements: ?  ? ?Vital Signs: ?Temp: 97.6 ?F (36.4 ?C) (03/31 1721) ?Temp Source: Oral (03/31 1436) ?BP: 130/69 (03/31 1900) ?Pulse Rate: 77 (03/31 1900) ? ?Labs: ?Recent Labs  ?  06/26/21 ?1452  ?HGB 12.4  ?HCT 37.8  ?PLT 191  ?CREATININE 4.95*  ? ? ?CrCl cannot be calculated (Unknown ideal weight.). ? ? ?Medical History: ?Past Medical History:  ?Diagnosis Date  ? Acalculous cholecystitis   ? Arthritis   ? Blood transfusion   ? Blood transfusion without reported diagnosis   ? Cataract   ? bilateral-removed from both eyes  ? Coronary artery disease   ? mi  7/12,  stress test 12/21/2011=>negative for ischemia [name])  ? Diabetes mellitus   ? pt states she is not diabetic  ? Diverticulitis   ? Dizzy   ? Dyslipidemia   ? Fatigue   ? GERD (gastroesophageal reflux disease)   ? Glaucoma   ? Hypertension   ? Hypokalemia   ? 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 Mitral regurgitation   ? mild to moderate, cardiac echo 12/02/2011, EF 51% [name])  ? Myocardial infarction [facility])   ? 7/12  ? PONV (postoperative nausea and vomiting)   ? yrs ago  ? Shortness of breath   ? ? ?Assessment: ?53 YOF presenting from clinic with elevated SCr, hx of afib on Xarelto PTA reduced dose '15mg'$  daily, last dose 3/30 '@1700'$ .  Discussed switch to Eliquis while in AKI with SCr of 4.95.  Reduced dose Eliquis with Age 87y, SCr 4.95 ? ?Goal of Therapy:  ?Monitor platelets by anticoagulation protocol: Yes ?  ?Plan:   ?Eliquis 2.'5mg'$  PO BID start 4/1 AM ?Monitor renal function, s/s bleeding ?F/u renal recovery and long term AC plan vs. Switching back to reduced dose Xarelto ? ?Addendum: ?Will now likely need intervention, to hold [facility] and await GI recs ?F/u need for [facility] bridge with heparin in AM ? ?Bertis Ruddy, PharmD ?Clinical Pharmacist ?ED Pharmacist Phone # [phone] ?06/26/2021 8:16 PM ? ? ? ?

## 2021-06-27 ENCOUNTER — Inpatient Hospital Stay (HOSPITAL_COMMUNITY): Payer: Medicare Other

## 2021-06-27 DIAGNOSIS — N179 Acute kidney failure, unspecified: Secondary | ICD-10-CM

## 2021-06-27 DIAGNOSIS — N1832 Chronic kidney disease, stage 3b: Secondary | ICD-10-CM | POA: Diagnosis not present

## 2021-06-27 LAB — CBC WITH DIFFERENTIAL/PLATELET
Abs Immature Granulocytes: 0.03 10*3/uL (ref 0.00–0.07)
Basophils Absolute: 0 10*3/uL (ref 0.0–0.1)
Basophils Relative: 1 %
Eosinophils Absolute: 0.1 10*3/uL (ref 0.0–0.5)
Eosinophils Relative: 1 %
HCT: 29.7 % — ABNORMAL LOW (ref 36.0–46.0)
Hemoglobin: 9.8 g/dL — ABNORMAL LOW (ref 12.0–15.0)
Immature Granulocytes: 0 %
Lymphocytes Relative: 22 %
Lymphs Abs: 1.9 10*3/uL (ref 0.7–4.0)
MCH: 34.4 pg — ABNORMAL HIGH (ref 26.0–34.0)
MCHC: 33 g/dL (ref 30.0–36.0)
MCV: 104.2 fL — ABNORMAL HIGH (ref 80.0–100.0)
Monocytes Absolute: 0.7 10*3/uL (ref 0.1–1.0)
Monocytes Relative: 8 %
Neutro Abs: 5.9 10*3/uL (ref 1.7–7.7)
Neutrophils Relative %: 68 %
Platelets: 138 10*3/uL — ABNORMAL LOW (ref 150–400)
RBC: 2.85 MIL/uL — ABNORMAL LOW (ref 3.87–5.11)
RDW: 13.4 % (ref 11.5–15.5)
WBC: 8.7 10*3/uL (ref 4.0–10.5)
nRBC: 0 % (ref 0.0–0.2)

## 2021-06-27 LAB — MAGNESIUM: Magnesium: 1.8 mg/dL (ref 1.7–2.4)

## 2021-06-27 LAB — COMPREHENSIVE METABOLIC PANEL
ALT: 8 U/L (ref 0–44)
AST: 6 U/L — ABNORMAL LOW (ref 15–41)
Albumin: 2.1 g/dL — ABNORMAL LOW (ref 3.5–5.0)
Alkaline Phosphatase: 85 U/L (ref 38–126)
Anion gap: 9 (ref 5–15)
BUN: 66 mg/dL — ABNORMAL HIGH (ref 8–23)
CO2: 20 mmol/L — ABNORMAL LOW (ref 22–32)
Calcium: 7.7 mg/dL — ABNORMAL LOW (ref 8.9–10.3)
Chloride: 108 mmol/L (ref 98–111)
Creatinine, Ser: 4.58 mg/dL — ABNORMAL HIGH (ref 0.44–1.00)
GFR, Estimated: 9 mL/min — ABNORMAL LOW (ref >60)
Glucose, Bld: 98 mg/dL (ref 70–99)
Potassium: 2.9 mmol/L — ABNORMAL LOW (ref 3.5–5.1)
Sodium: 137 mmol/L (ref 135–145)
Total Bilirubin: 0.5 mg/dL (ref 0.3–1.2)
Total Protein: 5 g/dL — ABNORMAL LOW (ref 6.5–8.1)

## 2021-06-27 LAB — VITAMIN B12: Vitamin B-12: 3267 pg/mL — ABNORMAL HIGH (ref 180–914)

## 2021-06-27 LAB — APTT: aPTT: 28 seconds (ref 24–36)

## 2021-06-27 LAB — HEPARIN LEVEL (UNFRACTIONATED): Heparin Unfractionated: <0.1 IU/mL — ABNORMAL LOW (ref 0.30–0.70)

## 2021-06-27 LAB — FOLATE: Folate: 5.1 ng/mL — ABNORMAL LOW (ref >5.9)

## 2021-06-27 MED ORDER — AMIODARONE HCL 200 MG PO TABS
100.0000 mg | ORAL_TABLET | Freq: Every day | ORAL | Status: DC
  Administered 2021-06-27 – 2021-07-01 (×5): 100 mg via ORAL
  Filled 2021-06-27 (×5): qty 1

## 2021-06-27 MED ORDER — HEPARIN BOLUS VIA INFUSION
2500.0000 [IU] | Freq: Once | INTRAVENOUS | Status: AC
  Administered 2021-06-27: 2500 [IU] via INTRAVENOUS
  Filled 2021-06-27: qty 2500

## 2021-06-27 MED ORDER — SODIUM CHLORIDE 0.9 % IV SOLN: INTRAVENOUS | Status: DC

## 2021-06-27 MED ORDER — LACTATED RINGERS IV SOLN: INTRAVENOUS | Status: DC

## 2021-06-27 MED ORDER — METOPROLOL TARTRATE 25 MG PO TABS
25.0000 mg | ORAL_TABLET | Freq: Two times a day (BID) | ORAL | Status: DC
  Administered 2021-06-27 – 2021-07-01 (×9): 25 mg via ORAL
  Filled 2021-06-27 (×9): qty 1

## 2021-06-27 MED ORDER — LORAZEPAM 2 MG/ML IJ SOLN
1.0000 mg | Freq: Once | INTRAMUSCULAR | Status: AC
  Administered 2021-06-27: 1 mg via INTRAVENOUS
  Filled 2021-06-27: qty 1

## 2021-06-27 MED ORDER — HEPARIN (PORCINE) 25000 UT/250ML-% IV SOLN
1050.0000 [IU]/h | INTRAVENOUS | Status: DC
  Administered 2021-06-27: 900 [IU]/h via INTRAVENOUS
  Administered 2021-06-28 – 2021-06-29 (×2): 1050 [IU]/h via INTRAVENOUS
  Filled 2021-06-27 (×3): qty 250

## 2021-06-27 MED ORDER — POTASSIUM CHLORIDE CRYS ER 20 MEQ PO TBCR
40.0000 meq | EXTENDED_RELEASE_TABLET | ORAL | Status: AC
  Administered 2021-06-27 (×2): 40 meq via ORAL
  Filled 2021-06-27 (×2): qty 2

## 2021-06-27 NOTE — Progress Notes (Signed)
ANTICOAGULATION CONSULT NOTE - Initial Consult ? ?Pharmacy Consult for Heparin  ?Indication: atrial fibrillation ? ?Allergies  ?Allergen Reactions  ? Dilaudid [Hydromorphone Hcl] Other (See Comments)  ?  Caused patient psychological disturbances   ? Doxycycline Hyclate Other (See Comments)  ?  diarrhea  ? Losartan Other (See Comments)  ?  Per MAR  ? Bactrim [Sulfamethoxazole-Trimethoprim] Rash  ? Elemental Sulfur Rash  ? Pantoprazole Diarrhea  ?  Gave her gas and diarrhea  ? ? ?Patient Measurements: ?Height: '5\' 2"'$  (157.5 cm) ?Weight: 69.2 kg (152 lb 8.9 oz) ?IBW/kg (Calculated) : 50.1 ?Heparin Dosing Weight: 64.6 kg  ? ?Vital Signs: ?Temp: 98.2 ?F (36.8 ?C) (04/01 0900) ?Temp Source: Oral (04/01 0900) ?BP: 122/64 (04/01 0900) ?Pulse Rate: 70 (04/01 0900) ? ?Labs: ?Recent Labs  ?  06/26/21 ?1452 06/27/21 ?0328  ?HGB 12.4 9.8*  ?HCT 37.8 29.7*  ?PLT 191 138*  ?CREATININE 4.95* 4.58*  ? ? ?Estimated Creatinine Clearance: 8.2 mL/min (A) (by C-G formula based on SCr of 4.58 mg/dL (H)). ? ? ?Medical History: ?Past Medical History:  ?Diagnosis Date  ? Acalculous cholecystitis   ? Arthritis   ? Blood transfusion   ? Blood transfusion without reported diagnosis   ? Cataract   ? bilateral-removed from both eyes  ? Coronary artery disease   ? mi  7/12,  stress test 12/21/2011=>negative for ischemia [name])  ? Diabetes mellitus   ? pt states she is not diabetic  ? Diverticulitis   ? Dizzy   ? Dyslipidemia   ? Fatigue   ? GERD (gastroesophageal reflux disease)   ? Glaucoma   ? Hypertension   ? Hypokalemia   ? 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 Mitral regurgitation   ? mild to moderate, cardiac echo 12/02/2011, EF 51% [name])  ? Myocardial infarction [facility])   ? 7/12  ? PONV (postoperative nausea and vomiting)   ? yrs ago  ? Shortness of breath   ? ? ?Assessment: ?89 YOF p/w AKI and atrial fibrillation on Xarelto PTA (last dose 3/30 1700). Initially planned to switch to  Eliquis for her AKI, but there is possibility she may need an intervention this admission. Discussed with MD and will start heparin. Hgb 9.8, PLT 138.  ? ?Goal of Therapy:  ?Heparin level 0.3-0.7 units/ml ?Monitor platelets by anticoagulation protocol: Yes ?  ?Plan:  ?Heparin bolus 2,500 units x1, then start heparin infusion at 900 unit/h  ?Obtain baseline heparin level/aPTT- discussed with RN to start heparin after levels are obtained  ?Daily heparin level/aPTT ?Monitor signs and symptoms of bleeding  ?F/U [facility] plan  ? ?Adria Dill, PharmD ?PGY-1 Acute Care Resident  ?06/27/2021 1:27 PM  ? ? ? ?

## 2021-06-27 NOTE — Progress Notes (Addendum)
?PROGRESS NOTE ? ? ? ?[name]  JXB:[id number] DOB: 06-May-1933 DOA: 06/26/2021 ?PCP: Holland Commons, FNP ? ? ?Brief Narrative:  ?88 year old white female history of diabetes, history of A-fib on chronic anticoagulation with Xarelto, history of chronic kidney disease stage IIIb followed by [place] [facility], coronary disease, hyperlipidemia presents to the hospital today from the PCP office.  She was going there Today for lab work for an upcoming Routine appointment at the end of the week.  She was called back with abnormal labs.  Serum creatinine is increased to over 5. ?  ?Patient states over the last 3 weeks, she has been feeling increasingly fatigued.  She has dyspnea on exertion. She states she can only walk about 50 feet before getting extremely short of breath ?  ?Patient's had intermittent diarrhea..  She states that this is not unusual for her.  Denies any fever or chills.  Denies any chest pain. ?  ?Patient states that she has had an obstructing kidney stone in the past.  [facility].  She states that she never had any pain with her kidney stone.  She states that when she went back to see urology, she was told that she may have passed a kidney stone without surgery. ?  ?  ?Renal ultrasound in December 2022 demonstrated bilateral renal stones.  These were nonobstructing at that time. ?  ?Due to patient's elevated serum creatinine, [facility] hospitalist contacted for admission.  ?  ?ED Course: labs show worsening Scr. EDP has notified nephrology of pt's admission ? ?Assessment & Plan: ?  ?Principal Problem: ?  Acute renal failure superimposed on stage 3b chronic kidney disease ([place]) ?Active Problems: ?  Hypertension ?  Diarrhea ?  Atrial fibrillation ([place]) ?  Coronary artery disease involving native coronary artery of native heart without angina pectoris ?  Chronic kidney disease, stage 3b ([place]) ?  Hyperlipidemia ? ?Acute renal failure superimposed on stage 3b chronic kidney  disease [facility]): CT renal stone study shows bilateral nonobstructive nephrolithiasis which she had previously as well.  Baseline creatinine around 1.3 with GFR around 40.  Presented with creatinine of 4.9 which has improved somewhat.  Nephrology on board, awaiting consultation.  We will resume her back on IV fluids. ? ?Acute sigmoid diverticulitis: Continue Rocephin and Flagyl. ? ?CBD dilatation with choledocholithiasis: No abdominal pain.  Ultrasound is completed but results pending however I will proceed with MRCP. ? ?Chronic microcytic anemia: Hemoglobin is stable.  We will check B12 and folate. ?  ?Coronary artery disease involving native coronary artery of native heart without angina pectoris ?Stable.  No chest pain. ?  ?Atrial fibrillation ([place]) ?Check EKG.  Continue Lopressor.  Continue amiodarone.  Due to elevated creatinine, Xarelto switched to Eliquis here. ?  ?Diarrhea ?Patient states that she has had chronic diarrhea.  This is not new. ?  ?Hypertension: Blood pressure controlled. ?Continue Lopressor. ?  ?Hyperlipidemia ?Continue statin. ?  ? ?DVT prophylaxis: SCDs Start: 06/26/21 2059 ?  Code Status: DNR  ?Family Communication: Patient's son present at bedside.  Plan of care discussed with him as well as patient in detail. ? ?Status is: Inpatient ?Remains inpatient appropriate because: Needs further work-up and management of issues mentioned above. ? ? ?Estimated body mass index is 27.9 kg/m? as calculated from the following: ?  Height as of this encounter: '5\' 2"'$  (1.575 m). ?  Weight as of this encounter: 69.2 kg. ? ?  ?Nutritional Assessment: ?Body mass index is 27.9  kg/m?Marland KitchenMarland Kitchen ?Seen by dietician.  I agree with the assessment and plan as outlined below: ?Nutrition Status: ?  ?  ?  ? ?. ?Skin Assessment: ?I have examined the patient's skin and I agree with the wound assessment as performed by the wound care RN as outlined below: ?  ? ?Consultants:  ?Nephrology ? ?Procedures:  ?None ? ?Antimicrobials:   ?Anti-infectives (From admission, onward)  ? ? Start     Dose/Rate Route Frequency Ordered Stop  ? 06/26/21 2200  metroNIDAZOLE (FLAGYL) IVPB 500 mg       ? 500 mg ?100 mL/hr over 60 Minutes Intravenous Every 12 hours 06/26/21 2048    ? 06/26/21 2100  cefTRIAXone (ROCEPHIN) 2 g in sodium chloride 0.9 % 100 mL IVPB       ? 2 g ?200 mL/hr over 30 Minutes Intravenous Every 24 hours 06/26/21 2048    ? ?  ?  ? ? ?Subjective: ?Patient seen and examined.  She states that she is just feels weak.  She did not have any specific complaint.  Patient very anxious about her skin being " ripped off" if someone to touch her.  Although she does not have bruises but she is worried about having them because she is on blood thinners. ? ?Objective: ?Vitals:  ? 06/26/21 2314 06/27/21 0051 06/27/21 0503 06/27/21 0900  ?BP: 120/64 (!) 118/56 127/60 122/64  ?Pulse: 81 69 71 70  ?Resp: '20 16 15 16  '$ ?Temp: 98.1 ?F (36.7 ?C) 98 ?F (36.7 ?C) 97.9 ?F (36.6 ?C) 98.2 ?F (36.8 ?C)  ?TempSrc: Oral Oral Oral Oral  ?SpO2:  99% 100% 96%  ?Weight: 69.2 kg     ?Height: '5\' 2"'$  (1.575 m)     ? ? ?Intake/Output Summary (Last 24 hours) at 06/27/2021 1115 ?Last data filed at 06/27/2021 0900 ?Gross per 24 hour  ?Intake 620 ml  ?Output 0 ml  ?Net 620 ml  ? ?Filed Weights  ? 06/26/21 2053 06/26/21 2314  ?Weight: 69.2 kg 69.2 kg  ? ? ?Examination: ? ?General exam: Appears calm and comfortable  ?Respiratory system: Clear to auscultation. Respiratory effort normal. ?Cardiovascular system: S1 & S2 heard, RRR. No JVD, murmurs, rubs, gallops or clicks. No pedal edema. ?Gastrointestinal system: Abdomen is nondistended, soft and very mild left lower quadrant tenderness with deep palpation. No organomegaly or masses felt. Normal bowel sounds heard. ?Central nervous system: Alert and oriented. No focal neurological deficits. ?Extremities: Symmetric 5 x 5 power. ?Skin: No rashes, lesions or ulcers ?Psychiatry: Judgement and insight appear normal. Mood & affect appropriate.   ? ? ?Data Reviewed: I have personally reviewed following labs and imaging studies ? ?CBC: ?Recent Labs  ?Lab 06/26/21 ?1452 06/27/21 ?0328  ?WBC 11.5* 8.7  ?NEUTROABS 9.2* 5.9  ?HGB 12.4 9.8*  ?HCT 37.8 29.7*  ?MCV 104.4* 104.2*  ?PLT 191 138*  ? ?Basic Metabolic Panel: ?Recent Labs  ?Lab 06/26/21 ?1452 06/26/21 ?1805 06/27/21 ?0328  ?NA 137  --  137  ?K 3.4*  --  2.9*  ?CL 103  --  108  ?CO2 21*  --  20*  ?GLUCOSE 130*  --  98  ?BUN 71*  --  66*  ?CREATININE 4.95*  --  4.58*  ?CALCIUM 8.4*  --  7.7*  ?MG  --  1.9 1.8  ?PHOS  --  4.3  --   ? ?GFR: ?Estimated Creatinine Clearance: 8.2 mL/min (A) (by C-G formula based on SCr of 4.58 mg/dL (H)). ?Liver Function Tests: ?Recent Labs  ?  Lab 06/26/21 ?1452 06/27/21 ?0328  ?AST 10* 6*  ?ALT 11 8  ?ALKPHOS 113 85  ?BILITOT 1.3* 0.5  ?PROT 6.3* 5.0*  ?ALBUMIN 2.7* 2.1*  ? ?No results for input(s): LIPASE, AMYLASE in the last 168 hours. ?No results for input(s): AMMONIA in the last 168 hours. ?Coagulation Profile: ?No results for input(s): INR, PROTIME in the last 168 hours. ?Cardiac Enzymes: ?No results for input(s): CKTOTAL, CKMB, CKMBINDEX, TROPONINI in the last 168 hours. ?BNP (last 3 results) ?No results for input(s): PROBNP in the last 8760 hours. ?HbA1C: ?No results for input(s): HGBA1C in the last 72 hours. ?CBG: ?No results for input(s): GLUCAP in the last 168 hours. ?Lipid Profile: ?No results for input(s): CHOL, HDL, LDLCALC, TRIG, CHOLHDL, LDLDIRECT in the last 72 hours. ?Thyroid Function Tests: ?No results for input(s): TSH, T4TOTAL, FREET4, T3FREE, THYROIDAB in the last 72 hours. ?Anemia Panel: ?No results for input(s): VITAMINB12, FOLATE, FERRITIN, TIBC, IRON, RETICCTPCT in the last 72 hours. ?Sepsis Labs: ?No results for input(s): PROCALCITON, LATICACIDVEN in the last 168 hours. ? ?No results found for this or any previous visit (from the past 240 hour(s)).  ? ?Radiology Studies: ?CT RENAL STONE STUDY ? ?Result Date: 06/26/2021 ?CLINICAL DATA:  Acute renal  failure, history of kidney stones. EXAM: CT ABDOMEN AND PELVIS WITHOUT CONTRAST TECHNIQUE: Multidetector CT imaging of the abdomen and pelvis was performed following the standard protocol without IV contrast. RADIATION D

## 2021-06-27 NOTE — Consult Note (Signed)
[name] ?Admit Date: 06/26/2021 ?06/27/2021 ?Rexene Agent ?Requesting Physician:  Bridgett Larsson DO ? ?Reason for Consult:  AoCKD4 ?HPI:  ?[name] presented to the ER yesterday with abnormal labs from PCP.  PMH includes A-fib on Xarelto, CAD, DM 2, hyperlipidemia, history of nephrolithiasis.  She has CKD followed by Dr. Joylene Grapes in our office with a creatinine around 2.8, most recently checked in February of this year.  Etiology of CKD is thought to be arterionephrosclerosis with potential obstructive stones causing some worsening over the past recent time.  Prior to having labs yesterday the patient was noting ongoing intermittent diarrhea but new onset anorexia nausea and vomiting.  Oral intake was less than usual of both solids and liquids.  She had continued taking her torsemide.  She has not use NSAIDs.  No contrast exposures.  No problems passing water. ? ?Her creatinine was 5 at PCP, in the ER on repeat it was similar.  This morning it is improved to 4.58.  Notably she has a potassium of 2.9 for which she has received supplementation, bicarbonate 20, hemoglobin 9.8.  She had a CT urogram that demonstrated bilateral nonobstructive stones.   Interestingly, imaging also revealed diverticulitis at the rectosigmoid junction without abscess or free air.  She has a history of cholecystectomy but had a mildly distended common bile duct with choledocholithiasis.  LFTs including total bilirubin are normal. ? ?No significant edema.  No petechia or purpura.  No urine analysis.  Trend in serum creatinine as below: ? ?Creatinine, Ser (mg/dL)  ?Date Value  ?06/27/2021 4.58 (H)  ?06/26/2021 4.95 (H)  ?01/23/2018 1.31 (H)  ?01/22/2018 1.67 (H)  ?01/21/2018 1.68 (H)  ?01/20/2018 1.61 (H)  ?01/20/2018 1.70 (H)  ?11/09/2017 1.08 (H)  ?11/08/2017 1.22 (H)  ?11/07/2017 1.17 (H)  ?] ?I/Os: ?I/O last 3 completed shifts: ?In: 500 [IV [id number]] ?Out: -   ? ?In the ED she received 0.5 L bolus normal saline. ? ?ROS ?Balance of 12  systems is negative w/ exceptions as above ? ?PMH  ?Past Medical History:  ?Diagnosis Date  ? Acalculous cholecystitis   ? Arthritis   ? Blood transfusion   ? Blood transfusion without reported diagnosis   ? Cataract   ? bilateral-removed from both eyes  ? Coronary artery disease   ? mi  7/12,  stress test 12/21/2011=>negative for ischemia [name])  ? Diabetes mellitus   ? pt states she is not diabetic  ? Diverticulitis   ? Dizzy   ? Dyslipidemia   ? Fatigue   ? GERD (gastroesophageal reflux disease)   ? Glaucoma   ? Hypertension   ? Hypokalemia   ? 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 Mitral regurgitation   ? mild to moderate, cardiac echo 12/02/2011, EF 51% [name])  ? Myocardial infarction [facility])   ? 7/12  ? PONV (postoperative nausea and vomiting)   ? yrs ago  ? Shortness of breath   ? ?PSH  ?Past Surgical History:  ?Procedure Laterality Date  ? ABDOMINAL HYSTERECTOMY    ? BILIARY DRAINAGE  9/12  ? BIOPSY  11/04/2017  ? Procedure: BIOPSY;  Surgeon: Jackquline Denmark, MD;  Location: [facility] ENDOSCOPY;  Service: Endoscopy;;  ? CARDIOVERSION N/A 02/12/2015  ? Procedure: CARDIOVERSION;  Surgeon: [name], MD;  Location: [place];  Service: Cardiovascular;  Laterality: N/A;  ? CARDIOVERSION N/A 11/10/2017  ? Procedure: CARDIOVERSION;  Surgeon: Nigel Mormon, MD;  Location: [place];  Service: Cardiovascular;  Laterality: N/A;  ? CATARACT EXTRACTION    ? 2  ? CHOLECYSTECTOMY  02/25/2011  ? Procedure: LAPAROSCOPIC CHOLECYSTECTOMY WITH INTRAOPERATIVE CHOLANGIOGRAM;  Surgeon: Gayland Curry, MD;  Location: [place];  Service: General;  Laterality: N/A;  ? CORONARY ANGIOPLASTY WITH STENT PLACEMENT  09/24/10  ? drug eluting stents  ? ESOPHAGOGASTRODUODENOSCOPY (EGD) WITH PROPOFOL N/A 11/04/2017  ? Procedure: ESOPHAGOGASTRODUODENOSCOPY (EGD) WITH PROPOFOL;  Surgeon: Jackquline Denmark, MD;  Location: [facility] ENDOSCOPY;  Service: Endoscopy;  Laterality: N/A;  ? GIVENS CAPSULE STUDY N/A  01/22/2018  ? Procedure: GIVENS CAPSULE STUDY;  Surgeon: Gatha Mayer, MD;  Location: [facility] ENDOSCOPY;  Service: Endoscopy;  Laterality: N/A;  ? PARATHYROIDECTOMY    ? tumor excision  ? SHOULDER SURGERY    ? right  ? SIGMOIDOSCOPY    ? flex sig in the 80's per pt.  ? ?FH  ?Family History  ?Problem Relation Age of Onset  ? Heart disease Father   ? Heart disease Brother   ? Melanoma Son   ? Cancer Maternal Grandfather   ? Colon cancer Neg Hx   ? Rectal cancer Neg Hx   ? Stomach cancer Neg Hx   ? ?[place]  reports that she has never smoked. She has never used smokeless tobacco. She reports current alcohol use. She reports that she does not use drugs. ?Allergies  ?Allergies  ?Allergen Reactions  ? Dilaudid [Hydromorphone Hcl] Other (See Comments)  ?  Caused patient psychological disturbances   ? Doxycycline Hyclate Other (See Comments)  ?  diarrhea  ? Losartan Other (See Comments)  ?  Per MAR  ? Bactrim [Sulfamethoxazole-Trimethoprim] Rash  ? Elemental Sulfur Rash  ? Pantoprazole Diarrhea  ?  Gave her gas and diarrhea  ? ?Home medications ?Prior to Admission medications   ?Medication Sig Start Date End Date Taking? Authorizing Provider  ?amiodarone (PACERONE) 200 MG tablet Take 1/2 (one-half) tablet by mouth once daily ?Patient taking differently: Take 100 mg by mouth daily. 05/05/21  Yes [name], MD  ?dorzolamide (TRUSOPT) 2 % ophthalmic solution Place 1 drop into the left eye 2 (two) times daily. (0800 & 1900)   Yes [provider]  ?DUPIXENT 300 MG/2ML SOPN Inject 300 mg into the skin every 14 (fourteen) days. 09/13/19  Yes [provider]  ?ferrous sulfate 325 (65 FE) MG EC tablet Take 1 tablet (325 mg total) by mouth 2 (two) times daily. 05/16/18 06/26/21 Yes Willia Craze, NP  ?ketoconazole (NIZORAL) 2 % cream Apply 1 application. topically 2 (two) times daily as needed for irritation. 10/17/19  Yes [provider]  ?latanoprost (XALATAN) 0.005 % ophthalmic solution Place 1 drop into the  left eye at bedtime. (1900) 07/28/17  Yes [provider]  ?metoprolol tartrate (LOPRESSOR) 25 MG tablet Take 1 tablet (25 mg total) by mouth 2 (two) times daily. 05/05/20  Yes [name], MD  ?Multiple Vitamins-Minerals (PRESERVISION AREDS 2) CAPS Take 1 capsule by mouth 2 (two) times daily.   Yes [provider]  ?nitroGLYCERIN (NITROSTAT) 0.4 MG SL tablet DISSOLVE ONE TABLET UNDER THE TONGUE EVERY 5 MINUTES AS NEEDED FOR CHEST PAIN.  DO NOT EXCEED A TOTAL OF 3 DOSES IN 15 MINUTES NOW ?Patient taking differently: Place 0.4 mg under the tongue every 5 (five) minutes as needed for chest pain. 03/05/20  Yes [name], MD  ?potassium chloride (KLOR-CON) 10 MEQ tablet TAKE 1 TABLET BY MOUTH ONCE DAILY WITH TORESEMIDE ?Patient taking differently: Take 10 mEq by  mouth daily. W/ torsemide 06/11/21  Yes [name], MD  ?Propylene Glycol (SYSTANE BALANCE OP) Apply 1 drop to eye 4 (four) times daily as needed (dry eyes).    Yes [provider]  ?Rivaroxaban (XARELTO) 15 MG TABS tablet TAKE 1 TABLET BY MOUTH ONCE DAILY AFTER SUPPER ?Patient taking differently: Take 15 mg by mouth daily with supper. 05/05/20  Yes [name], MD  ?rosuvastatin (CRESTOR) 5 MG tablet Take 5 mg by mouth at bedtime. (2000)   Yes [provider]  ?torsemide (DEMADEX) 20 MG tablet TAKE 1 TABLET BY MOUTH TWICE DAILY IN THE MORNING AND AT 2 PM FOR 3 POUND WEIGHT GAIN OR EDEMA OTHERWISE TAKE DAILY. ?Patient taking differently: Take 20 mg by mouth daily. MAY Take 1 tablet (20 mg) by mouth twice daily in the morning and at 2 PM for 3 lb weight gain or edema 05/05/21  Yes [name], MD  ?triamcinolone cream (KENALOG) 0.1 % Apply 1 application. topically 2 (two) times daily. 10/24/19  Yes [provider]  ?vitamin B-12 (CYANOCOBALAMIN) 500 MCG tablet Take 1 tablet (500 mcg total) by mouth daily. 01/23/18  Yes Elgergawy, Silver Huguenin, MD  ?betamethasone dipropionate (DIPROLENE) 0.05 % cream Apply topically 2 (two) times  daily. ?Patient not taking: Reported on 06/26/2021    [provider]  ?methocarbamol (ROBAXIN) 500 MG tablet Take 500 mg by mouth 4 (four) times daily as needed (pain).  ?Patient not taking: Reported on 3/31

## 2021-06-28 DIAGNOSIS — N179 Acute kidney failure, unspecified: Secondary | ICD-10-CM | POA: Diagnosis not present

## 2021-06-28 DIAGNOSIS — K838 Other specified diseases of biliary tract: Secondary | ICD-10-CM

## 2021-06-28 DIAGNOSIS — R933 Abnormal findings on diagnostic imaging of other parts of digestive tract: Secondary | ICD-10-CM

## 2021-06-28 DIAGNOSIS — N1832 Chronic kidney disease, stage 3b: Secondary | ICD-10-CM | POA: Diagnosis not present

## 2021-06-28 DIAGNOSIS — R932 Abnormal findings on diagnostic imaging of liver and biliary tract: Secondary | ICD-10-CM

## 2021-06-28 DIAGNOSIS — R1084 Generalized abdominal pain: Secondary | ICD-10-CM

## 2021-06-28 LAB — COMPREHENSIVE METABOLIC PANEL
ALT: 8 U/L (ref 0–44)
AST: 9 U/L — ABNORMAL LOW (ref 15–41)
Albumin: 2.1 g/dL — ABNORMAL LOW (ref 3.5–5.0)
Alkaline Phosphatase: 77 U/L (ref 38–126)
Anion gap: 9 (ref 5–15)
BUN: 59 mg/dL — ABNORMAL HIGH (ref 8–23)
CO2: 18 mmol/L — ABNORMAL LOW (ref 22–32)
Calcium: 8.1 mg/dL — ABNORMAL LOW (ref 8.9–10.3)
Chloride: 111 mmol/L (ref 98–111)
Creatinine, Ser: 4.69 mg/dL — ABNORMAL HIGH (ref 0.44–1.00)
GFR, Estimated: 9 mL/min — ABNORMAL LOW (ref >60)
Glucose, Bld: 112 mg/dL — ABNORMAL HIGH (ref 70–99)
Potassium: 3.8 mmol/L (ref 3.5–5.1)
Sodium: 138 mmol/L (ref 135–145)
Total Bilirubin: 0.2 mg/dL — ABNORMAL LOW (ref 0.3–1.2)
Total Protein: 5 g/dL — ABNORMAL LOW (ref 6.5–8.1)

## 2021-06-28 LAB — CBC
HCT: 29 % — ABNORMAL LOW (ref 36.0–46.0)
Hemoglobin: 9.7 g/dL — ABNORMAL LOW (ref 12.0–15.0)
MCH: 34.6 pg — ABNORMAL HIGH (ref 26.0–34.0)
MCHC: 33.4 g/dL (ref 30.0–36.0)
MCV: 103.6 fL — ABNORMAL HIGH (ref 80.0–100.0)
Platelets: 153 10*3/uL (ref 150–400)
RBC: 2.8 MIL/uL — ABNORMAL LOW (ref 3.87–5.11)
RDW: 13.5 % (ref 11.5–15.5)
WBC: 6.3 10*3/uL (ref 4.0–10.5)
nRBC: 0 % (ref 0.0–0.2)

## 2021-06-28 LAB — IRON AND TIBC
Iron: 80 ug/dL (ref 28–170)
Saturation Ratios: 45 % — ABNORMAL HIGH (ref 10.4–31.8)
TIBC: 179 ug/dL — ABNORMAL LOW (ref 250–450)
UIBC: 99 ug/dL

## 2021-06-28 LAB — FERRITIN: Ferritin: 458 ng/mL — ABNORMAL HIGH (ref 11–307)

## 2021-06-28 LAB — APTT: aPTT: 91 seconds — ABNORMAL HIGH (ref 24–36)

## 2021-06-28 LAB — HEPARIN LEVEL (UNFRACTIONATED)
Heparin Unfractionated: 0.4 IU/mL (ref 0.30–0.70)
Heparin Unfractionated: 0.46 IU/mL (ref 0.30–0.70)

## 2021-06-28 MED ORDER — METRONIDAZOLE 500 MG/100ML IV SOLN
500.0000 mg | Freq: Two times a day (BID) | INTRAVENOUS | Status: DC
Start: 2021-06-28 — End: 2021-06-30
  Administered 2021-06-28 – 2021-06-30 (×4): 500 mg via INTRAVENOUS
  Filled 2021-06-28 (×4): qty 100

## 2021-06-28 MED ORDER — METRONIDAZOLE 500 MG PO TABS: 500.0000 mg | ORAL_TABLET | Freq: Two times a day (BID) | ORAL | Status: DC

## 2021-06-28 NOTE — Progress Notes (Signed)
?PROGRESS NOTE ? ? ? ?[name]  LOV:[id number] DOB: 1933-05-18 DOA: 06/26/2021 ?PCP: Holland Commons, FNP ? ? ?Brief Narrative:  ?88 year old white female history of diabetes, history of A-fib on chronic anticoagulation with Xarelto, history of chronic kidney disease stage IIIb followed by [place] [facility], coronary disease, hyperlipidemia presented to the hospital from the PCP office.  She was going there Today for lab work for an upcoming Routine appointment at the end of the week.  She was called back with abnormal labs.  Serum creatinine is increased to over 5. ?  ?Patient states over the last 3 weeks, she has been feeling increasingly fatigued.  She has dyspnea on exertion. She states she can only walk about 50 feet before getting extremely short of breath ?  ?Patient's had intermittent diarrhea..  She states that this is not unusual for her.  Denies any fever or chills.  Denies any chest pain. ?  ?Patient states that she has had an obstructing kidney stone in the past.  [facility] is seeing [facility].  She states that she never had any pain with her kidney stone.  She states that when she went back to see urology, she was told that she may have passed a kidney stone without surgery. ?  ?Renal ultrasound in December 2022 demonstrated bilateral renal stones.  These were nonobstructing at that time. ?  ?Due to patient's elevated serum creatinine, [facility] hospitalist contacted for admission.  ? ?Assessment & Plan: ?  ?Principal Problem: ?  Acute renal failure superimposed on stage 3b chronic kidney disease ([place]) ?Active Problems: ?  Hypertension ?  Diarrhea ?  Atrial fibrillation ([place]) ?  Coronary artery disease involving native coronary artery of native heart without angina pectoris ?  Chronic kidney disease, stage 3b ([place]) ?  Hyperlipidemia ? ?Acute renal failure superimposed on stage 3b chronic kidney disease [facility]): CT renal stone study shows bilateral nonobstructive nephrolithiasis which she had  previously as well.  Baseline creatinine around 1.3 with GFR around 40.  Presented with creatinine of 4.9 which has improved somewhat.  Nephrology on board, watchful waiting is recommended by nephrology.  Morning labs are pending today. ? ?Acute sigmoid diverticulitis: Tolerating diet.  Continue Rocephin and Flagyl. ? ?CBD dilatation with choledocholithiasis: No abdominal pain.  Ultrasound is completed and MRCP also confirmed choledocholithiasis with slightly dilated CBD.  GI consulted.  Plan for ERCP at some point in time during this hospitalization.  Appreciate GI help. ? ?Chronic microcytic anemia: Hemoglobin is stable.  We will check B12 and folate. ?  ?Coronary artery disease involving native coronary artery of native heart without angina pectoris ?Stable.  No chest pain. ?  ?Atrial fibrillation ([place]) ?Check EKG.  Continue Lopressor.  Continue amiodarone.  Due to elevated creatinine, Xarelto switched to Eliquis here. ?  ?Diarrhea ?Patient states that she has had chronic diarrhea.  This is not new. ?  ?Hypertension: Blood pressure controlled. ?Continue Lopressor. ?  ?Hyperlipidemia ?Continue statin. ?  ? ?DVT prophylaxis: SCDs Start: 06/26/21 2059 ?  Code Status: DNR  ?Family Communication: Patient's son present at bedside.  Plan of care discussed with him as well as patient in detail. ? ?Status is: Inpatient ?Remains inpatient appropriate because: Needs further work-up and management of issues mentioned above. ? ? ?Estimated body mass index is 29.72 kg/m? as calculated from the following: ?  Height as of this encounter: '5\' 2"'$  (1.575 m). ?  Weight as of this encounter: 73.7 kg. ? ?  ?Nutritional Assessment: ?  Body mass index is 29.72 kg/m?Marland KitchenMarland Kitchen ?Seen by dietician.  I agree with the assessment and plan as outlined below: ?Nutrition Status: ?  ?  ?  ? ?. ?Skin Assessment: ?I have examined the patient's skin and I agree with the wound assessment as performed by the wound care RN as outlined below: ?  ? ?Consultants:   ?Nephrology ?GI ?Procedures:  ?None ? ?Antimicrobials:  ?Anti-infectives (From admission, onward)  ? ? Start     Dose/Rate Route Frequency Ordered Stop  ? 06/28/21 1000  metroNIDAZOLE (FLAGYL) tablet 500 mg  Status:  Discontinued       ? 500 mg Oral Every 12 hours 06/28/21 0752 06/28/21 0754  ? 06/28/21 1000  metroNIDAZOLE (FLAGYL) IVPB 500 mg       ? 500 mg ?100 mL/hr over 60 Minutes Intravenous Every 12 hours 06/28/21 0754    ? 06/26/21 2200  metroNIDAZOLE (FLAGYL) IVPB 500 mg  Status:  Discontinued       ? 500 mg ?100 mL/hr over 60 Minutes Intravenous Every 12 hours 06/26/21 2048 06/28/21 0752  ? 06/26/21 2100  cefTRIAXone (ROCEPHIN) 2 g in sodium chloride 0.9 % 100 mL IVPB       ? 2 g ?200 mL/hr over 30 Minutes Intravenous Every 24 hours 06/26/21 2048    ? ?  ?  ? ? ?Subjective: ?Seen and examined.  No complaints.  Son at the bedside. ? ?Objective: ?Vitals:  ? 06/27/21 2011 06/28/21 7616 06/28/21 0737 06/28/21 0857  ?BP: 100/71 125/70  125/72  ?Pulse: 87 75  74  ?Resp: '18 16  18  '$ ?Temp: 97.6 ?F (36.4 ?C) 97.8 ?F (36.6 ?C)  98 ?F (36.7 ?C)  ?TempSrc:  Oral  Oral  ?SpO2: 100% 97%  98%  ?Weight:   73.7 kg   ?Height:      ? ? ?Intake/Output Summary (Last 24 hours) at 06/28/2021 1344 ?Last data filed at 06/28/2021 1062 ?Gross per 24 hour  ?Intake 1778.12 ml  ?Output 200 ml  ?Net 1578.12 ml  ? ? ?Filed Weights  ? 06/26/21 2053 06/26/21 2314 06/28/21 0653  ?Weight: 69.2 kg 69.2 kg 73.7 kg  ? ? ?Examination: ? ?General exam: Appears calm and comfortable  ?Respiratory system: Clear to auscultation. Respiratory effort normal. ?Cardiovascular system: S1 & S2 heard, RRR. No JVD, murmurs, rubs, gallops or clicks. No pedal edema. ?Gastrointestinal system: Abdomen is nondistended, soft and nontender. No organomegaly or masses felt. Normal bowel sounds heard. ?Central nervous system: Alert and oriented. No focal neurological deficits. ?Extremities: Symmetric 5 x 5 power. ?Skin: No rashes, lesions or ulcers.  ?Psychiatry: Judgement  and insight appear normal. Mood & affect appropriate.  ? ?Data Reviewed: I have personally reviewed following labs and imaging studies ? ?CBC: ?Recent Labs  ?Lab 06/26/21 ?1452 06/27/21 ?0328 06/28/21 ?0421  ?WBC 11.5* 8.7 6.3  ?NEUTROABS 9.2* 5.9  --   ?HGB 12.4 9.8* 9.7*  ?HCT 37.8 29.7* 29.0*  ?MCV 104.4* 104.2* 103.6*  ?PLT 191 138* 153  ? ? ?Basic Metabolic Panel: ?Recent Labs  ?Lab 06/26/21 ?1452 06/26/21 ?1805 06/27/21 ?0328  ?NA 137  --  137  ?K 3.4*  --  2.9*  ?CL 103  --  108  ?CO2 21*  --  20*  ?GLUCOSE 130*  --  98  ?BUN 71*  --  66*  ?CREATININE 4.95*  --  4.58*  ?CALCIUM 8.4*  --  7.7*  ?MG  --  1.9 1.8  ?PHOS  --  4.3  --   ? ? ?  GFR: ?Estimated Creatinine Clearance: 8.4 mL/min (A) (by C-G formula based on SCr of 4.58 mg/dL (H)). ?Liver Function Tests: ?Recent Labs  ?Lab 06/26/21 ?1452 06/27/21 ?0328  ?AST 10* 6*  ?ALT 11 8  ?ALKPHOS 113 85  ?BILITOT 1.3* 0.5  ?PROT 6.3* 5.0*  ?ALBUMIN 2.7* 2.1*  ? ? ?No results for input(s): LIPASE, AMYLASE in the last 168 hours. ?No results for input(s): AMMONIA in the last 168 hours. ?Coagulation Profile: ?No results for input(s): INR, PROTIME in the last 168 hours. ?Cardiac Enzymes: ?No results for input(s): CKTOTAL, CKMB, CKMBINDEX, TROPONINI in the last 168 hours. ?BNP (last 3 results) ?No results for input(s): PROBNP in the last 8760 hours. ?HbA1C: ?No results for input(s): HGBA1C in the last 72 hours. ?CBG: ?No results for input(s): GLUCAP in the last 168 hours. ?Lipid Profile: ?No results for input(s): CHOL, HDL, LDLCALC, TRIG, CHOLHDL, LDLDIRECT in the last 72 hours. ?Thyroid Function Tests: ?No results for input(s): TSH, T4TOTAL, FREET4, T3FREE, THYROIDAB in the last 72 hours. ?Anemia Panel: ?Recent Labs  ?  06/27/21 ?1210  ?FKCLEXNT70 0,174*  ?FOLATE 5.1*  ? ?Sepsis Labs: ?No results for input(s): PROCALCITON, LATICACIDVEN in the last 168 hours. ? ?No results found for this or any previous visit (from the past 240 hour(s)).  ? ?Radiology Studies: ?MR  ABDOMEN MRCP WO CONTRAST ? ?Result Date: 06/27/2021 ?CLINICAL DATA:  Evaluate common bile duct obstruction. EXAM: MRI ABDOMEN WITHOUT CONTRAST  (INCLUDING MRCP) TECHNIQUE: Multiplanar multisequence MR imaging o

## 2021-06-28 NOTE — Evaluation (Signed)
Physical Therapy Evaluation ?Patient Details ?Name: [name] ?MRN: [id number] ?DOB: July 14, 1933 ?Today's Date: 06/28/2021 ? ?History of Present Illness ? 88 year old white female presents to the hospital 3/31 from the PCP office with high creatinine; history of diabetes, history of A-fib on chronic anticoagulation with Xarelto, history of chronic kidney disease stage IIIb followed by [place] [facility], coronary disease, hyperlipidemia  ?Clinical Impression ?  ?Pt admitted with above diagnosis. Lives at home with son, in a two-level home (has a stair lift)with 1 steps to enter; Prior to admission, pt was able to manage independently, drive; Presents to PT with generalized weakness; Overall moving well, and anticipate good progress;  Pt currently with functional limitations due to the deficits listed below (see PT Problem List). Pt will benefit from skilled PT to increase their independence and safety with mobility to allow discharge to the venue listed below.      ?   ? ?Recommendations for follow up therapy are one component of a multi-disciplinary discharge planning process, led by the attending physician.  Recommendations may be updated based on patient status, additional functional criteria and insurance authorization. ? ?Follow Up Recommendations Outpatient PT (Reports being busy with MD visits, and will likely decline Outpt PT) ? ?  ?Assistance Recommended at Discharge PRN  ?Patient can return home with the following ? Assist for transportation ? ?  ?Equipment Recommendations None recommended by PT  ?Recommendations for Other Services ?    ?  ?Functional Status Assessment Patient has had a recent decline in their functional status and demonstrates the ability to make significant improvements in function in a reasonable and predictable amount of time.  ? ?  ?Precautions / Restrictions Precautions ?Precautions: Fall ?Precaution Comments: fall risk is present, but minimal  ? ?  ? ?Mobility ? Bed  Mobility ?Overal bed mobility: Independent ?  ?  ?  ?  ?  ?  ?  ?  ? ?Transfers ?Overall transfer level: Needs assistance ?Equipment used: Rollator (4 wheels) ?Transfers: Sit to/from Stand ?Sit to Stand: Min guard ?  ?  ?  ?  ?  ?General transfer comment: [place] for safety ?  ? ?Ambulation/Gait ?Ambulation/Gait assistance: Min guard ?Gait Distance (Feet): 15 Feet ?Assistive device: Rollator (4 wheels) ?Gait Pattern/deviations: Step-through pattern, Decreased step length - right, Decreased step length - left ?Gait velocity: slowed ?  ?  ?General Gait Details: Cues to self-monitor for activity tolerance ; [place] for safety and for IV pole management ? ?Stairs ?  ?  ?  ?  ?  ? ?Wheelchair Mobility ?  ? ?Modified Rankin (Stroke Patients Only) ?  ? ?  ? ?Balance Overall balance assessment: Mild deficits observed, not formally tested ?  ?  ?  ?  ?  ?  ?  ?  ?  ?  ?  ?  ?  ?  ?  ?  ?  ?  ?   ? ? ? ?Pertinent Vitals/Pain Pain Assessment ?Pain Assessment: No/denies pain  ? ? ?Home Living Family/patient expects to be discharged to:: Private residence ?Living Arrangements: Children ?Available Help at Discharge: Family;Available PRN/intermittently (son works days) ?Type of Home: House ([place]) ?Home Access: Stairs to enter ?  ?Entrance Stairs-Number of Steps: 1 ?Alternate Level Stairs-Number of Steps: Flight ?Home Layout: Two level;Bed/bath upstairs (Has a chair lift) ?Home Equipment: Rollator (4 wheels);Shower seat - built in Multimedia programmer upstairs and downstairs; Stair lift) ?   ?  ?Prior Function Prior Level of Function : Independent/Modified Independent ?  ?  ?  ?  ?  ?  ?  Mobility Comments: Walks with Rollator; drives ?ADLs Comments: no difficulty ?  ? ? ?Hand Dominance  ?   ? ?  ?Extremity/Trunk Assessment  ? Upper Extremity Assessment ?Upper Extremity Assessment: Overall WFL for tasks assessed ?  ? ?Lower Extremity Assessment ?Lower Extremity Assessment: Generalized weakness ?  ? ?   ?Communication  ? Communication:  No difficulties  ?Cognition Arousal/Alertness: Awake/alert ?Behavior During Therapy: [facility] for tasks assessed/performed ?Overall Cognitive Status: Within Functional Limits for tasks assessed ?  ?  ?  ?  ?  ?  ?  ?  ?  ?  ?  ?  ?  ?  ?  ?  ?General Comments: very talkative ?  ?  ? ?  ?General Comments   ? ?  ?Exercises    ? ?Assessment/Plan  ?  ?PT Assessment Patient needs continued PT services  ?PT Problem List Decreased strength;Decreased activity tolerance ? ?   ?  ?PT Treatment Interventions DME instruction;Gait training;Stair training;Functional mobility training;Therapeutic activities;Therapeutic exercise;Patient/family education;Balance training   ? ?PT Goals (Current goals can be found in the Care Plan section)  ?Acute Rehab PT Goals ?Patient Stated Goal: Hopefully home soon ?PT Goal Formulation: With patient ?Time For Goal Achievement: 07/12/21 ?Potential to Achieve Goals: Good ? ?  ?Frequency Min 3X/week ?  ? ? ?Co-evaluation   ?  ?  ?  ?  ? ? ?  ?AM-PAC PT "6 Clicks" Mobility  ?Outcome Measure Help needed turning from your back to your side while in a flat bed without using bedrails?: None ?Help needed moving from lying on your back to sitting on the side of a flat bed without using bedrails?: None ?Help needed moving to and from a bed to a chair (including a wheelchair)?: None ?Help needed standing up from a chair using your arms (e.g., wheelchair or bedside chair)?: None ?Help needed to walk in hospital room?: A Little ?Help needed climbing 3-5 steps with a railing? : A Lot ?6 Click Score: 21 ? ?  ?End of Session   ?Activity Tolerance: Patient tolerated treatment well ?Patient left: in chair;with call bell/phone within reach ?  ?PT Visit Diagnosis: Unsteadiness on feet (R26.81) ?  ? ?Time: 7035-0093 ?PT Time Calculation (min) (ACUTE ONLY): 31 min ? ? ?Charges:   PT Evaluation ?$PT Eval Low Complexity: 1 Low ?PT Treatments ?$Gait Training: 8-22 mins ?  ?   ? ? ?Roney Marion, PT  ?Acute Rehabilitation  Services ?Pager [phone] ?Office [phone] ? ? ?[name] ?06/28/2021, 5:48 PM ? ?

## 2021-06-28 NOTE — Progress Notes (Addendum)
ANTICOAGULATION CONSULT NOTE  ? ?Pharmacy Consult for Heparin  ?Indication: atrial fibrillation ? ?Allergies  ?Allergen Reactions  ? Dilaudid [Hydromorphone Hcl] Other (See Comments)  ?  Caused patient psychological disturbances   ? Doxycycline Hyclate Other (See Comments)  ?  diarrhea  ? Losartan Other (See Comments)  ?  Per MAR  ? Bactrim [Sulfamethoxazole-Trimethoprim] Rash  ? Elemental Sulfur Rash  ? Pantoprazole Diarrhea  ?  Gave her gas and diarrhea  ? ? ?Patient Measurements: ?Height: '5\' 2"'$  (157.5 cm) ?Weight: 73.7 kg (162 lb 7.7 oz) ?IBW/kg (Calculated) : 50.1 ?Heparin Dosing Weight: 64.6 kg  ? ?Vital Signs: ?Temp: 98 ?F (36.7 ?C) (04/02 0857) ?Temp Source: Oral (04/02 0857) ?BP: 125/72 (04/02 0857) ?Pulse Rate: 74 (04/02 0857) ? ?Labs: ?Recent Labs  ?  06/26/21 ?1452 06/27/21 ?0328 06/27/21 ?1424 06/28/21 ?0421 06/28/21 ?1447 06/28/21 ?1532  ?HGB 12.4 9.8*  --  9.7*  --   --   ?HCT 37.8 29.7*  --  29.0*  --   --   ?PLT 191 138*  --  153  --   --   ?APTT  --   --  28 91*  --   --   ?HEPARINUNFRC  --   --  <0.10* 0.40  --  0.46  ?CREATININE 4.95* 4.58*  --   --  4.69*  --   ? ? ? ?Estimated Creatinine Clearance: 8.2 mL/min (A) (by C-G formula based on SCr of 4.69 mg/dL (H)). ? ? ?Medical History: ?Past Medical History:  ?Diagnosis Date  ? Acalculous cholecystitis   ? Arthritis   ? Blood transfusion   ? Blood transfusion without reported diagnosis   ? Cataract   ? bilateral-removed from both eyes  ? Coronary artery disease   ? mi  7/12,  stress test 12/21/2011=>negative for ischemia [name])  ? Diabetes mellitus   ? pt states she is not diabetic  ? Diverticulitis   ? Dizzy   ? Dyslipidemia   ? Fatigue   ? GERD (gastroesophageal reflux disease)   ? Glaucoma   ? Hypertension   ? Hypokalemia   ? 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 Mitral regurgitation   ? mild to moderate, cardiac echo 12/02/2011, EF 51% [name])  ? Myocardial infarction [facility])   ? 7/12  ? PONV  (postoperative nausea and vomiting)   ? yrs ago  ? Shortness of breath   ? ? ?Assessment: ?62 YOF p/w AKI and atrial fibrillation on Xarelto PTA (last dose 3/30 1700). Initially planned to switch to Eliquis for her AKI, but there is possibility she may need an intervention this admission.  ? ?Heparin level continues to be therapeutic.  ? ?Goal of Therapy:  ?Heparin level 0.3-0.7 units/ml ?Monitor platelets by anticoagulation protocol: Yes ?  ?Plan:  ?Continue heparin gtt at 1050 unit/h  ?Daily heparin level ?Monitor signs and symptoms of bleeding  ?F/U [facility] plan  ? ?Onnie Boer, PharmD, BCIDP, AAHIVP, CPP ?Infectious Disease Pharmacist ?06/28/2021 4:20 PM ? ? ? ? ?

## 2021-06-28 NOTE — Consult Note (Addendum)
? ? ? ? ? Consultation ? ?Referring Provider:   Dr. Doristine Bosworth ?Primary Care Physician:  [facility], [place] ?Primary Gastroenterologist:  Dr. Hilarie Fredrickson       ?Reason for Consultation:  Patient without any LFT elevation, white blood cell count normal.  Incidental finding of choledocholithiasis on CT renal stone.  Patient currently admitted with acute on chronic CKD.  ? ? Impression   ? ?Acute on chronic renal failure ?BUN 66 Cr 4.58  ?GFR 9  ?Potassium 2.9  Magnesium 1.8  ? ?Macrocytic anemia ?CBC on 06/28/2021   ?WBC 6.3 HGB 9.7 MCV 103.6 Platelets 153 ? B12 3,267 ?Patient receiving IV fluids, likely hemoglobin of 12 on admission was hemoconcentrated, currently at 9.7. ?Folate low, replete folate.  B12 normal ?History of iron deficiency anemia with AVMs, can get iron ferritin. ?Likely anemia of CKD contributing ? ?Diverticulitis ?On rocephin and flagyl ? ?Choledocholithiasis status post cholecystectomy ?WBC 6.3 HGB 9.7 Platelets 153 ?AST 6 ALT 8  ?Alkphos 85 TBili 0.5 ?06/27/2021 MRCP to choledocho lithiasis mild intrahepatic and common bile duct dilatation.  Several T2 hyperintense liver lesions technically incompletely characterized without IV contrast but are not significantly changed in size compared to the 06/26/21 CT favored to represent benign hemangioma. ? ?Atrial fibrillation ?Off xarelto, on heparin currently. ? ?CAD history ?No CP ? ? ? Plan  ? ?-Patient status post cholecystectomy with incidental choledocholithiasis found on renal CT, MRCP 06/27/2021 confirmed choledocholithiasis with mild intrahepatic and common bile duct dilatation. ?-Patient has normal liver function at this time, no elevated total bili, normal alk phos. ?Patient denies symptoms of right upper quadrant abdominal pain, describes more left flank discomfort.Marland Kitchen   ?Has had nausea vomiting several times in the last week after medications.  But no reflux. ?Discussed with the patient that at some point potentially this hospitalization would  likely benefit from ERCP for stone removal to prevent complications in the future.  We will wait for renal function to recover son and patient to gain some strength back. ?May benefit from PT referral when able. ? ?-Will monitor CBC and CMET ?-Continue supportive care ?-Continue IV hydration ?-Get iron, ferritin, TIBC ?-If any elevation of liver function or his hospitalization please make n.p.o. and scheduled for ERCP. ?-Otherwise we will reevaluate patient tomorrow and establish time for potential ERCP. ? ?Thank you for your kind consultation, we will continue to follow. ?       ? HPI:   ?[name] is a 88 y.o. female with past medical history significant for diabetes, hyperlipidemia, hypertension, moderate mitral regurgitation, coronary artery disease with MI 09/2010, stress test 2013 negative for ischemia, echocardiogram 11/06/2017 EF 60 to 65%, A-fib on chronic anticoagulation with Xarelto, CKD stage IIIb followed by [place] kidney anemia with history of blood transfusions, AVMs, status post cholecystectomy, diverticulosis. ? ?06/11/2014 colonoscopy  ?11/04/2017 upper endoscopy 4 to 6 mm nonbleeding erosions in gastric body, gastric antrum with moderate gastritis.  Biopsies reactive gastropathy. ?01/22/2018 capsule endoscopy for anemia AVMs on capsule endoscopy ? ?Patient admitted 06/26/2021 due to abnormal serum creatinine over 5 found at PCP. ?Patient was also complaining of dyspnea on exertion.  History of kidney stones. ?Patient had CT renal stone study which demonstrated bilateral nephrolithiasis as well as diverticulitis rectosigmoid junction without complication, mildly distended common bile duct with choledocholithiasis. ?06/27/2021 abdominal ultrasound right upper quadrant showed common bile duct 6 mm, no intra bile duct dilatation.  Known calcified distal common bile duct stones ?06/27/2021 MRCP to choledocho lithiasis mild intrahepatic  and common bile duct dilatation.  Several T2 hyperintense  liver lesions technically incompletely characterized without IV contrast but are not significantly changed in size compared to the 06/26/21 CT favored to represent benign hemangioma. ? ?AST 6, ALT 8, alk phos 85, total bilirubin 0.5 ?Potassium 2.9, BUN admitting 71, currently 66, creatinine 4.95, currently 4.58.  Baseline appears to be 1.6.  Pending today's CMET ?Admitting white blood cell count 11.5, currently 6.3. ?Admitting hemoglobin 12.4, current hemoglobin 9.7.  Had received large amount of IV fluids due to CKD, likely 12.4 was hemoconcentrated.  Appears baseline is closer to 11 ?Has echo cytosis MCV 103.6.  B12 normal, folate slightly low at 5.1 ?Platelets 153 ? ?Patient is currently on Rocephin and metronidazole for diverticulitis. ?Due to acute on chronic kidney disease patient is on heparin and off for Xarelto, last dose 06/26/2018 3 in the evening. ? ?Patient states she has had diarrhea for years, can be intermittent and last 2 to 3 days with 2-3 bowel movements a day.  Can be after food.  Had 1 episode last night after meatloaf. ?Patient's been feeling sick for the past 3 weeks with weakness, intermittent flank pain. ?In the past week she has had nausea and vomiting 2-3 times usually after taking medications in the morning on empty stomach. ?Patient denies reflux. ?Denies right upper quadrant abdominal pain. ?Denies blood in urine or stool. ?No melena. ?Denies yellowing of skin or eyes ?Marland Kitchen ?Previous GI work up: ?Last office visit 02/21/2018 with Nevin Bloodgood NP. ? ? ?Past Medical History:  ?Diagnosis Date  ? Acalculous cholecystitis   ? Arthritis   ? Blood transfusion   ? Blood transfusion without reported diagnosis   ? Cataract   ? bilateral-removed from both eyes  ? Coronary artery disease   ? mi  7/12,  stress test 12/21/2011=>negative for ischemia [name])  ? Diabetes mellitus   ? pt states she is not diabetic  ? Diverticulitis   ? Dizzy   ? Dyslipidemia   ? Fatigue   ? GERD (gastroesophageal reflux  disease)   ? Glaucoma   ? Hypertension   ? Hypokalemia   ? 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 Mitral regurgitation   ? mild to moderate, cardiac echo 12/02/2011, EF 51% [name])  ? Myocardial infarction [facility])   ? 7/12  ? PONV (postoperative nausea and vomiting)   ? yrs ago  ? Shortness of breath   ? ? ?Surgical History:  ?She  has a past surgical history that includes Parathyroidectomy; Abdominal hysterectomy; Cataract extraction; Coronary angioplasty with stent (09/24/10); Shoulder surgery; Biliary drainage (9/12); Cholecystectomy (02/25/2011); Sigmoidoscopy; Cardioversion (N/A, 02/12/2015); Esophagogastroduodenoscopy (egd) with propofol (N/A, 11/04/2017); biopsy (11/04/2017); Cardioversion (N/A, 11/10/2017); and Givens capsule study (N/A, 01/22/2018). ?Family History:  ?Her family history includes Cancer in her maternal grandfather; Heart disease in her brother and father; Melanoma in her son. ?Social History:  ? reports that she has never smoked. She has never used smokeless tobacco. She reports current alcohol use. She reports that she does not use drugs. ? ?Prior to Admission medications   ?Medication Sig Start Date End Date Taking? Authorizing Provider  ?amiodarone (PACERONE) 200 MG tablet Take 1/2 (one-half) tablet by mouth once daily ?Patient taking differently: Take 100 mg by mouth daily. 05/05/21  Yes [name], MD  ?dorzolamide (TRUSOPT) 2 % ophthalmic solution Place 1 drop into the left eye 2 (two) times daily. (0800 & 1900)   Yes [provider]  ?DUPIXENT 300 MG/2ML SOPN Inject 300 mg into the skin every 14 (fourteen) days. 09/13/19  Yes [provider]  ?ferrous sulfate 325 (65 FE) MG EC tablet Take 1 tablet (325 mg total) by mouth 2 (two) times daily. 05/16/18 06/26/21 Yes Willia Craze, NP  ?ketoconazole (NIZORAL) 2 % cream Apply 1 application. topically 2 (two) times daily as needed for irritation. 10/17/19  Yes [provider]   ?latanoprost (XALATAN) 0.005 % ophthalmic solution Place 1 drop into the left eye at bedtime. (1900) 07/28/17  Yes [provider]  ?metoprolol tartrate (LOPRESSOR) 25 MG tablet Take 1 tablet (25 mg t

## 2021-06-28 NOTE — Plan of Care (Signed)
  Problem: Activity: Goal: Risk for activity intolerance will decrease Outcome: Progressing   Problem: Nutrition: Goal: Adequate nutrition will be maintained Outcome: Progressing   Problem: Elimination: Goal: Will not experience complications related to urinary retention Outcome: Progressing   

## 2021-06-28 NOTE — Progress Notes (Addendum)
ANTICOAGULATION CONSULT NOTE  ? ?Pharmacy Consult for Heparin  ?Indication: atrial fibrillation ? ?Allergies  ?Allergen Reactions  ? Dilaudid [Hydromorphone Hcl] Other (See Comments)  ?  Caused patient psychological disturbances   ? Doxycycline Hyclate Other (See Comments)  ?  diarrhea  ? Losartan Other (See Comments)  ?  Per MAR  ? Bactrim [Sulfamethoxazole-Trimethoprim] Rash  ? Elemental Sulfur Rash  ? Pantoprazole Diarrhea  ?  Gave her gas and diarrhea  ? ? ?Patient Measurements: ?Height: '5\' 2"'$  (157.5 cm) ?Weight: 69.2 kg (152 lb 8.9 oz) ?IBW/kg (Calculated) : 50.1 ?Heparin Dosing Weight: 64.6 kg  ? ?Vital Signs: ?Temp: 97.6 ?F (36.4 ?C) (04/01 2011) ?BP: 100/71 (04/01 2011) ?Pulse Rate: 87 (04/01 2011) ? ?Labs: ?Recent Labs  ?  06/26/21 ?1452 06/27/21 ?0328 06/27/21 ?1424 06/28/21 ?0421  ?HGB 12.4 9.8*  --  9.7*  ?HCT 37.8 29.7*  --  29.0*  ?PLT 191 138*  --  153  ?APTT  --   --  28 91*  ?HEPARINUNFRC  --   --  <0.10* 0.40  ?CREATININE 4.95* 4.58*  --   --   ? ? ? ?Estimated Creatinine Clearance: 8.2 mL/min (A) (by C-G formula based on SCr of 4.58 mg/dL (H)). ? ? ?Medical History: ?Past Medical History:  ?Diagnosis Date  ? Acalculous cholecystitis   ? Arthritis   ? Blood transfusion   ? Blood transfusion without reported diagnosis   ? Cataract   ? bilateral-removed from both eyes  ? Coronary artery disease   ? mi  7/12,  stress test 12/21/2011=>negative for ischemia [name])  ? Diabetes mellitus   ? pt states she is not diabetic  ? Diverticulitis   ? Dizzy   ? Dyslipidemia   ? Fatigue   ? GERD (gastroesophageal reflux disease)   ? Glaucoma   ? Hypertension   ? Hypokalemia   ? 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 Mitral regurgitation   ? mild to moderate, cardiac echo 12/02/2011, EF 51% [name])  ? Myocardial infarction [facility])   ? 7/12  ? PONV (postoperative nausea and vomiting)   ? yrs ago  ? Shortness of breath   ? ? ?Assessment: ?39 YOF p/w AKI and atrial  fibrillation on Xarelto PTA (last dose 3/30 1700). Initially planned to switch to Eliquis for her AKI, but there is possibility she may need an intervention this admission.  ? ?Heparin level therapeutic: 0.40 - CBC stable, no documented infusion issues or s/sx of bleeding ? ?Goal of Therapy:  ?Heparin level 0.3-0.7 units/ml ?Monitor platelets by anticoagulation protocol: Yes ?  ?Plan:  ?Continue heparin gtt at 900 unit/h  ?Daily heparin level/aPTT ?Monitor signs and symptoms of bleeding  ?F/U [facility] plan  ? ?Georga Bora, PharmD ?Clinical Pharmacist ?06/28/2021 5:52 AM ?Please check AMION for all [place] numbers ? ? ? ?

## 2021-06-28 NOTE — Progress Notes (Signed)
Admit: 06/26/2021 ?LOS: 2 ? ?44F AoCKD3/4 in setting of diarrhea, N/V, poor PO, diuretic use and incidental findings of diverticulitis and choledocholithiasis. ? ?Subjective:  ?No interval events ?UOP not well captured 2/2 urine mixing with stools ?AM labs pending ?Pt w/o complaint, no SOB, LEE, tol IVFs LR @ 50m/h ?S/p abd [place]  ? ?04/01 0701 - 04/02 0700 ?In: 1898.1 [P.O.:480; I.V.:918.1; IV Piggyback:500] ?Out: 200 [Urine:200] ? ?Filed Weights  ? 06/26/21 2053 06/26/21 2314 06/28/21 0653  ?Weight: 69.2 kg 69.2 kg 73.7 kg  ? ? ?Scheduled Meds: ? amiodarone  100 mg Oral Daily  ? metoprolol tartrate  25 mg Oral BID  ? ?Continuous Infusions: ? cefTRIAXone (ROCEPHIN)  IV 2 g (06/27/21 2110)  ? heparin 1,050 Units/hr (06/27/21 1746)  ? lactated ringers 75 mL/hr at 06/27/21 1433  ? metronidazole 500 mg (06/28/21 0943)  ? ?PRN Meds:.acetaminophen **OR** acetaminophen, melatonin, ondansetron **OR** ondansetron (ZOFRAN) IV ? ?Current Labs: reviewed  ? ? ?Physical Exam:  Blood pressure 125/72, pulse 74, temperature 98 ?F (36.7 ?C), temperature source Oral, resp. rate 18, height '5\' 2"'$  (1.575 m), weight 73.7 kg, SpO2 98 %. ?GEN: Elderly female, NAD, lying flat in bed, breathing comfortably ?ENT: NCAT ?EYES: EOMI ?CV: Regular, normal S1 and S2 ?PULM: Clear bilaterally, normal work of breathing ?ABD: Soft but very mild tenderness in the left lower quadrant, NABS, no rebound or guarding ?SKIN: No rashes or lesions ?EXT: No peripheral edema ? ?A ?AoCKD3b: Likely a component of hypovolemia/hemodynamic changes given that creatinine has improved with supportive care from presentation and history suggests change in volume status;  BL SCr around 2.8 04/2021 ?Rectosigmoid diverticulitis on ceftriaxone and Flagyl, per primary ?Choledocholithiasis with history of cholecystectomy, per primary ?Hypokalemia, repleted, probably from GI losses, trend ?Macrocytic anemia, mild, CTM ?Mild metabolic acidosis ?DM2 ?Atrial fibrillation on  DOAC ?Nonobstructive nephrolithiasis ? ?P ?As above ?No change in plan ?Await AM [place]  ?Medication Issues; ?Preferred narcotic agents for pain control are hydromorphone, fentanyl, and methadone. Morphine should not be used.  ?Baclofen should be avoided ?Avoid oral sodium phosphate and magnesium citrate based laxatives / bowel preps  ? ? ?RPearson GrippeMD ?06/28/2021, 10:55 AM ? ?Recent Labs  ?Lab 06/26/21 ?1452 06/26/21 ?1805 06/27/21 ?0328  ?NA 137  --  137  ?K 3.4*  --  2.9*  ?CL 103  --  108  ?CO2 21*  --  20*  ?GLUCOSE 130*  --  98  ?BUN 71*  --  66*  ?CREATININE 4.95*  --  4.58*  ?CALCIUM 8.4*  --  7.7*  ?PHOS  --  4.3  --   ? ?Recent Labs  ?Lab 06/26/21 ?1452 06/27/21 ?0328 06/28/21 ?0421  ?WBC 11.5* 8.7 6.3  ?NEUTROABS 9.2* 5.9  --   ?HGB 12.4 9.8* 9.7*  ?HCT 37.8 29.7* 29.0*  ?MCV 104.4* 104.2* 103.6*  ?PLT 191 138* 153  ? ? ? ? ? ? ? ? ? ?  ?

## 2021-06-29 DIAGNOSIS — N1832 Chronic kidney disease, stage 3b: Secondary | ICD-10-CM | POA: Diagnosis not present

## 2021-06-29 DIAGNOSIS — N179 Acute kidney failure, unspecified: Secondary | ICD-10-CM | POA: Diagnosis not present

## 2021-06-29 DIAGNOSIS — K5732 Diverticulitis of large intestine without perforation or abscess without bleeding: Secondary | ICD-10-CM

## 2021-06-29 DIAGNOSIS — K8050 Calculus of bile duct without cholangitis or cholecystitis without obstruction: Secondary | ICD-10-CM

## 2021-06-29 LAB — URINALYSIS, ROUTINE W REFLEX MICROSCOPIC
Bilirubin Urine: NEGATIVE
Glucose, UA: NEGATIVE mg/dL
Hgb urine dipstick: NEGATIVE
Ketones, ur: NEGATIVE mg/dL
Leukocytes,Ua: NEGATIVE
Nitrite: NEGATIVE
Protein, ur: NEGATIVE mg/dL
Specific Gravity, Urine: 1.009 (ref 1.005–1.030)
pH: 5 (ref 5.0–8.0)

## 2021-06-29 LAB — CBC
HCT: 30.6 % — ABNORMAL LOW (ref 36.0–46.0)
Hemoglobin: 9.9 g/dL — ABNORMAL LOW (ref 12.0–15.0)
MCH: 34.4 pg — ABNORMAL HIGH (ref 26.0–34.0)
MCHC: 32.4 g/dL (ref 30.0–36.0)
MCV: 106.3 fL — ABNORMAL HIGH (ref 80.0–100.0)
Platelets: 165 10*3/uL (ref 150–400)
RBC: 2.88 MIL/uL — ABNORMAL LOW (ref 3.87–5.11)
RDW: 13.7 % (ref 11.5–15.5)
WBC: 6.3 10*3/uL (ref 4.0–10.5)
nRBC: 0 % (ref 0.0–0.2)

## 2021-06-29 LAB — HEPARIN LEVEL (UNFRACTIONATED): Heparin Unfractionated: 0.5 IU/mL (ref 0.30–0.70)

## 2021-06-29 LAB — COMPREHENSIVE METABOLIC PANEL
ALT: 8 U/L (ref 0–44)
AST: 9 U/L — ABNORMAL LOW (ref 15–41)
Albumin: 2.1 g/dL — ABNORMAL LOW (ref 3.5–5.0)
Alkaline Phosphatase: 72 U/L (ref 38–126)
Anion gap: 11 (ref 5–15)
BUN: 57 mg/dL — ABNORMAL HIGH (ref 8–23)
CO2: 17 mmol/L — ABNORMAL LOW (ref 22–32)
Calcium: 8.2 mg/dL — ABNORMAL LOW (ref 8.9–10.3)
Chloride: 110 mmol/L (ref 98–111)
Creatinine, Ser: 4.92 mg/dL — ABNORMAL HIGH (ref 0.44–1.00)
GFR, Estimated: 8 mL/min — ABNORMAL LOW (ref >60)
Glucose, Bld: 89 mg/dL (ref 70–99)
Potassium: 3.8 mmol/L (ref 3.5–5.1)
Sodium: 138 mmol/L (ref 135–145)
Total Bilirubin: 0.1 mg/dL — ABNORMAL LOW (ref 0.3–1.2)
Total Protein: 4.9 g/dL — ABNORMAL LOW (ref 6.5–8.1)

## 2021-06-29 NOTE — H&P (View-Only) (Signed)
? ? ?Progress Note ? ? Subjective  ?Patient without new complaint today ?Reports that she discussed ERCP at length yesterday with Dr. Rush Landmark and with her son.  She wishes to proceed with that while here in hopes to avoid future complications such as pancreatitis ?Small bowel movements without diarrhea, no blood or melena ? ? Objective  ?Vital signs in last 24 hours: ?Temp:  [97.5 ?F (36.4 ?C)-98.2 ?F (36.8 ?C)] 97.5 ?F (36.4 ?C) (04/03 7510) ?Pulse Rate:  [67-73] 67 (04/03 0816) ?Resp:  [15-18] 15 (04/03 0816) ?BP: (117-119)/(62-74) 117/62 (04/03 2585) ?SpO2:  [98 %-100 %] 99 % (04/03 0816) ?Weight:  [76.7 kg] 76.7 kg (04/03 0510) ?Last BM Date : 06/28/21 ? ?Gen: awake, alert, NAD ?HEENT: anicteric ?CV: RRR, no mrg ?Pulm: CTA b/l ?Abd: soft, NT/ND, +BS throughout ?Ext: no c/c/e ?Neuro: nonfocal ? ? ?Intake/Output from previous day: ?04/02 0701 - 04/03 0700 ?In: 2744.1 [P.O.:600; I.V.:1844.1; IV Piggyback:300] ?Out: 0  ?Intake/Output this shift: ?Total I/O ?In: 120 [P.O.:120] ?Out: -  ? ?Lab Results: ?Recent Labs  ?  06/27/21 ?0328 06/28/21 ?0421 06/29/21 ?0423  ?WBC 8.7 6.3 6.3  ?HGB 9.8* 9.7* 9.9*  ?HCT 29.7* 29.0* 30.6*  ?PLT 138* 153 165  ? ?BMET ?Recent Labs  ?  06/27/21 ?0328 06/28/21 ?1447 06/29/21 ?0423  ?NA 137 138 138  ?K 2.9* 3.8 3.8  ?CL 108 111 110  ?CO2 20* 18* 17*  ?GLUCOSE 98 112* 89  ?BUN 66* 59* 57*  ?CREATININE 4.58* 4.69* 4.92*  ?CALCIUM 7.7* 8.1* 8.2*  ? ?LFT ?Recent Labs  ?  06/29/21 ?0423  ?PROT 4.9*  ?ALBUMIN 2.1*  ?AST 9*  ?ALT 8  ?ALKPHOS 72  ?BILITOT 0.1*  ? ? ?Studies/Results: ?MR ABDOMEN MRCP WO CONTRAST ? ?Result Date: 06/27/2021 ?CLINICAL DATA:  Evaluate common bile duct obstruction. EXAM: MRI ABDOMEN WITHOUT CONTRAST  (INCLUDING MRCP) TECHNIQUE: Multiplanar multisequence MR imaging of the abdomen was performed. Heavily T2-weighted images of the biliary and pancreatic ducts were obtained, and three-dimensional MRCP images were rendered by post processing. COMPARISON:  CT AP  06/26/2021 FINDINGS: At the patient's request the examination was prematurely terminated after acquisition of the second series. Lower chest: No acute findings. Hepatobiliary: There are several T2 hyperintense liver lesions which are technically incompletely characterized without IV contrast but do not appear significantly changed in size or location when compared with remote CT from 12/04/2010. These are favored to represent a benign process such as biliary hemangiomas. There is mild intrahepatic bile duct dilatation. The common bile duct is dilated measuring 1.2 cm. As noted on the CT from 06/26/2021 there are multiple stones within the distal common bile duct measuring up to 1.2 cm, image 23/3. Pancreas: No mass, inflammatory changes, or other parenchymal abnormality identified. Spleen:  Within normal limits in size and appearance. Adrenals/Urinary Tract: No masses identified. No evidence of hydronephrosis. Stomach/Bowel: Bilateral renal cortical atrophy. No hydronephrosis or suspicious mass identified bilaterally. Vascular/Lymphatic: Aortic atherosclerosis without aneurysm. No adenopathy identified within the imaged portions of the upper abdomen. Other:  No ascites or focal fluid collections. Musculoskeletal: Degenerative disc disease identified within the thoracolumbar spine. IMPRESSION: 1. Exam detail diminished secondary to patient's inability to complete the exam. 2. Choledocholithiasis with mild intrahepatic and common bile duct dilatation. 3. There are several T2 hyperintense liver lesions which are technically incompletely characterized without IV contrast but are not significantly changed in size or location when compared with remote CT from 06/26/2021. These are favored to represent a benign process such as  biliary hemangiomas. Electronically Signed   By: Kerby Moors M.D.   On: 06/27/2021 17:39   ? ? ? Assessment & Recommendations  ?88 year old female with history of CKD stage IIIb, anemia,  diverticulosis, A-fib on Xarelto, history of nonadvanced adenomatous colon polyps, hypertension, hyperlipidemia CAD, diabetes. ? ?1.  Common duct stones --prior cholecystectomy; no evidence for biliary obstruction at this time.  Liver enzymes are normal.  ERCP would be indicated for stone removal in an attempt for avoid future complications including failure obstruction/cholangitis and pancreatitis.  She I reviewed the risks, benefits and alternatives with her today again and these were also discussed with her and her son yesterday by Dr. Rush Landmark.  Risks include bleeding, pancreatitis which would have potential to be severe and potentially life-threatening, and the risk of general anesthesia.  After reviewing these risks she wishes to proceed.  She reports her son is in agreement.  I have also discussed this with Dr. Carlean Purl, my biliary colic this week. ?--Tentative plan for ERCP tomorrow afternoon at 130 with general anesthesia ?--N.p.o. after midnight ?--Heparin infusion will need to be held 6 hours prior to ERCP ?--I will discuss the use of indomethacin with Dr. Carlean Purl though with her worsening renal function we may choose to forego this; doing so would slightly raise the risk of post ERCP pancreatitis. ? ?2.  Diverticulitis --uncomplicated and seen by CT on admission.  Abdomen is not overly tender.  5 days of antibiotic should suffice.  White count is normal. ? ?3.  Acute on chronic renal injury --following with nephrology, avoiding nephrotoxins.  No plan for bowel prep, magnesium or sodium phosphate based bowel prep. ? ?4.  Chronic diarrhea --this has been an issue for her for years dating back to at least 2016 when Dr. Olevia Perches did random biopsies to exclude microscopic colitis.  Diarrhea not currently an issue for her in the hospital. ? ? ? ? ? LOS: 3 days  ? ?[name]  06/29/2021, 10:50 AM ?See Shea Evans, [facility], to contact our on call provider ? ? ?

## 2021-06-29 NOTE — TOC Transition Note (Addendum)
Transition of Care (TOC) - CM/SW Discharge Note ? ? ?Patient Details  ?Name: [name] ?MRN: [id number] ?Date of Birth: 08-15-1933 ? ?Transition of Care (TOC) CM/SW Contact:  ?Tom-Johnson, Renea Ee, RN ?Phone Number: ?06/29/2021, 2:31 PM ? ? ?Clinical Narrative:    ? ?CM spoke with patient at bedside about needs for post hospital transition. Admitted for ARF on CKD Stage 3b.  ?From home with son. Had two sons, one recently deceased and patient states she is still grieving. Independent with care and drive self prior to admission. Has two rollators, cane, raised toilet seat and handicapped bathroom.  ?PCP is Adria Dill, [place] Reader, FNP and uses [facility] on [address]. ?PT recommended outpatient PT. Patient declined stating that will bring strain on her family as her son works and cannot take off to transport her. States she has Home health aide from [facility] one day a week and can increase the days if she wants to.  ?Son to transport at discharge. CM will continue to follow with needs. ? ?Final next level of care: OP Rehab (Declines) ?Barriers to Discharge: Continued Medical Work up ? ? ?Patient Goals and CMS Choice ?Patient states their goals for this hospitalization and ongoing recovery are:: To return home ?CMS Medicare.gov Compare Post Acute Care list provided to:: Patient ?Choice offered to / list presented to : Patient ? ?Discharge Placement ?  ?           ?  ?  ?  ?  ? ?Discharge Plan and Services ?  ?Discharge Planning Services: CM Consult ?Post Acute Care Choice:  (Outpatient PT)          ?DME Arranged: N/A ?DME Agency: NA ?  ?  ?  ?HH Arranged: NA ?[place] Agency: NA ?  ?  ?  ? ?Social Determinants of Health (SDOH) Interventions ?  ? ? ?Readmission Risk Interventions ? ?  06/29/2021  ?  2:22 PM  ?Readmission Risk Prevention Plan  ?Post Dischage Appt Complete  ?Medication Screening Complete  ?Transportation Screening Complete  ? ? ? ? ? ?

## 2021-06-29 NOTE — Progress Notes (Signed)
ANTICOAGULATION CONSULT NOTE ? ?Pharmacy Consult for Heparin ?Indication: atrial fibrillation ? ?Allergies  ?Allergen Reactions  ? Dilaudid [Hydromorphone Hcl] Other (See Comments)  ?  Caused patient psychological disturbances   ? Doxycycline Hyclate Other (See Comments)  ?  diarrhea  ? Losartan Other (See Comments)  ?  Per MAR  ? Bactrim [Sulfamethoxazole-Trimethoprim] Rash  ? Elemental Sulfur Rash  ? Pantoprazole Diarrhea  ?  Gave her gas and diarrhea  ? ? ?Patient Measurements: ?Height: '5\' 2"'$  (157.5 cm) ?Weight: 76.7 kg (169 lb 1.5 oz) ?IBW/kg (Calculated) : 50.1 ? ?Heparin Dosing Weight: 65 kg ? ?Vital Signs: ?Temp: 97.5 ?F (36.4 ?C) (04/03 4270) ?Temp Source: Oral (04/03 6237) ?BP: 117/62 (04/03 0816) ?Pulse Rate: 67 (04/03 0816) ? ?Labs: ?Recent Labs  ?  06/27/21 ?0328 06/27/21 ?1424 06/27/21 ?1424 06/28/21 ?0421 06/28/21 ?1447 06/28/21 ?1532 06/29/21 ?0423  ?HGB 9.8*  --   --  9.7*  --   --  9.9*  ?HCT 29.7*  --   --  29.0*  --   --  30.6*  ?PLT 138*  --   --  153  --   --  165  ?APTT  --  28  --  91*  --   --   --   ?HEPARINUNFRC  --  <0.10*   < > 0.40  --  0.46 0.50  ?CREATININE 4.58*  --   --   --  4.69*  --  4.92*  ? < > = values in this interval not displayed.  ? ? ?Estimated Creatinine Clearance: 8 mL/min (A) (by C-G formula based on SCr of 4.92 mg/dL (H)). ? ? ?Assessment: ?66 YOF p/w AKI and atrial fibrillation on Xarelto PTA (last dose 3/30 1700). Initially planned to switch to Eliquis for her AKI on CKD, but there is possibility she may need an intervention this admission. Pharmacy consulted to manage heparin while DOAC on hold. ? ?Heparin level therapeutic at 0.50 on current heparin rate of 1050 units/hr. CBC stable, platelets 165. No infusion issues reported. No s/sx bleeding reported. ? ? ?Goal of Therapy:  ?Heparin level 0.3-0.7 units/ml ?Monitor platelets by anticoagulation protocol: Yes ?  ?Plan:  ?Continue heparin infusion at 1050 units/hr ?Check heparin level daily while on  heparin ?Follow-up surgical plans and plans to transition to [place] ?Continue to monitor H&H and platelets ? ? ? ?Thank you for allowing pharmacy to be a part of this patient?s care. ? ?Ardyth Harps, PharmD ?Clinical Pharmacist ? ? ?

## 2021-06-29 NOTE — Progress Notes (Signed)
Admit: 06/26/2021 ?LOS: 3 ? ?59F AoCKD3/4 in setting of diarrhea, N/V, poor PO, diuretic use and incidental findings of diverticulitis and choledocholithiasis. ? ?Subjective:  ?No acute events. Feeling overall some better ?Didn't tolerate lunch very well today- beef patty didn't agree with her ? ?04/02 0701 - 04/03 0700 ?In: 2744.1 [P.O.:600; I.V.:1844.1; IV Piggyback:300] ?Out: 0  ? ?Filed Weights  ? 06/26/21 2314 06/28/21 0653 06/29/21 0510  ?Weight: 69.2 kg 73.7 kg 76.7 kg  ? ? ?Scheduled Meds: ? amiodarone  100 mg Oral Daily  ? metoprolol tartrate  25 mg Oral BID  ? ?Continuous Infusions: ? cefTRIAXone (ROCEPHIN)  IV Stopped (06/28/21 2254)  ? heparin 1,050 Units/hr (06/29/21 1457)  ? lactated ringers 75 mL/hr at 06/29/21 0400  ? metronidazole 500 mg (06/29/21 0855)  ? ?PRN Meds:.acetaminophen **OR** acetaminophen, melatonin, ondansetron **OR** ondansetron (ZOFRAN) IV ? ?Current Labs: reviewed  ? ? ?Physical Exam:  Blood pressure 117/62, pulse 67, temperature (!) 97.5 ?F (36.4 ?C), temperature source Oral, resp. rate 15, height '5\' 2"'$  (1.575 m), weight 76.7 kg, SpO2 99 %. ?GEN: Elderly female, NAD, lying flat in bed, breathing comfortably ?ENT: NCAT ?CV: Regular, normal S1 and S2 ?PULM: Clear bilaterally, normal work of breathing ?ABD: Soft but very mild tenderness in the left lower quadrant, NABS, no rebound or guarding ?SKIN: No rashes or lesions ?EXT: No peripheral edema- very thin skin with multiple bruises ? ?A/P ?AoCKD3b: Likely a component of hypovolemia/hemodynamic changes given that creatinine has improved with supportive care from presentation and history suggests change in volume status;  BL SCr around 2.8 04/2021.  NO better but no worse- will continue with IVFs for now.  With AKI don't recommend indomethacin use ?Rectosigmoid diverticulitis on ceftriaxone and Flagyl, per primary ?Choledocholithiasis with history of cholecystectomy. Getting ERCP tomorrow 4/4. ?Hypokalemia, repleted, probably from GI  losses, trend ?Macrocytic anemia, mild, CTM ?Mild metabolic acidosis ?DM2 ?Atrial fibrillation on DOAC ?Nonobstructive nephrolithiasis ? ? ?Madelon Lips MD ?06/29/2021, 4:03 PM ? ?Recent Labs  ?Lab 06/26/21 ?1805 06/27/21 ?0328 06/28/21 ?1447 06/29/21 ?0423  ?NA  --  137 138 138  ?K  --  2.9* 3.8 3.8  ?CL  --  108 111 110  ?CO2  --  20* 18* 17*  ?GLUCOSE  --  98 112* 89  ?BUN  --  66* 59* 57*  ?CREATININE  --  4.58* 4.69* 4.92*  ?CALCIUM  --  7.7* 8.1* 8.2*  ?PHOS 4.3  --   --   --   ? ?Recent Labs  ?Lab 06/26/21 ?1452 06/27/21 ?0328 06/28/21 ?0421 06/29/21 ?0423  ?WBC 11.5* 8.7 6.3 6.3  ?NEUTROABS 9.2* 5.9  --   --   ?HGB 12.4 9.8* 9.7* 9.9*  ?HCT 37.8 29.7* 29.0* 30.6*  ?MCV 104.4* 104.2* 103.6* 106.3*  ?PLT 191 138* 153 165  ? ? ? ? ? ? ? ? ? ?  ?

## 2021-06-29 NOTE — Care Management Important Message (Signed)
Important Message ? ?Patient Details  ?Name: [name] ?MRN: [id number] ?Date of Birth: 01-Sep-1933 ? ? ?Medicare Important Message Given:  Yes ? ? ? ? ?[name] ?06/29/2021, 2:51 PM ?

## 2021-06-29 NOTE — Progress Notes (Signed)
? ? ?Progress Note ? ? Subjective  ?Patient without new complaint today ?Reports that she discussed ERCP at length yesterday with [name] and with her son.  She wishes to proceed with that while here in hopes to avoid future complications such as pancreatitis ?Small bowel movements without diarrhea, no blood or melena ? ? Objective  ?Vital signs in last 24 hours: ?Temp:  [97.5 ?F (36.4 ?C)-98.2 ?F (36.8 ?C)] 97.5 ?F (36.4 ?C) (04/03 1751) ?Pulse Rate:  [67-73] 67 (04/03 0816) ?Resp:  [15-18] 15 (04/03 0816) ?BP: (117-119)/(62-74) 117/62 (04/03 0258) ?SpO2:  [98 %-100 %] 99 % (04/03 0816) ?Weight:  [76.7 kg] 76.7 kg (04/03 0510) ?Last BM Date : 06/28/21 ? ?Gen: awake, alert, NAD ?HEENT: anicteric ?CV: RRR, no mrg ?Pulm: CTA b/l ?Abd: soft, NT/ND, +BS throughout ?Ext: no c/c/e ?Neuro: nonfocal ? ? ?Intake/Output from previous day: ?04/02 0701 - 04/03 0700 ?In: 2744.1 [P.O.:600; I.V.:1844.1; IV Piggyback:300] ?Out: 0  ?Intake/Output this shift: ?Total I/O ?In: 120 [P.O.:120] ?Out: -  ? ?Lab Results: ?Recent Labs  ?  06/27/21 ?0328 06/28/21 ?0421 06/29/21 ?0423  ?WBC 8.7 6.3 6.3  ?HGB 9.8* 9.7* 9.9*  ?HCT 29.7* 29.0* 30.6*  ?PLT 138* 153 165  ? ?BMET ?Recent Labs  ?  06/27/21 ?0328 06/28/21 ?1447 06/29/21 ?0423  ?NA 137 138 138  ?K 2.9* 3.8 3.8  ?CL 108 111 110  ?CO2 20* 18* 17*  ?GLUCOSE 98 112* 89  ?BUN 66* 59* 57*  ?CREATININE 4.58* 4.69* 4.92*  ?CALCIUM 7.7* 8.1* 8.2*  ? ?LFT ?Recent Labs  ?  06/29/21 ?0423  ?PROT 4.9*  ?ALBUMIN 2.1*  ?AST 9*  ?ALT 8  ?ALKPHOS 72  ?BILITOT 0.1*  ? ? ?Studies/Results: ?MR ABDOMEN MRCP WO CONTRAST ? ?Result Date: 06/27/2021 ?CLINICAL DATA:  Evaluate common bile duct obstruction. EXAM: MRI ABDOMEN WITHOUT CONTRAST  (INCLUDING MRCP) TECHNIQUE: Multiplanar multisequence MR imaging of the abdomen was performed. Heavily T2-weighted images of the biliary and pancreatic ducts were obtained, and three-dimensional MRCP images were rendered by post processing. COMPARISON:  CT AP  06/26/2021 FINDINGS: At the patient's request the examination was prematurely terminated after acquisition of the second series. Lower chest: No acute findings. Hepatobiliary: There are several T2 hyperintense liver lesions which are technically incompletely characterized without IV contrast but do not appear significantly changed in size or location when compared with remote CT from 12/04/2010. These are favored to represent a benign process such as biliary hemangiomas. There is mild intrahepatic bile duct dilatation. The common bile duct is dilated measuring 1.2 cm. As noted on the CT from 06/26/2021 there are multiple stones within the distal common bile duct measuring up to 1.2 cm, image 23/3. Pancreas: No mass, inflammatory changes, or other parenchymal abnormality identified. Spleen:  Within normal limits in size and appearance. Adrenals/Urinary Tract: No masses identified. No evidence of hydronephrosis. Stomach/Bowel: Bilateral renal cortical atrophy. No hydronephrosis or suspicious mass identified bilaterally. Vascular/Lymphatic: Aortic atherosclerosis without aneurysm. No adenopathy identified within the imaged portions of the upper abdomen. Other:  No ascites or focal fluid collections. Musculoskeletal: Degenerative disc disease identified within the thoracolumbar spine. IMPRESSION: 1. Exam detail diminished secondary to patient's inability to complete the exam. 2. Choledocholithiasis with mild intrahepatic and common bile duct dilatation. 3. There are several T2 hyperintense liver lesions which are technically incompletely characterized without IV contrast but are not significantly changed in size or location when compared with remote CT from 06/26/2021. These are favored to represent a benign process such as  biliary hemangiomas. Electronically Signed   By: [name] M.D.   On: 06/27/2021 17:39   ? ? ? Assessment & Recommendations  ?88 year old female with history of CKD stage IIIb, anemia,  diverticulosis, A-fib on Xarelto, history of nonadvanced adenomatous colon polyps, hypertension, hyperlipidemia CAD, diabetes. ? ?1.  Common duct stones --prior cholecystectomy; no evidence for biliary obstruction at this time.  Liver enzymes are normal.  ERCP would be indicated for stone removal in an attempt for avoid future complications including failure obstruction/cholangitis and pancreatitis.  She I reviewed the risks, benefits and alternatives with her today again and these were also discussed with her and her son yesterday by [name].  Risks include bleeding, pancreatitis which would have potential to be severe and potentially life-threatening, and the risk of general anesthesia.  After reviewing these risks she wishes to proceed.  She reports her son is in agreement.  I have also discussed this with [name], my biliary colic this week. ?--Tentative plan for ERCP tomorrow afternoon at 130 with general anesthesia ?--N.p.o. after midnight ?--Heparin infusion will need to be held 6 hours prior to ERCP ?--I will discuss the use of indomethacin with [name] though with her worsening renal function we may choose to forego this; doing so would slightly raise the risk of post ERCP pancreatitis. ? ?2.  Diverticulitis --uncomplicated and seen by CT on admission.  Abdomen is not overly tender.  5 days of antibiotic should suffice.  White count is normal. ? ?3.  Acute on chronic renal injury --following with nephrology, avoiding nephrotoxins.  No plan for bowel prep, magnesium or sodium phosphate based bowel prep. ? ?4.  Chronic diarrhea --this has been an issue for her for years dating back to at least 2016 when [name] did random biopsies to exclude microscopic colitis.  Diarrhea not currently an issue for her in the hospital. ? ? ? ? ? LOS: 3 days  ? ?[name] [name]  06/29/2021, 10:50 AM ?See [name], [facility], to contact our on call provider ? ? ?

## 2021-06-29 NOTE — Progress Notes (Signed)
?PROGRESS NOTE ? ? ? ?[name]  ZOX:[id number] DOB: 1933/08/12 DOA: 06/26/2021 ?PCP: Holland Commons, FNP ? ? ?Brief Narrative:  ?88 year old white female history of diabetes, history of A-fib on chronic anticoagulation with Xarelto, history of chronic kidney disease stage IIIb followed by [facility], coronary disease, hyperlipidemia presented to the hospital from the PCP office.  She was going there for lab work for an upcoming Routine appointment at the end of the week.  She was called back with abnormal labs.  Serum creatinine is increased to over 5. ?  ?Patient states over the last 3 weeks, she has been feeling increasingly fatigued.  She has dyspnea on exertion. She states she can only walk about 50 feet before getting extremely short of breath. Patient's had intermittent diarrhea..  She states that this is not unusual for her.  She also carries a history of multiple kidney stones and follows with [facility].  She was admitted with following issues. ? ?Assessment & Plan: ?  ?Principal Problem: ?  Acute renal failure superimposed on stage 3b chronic kidney disease ([place]) ?Active Problems: ?  Hypertension ?  Diarrhea ?  Atrial fibrillation ([place]) ?  Coronary artery disease involving native coronary artery of native heart without angina pectoris ?  Chronic kidney disease, stage 3b ([place]) ?  Hyperlipidemia ? ?Acute renal failure superimposed on stage 3b chronic kidney disease [facility]): CT renal stone study shows bilateral nonobstructive nephrolithiasis which she had previously as well.  Baseline creatinine around 1.3 with GFR around 40.  Presented with creatinine of 4.9 Which has been fluctuating but essentially is once again around 4.92 today.  Nephrology on board, defer to them. ? ?Acute sigmoid diverticulitis: Tolerating diet.  Continue Rocephin and Flagyl. ? ?CBD dilatation with choledocholithiasis: No abdominal pain.  Ultrasound is completed and MRCP also confirmed choledocholithiasis with  slightly dilated CBD.  GI consulted.  Plan for ERCP tomorrow. ? ?Chronic macrocytic anemia: She has folate deficiency.  I will start her on folate replacement.  Hemoglobin is stable. ? ?Coronary artery disease involving native coronary artery of native heart without angina pectoris ?Stable.  No chest pain. ?  ?Atrial fibrillation ([place]) ?Check EKG.  Continue Lopressor.  Continue amiodarone.  She is currently on heparin drip for pending procedure tomorrow. ?  ?Diarrhea ?Patient states that she has had chronic diarrhea.  This is not new. ?  ?Hypertension: Blood pressure controlled. ?Continue Lopressor. ?  ?Hyperlipidemia ?Continue statin. ?  ? ?DVT prophylaxis: SCDs Start: 06/26/21 2059 ?  Code Status: DNR  ?Family Communication: None at the bedside today. ? ?Status is: Inpatient ?Remains inpatient appropriate because: Needs further work-up and management of issues mentioned above. ? ? ?Estimated body mass index is 30.93 kg/m? as calculated from the following: ?  Height as of this encounter: '5\' 2"'$  (1.575 m). ?  Weight as of this encounter: 76.7 kg. ? ?  ?Nutritional Assessment: ?Body mass index is 30.93 kg/m?Marland KitchenMarland Kitchen ?Seen by dietician.  I agree with the assessment and plan as outlined below: ?Nutrition Status: ?  ?  ?  ? ?. ?Skin Assessment: ?I have examined the patient's skin and I agree with the wound assessment as performed by the wound care RN as outlined below: ?  ? ?Consultants:  ?Nephrology ?GI ?Procedures:  ?None ? ?Antimicrobials:  ?Anti-infectives (From admission, onward)  ? ? Start     Dose/Rate Route Frequency Ordered Stop  ? 06/28/21 1000  metroNIDAZOLE (FLAGYL) tablet 500 mg  Status:  Discontinued       ?  500 mg Oral Every 12 hours 06/28/21 0752 06/28/21 0754  ? 06/28/21 1000  metroNIDAZOLE (FLAGYL) IVPB 500 mg       ? 500 mg ?100 mL/hr over 60 Minutes Intravenous Every 12 hours 06/28/21 0754    ? 06/26/21 2200  metroNIDAZOLE (FLAGYL) IVPB 500 mg  Status:  Discontinued       ? 500 mg ?100 mL/hr over 60 Minutes  Intravenous Every 12 hours 06/26/21 2048 06/28/21 0752  ? 06/26/21 2100  cefTRIAXone (ROCEPHIN) 2 g in sodium chloride 0.9 % 100 mL IVPB       ? 2 g ?200 mL/hr over 30 Minutes Intravenous Every 24 hours 06/26/21 2048    ? ?  ?  ? ? ?Subjective: ?Seen and examined.  No complaints. ? ?Objective: ?Vitals:  ? 06/28/21 2028 06/29/21 0510 06/29/21 6384 06/29/21 0816  ?BP: 119/72  118/74 117/62  ?Pulse: 73  69 67  ?Resp: '18  18 15  '$ ?Temp: 98.2 ?F (36.8 ?C)  98 ?F (36.7 ?C) (!) 97.5 ?F (36.4 ?C)  ?TempSrc: Oral  Oral Oral  ?SpO2: 100%  98% 99%  ?Weight:  76.7 kg    ?Height:      ? ? ?Intake/Output Summary (Last 24 hours) at 06/29/2021 1416 ?Last data filed at 06/29/2021 1100 ?Gross per 24 hour  ?Intake 2306.78 ml  ?Output --  ?Net 2306.78 ml  ? ? ?Filed Weights  ? 06/26/21 2314 06/28/21 0653 06/29/21 0510  ?Weight: 69.2 kg 73.7 kg 76.7 kg  ? ? ?Examination: ? ?General exam: Appears calm and comfortable  ?Respiratory system: Clear to auscultation. Respiratory effort normal. ?Cardiovascular system: S1 & S2 heard, RRR. No JVD, murmurs, rubs, gallops or clicks. No pedal edema. ?Gastrointestinal system: Abdomen is nondistended, soft and nontender. No organomegaly or masses felt. Normal bowel sounds heard. ?Central nervous system: Alert and oriented. No focal neurological deficits. ?Extremities: Symmetric 5 x 5 power. ?Skin: No rashes, lesions or ulcers.  ?Psychiatry: Judgement and insight appear normal. Mood & affect appropriate.  ? ? ?Data Reviewed: I have personally reviewed following labs and imaging studies ? ?CBC: ?Recent Labs  ?Lab 06/26/21 ?1452 06/27/21 ?0328 06/28/21 ?0421 06/29/21 ?0423  ?WBC 11.5* 8.7 6.3 6.3  ?NEUTROABS 9.2* 5.9  --   --   ?HGB 12.4 9.8* 9.7* 9.9*  ?HCT 37.8 29.7* 29.0* 30.6*  ?MCV 104.4* 104.2* 103.6* 106.3*  ?PLT 191 138* 153 165  ? ? ?Basic Metabolic Panel: ?Recent Labs  ?Lab 06/26/21 ?1452 06/26/21 ?1805 06/27/21 ?0328 06/28/21 ?1447 06/29/21 ?0423  ?NA 137  --  137 138 138  ?K 3.4*  --  2.9* 3.8 3.8   ?CL 103  --  108 111 110  ?CO2 21*  --  20* 18* 17*  ?GLUCOSE 130*  --  98 112* 89  ?BUN 71*  --  66* 59* 57*  ?CREATININE 4.95*  --  4.58* 4.69* 4.92*  ?CALCIUM 8.4*  --  7.7* 8.1* 8.2*  ?MG  --  1.9 1.8  --   --   ?PHOS  --  4.3  --   --   --   ? ? ?GFR: ?Estimated Creatinine Clearance: 8 mL/min (A) (by C-G formula based on SCr of 4.92 mg/dL (H)). ?Liver Function Tests: ?Recent Labs  ?Lab 06/26/21 ?1452 06/27/21 ?0328 06/28/21 ?1447 06/29/21 ?0423  ?AST 10* 6* 9* 9*  ?ALT '11 8 8 8  '$ ?ALKPHOS 113 85 77 72  ?BILITOT 1.3* 0.5 0.2* 0.1*  ?PROT 6.3* 5.0* 5.0* 4.9*  ?  ALBUMIN 2.7* 2.1* 2.1* 2.1*  ? ? ?No results for input(s): LIPASE, AMYLASE in the last 168 hours. ?No results for input(s): AMMONIA in the last 168 hours. ?Coagulation Profile: ?No results for input(s): INR, PROTIME in the last 168 hours. ?Cardiac Enzymes: ?No results for input(s): CKTOTAL, CKMB, CKMBINDEX, TROPONINI in the last 168 hours. ?BNP (last 3 results) ?No results for input(s): PROBNP in the last 8760 hours. ?HbA1C: ?No results for input(s): HGBA1C in the last 72 hours. ?CBG: ?No results for input(s): GLUCAP in the last 168 hours. ?Lipid Profile: ?No results for input(s): CHOL, HDL, LDLCALC, TRIG, CHOLHDL, LDLDIRECT in the last 72 hours. ?Thyroid Function Tests: ?No results for input(s): TSH, T4TOTAL, FREET4, T3FREE, THYROIDAB in the last 72 hours. ?Anemia Panel: ?Recent Labs  ?  06/27/21 ?1210 06/28/21 ?1447  ?[id number] 3,267*  --   ?FOLATE 5.1*  --   ?FERRITIN  --  458*  ?TIBC  --  179*  ?IRON  --  80  ? ? ?Sepsis Labs: ?No results for input(s): PROCALCITON, LATICACIDVEN in the last 168 hours. ? ?No results found for this or any previous visit (from the past 240 hour(s)).  ? ?Radiology Studies: ?MR ABDOMEN MRCP WO CONTRAST ? ?Result Date: 06/27/2021 ?CLINICAL DATA:  Evaluate common bile duct obstruction. EXAM: MRI ABDOMEN WITHOUT CONTRAST  (INCLUDING MRCP) TECHNIQUE: Multiplanar multisequence MR imaging of the abdomen was performed. Heavily  T2-weighted images of the biliary and pancreatic ducts were obtained, and three-dimensional MRCP images were rendered by post processing. COMPARISON:  CT AP 06/26/2021 FINDINGS: At the patient's request the

## 2021-06-29 NOTE — Evaluation (Signed)
ccupational Therapy Evaluation ?Patient Details ?Name: [name] ?MRN: [id number] ?DOB: [birth year] ?Today's Date: 06/29/2021 ? ? ?History of Present Illness 87 year old white female presents to the hospital 3/31 from the PCP office with high creatinine; history of diabetes, history of A-fib on chronic anticoagulation with Xarelto, history of chronic kidney disease stage IIIb followed by [place] [facility], coronary disease, hyperlipidemia  ? ?Clinical Impression ?  ?Pt completes selfcare tasks sit to stand as well as toilet transfers with min guard assist using the rollator for support.  She is alone most of the day as her son works and pt is able to complete simple meal prep as well as toileting and selfcare tasks at modified independent level.  Feel she will benefit from continued acute care OT to progress back to modified independent level for return home.  ?   ? ?Recommendations for follow up therapy are one component of a multi-disciplinary discharge planning process, led by the attending physician.  Recommendations may be updated based on patient status, additional functional criteria and insurance authorization.  ? ?Follow Up Recommendations ? No OT follow up  ?  ?Assistance Recommended at Discharge PRN  ?Patient can return home with the following A little help with walking and/or transfers;A little help with bathing/dressing/bathroom ? ?  ?Functional Status Assessment ? Patient has had a recent decline in their functional status and demonstrates the ability to make significant improvements in function in a reasonable and predictable amount of time.  ?Equipment Recommendations ? None recommended by OT  ?  ?   ?Precautions / Restrictions Precautions ?Precautions: Fall ?Restrictions ?Weight Bearing Restrictions: No  ? ?  ? ?Mobility Bed Mobility ?Overal bed mobility: Independent ?  ?  ?  ?  ?  ?  ?  ?  ? ?Transfers ?Overall transfer level: Needs assistance ?Equipment used: Rollator (4 wheels) ?Transfers: Sit  to/from Stand, Bed to chair/wheelchair/BSC ?Sit to Stand: Min guard ?Stand pivot transfers: Min guard ?  ?  ?  ?  ?General transfer comment: Min guard for short distance mobility with the rollator as well as without. ?  ? ?  ?Balance Overall balance assessment: Needs assistance ?Sitting-balance support: Feet supported ?Sitting balance-Leahy Scale: Good ?  ?  ?Standing balance support: No upper extremity supported, During functional activity ?Standing balance-Leahy Scale: Fair ?  ?  ?  ?  ?  ?  ?  ?  ?  ?  ?  ?  ?   ? ?ADL either performed or assessed with clinical judgement  ? ?ADL Overall ADL's : Needs assistance/impaired ?Eating/Feeding: Independent ?  ?Grooming: Wash/dry hands;Supervision/safety;Standing ?Grooming Details (indicate cue type and reason): simulated with use of the rollator ?Upper Body Bathing: Supervision/ safety;Sitting ?Upper Body Bathing Details (indicate cue type and reason): simulated ?Lower Body Bathing: Sit to/from stand;Min guard ?  ?Upper Body Dressing : Sitting;Minimal assistance;Supervision/safety ?Upper Body Dressing Details (indicate cue type and reason): gown placed like a robe ?Lower Body Dressing: Moderate assistance;Sit to/from stand ?Lower Body Dressing Details (indicate cue type and reason): mod assist including gripper socks ?Toilet Transfer: Engineer, manufacturing (4 wheels) ?Toilet Transfer Details (indicate cue type and reason): simulated ?Toileting- Water quality scientist and Hygiene: Min guard;Sit to/from stand ?Toileting - Clothing Manipulation Details (indicate cue type and reason): simulated ?  ?  ?Functional mobility during ADLs: Rollator (4 wheels) ?General ADL Comments: Pt reports using her rollator on and off at home as well as a cane at times when she goes out.  She also reports having  a bed that the [facility] elevates as well.  An aide comes in once a week to assist with laundry and cleaning, however she can do her own laundry.  Her son and her both cook when needed.   Decreased ability to reach her feet for donning socks, she does have a reacher but is not interested in a sockaide stating, "I never wear them."  ? ? ? ?Vision Baseline Vision/History: 1 Wears glasses ?Ability to See in Adequate Light: 0 Adequate ?Patient Visual Report: No change from baseline ?Vision Assessment?: No apparent visual deficits  ?   ?Perception Perception ?Perception: Within Functional Limits ?  ?Praxis Praxis ?Praxis: Intact ?  ? ?Pertinent Vitals/Pain Pain Assessment ?Pain Assessment: Faces ?Pain Score: 0-No pain  ? ? ? ?Hand Dominance Right ?  ?Extremity/Trunk Assessment Upper Extremity Assessment ?Upper Extremity Assessment: Generalized weakness (pt 3+/5 throughout in BUEs) ?  ?Lower Extremity Assessment ?Lower Extremity Assessment: Defer to PT evaluation ?  ?Cervical / Trunk Assessment ?Cervical / Trunk Assessment: Normal ?  ?Communication Communication ?Communication: No difficulties ?  ?Cognition Arousal/Alertness: Awake/alert ?Behavior During Therapy: [facility] for tasks assessed/performed ?Overall Cognitive Status: Within Functional Limits for tasks assessed ?  ?  ?  ?  ?  ?  ?  ?  ?  ?  ?  ?  ?  ?  ?  ?  ?  ?  ?  ?   ?   ?   ? ? ?Home Living Family/patient expects to be discharged to:: Private residence ?Living Arrangements: Children (lives with son who works) ?Available Help at Discharge: Family;Available PRN/intermittently (son works days) ?Type of Home: House ([place]) ?Home Access: Stairs to enter ?Entrance Stairs-Number of Steps: 1 ?  ?Home Layout: Two level;Bed/bath upstairs (Has a chair lift) ?Alternate Level Stairs-Number of Steps: Flight ?  ?Bathroom Shower/Tub: Walk-in shower ?  ?  ?  ?  ?Home Equipment: Rollator (4 wheels);Shower seat - built in;Grab bars - tub/shower;Adaptive equipment Multimedia programmer upstairs and downstairs; Stair lift) ?Adaptive Equipment: Reacher ?Additional Comments: pt has higher toilet ?  ? ?  ?Prior Functioning/Environment Prior Level of Function :  Independent/Modified Independent ?  ?  ?  ?  ?  ?  ?Mobility Comments: Walks with Rollator; drives, has a stair lift ?ADLs Comments: no difficulty ?  ? ?  ?  ?OT Problem List: Decreased strength;Impaired balance (sitting and/or standing) ?  ?   ?OT Treatment/Interventions: Self-care/ADL training;Therapeutic exercise;DME and/or AE instruction;Cognitive remediation/compensation;Therapeutic activities;Balance training;Patient/family education  ?  ?OT Goals(Current goals can be found in the care plan section) Acute Rehab OT Goals ?Patient Stated Goal: Pt wants to be able to go to the bathroom without having to ask staff to assist. ?OT Goal Formulation: With patient ?Time For Goal Achievement: 07/13/21 ?Potential to Achieve Goals: Good  ?OT Frequency: Min 2X/week ?  ? ?   ?AM-PAC OT "6 Clicks" Daily Activity     ?Outcome Measure Help from another person eating meals?: None ?Help from another person taking care of personal grooming?: None ?Help from another person toileting, which includes using toliet, bedpan, or urinal?: A Little ?Help from another person bathing (including washing, rinsing, drying)?: A Little ?Help from another person to put on and taking off regular upper body clothing?: A Little ?Help from another person to put on and taking off regular lower body clothing?: A Little ?6 Click Score: 20 ?  ?End of Session Equipment Utilized During Treatment: Rollator (4 wheels) ?Nurse Communication: Mobility status ? ?Activity Tolerance:  Patient tolerated treatment well ?Patient left: with call bell/phone within reach;in bed;with bed alarm set ? ?OT Visit Diagnosis: Unsteadiness on feet (R26.81);Muscle weakness (generalized) (M62.81)  ?              ?Time: 7616-0737 ?OT Time Calculation (min): 37 min ?Charges:  OT General Charges ?$OT Visit: 1 Visit ?OT Evaluation ?$OT Eval Moderate Complexity: 1 Mod ?OT Treatments ?$Self Care/Home Management : 8-22 mins ?Densil Ottey OTR/L ?06/29/2021, 11:29 AM ?

## 2021-06-30 ENCOUNTER — Encounter (HOSPITAL_COMMUNITY): Payer: Self-pay | Admitting: Internal Medicine

## 2021-06-30 ENCOUNTER — Inpatient Hospital Stay (HOSPITAL_COMMUNITY): Payer: Medicare Other | Admitting: Anesthesiology

## 2021-06-30 ENCOUNTER — Inpatient Hospital Stay (HOSPITAL_COMMUNITY): Payer: Medicare Other

## 2021-06-30 ENCOUNTER — Encounter (HOSPITAL_COMMUNITY)
Admission: EM | Disposition: A | Discharge status: Home Health | Payer: Self-pay | Source: Home / Self Care | Attending: Family Medicine

## 2021-06-30 DIAGNOSIS — I252 Old myocardial infarction: Secondary | ICD-10-CM

## 2021-06-30 DIAGNOSIS — N1832 Chronic kidney disease, stage 3b: Secondary | ICD-10-CM | POA: Diagnosis not present

## 2021-06-30 DIAGNOSIS — K8050 Calculus of bile duct without cholangitis or cholecystitis without obstruction: Secondary | ICD-10-CM

## 2021-06-30 DIAGNOSIS — I10 Essential (primary) hypertension: Secondary | ICD-10-CM

## 2021-06-30 DIAGNOSIS — N179 Acute kidney failure, unspecified: Secondary | ICD-10-CM | POA: Diagnosis not present

## 2021-06-30 DIAGNOSIS — I2510 Atherosclerotic heart disease of native coronary artery without angina pectoris: Secondary | ICD-10-CM

## 2021-06-30 DIAGNOSIS — K8051 Calculus of bile duct without cholangitis or cholecystitis with obstruction: Secondary | ICD-10-CM

## 2021-06-30 HISTORY — PX: ERCP: SHX5425

## 2021-06-30 HISTORY — PX: SPHINCTEROTOMY: SHX5279

## 2021-06-30 HISTORY — PX: REMOVAL OF STONES: SHX5545

## 2021-06-30 LAB — CBC
HCT: 31 % — ABNORMAL LOW (ref 36.0–46.0)
Hemoglobin: 9.6 g/dL — ABNORMAL LOW (ref 12.0–15.0)
MCH: 33.7 pg (ref 26.0–34.0)
MCHC: 31 g/dL (ref 30.0–36.0)
MCV: 108.8 fL — ABNORMAL HIGH (ref 80.0–100.0)
Platelets: 162 10*3/uL (ref 150–400)
RBC: 2.85 MIL/uL — ABNORMAL LOW (ref 3.87–5.11)
RDW: 14 % (ref 11.5–15.5)
WBC: 4.5 10*3/uL (ref 4.0–10.5)
nRBC: 0 % (ref 0.0–0.2)

## 2021-06-30 LAB — COMPREHENSIVE METABOLIC PANEL
ALT: 8 U/L (ref 0–44)
AST: 10 U/L — ABNORMAL LOW (ref 15–41)
Albumin: 2.1 g/dL — ABNORMAL LOW (ref 3.5–5.0)
Alkaline Phosphatase: 62 U/L (ref 38–126)
Anion gap: 11 (ref 5–15)
BUN: 52 mg/dL — ABNORMAL HIGH (ref 8–23)
CO2: 16 mmol/L — ABNORMAL LOW (ref 22–32)
Calcium: 8.2 mg/dL — ABNORMAL LOW (ref 8.9–10.3)
Chloride: 114 mmol/L — ABNORMAL HIGH (ref 98–111)
Creatinine, Ser: 4.68 mg/dL — ABNORMAL HIGH (ref 0.44–1.00)
GFR, Estimated: 9 mL/min — ABNORMAL LOW (ref >60)
Glucose, Bld: 90 mg/dL (ref 70–99)
Potassium: 3.8 mmol/L (ref 3.5–5.1)
Sodium: 141 mmol/L (ref 135–145)
Total Bilirubin: 0.3 mg/dL (ref 0.3–1.2)
Total Protein: 4.9 g/dL — ABNORMAL LOW (ref 6.5–8.1)

## 2021-06-30 LAB — HEPARIN LEVEL (UNFRACTIONATED): Heparin Unfractionated: 0.52 IU/mL (ref 0.30–0.70)

## 2021-06-30 SURGERY — ERCP, WITH INTERVENTION IF INDICATED
Anesthesia: General

## 2021-06-30 MED ORDER — FOLIC ACID 1 MG PO TABS
1.0000 mg | ORAL_TABLET | Freq: Every day | ORAL | Status: DC
  Administered 2021-07-01: 1 mg via ORAL
  Filled 2021-06-30: qty 1

## 2021-06-30 MED ORDER — ONDANSETRON HCL 4 MG/2ML IJ SOLN
INTRAMUSCULAR | Status: DC | PRN
Start: 2021-06-30 — End: 2021-06-30
  Administered 2021-06-30: 4 mg via INTRAVENOUS

## 2021-06-30 MED ORDER — LIDOCAINE 2% (20 MG/ML) 5 ML SYRINGE
INTRAMUSCULAR | Status: DC | PRN
  Administered 2021-06-30: 40 mg via INTRAVENOUS

## 2021-06-30 MED ORDER — SODIUM CHLORIDE 0.9 % IV SOLN
INTRAVENOUS | Status: DC | PRN
Start: 2021-06-30 — End: 2021-06-30

## 2021-06-30 MED ORDER — ROCURONIUM BROMIDE 10 MG/ML (PF) SYRINGE
PREFILLED_SYRINGE | INTRAVENOUS | Status: DC | PRN
  Administered 2021-06-30: 50 mg via INTRAVENOUS

## 2021-06-30 MED ORDER — FENTANYL CITRATE (PF) 100 MCG/2ML IJ SOLN
INTRAMUSCULAR | Status: AC
  Filled 2021-06-30: qty 2

## 2021-06-30 MED ORDER — INDOMETHACIN 50 MG RE SUPP
RECTAL | Status: DC | PRN
Start: 2021-06-30 — End: 2021-06-30
  Administered 2021-06-30: 100 mg via RECTAL

## 2021-06-30 MED ORDER — FENTANYL CITRATE (PF) 250 MCG/5ML IJ SOLN
INTRAMUSCULAR | Status: DC | PRN
  Administered 2021-06-30: 25 ug via INTRAVENOUS

## 2021-06-30 MED ORDER — PROPOFOL 10 MG/ML IV BOLUS
INTRAVENOUS | Status: DC | PRN
  Administered 2021-06-30: 100 mg via INTRAVENOUS

## 2021-06-30 MED ORDER — PHENYLEPHRINE 40 MCG/ML (10ML) SYRINGE FOR IV PUSH (FOR BLOOD PRESSURE SUPPORT)
PREFILLED_SYRINGE | INTRAVENOUS | Status: DC | PRN
  Administered 2021-06-30 (×3): 120 ug via INTRAVENOUS

## 2021-06-30 MED ORDER — SODIUM BICARBONATE 8.4 % IV SOLN
INTRAVENOUS | Status: AC
  Filled 2021-06-30: qty 1000
  Filled 2021-06-30: qty 150

## 2021-06-30 MED ORDER — DEXAMETHASONE SODIUM PHOSPHATE 10 MG/ML IJ SOLN
INTRAMUSCULAR | Status: DC | PRN
  Administered 2021-06-30: 4 mg via INTRAVENOUS

## 2021-06-30 MED ORDER — SUGAMMADEX SODIUM 200 MG/2ML IV SOLN
INTRAVENOUS | Status: DC | PRN
Start: 2021-06-30 — End: 2021-06-30
  Administered 2021-06-30: 200 mg via INTRAVENOUS

## 2021-06-30 MED ORDER — CIPROFLOXACIN IN D5W 400 MG/200ML IV SOLN
INTRAVENOUS | Status: AC
  Filled 2021-06-30: qty 200

## 2021-06-30 MED ORDER — PHENYLEPHRINE HCL-NACL 20-0.9 MG/250ML-% IV SOLN
INTRAVENOUS | Status: DC | PRN
  Administered 2021-06-30: 50 ug/min via INTRAVENOUS

## 2021-06-30 MED ORDER — SODIUM CHLORIDE 0.9 % IV SOLN
INTRAVENOUS | Status: DC | PRN
  Administered 2021-06-30: 90 mL

## 2021-06-30 MED ORDER — GLUCAGON HCL RDNA (DIAGNOSTIC) 1 MG IJ SOLR
INTRAMUSCULAR | Status: AC
Start: 2021-06-30 — End: ?
  Filled 2021-06-30: qty 1

## 2021-06-30 MED ORDER — POTASSIUM CHLORIDE 20 MEQ PO PACK
20.0000 meq | PACK | Freq: Once | ORAL | Status: DC
  Filled 2021-06-30: qty 1

## 2021-06-30 MED ORDER — INDOMETHACIN 50 MG RE SUPP
RECTAL | Status: AC
Start: 2021-06-30 — End: ?
  Filled 2021-06-30: qty 1

## 2021-06-30 MED ORDER — LACTATED RINGERS IV SOLN
INTRAVENOUS | Status: DC | PRN
Start: 2021-06-30 — End: 2021-06-30

## 2021-06-30 MED ORDER — HEPARIN (PORCINE) 25000 UT/250ML-% IV SOLN
1050.0000 [IU]/h | INTRAVENOUS | Status: DC
  Administered 2021-06-30 – 2021-07-01 (×2): 1050 [IU]/h via INTRAVENOUS
  Filled 2021-06-30: qty 250

## 2021-06-30 MED ORDER — METRONIDAZOLE 500 MG/100ML IV SOLN
500.0000 mg | Freq: Two times a day (BID) | INTRAVENOUS | Status: AC
  Administered 2021-06-30 – 2021-07-01 (×2): 500 mg via INTRAVENOUS
  Filled 2021-06-30 (×2): qty 100

## 2021-06-30 NOTE — Interval H&P Note (Signed)
History and Physical Interval Note: ? ?06/30/2021 ?1:41 PM ? ?[name]  has presented today for surgery, with the diagnosis of Choledocholithiasis.  The various methods of treatment have been discussed with the patient and family. After consideration of risks, benefits and other options for treatment, the patient has consented to  Procedure(s): ?ENDOSCOPIC RETROGRADE CHOLANGIOPANCREATOGRAPHY (ERCP) (N/A) as a surgical intervention.  The patient's history has been reviewed, patient examined, no change in status, stable for surgery.  I have reviewed the patient's chart and labs.  Questions were answered to the patient's satisfaction.   ? ? ?Silvano Rusk ? ? ?

## 2021-06-30 NOTE — Progress Notes (Signed)
Admit: 06/26/2021 ?LOS: 4 ? ?84F AoCKD3/4 in setting of diarrhea, N/V, poor PO, diuretic use and incidental findings of diverticulitis and choledocholithiasis. ? ?Subjective:  ?In ERCP for choledocolithiasis.  Acidosis worsening so changed fluids to bicarb gtt and gave K packet since K 3.8 and anticipate it to decrease with change in IVFs. ? ?04/03 0701 - 04/04 0700 ?In: 880 [P.O.:880] ?Out: 104 [Urine:102; Stool:2] ? ?Filed Weights  ? 06/26/21 2314 06/28/21 0653 06/29/21 0510  ?Weight: 69.2 kg 73.7 kg 76.7 kg  ? ? ?Scheduled Meds: ? [MAR Hold] amiodarone  100 mg Oral Daily  ? [MAR Hold] folic acid  1 mg Oral Daily  ? [MAR Hold] metoprolol tartrate  25 mg Oral BID  ? [MAR Hold] potassium chloride  20 mEq Oral Once  ? ?Continuous Infusions: ? [MAR Hold] cefTRIAXone (ROCEPHIN)  IV 2 g (06/29/21 2351)  ? [MAR Hold] metronidazole    ? sodium bicarbonate 150 mEq in D5W infusion 75 mL/hr at 06/30/21 1025  ? ?PRN Meds:.[MAR Hold] acetaminophen **OR** [MAR Hold] acetaminophen, [MAR Hold] melatonin, [MAR Hold] ondansetron **OR** [MAR Hold] ondansetron (ZOFRAN) IV ? ?Current Labs: reviewed  ? ? ?Physical Exam: see exam from 4/3, pt in ERCP 4/4 Blood pressure (!) 141/70, pulse 66, temperature (!) 97.5 ?F (36.4 ?C), temperature source Oral, resp. rate 15, height '5\' 2"'$  (1.575 m), weight 76.7 kg, SpO2 90 %. ?GEN: Elderly female, NAD, lying flat in bed, breathing comfortably ?ENT: NCAT ?CV: Regular, normal S1 and S2 ?PULM: Clear bilaterally, normal work of breathing ?ABD: Soft but very mild tenderness in the left lower quadrant, NABS, no rebound or guarding ?SKIN: No rashes or lesions ?EXT: No peripheral edema- very thin skin with multiple bruises ? ?A/P ?AoCKD3b: Likely a component of hypovolemia/hemodynamic changes given that creatinine has improved with supportive care from presentation and history suggests change in volume status;  BL SCr around 2.8 04/2021.  Slightly better, switched to bicarb gtt, ordered K  packet ?Rectosigmoid diverticulitis on ceftriaxone and Flagyl, per primary ?Choledocholithiasis with history of cholecystectomy. Getting ERCP 4/4. ?Hypokalemia ?Macrocytic anemia, mild, CTM ?Mild metabolic acidosis- bicarb gtt ?DM2 ?Atrial fibrillation on DOAC--> switched to heparin gtt with procedure ?Nonobstructive nephrolithiasis ? ? ?Madelon Lips MD ?06/30/2021, 4:51 PM ? ?Recent Labs  ?Lab 06/26/21 ?1805 06/27/21 ?0328 06/28/21 ?1447 06/29/21 ?0423 06/30/21 ?0433  ?NA  --    < > 138 138 141  ?K  --    < > 3.8 3.8 3.8  ?CL  --    < > 111 110 114*  ?CO2  --    < > 18* 17* 16*  ?GLUCOSE  --    < > 112* 89 90  ?BUN  --    < > 59* 57* 52*  ?CREATININE  --    < > 4.69* 4.92* 4.68*  ?CALCIUM  --    < > 8.1* 8.2* 8.2*  ?PHOS 4.3  --   --   --   --   ? < > = values in this interval not displayed.  ? ?Recent Labs  ?Lab 06/26/21 ?1452 06/27/21 ?0328 06/28/21 ?0421 06/29/21 ?0423 06/30/21 ?7915  ?WBC 11.5* 8.7 6.3 6.3 4.5  ?NEUTROABS 9.2* 5.9  --   --   --   ?HGB 12.4 9.8* 9.7* 9.9* 9.6*  ?HCT 37.8 29.7* 29.0* 30.6* 31.0*  ?MCV 104.4* 104.2* 103.6* 106.3* 108.8*  ?PLT 191 138* 153 165 162  ? ? ? ? ? ? ? ? ? ?  ?

## 2021-06-30 NOTE — Anesthesia Preprocedure Evaluation (Signed)
Anesthesia Evaluation  ?Patient identified by MRN, date of birth, ID band ?Patient awake ? ? ? ?Reviewed: ?Allergy & Precautions, NPO status , Patient's Chart, lab work & pertinent test results ? ?History of Anesthesia Complications ?(+) PONV and history of anesthetic complications ? ?Airway ?Mallampati: II ? ?TM Distance: >3 FB ? ? ? ? Dental ?  ?Pulmonary ? ?  ?breath sounds clear to auscultation ? ? ? ? ? ? Cardiovascular ?hypertension, + CAD and + Past MI  ? ?Rhythm:Regular Rate:Normal ? ? ?  ?Neuro/Psych ?  ? GI/Hepatic ?GERD  ,History noted ?Dr. Nyoka Cowden ?  ?Endo/Other  ?diabetes ? Renal/GU ?Renal disease  ? ?  ?Musculoskeletal ? ? Abdominal ?  ?Peds ? Hematology ?  ?Anesthesia Other Findings ? ? Reproductive/Obstetrics ? ?  ? ? ? ? ? ? ? ? ? ? ? ? ? ?  ?  ? ? ? ? ? ? ? ? ?Anesthesia Physical ?Anesthesia Plan ? ?ASA: 3 ? ?Anesthesia Plan: General  ? ?Post-op Pain Management:   ? ?Induction: Intravenous ? ?PONV Risk Score and Plan: 4 or greater and Propofol infusion ? ?Airway Management Planned: Oral ETT ? ?Additional Equipment:  ? ?Intra-op Plan:  ? ?Post-operative Plan: Extubation in OR ? ?Informed Consent: I have reviewed the patients History and Physical, chart, labs and discussed the procedure including the risks, benefits and alternatives for the proposed anesthesia with the patient or authorized representative who has indicated his/her understanding and acceptance.  ? ? ? ? ? ?Plan Discussed with: CRNA and Anesthesiologist ? ?Anesthesia Plan Comments:   ? ? ? ? ? ? ?Anesthesia Quick Evaluation ? ?

## 2021-06-30 NOTE — Progress Notes (Signed)
PT Cancellation Note ? ?Patient Details ?Name: [name] ?MRN: [id number] ?DOB: 10-20-1933 ? ? ?Cancelled Treatment:    Reason Eval/Treat Not Completed: Other (comment) ? ?Politely declining PT ths morning; preoccupied with her upcoming ERCP;  ? ?Will follow up later today as time allows;  ?Otherwise, will follow up for PT tomorrow;  ? ?Thank you,  ?Roney Marion, PT  ?Acute Rehabilitation Services ?Pager [phone] ?Office [phone] ? ? ? ?[name] ?06/30/2021, 11:42 AM ?

## 2021-06-30 NOTE — Anesthesia Procedure Notes (Signed)
Procedure Name: Intubation ?Date/Time: 06/30/2021 2:04 PM ?Performed by: Inda Coke, CRNA ?Pre-anesthesia Checklist: Patient identified, Emergency Drugs available, Suction available and Patient being monitored ?Patient Re-evaluated:Patient Re-evaluated prior to induction ?Oxygen Delivery Method: Circle System Utilized ?Preoxygenation: Pre-oxygenation with 100% oxygen ?Induction Type: IV induction ?Ventilation: Mask ventilation without difficulty ?Laryngoscope Size: Mac and 3 ?Grade View: Grade I ?Tube type: Oral ?Tube size: 7.0 mm ?Number of attempts: 1 ?Airway Equipment and Method: Stylet and Oral airway ?Placement Confirmation: ETT inserted through vocal cords under direct vision, positive ETCO2 and breath sounds checked- equal and bilateral ?Secured at: 22 cm ?Tube secured with: Tape ?Dental Injury: Teeth and Oropharynx as per pre-operative assessment  ? ? ? ? ?

## 2021-06-30 NOTE — Anesthesia Postprocedure Evaluation (Signed)
Anesthesia Post Note ? ?[name] ? ?Procedure(s) Performed: ENDOSCOPIC RETROGRADE CHOLANGIOPANCREATOGRAPHY (ERCP) ?SPHINCTEROTOMY ?REMOVAL OF STONES ? ?  ? ?Patient location during evaluation: PACU ?Anesthesia Type: General ?Level of consciousness: awake and alert ?Pain management: pain level controlled ?Vital Signs Assessment: post-procedure vital signs reviewed and stable ?Respiratory status: spontaneous breathing, nonlabored ventilation, respiratory function stable and patient connected to nasal cannula oxygen ?Cardiovascular status: blood pressure returned to baseline and stable ?Postop Assessment: no apparent nausea or vomiting ?Anesthetic complications: no ? ? ?No notable events documented. ? ?Last Vitals:  ?Vitals:  ? 06/30/21 1534 06/30/21 1535  ?BP: (!) 147/60   ?Pulse: 67 66  ?Resp: (!) 21 (!) 21  ?Temp:    ?SpO2: 93% 96%  ?  ?Last Pain:  ?Vitals:  ? 06/30/21 1513  ?TempSrc:   ?PainSc: 0-No pain  ? ? ?  ?  ?  ?  ?  ?  ? ?Audry Pili ? ? ? ? ?

## 2021-06-30 NOTE — Op Note (Signed)
[facility] ?Patient Name: [name] ?Procedure Date : 06/30/2021 ?MRN: [id number] ?Attending MD: Gatha Mayer , MD ?Date of Birth: [birth year] ?CSN: [id number] ?Age: 88 ?Admit Type: Inpatient ?Procedure:                ERCP ?Indications:              Bile duct stone(s), For therapy of bile duct  ?                          stone(s) ?Providers:                Gatha Mayer, MD, Doristine Johns, RN,  ?                          William Dalton, Technician, Gloris Ham,  ?                          Technician ?Referring MD:              ?Medicines:                General Anesthesia, On ceftriaxone and  ?                          metronidazole, Indomethacin 100 mg PR ?Complications:            No immediate complications. ?Estimated Blood Loss:     Estimated blood loss: none. ?Procedure:                Pre-Anesthesia Assessment: ?                          - Prior to the procedure, a History and Physical  ?                          was performed, and patient medications and  ?                          allergies were reviewed. The patient's tolerance of  ?                          previous anesthesia was also reviewed. The risks  ?                          and benefits of the procedure and the sedation  ?                          options and risks were discussed with the patient.  ?                          All questions were answered, and informed consent  ?                          was obtained. Prior Anticoagulants: The patient  ?                          last took heparin on the day  of the procedure. ASA  ?                          Grade Assessment: III - A patient with severe  ?                          systemic disease. After reviewing the risks and  ?                          benefits, the patient was deemed in satisfactory  ?                          condition to undergo the procedure. ?                          After obtaining informed consent, the scope was  ?                           passed under direct vision. Throughout the  ?                          procedure, the patient's blood pressure, pulse, and  ?                          oxygen saturations were monitored continuously. The  ?                          [id number]-[id number] ([id number]) Olympus duodenoscope was  ?                          introduced through the mouth, and used to inject  ?                          contrast into and used to inject contrast into the  ?                          bile duct. The ERCP was accomplished without  ?                          difficulty. The patient tolerated the procedure  ?                          well. ?Scope In: ?Scope Out: ?Findings: ?     A scout film of the abdomen was obtained. Surgical clips, consistent  ?     with a previous cholecystectomy, were seen in the area of the right  ?     upper quadrant of the abdomen. Esophagus not seen well. Stomach grossly  ?     normal. Duodenum w/ NL papilla a bit more proximal than usual.  ?     Cannulated deeply w/o wire. Wire passed and cholangiogram showed CBD  ?     with multiple large stones and max 14 mm duct, mild intrahepatic  ?     dilation with prior cholecystectomy. Biliary sphincterotomy performed  ?     and 12-15 mm balloon  used to sweep out multiple stones. Occlusion  ?     cholangiogram negative. No pancreatogram. ?Impression:               - Choledocholithiasis was found. Complete removal  ?                          was accomplished by biliary sphincterotomy and  ?                          balloon extraction. ?Recommendation:           - Return to floor ?                          Heparin back on at 1800 no bolus ?                          If ok tomorrow home on Xarelto ?                          Stop Abx in Am also if ok ?                          Son updated by phone ?Procedure Code(s):        --- Professional --- ?                          [phone], Endoscopic retrograde  ?                          cholangiopancreatography (ERCP); with removal of  ?                           calculi/debris from biliary/pancreatic duct(s) ?                          [id number], Endoscopic retrograde  ?                          cholangiopancreatography (ERCP); with  ?                          sphincterotomy/papillotomy ?Diagnosis Code(s):        --- Professional --- ?                          K80.50, Calculus of bile duct without cholangitis  ?                          or cholecystitis without obstruction ?CPT copyright 2019 [employer]. All rights reserved. ?The codes documented in this report are preliminary and upon coder review may  ?be revised to meet current compliance requirements. ?Gatha Mayer, MD ?06/30/2021 3:25:52 PM ?This report has been signed electronically. ?Number of Addenda: 0 ?

## 2021-06-30 NOTE — Progress Notes (Signed)
ANTICOAGULATION CONSULT NOTE ? ?Pharmacy Consult for Heparin ?Indication: atrial fibrillation ? ?Allergies  ?Allergen Reactions  ? Dilaudid [Hydromorphone Hcl] Other (See Comments)  ?  Caused patient psychological disturbances   ? Doxycycline Hyclate Other (See Comments)  ?  diarrhea  ? Losartan Other (See Comments)  ?  Per MAR  ? Bactrim [Sulfamethoxazole-Trimethoprim] Rash  ? Elemental Sulfur Rash  ? Pantoprazole Diarrhea  ?  Gave her gas and diarrhea  ? ? ?Patient Measurements: ?Height: '5\' 2"'$  (157.5 cm) ?Weight: 76.7 kg (169 lb 1.5 oz) ?IBW/kg (Calculated) : 50.1 ? ?Heparin Dosing Weight: 65 kg ? ?Vital Signs: ?Temp: 97.5 ?F (36.4 ?C) (04/04 1558) ?Temp Source: Oral (04/04 1558) ?BP: 141/70 (04/04 1558) ?Pulse Rate: 66 (04/04 1558) ? ?Labs: ?Recent Labs  ?  06/28/21 ?0421 06/28/21 ?1447 06/28/21 ?1532 06/29/21 ?0423 06/30/21 ?8110  ?HGB 9.7*  --   --  9.9* 9.6*  ?HCT 29.0*  --   --  30.6* 31.0*  ?PLT 153  --   --  165 162  ?APTT 91*  --   --   --   --   ?HEPARINUNFRC 0.40  --  0.46 0.50 0.52  ?CREATININE  --  4.69*  --  4.92* 4.68*  ? ? ? ?Estimated Creatinine Clearance: 8.4 mL/min (A) (by C-G formula based on SCr of 4.68 mg/dL (H)). ? ? ?Assessment: ?61 YOF p/w AKI and atrial fibrillation on Xarelto PTA (last dose 3/30 1700). Initially planned to switch to Eliquis for her AKI on CKD, but there is possibility she may need an intervention this admission. Pharmacy consulted to manage heparin while DOAC on hold. ? ?Heparin level therapeutic at 0.50 on current heparin rate of 1050 units/hr. CBC stable, platelets 165. No infusion issues reported. No s/sx bleeding reported. ? ?Pt is s/p ERCP. Ok to resume heparin tonight at [employer]. ? ?Goal of Therapy:  ?Heparin level 0.3-0.7 units/ml ?Monitor platelets by anticoagulation protocol: Yes ?  ?Plan:  ?Resume heparin infusion at 1050 units/hr ?Check heparin level daily while on heparin ?Follow-up plans to transition to [place] ?Continue to monitor H&H and platelets ? ? ? ?Onnie Boer, PharmD, BCIDP, AAHIVP, CPP ?Infectious Disease Pharmacist ?06/30/2021 5:24 PM ? ? ? ?

## 2021-06-30 NOTE — Progress Notes (Signed)
?PROGRESS NOTE ? ?[name] ZDG:[id number] DOB: [birth year] DOA: 06/26/2021 ?PCP: Holland Commons, FNP ? ? LOS: 4 days  ? ?Brief Narrative / Interim history: ?88 year old white female history of diabetes, history of A-fib on chronic anticoagulation with Xarelto, history of chronic kidney disease stage IIIb followed by [place] [facility], coronary disease, hyperlipidemia presented to the hospital from the PCP office.  She was going there for lab work for an upcoming Routine appointment at the end of the week.  She was called back with abnormal labs.  Serum creatinine was found to be increased over 5.  She reported a history of 3 weeks of fatigue, dyspnea on exertion.  Nephrology consulted.  During this hospital course she was found to have choledocholithiasis as well as sigmoid diverticulitis, GI consulted ? ?Subjective / 24h Interval events: ?She is doing well this morning.  Denies any chest pain, denies any shortness of breath.  No abdominal pain. ? ?Assesement and Plan: ?Principal Problem: ?  Acute renal failure superimposed on stage 3b chronic kidney disease ([place]) ?Active Problems: ?  Hypertension ?  Diarrhea ?  Atrial fibrillation ([place]) ?  Coronary artery disease involving native coronary artery of native heart without angina pectoris ?  Chronic kidney disease, stage 3b ([place]) ?  Hyperlipidemia ? ? ?Assessment and Plan: ?Principal problem ?AKI on CKD 3B -creatinine is in the 4 range currently remaining increased.  As an outpatient it was around 1.3.  Nephrology consulted, this is likely in the setting of her chronic diarrhea, nausea, vomiting, poor p.o. intake as well as diuretic use.  Continue IV fluids ? ?Active problems ?Acute sigmoid diverticulitis-some minimal abdominal pain but tolerating diet.  Has been placed on antibiotics with Rocephin and Flagyl, continue. ? ?CBD dilatation with choledocholithiasis-seen on MRCP, GI consulted, plan for ERCP this afternoon.  Avoid indomethacin due to acute kidney  injury.  Monitor for post-procedure pancreatitis ?  ?Chronic macrocytic anemia- She has folate deficiency.  Continue folate ?  ?Coronary artery disease involving native coronary artery of native heart without angina pectoris-Stable.  No chest pain. ?  ?Atrial fibrillation (HCC)-continue Lopressor, amiodarone.  Anticoagulation is on hold due to procedure this afternoon.  Resume heparin per GI ? ?Diarrhea-chronic ?  ?Hypertension- Blood pressure controlled. Continue Lopressor. ?  ?Hyperlipidemia- Continue statin. ? ? ?Scheduled Meds: ? amiodarone  100 mg Oral Daily  ? [START ON 12/02/1882] folic acid  1 mg Oral Daily  ? metoprolol tartrate  25 mg Oral BID  ? potassium chloride  20 mEq Oral Once  ? ?Continuous Infusions: ? cefTRIAXone (ROCEPHIN)  IV 2 g (06/29/21 2351)  ? metronidazole    ? sodium bicarbonate 150 mEq in D5W infusion 75 mL/hr at 06/30/21 1025  ? ?PRN Meds:.acetaminophen **OR** acetaminophen, melatonin, ondansetron **OR** ondansetron (ZOFRAN) IV ? ?Diet Orders (From admission, onward)  ? ?  Start     Ordered  ? 06/30/21 0001  Diet NPO time specified Except for: Sips with Meds  Diet effective midnight       ?Question:  Except for  Answer:  Sips with Meds  ? 06/29/21 1116  ? ?  ?  ? ?  ? ? ?DVT prophylaxis: SCDs Start: 06/26/21 2059 ? ? ?Lab Results  ?Component Value Date  ? PLT 162 06/30/2021  ? ? ?  Code Status: DNR ? ?Family Communication: no family at bedside ? ?Status is: Inpatient ? ?Remains inpatient appropriate because: ERCP today ? ?Level of care: Telemetry Medical ? ?Consultants:  ?GI ? ? ?  Objective: ?Vitals:  ? 06/29/21 1648 06/29/21 2021 06/30/21 0459 06/30/21 0817  ?BP: (!) 148/69 135/66 130/81 (!) 149/74  ?Pulse: 70 70 67 69  ?Resp: '15 17 17 15  '$ ?Temp: 97.7 ?F (36.5 ?C) 98 ?F (36.7 ?C) 98.4 ?F (36.9 ?C) 98 ?F (36.7 ?C)  ?TempSrc: Oral Oral  Oral  ?SpO2: 97% 95% 95% 98%  ?Weight:      ?Height:      ? ? ?Intake/Output Summary (Last 24 hours) at 06/30/2021 1155 ?Last data filed at 06/30/2021  0459 ?Gross per 24 hour  ?Intake 540 ml  ?Output 100 ml  ?Net 440 ml  ? ?Wt Readings from Last 3 Encounters:  ?06/29/21 76.7 kg  ?11/18/20 76.4 kg  ?05/05/20 83.5 kg  ? ? ?Examination: ? ?Constitutional: NAD ?Eyes: no scleral icterus ?ENMT: Mucous membranes are moist.  ?Neck: normal, supple ?Respiratory: clear to auscultation bilaterally, no wheezing, no crackles.  ?Cardiovascular: Regular rate and rhythm, no murmurs / rubs / gallops. No LE edema.  ?Abdomen: non distended, no tenderness. Bowel sounds positive.  ?Musculoskeletal: no clubbing / cyanosis.  ?Skin: no rashes ?Neurologic: non focal  ? ? ?Data Reviewed: I have independently reviewed following labs and imaging studies ? ?CBC ?Recent Labs  ?Lab 06/26/21 ?1452 06/27/21 ?0328 06/28/21 ?0421 06/29/21 ?0423 06/30/21 ?2774  ?WBC 11.5* 8.7 6.3 6.3 4.5  ?HGB 12.4 9.8* 9.7* 9.9* 9.6*  ?HCT 37.8 29.7* 29.0* 30.6* 31.0*  ?PLT 191 138* 153 165 162  ?MCV 104.4* 104.2* 103.6* 106.3* 108.8*  ?MCH 34.3* 34.4* 34.6* 34.4* 33.7  ?MCHC 32.8 33.0 33.4 32.4 31.0  ?RDW 13.3 13.4 13.5 13.7 14.0  ?LYMPHSABS 1.6 1.9  --   --   --   ?MONOABS 0.7 0.7  --   --   --   ?EOSABS 0.0 0.1  --   --   --   ?BASOSABS 0.0 0.0  --   --   --   ? ? ?Recent Labs  ?Lab 06/26/21 ?1452 06/26/21 ?1805 06/27/21 ?0328 06/28/21 ?1447 06/29/21 ?0423 06/30/21 ?0433  ?NA 137  --  137 138 138 141  ?K 3.4*  --  2.9* 3.8 3.8 3.8  ?CL 103  --  108 111 110 114*  ?CO2 21*  --  20* 18* 17* 16*  ?GLUCOSE 130*  --  98 112* 89 90  ?BUN 71*  --  66* 59* 57* 52*  ?CREATININE 4.95*  --  4.58* 4.69* 4.92* 4.68*  ?CALCIUM 8.4*  --  7.7* 8.1* 8.2* 8.2*  ?AST 10*  --  6* 9* 9* 10*  ?ALT 11  --  '8 8 8 8  '$ ?ALKPHOS 113  --  85 77 72 62  ?BILITOT 1.3*  --  0.5 0.2* 0.1* 0.3  ?ALBUMIN 2.7*  --  2.1* 2.1* 2.1* 2.1*  ?MG  --  1.9 1.8  --   --   --   ? ? ?------------------------------------------------------------------------------------------------------------------ ?No results for input(s): CHOL, HDL, LDLCALC, TRIG, CHOLHDL,  LDLDIRECT in the last 72 hours. ? ?Lab Results  ?Component Value Date  ? HGBA1C 6.1 (H) 11/03/2017  ? ?------------------------------------------------------------------------------------------------------------------ ?No results for input(s): TSH, T4TOTAL, T3FREE, THYROIDAB in the last 72 hours. ? ?Invalid input(s): FREET3 ? ?Cardiac Enzymes ?No results for input(s): CKMB, TROPONINI, MYOGLOBIN in the last 168 hours. ? ?Invalid input(s): CK ?------------------------------------------------------------------------------------------------------------------ ?   ?Component Value Date/Time  ? BNP 238.4 (H) 11/06/2017 0243  ? ? ?CBG: ?No results for input(s): GLUCAP in the last 168 hours. ? ?No results  found for this or any previous visit (from the past 240 hour(s)).  ? ?Radiology Studies: ?No results found. ? ? ?Marzetta Board, MD, PhD ?[facility] ? ?Between 7 am - 7 pm I am available, please contact me via Amion (for emergencies) or Securechat (non urgent messages) ? ?Between 7 pm - 7 am I am not available, please contact night coverage MD/APP via Amion ? ?

## 2021-06-30 NOTE — Transfer of Care (Signed)
Immediate Anesthesia Transfer of Care Note ? ?Patient: TYRIANNA LIGHTLE ? ?Procedure(s) Performed: ENDOSCOPIC RETROGRADE CHOLANGIOPANCREATOGRAPHY (ERCP) ?SPHINCTEROTOMY ?REMOVAL OF STONES ? ?Patient Location: Endoscopy Unit ? ?Anesthesia Type:General ? ?Level of Consciousness: awake, alert  and oriented ? ?Airway & Oxygen Therapy: Patient Spontanous Breathing and Patient connected to nasal cannula oxygen ? ?Post-op Assessment: Report given to RN and Post -op Vital signs reviewed and stable ? ?Post vital signs: Reviewed and stable ? ?Last Vitals:  ?Vitals Value Taken Time  ?BP 143/66 06/30/21 1513  ?Temp    ?Pulse 66 06/30/21 1516  ?Resp 20 06/30/21 1516  ?SpO2 92 % 06/30/21 1516  ?Vitals shown include unvalidated device data. ? ?Last Pain:  ?Vitals:  ? 06/30/21 1513  ?TempSrc:   ?PainSc: 0-No pain  ?   ? ?  ? ?Complications: No notable events documented. ?

## 2021-07-01 ENCOUNTER — Encounter (HOSPITAL_COMMUNITY): Payer: Self-pay | Admitting: Internal Medicine

## 2021-07-01 DIAGNOSIS — I480 Paroxysmal atrial fibrillation: Secondary | ICD-10-CM

## 2021-07-01 DIAGNOSIS — K5792 Diverticulitis of intestine, part unspecified, without perforation or abscess without bleeding: Secondary | ICD-10-CM | POA: Diagnosis not present

## 2021-07-01 DIAGNOSIS — I2510 Atherosclerotic heart disease of native coronary artery without angina pectoris: Secondary | ICD-10-CM

## 2021-07-01 DIAGNOSIS — K8051 Calculus of bile duct without cholangitis or cholecystitis with obstruction: Secondary | ICD-10-CM

## 2021-07-01 DIAGNOSIS — N1832 Chronic kidney disease, stage 3b: Secondary | ICD-10-CM | POA: Diagnosis not present

## 2021-07-01 DIAGNOSIS — N179 Acute kidney failure, unspecified: Secondary | ICD-10-CM | POA: Diagnosis not present

## 2021-07-01 LAB — COMPREHENSIVE METABOLIC PANEL
ALT: 13 U/L (ref 0–44)
AST: 17 U/L (ref 15–41)
Albumin: 2.3 g/dL — ABNORMAL LOW (ref 3.5–5.0)
Alkaline Phosphatase: 78 U/L (ref 38–126)
Anion gap: 11 (ref 5–15)
BUN: 49 mg/dL — ABNORMAL HIGH (ref 8–23)
CO2: 20 mmol/L — ABNORMAL LOW (ref 22–32)
Calcium: 8.2 mg/dL — ABNORMAL LOW (ref 8.9–10.3)
Chloride: 107 mmol/L (ref 98–111)
Creatinine, Ser: 4.53 mg/dL — ABNORMAL HIGH (ref 0.44–1.00)
GFR, Estimated: 9 mL/min — ABNORMAL LOW (ref >60)
Glucose, Bld: 146 mg/dL — ABNORMAL HIGH (ref 70–99)
Potassium: 4.1 mmol/L (ref 3.5–5.1)
Sodium: 138 mmol/L (ref 135–145)
Total Bilirubin: 0.2 mg/dL — ABNORMAL LOW (ref 0.3–1.2)
Total Protein: 5.3 g/dL — ABNORMAL LOW (ref 6.5–8.1)

## 2021-07-01 LAB — CBC
HCT: 34.6 % — ABNORMAL LOW (ref 36.0–46.0)
Hemoglobin: 10.9 g/dL — ABNORMAL LOW (ref 12.0–15.0)
MCH: 34.4 pg — ABNORMAL HIGH (ref 26.0–34.0)
MCHC: 31.5 g/dL (ref 30.0–36.0)
MCV: 109.1 fL — ABNORMAL HIGH (ref 80.0–100.0)
Platelets: 192 10*3/uL (ref 150–400)
RBC: 3.17 MIL/uL — ABNORMAL LOW (ref 3.87–5.11)
RDW: 14.1 % (ref 11.5–15.5)
WBC: 4.8 10*3/uL (ref 4.0–10.5)
nRBC: 0 % (ref 0.0–0.2)

## 2021-07-01 LAB — HEPARIN LEVEL (UNFRACTIONATED): Heparin Unfractionated: 0.5 IU/mL (ref 0.30–0.70)

## 2021-07-01 MED ORDER — ONDANSETRON HCL 4 MG PO TABS
4.0000 mg | ORAL_TABLET | Freq: Four times a day (QID) | ORAL | 0 refills | Status: AC | PRN
Start: 2021-07-01 — End: ?

## 2021-07-01 MED ORDER — AMOXICILLIN-POT CLAVULANATE 875-125 MG PO TABS: 1.0000 | ORAL_TABLET | Freq: Two times a day (BID) | ORAL | 0 refills | Status: DC

## 2021-07-01 NOTE — TOC Transition Note (Signed)
Transition of Care (TOC) - CM/SW Discharge Note ? ? ?Patient Details  ?Name: [name] ?MRN: [id number] ?Date of Birth: [birth year] ? ?Transition of Care (TOC) CM/SW Contact:  ?Carles Collet, RN ?Phone Number: ?07/01/2021, 11:58 AM ? ? ?Clinical Narrative:    ?Unable to reach patient spoke with son Kasandra Knudsen. ?Discussed [place] services. Referral made to [facility] for [facility] PT, no DME needs. No other TOC needs identified for DC.  ? ?[place], [place])  ?[phone] ? ? ? ?Final next level of care: [place] ?Barriers to Discharge: No Barriers Identified ? ? ?Patient Goals and CMS Choice ?Patient states their goals for this hospitalization and ongoing recovery are:: To return home ?CMS Medicare.gov Compare Post Acute Care list provided to:: Other (Comment Required) ?Choice offered to / list presented to : Adult Children ? ?Discharge Placement ?  ?           ?  ?  ?  ?  ? ?Discharge Plan and Services ?  ?Discharge Planning Services: CM Consult ?Post Acute Care Choice:  (Outpatient PT)          ?DME Arranged: N/A ?DME Agency: NA ?  ?  ?  ?HH Arranged: PT ?[place] Agency: [facility] Torres ?Date HH Agency Contacted: 07/01/21 ?Time [place]: 0272 ?Representative spoke with at [place]: Tommi Rumps ? ?Social Determinants of Health (SDOH) Interventions ?  ? ? ?Readmission Risk Interventions ? ?  06/29/2021  ?  2:22 PM  ?Readmission Risk Prevention Plan  ?Post Dischage Appt Complete  ?Medication Screening Complete  ?Transportation Screening Complete  ? ? ? ? ? ?

## 2021-07-01 NOTE — Progress Notes (Addendum)
? ?       Daily Rounding Note ? ?07/01/2021, 8:23 AM ? LOS: 5 days  ? ?SUBJECTIVE:   ?Chief complaint:  Choledocholithiasis.     ? ?Pt feels fine.   No abd pain.  Hungry.  [place] just arrived to bedside.   Usual loose stools.  Ready to go home.    ? ?OBJECTIVE:        ? Vital signs in last 24 hours:    ?Temp:  [97.5 ?F (36.4 ?C)-98.4 ?F (36.9 ?C)] 98.4 ?F (36.9 ?C) (04/05 0522) ?Pulse Rate:  [63-75] 75 (04/05 0522) ?Resp:  [11-25] 18 (04/05 0522) ?BP: (132-175)/(60-86) 138/71 (04/05 0522) ?SpO2:  [85 %-98 %] 95 % (04/05 0522) ?Weight:  [79.4 kg] 79.4 kg (04/05 0500) ?Last BM Date : 06/28/21 ?Filed Weights  ? 06/28/21 0653 06/29/21 0510 07/01/21 0500  ?Weight: 73.7 kg 76.7 kg 79.4 kg  ? ?General: looks well   ?Heart: RRR ?Chest: no dyspnea or cough ?Abdomen: soft, NT, ND.  Active BS  ?Extremities: no CCE ?Neuro/Psych:  oriented x 3.  Calm.  No deficits or tremors observed ? ?Intake/Output from previous day: ?04/04 0701 - 04/05 0700 ?In: 1278.1 [P.O.:240; I.V.:938.1; IV Piggyback:100] ?Out: 0  ? ?Intake/Output this shift: ?No intake/output data recorded. ? ?Lab Results: ?Recent Labs  ?  06/29/21 ?0423 06/30/21 ?1607 07/01/21 ?0501  ?WBC 6.3 4.5 4.8  ?HGB 9.9* 9.6* 10.9*  ?HCT 30.6* 31.0* 34.6*  ?PLT 165 162 192  ? ?BMET ?Recent Labs  ?  06/29/21 ?0423 06/30/21 ?0433 07/01/21 ?0501  ?NA 138 141 138  ?K 3.8 3.8 4.1  ?CL 110 114* 107  ?CO2 17* 16* 20*  ?GLUCOSE 89 90 146*  ?BUN 57* 52* 49*  ?CREATININE 4.92* 4.68* 4.53*  ?CALCIUM 8.2* 8.2* 8.2*  ? ?LFT ?Recent Labs  ?  06/29/21 ?0423 06/30/21 ?0433 07/01/21 ?0501  ?PROT 4.9* 4.9* 5.3*  ?ALBUMIN 2.1* 2.1* 2.3*  ?AST 9* 10* 17  ?ALT '8 8 13  '$ ?ALKPHOS 72 62 78  ?BILITOT 0.1* 0.3 0.2*  ? ?PT/INR ?No results for input(s): LABPROT, INR in the last 72 hours. ?Hepatitis Panel ?No results for input(s): HEPBSAG, HCVAB, HEPAIGM, HEPBIGM in the last 72 hours. ? ?Studies/Results: ?DG ERCP ? ?Result Date: 06/30/2021 ?CLINICAL DATA:   ERCP for choledocholithiasis EXAM: ERCP TECHNIQUE: Multiple spot images obtained with the fluoroscopic device and submitted for interpretation post-procedure. FLUOROSCOPY: Radiation Exposure Index (as provided by the fluoroscopic device): 83.55 mGy Kerma COMPARISON:  MRCP 06/27/2021 FINDINGS: A total of 8 intraoperative spot images demonstrate a flexible duodenal scope in the descending duodenum. Balloon occluded cholangiography demonstrates a dilated common duct with multiple small filling defects consistent with choledocholithiasis. Subsequent images demonstrate balloon sweeping of the common duct. IMPRESSION: 1. Choledocholithiasis. 2. ERCP with balloon sweeping of the common duct. These images were submitted for radiologic interpretation only. Please see the procedural report for the amount of contrast and the fluoroscopy time utilized. Electronically Signed   By: Jacqulynn Cadet M.D.   On: 06/30/2021 15:37   ? ?Scheduled Meds: ? amiodarone  100 mg Oral Daily  ? folic acid  1 mg Oral Daily  ? metoprolol tartrate  25 mg Oral BID  ? potassium chloride  20 mEq Oral Once  ? ?Continuous Infusions: ? heparin 1,050 Units/hr (07/01/21 0550)  ? metronidazole Stopped (07/01/21 0015)  ? sodium bicarbonate 150 mEq in D5W infusion 75 mL/hr at 06/30/21 1025  ? ?PRN Meds:.acetaminophen **OR** acetaminophen, melatonin, ondansetron **OR** ondansetron (ZOFRAN) IV  ? ?  ASSESMENT:  ? ?  Choledocholithiasis, non-obstructive/asymptomatic.  4/4 ERCP: Dr Carlean Purl removed/balloon extracted CBD stones after sphincterotomy.  Previous cholecystectomy ? ?  Chronic Eliquis, on hold.  Hx afib.   ? ?  Diverticulitis at recto/sigmoid, uncomplicated.  Completed 5 d Rocephin.  Day 6 IV Flagyl.   ? ?  Acute on chronic kidney dz.  Non obstructive nephrolithiasis.   ? ?  Macrocytic anemia.  Colon 2016, EGD 2019, VCE 2019. Chronic po iron at home ? ?  Longstanding, non-infectious, non microscopic, non IBD diarrhea.   ? ? ?PLAN  ? ?  Ok to resume  Eliquis and d/c home.  Fup w GI prn.  Unless there is a contraindication, ok to use Imodium prn. ? ?  ? Need for ESA agent going forward?  ? ? ? ?[name]  07/01/2021, 8:23 AM ?Phone [phone]  ?

## 2021-07-01 NOTE — Plan of Care (Signed)
?  Problem: Education: ?Goal: Knowledge of General Education information will improve ?Description: Including pain rating scale, medication(s)/side effects and non-pharmacologic comfort measures ?07/01/2021 1711 by Vira Agar, RN ?Outcome: Completed/Met ?07/01/2021 1711 by Vira Agar, RN ?Outcome: Completed/Met ?  ?Problem: Health Behavior/Discharge Planning: ?Goal: Ability to manage health-related needs will improve ?07/01/2021 1711 by Vira Agar, RN ?Outcome: Completed/Met ?07/01/2021 1711 by Vira Agar, RN ?Outcome: Completed/Met ?  ?Problem: Clinical Measurements: ?Goal: Ability to maintain clinical measurements within normal limits will improve ?07/01/2021 1711 by Vira Agar, RN ?Outcome: Completed/Met ?07/01/2021 1711 by Vira Agar, RN ?Outcome: Completed/Met ?Goal: Will remain free from infection ?07/01/2021 1711 by Vira Agar, RN ?Outcome: Completed/Met ?07/01/2021 1711 by Vira Agar, RN ?Outcome: Completed/Met ?Goal: Diagnostic test results will improve ?07/01/2021 1711 by Vira Agar, RN ?Outcome: Completed/Met ?07/01/2021 1711 by Vira Agar, RN ?Outcome: Completed/Met ?Goal: Respiratory complications will improve ?Outcome: Completed/Met ?Goal: Cardiovascular complication will be avoided ?Outcome: Completed/Met ?  ?Problem: Activity: ?Goal: Risk for activity intolerance will decrease ?Outcome: Completed/Met ?  ?Problem: Nutrition: ?Goal: Adequate nutrition will be maintained ?Outcome: Completed/Met ?  ?Problem: Coping: ?Goal: Level of anxiety will decrease ?Outcome: Completed/Met ?  ?Problem: Elimination: ?Goal: Will not experience complications related to bowel motility ?Outcome: Completed/Met ?Goal: Will not experience complications related to urinary retention ?Outcome: Completed/Met ?  ?Problem: Pain Managment: ?Goal: General experience of comfort will improve ?Outcome: Completed/Met ?  ?Problem: Safety: ?Goal: Ability to remain free from injury will improve ?Outcome:  Completed/Met ?  ?Problem: Skin Integrity: ?Goal: Risk for impaired skin integrity will decrease ?Outcome: Completed/Met ?  ?

## 2021-07-01 NOTE — Progress Notes (Signed)
Physical Therapy Treatment ?Patient Details ?Name: [name] ?MRN: [id number] ?DOB: [birth year] ?Today's Date: 07/01/2021 ? ? ?History of Present Illness 88 year old white female presents to the hospital 3/31 from the PCP office with high creatinine; history of diabetes, history of A-fib on chronic anticoagulation with Xarelto, history of chronic kidney disease stage IIIb followed by [place] [facility], coronary disease, hyperlipidemia ? ?  ?PT Comments  ? ? Continuing work on functional mobility and activity tolerance;  Session focused on progressive ambulation with her own Rollator RW in prep for going home; Overall pt managed household distance well with her Rollator, including using rollator to take a seated rest break in the hallway; Encouraged OOB activity, and pt told me she plans on walking in the hallway with her family when they come later today; OK for dc home from PT standpoint   ?Recommendations for follow up therapy are one component of a multi-disciplinary discharge planning process, led by the attending physician.  Recommendations may be updated based on patient status, additional functional criteria and insurance authorization. ? ?Follow Up Recommendations ? Home health PT (worth considering; on initial PT eval, pt gave the impression that she is not homebound, however today, she tells me she will not be out in the community for quite a while) ?  ?  ?Assistance Recommended at Discharge PRN  ?Patient can return home with the following Assist for transportation ?  ?Equipment Recommendations ? None recommended by PT (quite well-equipped)  ?  ?Recommendations for Other Services   ? ? ?  ?Precautions / Restrictions Precautions ?Precautions: Fall ?Precaution Comments: fall risk is present, but minimal, and reduced further with use of Rollator RW  ?  ? ?Mobility ? Bed Mobility ?Overal bed mobility: Modified Independent ?  ?  ?  ?  ?  ?  ?General bed mobility comments: HOB elevated; pt has an adjustable  bed at home ?  ? ?Transfers ?Overall transfer level: Needs assistance ?Equipment used: Rollator (4 wheels) ?Transfers: Sit to/from Stand ?Sit to Stand: Min guard (without physical contact) ?  ?  ?  ?  ?  ?General transfer comment: [place] for safety; stood from bed, and correctly turned and sat down to Rollator (after brakes were set) for a seated rest break in the hallway; Stood well from [place] as well ?  ? ?Ambulation/Gait ?Ambulation/Gait assistance: Min guard (with and without physical contact ) ?Gait Distance (Feet): 90 Feet (with one seated rest break) ?Assistive device: Rollator (4 wheels) ?Gait Pattern/deviations: Step-through pattern, Decreased step length - right, Decreased step length - left ?Gait velocity: slowed ?  ?  ?General Gait Details: Cues to self-monitor for activity tolerance ; [place] for safety and for IV pole management ? ? ?Stairs ?  ?  ?  ?  ?  ? ? ?Wheelchair Mobility ?  ? ?Modified Rankin (Stroke Patients Only) ?  ? ? ?  ?Balance   ?  ?Sitting balance-Leahy Scale: Good ?  ?  ?  ?Standing balance-Leahy Scale: Fair ?  ?  ?  ?  ?  ?  ?  ?  ?  ?  ?  ?  ?  ? ?  ?Cognition Arousal/Alertness: Awake/alert ?Behavior During Therapy: [facility] for tasks assessed/performed ?Overall Cognitive Status: Within Functional Limits for tasks assessed ?  ?  ?  ?  ?  ?  ?  ?  ?  ?  ?  ?  ?  ?  ?  ?  ?General Comments: very talkative ?  ?  ? ?  ?  Exercises   ? ?  ?General Comments General comments (skin integrity, edema, etc.): Pt is very concerned with keeping IV close during mobility ?  ?  ? ?Pertinent Vitals/Pain Pain Assessment ?Pain Assessment: Faces ?Faces Pain Scale: Hurts a little bit ?Pain Location: sore throat ?Pain Descriptors / Indicators: Sore ?Pain Intervention(s): Monitored during session  ? ? ?Home Living   ?  ?  ?  ?  ?  ?  ?  ?  ?  ?   ?  ?Prior Function    ?  ?  ?   ? ?PT Goals (current goals can now be found in the care plan section) Acute Rehab PT Goals ?Patient Stated Goal: Hopefully  home soon ?PT Goal Formulation: With patient ?Time For Goal Achievement: 07/12/21 ?Potential to Achieve Goals: Good ?Progress towards PT goals: Progressing toward goals ? ?  ?Frequency ? ? ? Min 3X/week ? ? ? ?  ?PT Plan Discharge plan needs to be updated;Other (comment) (Can consider HHPT)  ? ? ?Co-evaluation   ?  ?  ?  ?  ? ?  ?AM-PAC PT "6 Clicks" Mobility   ?Outcome Measure ? Help needed turning from your back to your side while in a flat bed without using bedrails?: None ?Help needed moving from lying on your back to sitting on the side of a flat bed without using bedrails?: None ?Help needed moving to and from a bed to a chair (including a wheelchair)?: None ?Help needed standing up from a chair using your arms (e.g., wheelchair or bedside chair)?: None ?Help needed to walk in hospital room?: A Little ?Help needed climbing 3-5 steps with a railing? : A Lot ?6 Click Score: 21 ? ?  ?End of Session   ?Activity Tolerance: Patient tolerated treatment well ?Patient left: in bed;with call bell/phone within reach ?Nurse Communication: Mobility status ?PT Visit Diagnosis: Unsteadiness on feet (R26.81) ?  ? ? ?Time: 8177-1165 ?PT Time Calculation (min) (ACUTE ONLY): 23 min ? ?Charges:  $Gait Training: 23-37 mins          ?          ? ?Roney Marion, PT  ?Acute Rehabilitation Services ?Pager [phone] ?Office [phone] ? ? ? ?[name] ?07/01/2021, 10:51 AM ? ?

## 2021-07-01 NOTE — Progress Notes (Signed)
ANTICOAGULATION CONSULT NOTE ? ?Pharmacy Consult for Heparin ?Indication: atrial fibrillation ? ?Allergies  ?Allergen Reactions  ? Dilaudid [Hydromorphone Hcl] Other (See Comments)  ?  Caused patient psychological disturbances   ? Doxycycline Hyclate Other (See Comments)  ?  diarrhea  ? Losartan Other (See Comments)  ?  Per MAR  ? Bactrim [Sulfamethoxazole-Trimethoprim] Rash  ? Elemental Sulfur Rash  ? Pantoprazole Diarrhea  ?  Gave her gas and diarrhea  ? ? ?Patient Measurements: ?Height: '5\' 2"'$  (157.5 cm) ?Weight: 79.4 kg (175 lb 0.7 oz) ?IBW/kg (Calculated) : 50.1 ? ?Heparin Dosing Weight: 65 kg ? ?Vital Signs: ?Temp: 98.2 ?F (36.8 ?C) (04/05 8299) ?Temp Source: Oral (04/05 3716) ?BP: 143/93 (04/05 0836) ?Pulse Rate: 87 (04/05 0836) ? ?Labs: ?Recent Labs  ?  06/29/21 ?0423 06/30/21 ?0433 07/01/21 ?0501  ?HGB 9.9* 9.6* 10.9*  ?HCT 30.6* 31.0* 34.6*  ?PLT 165 162 192  ?HEPARINUNFRC 0.50 0.52 0.50  ?CREATININE 4.92* 4.68* 4.53*  ? ? ? ?Estimated Creatinine Clearance: 8.9 mL/min (A) (by C-G formula based on SCr of 4.53 mg/dL (H)). ? ? ?Assessment: ?56 YOF p/w AKI and hx atrial fibrillation on Xarelto PTA (last dose 3/30 1700). Patient was transitioned to heparin infusion from MRCP > ERCP.  Now s/p stone extraction.  Heparin resumed 4/4 PM.  Heparin level is therapeutic on 1050 units/hr.   ? ?CrCl (using TBW) is currently calculated at 11 ml/min.  Xarelto is contraindicated with CrCl <15.  Depending on renal function recovery, would recommend switch to Eliquis for her progression of CKD.   ? ?Goal of Therapy:  ?Heparin level 0.3-0.7 units/ml ?Monitor platelets by anticoagulation protocol: Yes ?  ?Plan:  ?Continue heparin infusion at 1050 units/hr ?Check heparin level daily while on heparin ?Follow-up plans to transition to [place] ?Continue to monitor H&H and platelets ? ?[employer], Pharm.D., BCPS ?Clinical Pharmacist ? ?**Pharmacist phone directory can be found on [email or web address] listed under [place]. ? ?07/01/2021  9:36 AM ? ? ?

## 2021-07-01 NOTE — Therapy (Signed)
Occupational Therapy Treatment ?Patient Details ?Name: [name] ?MRN: [id number] ?DOB: Nov 15, 1933 ?Today's Date: 07/01/2021 ? ? ?History of present illness 88 year old white female presents to the hospital 3/31 from the PCP office with high creatinine; history of diabetes, history of A-fib on chronic anticoagulation with Xarelto, history of chronic kidney disease stage IIIb followed by [place] [facility], coronary disease, hyperlipidemia ?  ?OT comments ? Pt received alert finishing her lunch. Pt with limited willingness to participate in treatment as she plans to d/c home later today when her son can pick her up. She did report ambulating in hall using rollator. She appears very concerned with maintaining IV access and pole so reports she has self limited mobility during admission. Pt did demonstrate fair UE and LE strength by repositioning self in bed with cues from OT for use of bed features (she has an adjustable bed at home). She tolerated this activity well with no concerns for unstable vitals or fatigue. Pt and OT discussing a variety of modifications to self care routine at home to minimize risk of falling as well as for energy conservation. Will continue to follow acutely if remains in house to further progress goals. OT d/c recommendations continue to be appropriate.  ? ?Recommendations for follow up therapy are one component of a multi-disciplinary discharge planning process, led by the attending physician.  Recommendations may be updated based on patient status, additional functional criteria and insurance authorization. ?   ?Follow Up Recommendations ? No OT follow up  ?  ?Assistance Recommended at Discharge PRN  ?Patient can return home with the following ? A little help with walking and/or transfers;A little help with bathing/dressing/bathroom ?  ?Equipment Recommendations ? None recommended by OT  ?  ?Recommendations for Other Services   ? ?  ?Precautions / Restrictions Precautions ?Precautions:  Fall ?Precaution Comments: fall risk is present, but minimal, and reduced further with use of Rollator RW  ? ? ?  ? ?Mobility Bed Mobility ?Overal bed mobility: Needs Assistance ?Bed Mobility: Supine to Sit (posterior supine scoot) ?  ?  ?Supine to sit: Modified independent (Device/Increase time) ?  ?  ?General bed mobility comments: pt received "scrunched" down in bed with feet touching foot board, lowered HOB to allow pt to use bed features to pull self up in bed while using LEs to push completing posterior supine scoot, HOB elevated to increase pt comfort ?  ? ?Transfers ?Overall transfer level:  (deferred OOB activity as she plans to d/c later today, did walk in halls earlier today) ?  ?  ?  ? ?  ?  ?   ? ?ADL either performed or assessed with clinical judgement  ? ?ADL   ?  ?  ?Grooming: Wash/dry hands;Wash/dry face;Bed level ?Grooming Details (indicate cue type and reason): discussed a variety of modifications to performing ADLs at home to ensure safety and minimize risk of falls, pt demonstrating fair insight into these strategies and reports her son is available to assist as needed ?  ?  ?  ?  ?  ?  ?  ? ? ? ?Cognition Arousal/Alertness: Awake/alert ?Behavior During Therapy: [facility] for tasks assessed/performed ?Overall Cognitive Status: Within Functional Limits for tasks assessed ?  ?  ?  ?  ?  ?  ?  ?General Comments: very talkative ?  ?  ?   ?   ?   ?General Comments Pt is very concerned with keeping IV close during mobility  ? ? ?Pertinent Vitals/ Pain  Pain Assessment ?Pain Assessment: No/denies pain ? ? ?Frequency ? Min 2X/week  ? ? ? ? ?  ?Progress Toward Goals ? ?OT Goals(current goals can now be found in the care plan section) ? Progress towards OT goals: Progressing toward goals ? ?   ?Plan Discharge plan remains appropriate   ? ?   ?AM-PAC OT "6 Clicks" Daily Activity     ?Outcome Measure ? ? Help from another person eating meals?: None ?Help from another person taking care of personal grooming?:  None ?Help from another person toileting, which includes using toliet, bedpan, or urinal?: A Little ?Help from another person bathing (including washing, rinsing, drying)?: A Little ?Help from another person to put on and taking off regular upper body clothing?: A Little ?Help from another person to put on and taking off regular lower body clothing?: A Little ?6 Click Score: 20 ? ?  ?End of Session   ? ?OT Visit Diagnosis: Unsteadiness on feet (R26.81);Muscle weakness (generalized) (M62.81) ?  ?Activity Tolerance Patient tolerated treatment well ?  ?Patient Left in bed;with call bell/phone within reach ?  ?   ? ?   ? ?Time: 9833-8250 ?OT Time Calculation (min): 30 min ? ?Charges: OT General Charges ?$OT Visit: 1 Visit ?OT Treatments ?$Self Care/Home Management : 23-37 mins ? ? ? ?Kasandra Knudsen, OTR/L ?07/01/2021, 1:44 PM ? ? ?

## 2021-07-01 NOTE — Progress Notes (Addendum)
Admit: 06/26/2021 ?LOS: 5 ? ?89F AoCKD3/4 in setting of diarrhea, N/V, poor PO, diuretic use and incidental findings of diverticulitis and choledocholithiasis. ? ?Subjective:  ?S/p ERCP with stone extraction, feeling much better.  IVFs stopped.  Ready to eat lunch.   ? ?04/04 0701 - 04/05 0700 ?In: 1278.1 [P.O.:240; I.V.:938.1; IV Piggyback:100] ?Out: 0  ? ?Filed Weights  ? 06/28/21 0653 06/29/21 0510 07/01/21 0500  ?Weight: 73.7 kg 76.7 kg 79.4 kg  ? ? ?Scheduled Meds: ? amiodarone  100 mg Oral Daily  ? folic acid  1 mg Oral Daily  ? metoprolol tartrate  25 mg Oral BID  ? potassium chloride  20 mEq Oral Once  ? ?Continuous Infusions: ? heparin 1,050 Units/hr (07/01/21 0550)  ? ?PRN Meds:.acetaminophen **OR** acetaminophen, melatonin, ondansetron **OR** ondansetron (ZOFRAN) IV ? ?Current Labs: reviewed  ? ? ?Physical Exam: Blood pressure (!) 143/93, pulse 87, temperature 98.2 ?F (36.8 ?C), temperature source Oral, resp. rate 15, height _0  (1.575 m), weight 79.4 kg, SpO2 93 %. ?GEN: Elderly female, NAD, lying flat in bed, brighter affect today and smiling ?ENT: NCAT ?CV: Regular, normal S1 and S2 ?PULM: Clear bilaterally, normal work of breathing ?ABD: Soft, no tenderness ?SKIN: No rashes or lesions ?EXT: very trace peripheral edema- very thin skin with multiple bruises ? ?A/P ?AoCKD3b: Likely a component of hypovolemia/hemodynamic changes given that creatinine has improved with supportive care from presentation and history suggests change in volume status;  BL SCr around 2.8 04/2021.  Cr downtrending, now off IVFs.  She is very eager to go home.  She follows with Dr Joylene Grapes.  If she can eat lunch well today can go home with labs withour office Friday and appt next week.  I will help arrange.  Addend: office closed Friday- will do labs Monday with appt that week. ?Rectosigmoid diverticulitis on ceftriaxone and Flagyl, per primary ?Choledocholithiasis with history of cholecystectomy. S/p ERCP 4/4 with stone  extraction ?Hypokalemia ?Macrocytic anemia, mild, CTM ?Mild metabolic acidosis- bicarb gtt ?DM2 ?Atrial fibrillation on DOAC--> switched to heparin gtt with proceduree.  Recommend consideration of alternative DOAC than Xarelto- her eGFR 04/2021 was likely under 30.  She could do dose-reduced Eliquis 2.5 BID. ?Nonobstructive nephrolithiasis ? ? ? ?Madelon Lips MD ?07/01/2021, 12:30 PM ? ?Recent Labs  ?Lab 06/26/21 ?1805 06/27/21 ?0328 06/29/21 ?0423 06/30/21 ?5631 07/01/21 ?0501  ?NA  --    < > 138 141 138  ?K  --    < > 3.8 3.8 4.1  ?CL  --    < > 110 114* 107  ?CO2  --    < > 17* 16* 20*  ?GLUCOSE  --    < > 89 90 146*  ?BUN  --    < > 57* 52* 49*  ?CREATININE  --    < > 4.92* 4.68* 4.53*  ?CALCIUM  --    < > 8.2* 8.2* 8.2*  ?PHOS 4.3  --   --   --   --   ? < > = values in this interval not displayed.  ? ?Recent Labs  ?Lab 06/26/21 ?1452 06/27/21 ?0328 06/28/21 ?0421 06/29/21 ?0423 06/30/21 ?4970 07/01/21 ?0501  ?WBC 11.5* 8.7   < > 6.3 4.5 4.8  ?NEUTROABS 9.2* 5.9  --   --   --   --   ?HGB 12.4 9.8*   < > 9.9* 9.6* 10.9*  ?HCT 37.8 29.7*   < > 30.6* 31.0* 34.6*  ?MCV 104.4* 104.2*   < >  106.3* 108.8* 109.1*  ?PLT 191 138*   < > 165 162 192  ? < > = values in this interval not displayed.  ? ? ? ? ? ? ? ? ? ?  ?

## 2021-07-01 NOTE — Progress Notes (Signed)
DISCHARGE NOTE HOME ?[name] to be discharged Home per MD order. Discussed prescriptions and follow up appointments with the patient. Prescriptions given to patient; medication list explained in detail. Patient verbalized understanding. ? ?Skin clean, dry and intact without evidence of skin break down, no evidence of skin tears noted. IV catheter discontinued intact. Site without signs and symptoms of complications. Dressing and pressure applied. Pt denies pain at the site currently. No complaints noted. ? ?Patient free of lines, drains, and wounds.  ? ?An After Visit Summary (AVS) was printed and given to the patient. ?Patient escorted via wheelchair, and discharged home via private auto. ? ?Vira Agar, RN  ?

## 2021-07-01 NOTE — Discharge Summary (Signed)
?Physician Discharge Summary ?  ?[name] MRN: [id number] DOB: May 11, 1933  ?Admit date:     06/26/2021  ?Discharge date: 07/01/21  ?Discharge Physician: Jalayne Ganesh  ? ?PCP: Holland Commons, FNP  ? ?Recommendations at discharge:  ? ?Hold torsemide ?Follow-up BMET for renal function. ? ?Discharge Diagnoses: ? ?  Acute renal failure superimposed on stage 3b chronic kidney disease ([place]) ?Acute sigmoid diverticulitis ?CBD dilatation with choledocholithiasis status post ERCP ?Chronic microcytic anemia ?    Hypertension ?Chronic diarrhea ?  Atrial fibrillation ([place]) ?  Coronary artery disease involving native coronary artery of native heart without angina pectoris ?  Chronic kidney disease, stage 3b ([place]) ?  Hyperlipidemia ?  Calculus of bile duct without cholecystitis with obstruction ? ?Hospital Course: ? ?88 year old white female history of diabetes, history of A-fib on chronic anticoagulation with Xarelto, history of chronic kidney disease stage IIIb followed by [place] [facility], coronary disease, hyperlipidemia presented to the hospital from the PCP office.  She was going there for lab work for an upcoming Routine appointment at the end of the week.  She was called back with abnormal labs.  Serum creatinine was found to be increased over 5.  She reported a history of 3 weeks of fatigue, dyspnea on exertion.  Nephrology consulted.  During this hospital course she was found to have choledocholithiasis as well as sigmoid diverticulitis, GI was consulted ? ? ?Assessment and Plan: ?* Acute renal failure superimposed on stage 3b chronic kidney disease ([place]) ?-Presented with a creatinine of 4.95, outpatient baseline around 2.8.  She has known CKD stage IIIb, follows Dr. Joylene Grapes.  Creatinine was 5 at PCPs office. ?-Nephrology was consulted.  MRCP did not show any hydronephrosis or obstruction ?-She was placed on IV fluid hydration.  She had CT urogram that demonstrated bilateral nonobstructive stones  however also revealed diverticulitis at the rectosigmoid junction and choledocholithiasis with mildly distended CBD. ?-Creatinine has plateaued and slowly trending down, 4.53 ?-Discussed with nephrology, Dr Hollie Salk as creatinine is trending down and patient is tolerating diet, cleared to be discharged home with follow-up labs on Friday.  Will hold torsemide for now. ? ? ?Acute sigmoid diverticulitis ?-Minimal abdominal pain, diet advanced to soft solids, tolerating. ?-Patient received IV antibiotics while inpatient, per GI no further antibiotics needed ? ?CBD dilatation with choledocholithiasis ?-Seen on MRCP, GI was consulted. ?-Underwent ERCP, complete removal was accomplished by biliary sphincterotomy and balloon extraction ?-Resumed Xarelto at discharge (discussed with pharmacy, okay to continue as creatinine is improving). ? ?Coronary artery disease involving native coronary artery of native heart without angina pectoris ?Stable.  No chest pain. ? ?Atrial fibrillation ([place]) ? - Continue Lopressor and amiodarone.  ?- she was placed on IV heparin drip while inpatient.  Will resume Xarelto at the current dose as creatinine continues to improve.  If on the repeat labs, creatinine is worsened, may need to switch to eliquis ? ?Diarrhea ?Patient states that she has had chronic diarrhea.  This is not new. ? ?Hypertension ?Continue Lopressor. ? ?Hyperlipidemia ?Continue statin. ? ? ? ? ?  ? ?Pain control - [employer] Controlled Substance Reporting System database was reviewed. and patient was instructed, not to drive, operate heavy machinery, perform activities at heights, swimming or participation in water activities or provide baby-sitting services while on Pain, Sleep and Anxiety Medications; until their outpatient Physician has advised to do so again. Also recommended to not to take more than prescribed Pain, Sleep and Anxiety Medications.  ?Consultants: GI, nephrology ?  Procedures performed: ERCP with balloon  sphincterotomy and removal of CBD stones ?Disposition: Home ?Diet recommendation:  ?Discharge Diet Orders (From admission, onward)  ? ?  Start     Ordered  ? 07/01/21 0000  Diet - low sodium heart healthy       ? 07/01/21 1110  ? ?  ?  ? ?  ? ?Cardiac diet ?DISCHARGE MEDICATION: ?Allergies as of 07/01/2021   ? ?   Reactions  ? Dilaudid [hydromorphone Hcl] Other (See Comments)  ? Caused patient psychological disturbances   ? Doxycycline Hyclate Other (See Comments)  ? diarrhea  ? Losartan Other (See Comments)  ? Per MAR  ? Bactrim [sulfamethoxazole-trimethoprim] Rash  ? Elemental Sulfur Rash  ? Pantoprazole Diarrhea  ? Gave her gas and diarrhea  ? ?  ? ?  ?Medication List  ?  ? ?STOP taking these medications   ? ?betamethasone dipropionate 0.05 % cream ?  ?methocarbamol 500 MG tablet ?Commonly known as: ROBAXIN ?  ?potassium chloride 10 MEQ tablet ?Commonly known as: KLOR-CON ?  ?torsemide 20 MG tablet ?Commonly known as: DEMADEX ?  ? ?  ? ?TAKE these medications   ? ?amiodarone 200 MG tablet ?Commonly known as: PACERONE ?Take 1/2 (one-half) tablet by mouth once daily ?What changed: See the new instructions. ?  ?dorzolamide 2 % ophthalmic solution ?Commonly known as: TRUSOPT ?Place 1 drop into the left eye 2 (two) times daily. (0800 & 1900) ?  ?Dupixent 300 MG/2ML Sopn ?Generic drug: Dupilumab ?Inject 300 mg into the skin every 14 (fourteen) days. ?  ?ferrous sulfate 325 (65 FE) MG EC tablet ?Take 1 tablet (325 mg total) by mouth 2 (two) times daily. ?  ?ketoconazole 2 % cream ?Commonly known as: NIZORAL ?Apply 1 application. topically 2 (two) times daily as needed for irritation. ?  ?latanoprost 0.005 % ophthalmic solution ?Commonly known as: XALATAN ?Place 1 drop into the left eye at bedtime. (1900) ?  ?metoprolol tartrate 25 MG tablet ?Commonly known as: LOPRESSOR ?Take 1 tablet (25 mg total) by mouth 2 (two) times daily. ?  ?nitroGLYCERIN 0.4 MG SL tablet ?Commonly known as: NITROSTAT ?DISSOLVE ONE TABLET UNDER THE  TONGUE EVERY 5 MINUTES AS NEEDED FOR CHEST PAIN.  DO NOT EXCEED A TOTAL OF 3 DOSES IN 15 MINUTES NOW ?What changed: See the new instructions. ?  ?ondansetron 4 MG tablet ?Commonly known as: ZOFRAN ?Take 1 tablet (4 mg total) by mouth every 6 (six) hours as needed for nausea. ?  ?PreserVision AREDS 2 Caps ?Take 1 capsule by mouth 2 (two) times daily. ?  ?Rivaroxaban 15 MG Tabs tablet ?Commonly known as: Xarelto ?TAKE 1 TABLET BY MOUTH ONCE DAILY AFTER SUPPER ?What changed:  ?how much to take ?how to take this ?when to take this ?additional instructions ?  ?rosuvastatin 5 MG tablet ?Commonly known as: CRESTOR ?Take 5 mg by mouth at bedtime. (2000) ?  ?SYSTANE BALANCE OP ?Apply 1 drop to eye 4 (four) times daily as needed (dry eyes). ?  ?triamcinolone cream 0.1 % ?Commonly known as: KENALOG ?Apply 1 application. topically 2 (two) times daily. ?  ?vitamin B-12 500 MCG tablet ?Commonly known as: CYANOCOBALAMIN ?Take 1 tablet (500 mcg total) by mouth daily. ?  ? ?  ? ? Follow-up Information   ? ? Holland Commons, FNP. Schedule an appointment as soon as possible for a visit in 1 week(s).   ?Specialty: Internal Medicine ?Why: Please get the labs done on Friday, 07/03/2021 for renal function  and follow-up within 1 week ?Contact information: ?[place] [address] ?[phone] ? ? ?  ?  ? ? Reesa Chew, MD. Schedule an appointment as soon as possible for a visit in 1 week(s).   ?Specialty: Internal Medicine ?Why: for hospital follow-up, obtain labs, BMET ?Contact information: ?[address] ?[place] [address] ?[phone] ? ? ?  ?  ? ?  ?  ? ?  ? ?Discharge Exam: ?Filed Weights  ? 06/28/21 0653 06/29/21 0510 07/01/21 0500  ?Weight: 73.7 kg 76.7 kg 79.4 kg  ? ?S tolerating diet, wants to go home.  No abdominal pain nausea vomiting ?Vitals:  ? 06/30/21 2053 07/01/21 0500 07/01/21 0522 07/01/21 0836  ?BP: (!) 141/86  138/71 (!) 143/93  ?Pulse: 71  75 87  ?Resp: '17  18 15  '$ ?Temp: 98.4 ?F  (36.9 ?C)  98.4 ?F (36.9 ?C) 98.2 ?F (36.8 ?C)  ?TempSrc:    Oral  ?SpO2: 95%  95% 93%  ?Weight:  79.4 kg    ?Height:      ?  ?Physical Exam ?General: Alert and oriented x 3, NAD ?Cardiovascular: S1 S2 clear

## 2021-07-09 DIAGNOSIS — I10 Essential (primary) hypertension: Secondary | ICD-10-CM | POA: Diagnosis not present

## 2021-07-09 DIAGNOSIS — N184 Chronic kidney disease, stage 4 (severe): Secondary | ICD-10-CM | POA: Diagnosis not present

## 2021-07-09 DIAGNOSIS — Z0000 Encounter for general adult medical examination without abnormal findings: Secondary | ICD-10-CM | POA: Diagnosis not present

## 2021-07-10 DIAGNOSIS — H40011 Open angle with borderline findings, low risk, right eye: Secondary | ICD-10-CM | POA: Diagnosis not present

## 2021-07-10 DIAGNOSIS — H401121 Primary open-angle glaucoma, left eye, mild stage: Secondary | ICD-10-CM | POA: Diagnosis not present

## 2021-07-14 DIAGNOSIS — N200 Calculus of kidney: Secondary | ICD-10-CM | POA: Diagnosis not present

## 2021-07-14 DIAGNOSIS — D631 Anemia in chronic kidney disease: Secondary | ICD-10-CM | POA: Diagnosis not present

## 2021-07-14 DIAGNOSIS — N189 Chronic kidney disease, unspecified: Secondary | ICD-10-CM | POA: Diagnosis not present

## 2021-07-14 DIAGNOSIS — I129 Hypertensive chronic kidney disease with stage 1 through stage 4 chronic kidney disease, or unspecified chronic kidney disease: Secondary | ICD-10-CM | POA: Diagnosis not present

## 2021-07-14 DIAGNOSIS — I4891 Unspecified atrial fibrillation: Secondary | ICD-10-CM | POA: Diagnosis not present

## 2021-07-14 DIAGNOSIS — E785 Hyperlipidemia, unspecified: Secondary | ICD-10-CM | POA: Diagnosis not present

## 2021-07-14 DIAGNOSIS — E559 Vitamin D deficiency, unspecified: Secondary | ICD-10-CM | POA: Diagnosis not present

## 2021-07-14 DIAGNOSIS — N390 Urinary tract infection, site not specified: Secondary | ICD-10-CM | POA: Diagnosis not present

## 2021-07-14 DIAGNOSIS — N184 Chronic kidney disease, stage 4 (severe): Secondary | ICD-10-CM | POA: Diagnosis not present

## 2021-07-14 DIAGNOSIS — N179 Acute kidney failure, unspecified: Secondary | ICD-10-CM | POA: Diagnosis not present

## 2021-07-20 DIAGNOSIS — N185 Chronic kidney disease, stage 5: Secondary | ICD-10-CM | POA: Diagnosis not present

## 2021-07-20 DIAGNOSIS — E7800 Pure hypercholesterolemia, unspecified: Secondary | ICD-10-CM | POA: Diagnosis not present

## 2021-07-20 DIAGNOSIS — I4891 Unspecified atrial fibrillation: Secondary | ICD-10-CM | POA: Diagnosis not present

## 2021-07-20 DIAGNOSIS — R609 Edema, unspecified: Secondary | ICD-10-CM | POA: Diagnosis not present

## 2021-07-20 DIAGNOSIS — H3530 Unspecified macular degeneration: Secondary | ICD-10-CM | POA: Diagnosis not present

## 2021-07-20 DIAGNOSIS — I10 Essential (primary) hypertension: Secondary | ICD-10-CM | POA: Diagnosis not present

## 2021-07-20 DIAGNOSIS — Z0000 Encounter for general adult medical examination without abnormal findings: Secondary | ICD-10-CM | POA: Diagnosis not present

## 2021-08-04 ENCOUNTER — Other Ambulatory Visit: Payer: Self-pay | Admitting: Cardiology

## 2021-08-04 DIAGNOSIS — I10 Essential (primary) hypertension: Secondary | ICD-10-CM

## 2021-08-26 DIAGNOSIS — N184 Chronic kidney disease, stage 4 (severe): Secondary | ICD-10-CM | POA: Diagnosis not present

## 2021-08-26 DIAGNOSIS — I129 Hypertensive chronic kidney disease with stage 1 through stage 4 chronic kidney disease, or unspecified chronic kidney disease: Secondary | ICD-10-CM | POA: Diagnosis not present

## 2021-08-26 DIAGNOSIS — I4891 Unspecified atrial fibrillation: Secondary | ICD-10-CM | POA: Diagnosis not present

## 2021-08-26 DIAGNOSIS — N200 Calculus of kidney: Secondary | ICD-10-CM | POA: Diagnosis not present

## 2021-08-26 DIAGNOSIS — K8050 Calculus of bile duct without cholangitis or cholecystitis without obstruction: Secondary | ICD-10-CM | POA: Diagnosis not present

## 2021-08-26 DIAGNOSIS — N2581 Secondary hyperparathyroidism of renal origin: Secondary | ICD-10-CM | POA: Diagnosis not present

## 2021-09-03 DIAGNOSIS — N184 Chronic kidney disease, stage 4 (severe): Secondary | ICD-10-CM | POA: Diagnosis not present

## 2021-10-06 DIAGNOSIS — L304 Erythema intertrigo: Secondary | ICD-10-CM | POA: Diagnosis not present

## 2021-10-06 DIAGNOSIS — L2089 Other atopic dermatitis: Secondary | ICD-10-CM | POA: Diagnosis not present

## 2021-10-08 DIAGNOSIS — I4891 Unspecified atrial fibrillation: Secondary | ICD-10-CM | POA: Diagnosis not present

## 2021-10-08 DIAGNOSIS — N200 Calculus of kidney: Secondary | ICD-10-CM | POA: Diagnosis not present

## 2021-10-08 DIAGNOSIS — K8050 Calculus of bile duct without cholangitis or cholecystitis without obstruction: Secondary | ICD-10-CM | POA: Diagnosis not present

## 2021-10-08 DIAGNOSIS — N2581 Secondary hyperparathyroidism of renal origin: Secondary | ICD-10-CM | POA: Diagnosis not present

## 2021-10-08 DIAGNOSIS — N184 Chronic kidney disease, stage 4 (severe): Secondary | ICD-10-CM | POA: Diagnosis not present

## 2021-10-08 DIAGNOSIS — I129 Hypertensive chronic kidney disease with stage 1 through stage 4 chronic kidney disease, or unspecified chronic kidney disease: Secondary | ICD-10-CM | POA: Diagnosis not present

## 2021-11-12 DIAGNOSIS — I129 Hypertensive chronic kidney disease with stage 1 through stage 4 chronic kidney disease, or unspecified chronic kidney disease: Secondary | ICD-10-CM | POA: Diagnosis not present

## 2021-11-12 DIAGNOSIS — R110 Nausea: Secondary | ICD-10-CM | POA: Diagnosis not present

## 2021-11-12 DIAGNOSIS — E872 Acidosis, unspecified: Secondary | ICD-10-CM | POA: Diagnosis not present

## 2021-11-12 DIAGNOSIS — D631 Anemia in chronic kidney disease: Secondary | ICD-10-CM | POA: Diagnosis not present

## 2021-11-12 DIAGNOSIS — N390 Urinary tract infection, site not specified: Secondary | ICD-10-CM | POA: Diagnosis not present

## 2021-11-12 DIAGNOSIS — N2581 Secondary hyperparathyroidism of renal origin: Secondary | ICD-10-CM | POA: Diagnosis not present

## 2021-11-12 DIAGNOSIS — R300 Dysuria: Secondary | ICD-10-CM | POA: Diagnosis not present

## 2021-11-12 DIAGNOSIS — N184 Chronic kidney disease, stage 4 (severe): Secondary | ICD-10-CM | POA: Diagnosis not present

## 2021-11-17 ENCOUNTER — Ambulatory Visit: Payer: Medicare Other | Admitting: Cardiology

## 2021-11-26 ENCOUNTER — Ambulatory Visit: Payer: Medicare Other | Admitting: Cardiology

## 2021-12-03 ENCOUNTER — Ambulatory Visit: Payer: Medicare Other | Admitting: Cardiology

## 2021-12-03 ENCOUNTER — Encounter: Payer: Self-pay | Admitting: Cardiology

## 2021-12-03 VITALS — BP 153/89 | HR 91 | Temp 97.8°F | Resp 16 | Ht 62.0 in | Wt 142.0 lb

## 2021-12-03 DIAGNOSIS — I10 Essential (primary) hypertension: Secondary | ICD-10-CM | POA: Diagnosis not present

## 2021-12-03 DIAGNOSIS — I4821 Permanent atrial fibrillation: Secondary | ICD-10-CM | POA: Diagnosis not present

## 2021-12-03 DIAGNOSIS — I2510 Atherosclerotic heart disease of native coronary artery without angina pectoris: Secondary | ICD-10-CM | POA: Diagnosis not present

## 2021-12-03 DIAGNOSIS — I5032 Chronic diastolic (congestive) heart failure: Secondary | ICD-10-CM | POA: Diagnosis not present

## 2021-12-03 NOTE — Progress Notes (Unsigned)
Primary Physician/Referring:  Holland Commons, FNP  Patient ID: [name], female    DOB: [birth year], 88 y.o.   MRN: [id number]  No chief complaint on file.  HPI:    HPI: [name]  is a 88 y.o. Caucasian female with coronary artery disease and LAD stenting in 2012, permanent atrial fibrillation  on Amio due to difficulty in rate control, failed cardioversion on 11/02/17 when she was admitted with symptomatic anemia from erosive gastritis, needed blood transfusion.  Again admitted on 01/21/18 with severe anemia with a hemoglobin of 5.0.  Again no obvious GI etiology was found, felt to be due to severe iron deficiency anemia.  Past medical history significant for hyperlipidemia,  stage IIIb chronic kidney disease, and hypertensoin, severe degenerative disc disease, chronic back pain, arthritis.  Patient recently had visit with nephrology, serum creatinine has significantly improved from 4.8 and is back to her baseline around 1.78.  There was some discussion regarding dialysis.  However patient does not want to do this.  She is now living with her son, has been extremely watchful with her diet.  She presents here for 106-monthoffice visit. She has had any recurrence of GI bleed and hemoglobin has been stable.  She has no specific complaints today.  Past Medical History:  Diagnosis Date  . Acalculous cholecystitis   . Arthritis   . Blood transfusion without reported diagnosis   . Cataract    bilateral-removed from both eyes  . Coronary artery disease    mi  7/12,  stress test 12/21/2011=>negative for ischemia ([name]  . Diabetes mellitus    pt states she is not diabetic  . Diverticulitis   . Dyslipidemia   . GERD (gastroesophageal reflux disease)   . Glaucoma   . Hypertension   . Hypokalemia   . Iron deficiency anemia due to chronic blood loss from small bowel AVM's 01/22/2018  . Mitral regurgitation    mild to moderate, cardiac echo 12/02/2011, EF 51% ([name]   . [place] [place]    [place]  . PONV (postoperative nausea and vomiting)    yrs ago   Past Surgical History:  Procedure Laterality Date  . ABDOMINAL HYSTERECTOMY    . BILIARY DRAINAGE  9/12  . BIOPSY  11/04/2017   Procedure: BIOPSY;  Surgeon: GJackquline Denmark MD;  [facility];  Service: Endoscopy;;  . CARDIOVERSION N/A 02/12/2015   Procedure: CARDIOVERSION;  Surgeon: JAdrian Prows MD;  [place]  Service: Cardiovascular;  Laterality: N/A;  . CARDIOVERSION N/A 11/10/2017   Procedure: CARDIOVERSION;  Surgeon: PNigel Mormon MD;  [facility];  Service: Cardiovascular;  Laterality: N/A;  . CATARACT EXTRACTION     2  . CHOLECYSTECTOMY  02/25/2011   Procedure: LAPAROSCOPIC CHOLECYSTECTOMY WITH INTRAOPERATIVE CHOLANGIOGRAM;  Surgeon: EGayland Curry MD;  [place] [facility]  Service: General;  Laterality: N/A;  . CORONARY ANGIOPLASTY WITH STENT PLACEMENT  09/24/10   drug eluting stents  . ERCP N/A 06/30/2021   Procedure: ENDOSCOPIC RETROGRADE CHOLANGIOPANCREATOGRAPHY (ERCP);  Surgeon: GGatha Mayer MD;  [place]  Service: Gastroenterology;  Laterality: N/A;  . ESOPHAGOGASTRODUODENOSCOPY (EGD) WITH PROPOFOL N/A 11/04/2017   Procedure: ESOPHAGOGASTRODUODENOSCOPY (EGD) WITH PROPOFOL;  Surgeon: GJackquline Denmark MD;  [facility]  Service: Endoscopy;  Laterality: N/A;  . GIVENS CAPSULE STUDY N/A 01/22/2018   Procedure: GIVENS CAPSULE STUDY;  Surgeon: GGatha Mayer MD;  [place]  Service: Endoscopy;  Laterality: N/A;  . PARATHYROIDECTOMY  tumor excision  . REMOVAL OF STONES  06/30/2021   Procedure: REMOVAL OF STONES;  Surgeon: Gatha Mayer, MD;  Location: [place];  Service: Gastroenterology;;  . SHOULDER SURGERY     right  . SIGMOIDOSCOPY     flex sig in the 80's per pt.  [name]  06/30/2021   Procedure: SPHINCTEROTOMY;  Surgeon: Gatha Mayer, MD;  [facility];  Service:  Gastroenterology;;   Social History   Tobacco Use  . Smoking status: Never  . Smokeless tobacco: Never  Substance Use Topics  . Alcohol use: Yes    Alcohol/week: 0.0 standard drinks of alcohol    Comment: occ   Marital Status: Widowed   ROS  Review of Systems  Cardiovascular:  Negative for chest pain, dyspnea on exertion and leg swelling.   Objective  Blood pressure (!) 153/89, pulse 91, temperature 97.8 F (36.6 C), temperature source Temporal, resp. rate 16, height '5\' 2"'  (1.575 m), weight 142 lb (64.4 kg), SpO2 96 %. Body mass index is 25.97 kg/m.      12/03/2021    3:53 PM 07/01/2021    4:28 PM 07/01/2021    8:36 AM  Vitals with BMI  Height '5\' 2"'     Weight 142 lbs    BMI 70.78    Systolic 675 449 201  Diastolic 89 93 93  Pulse 91 78 87      Physical Exam Constitutional:      Appearance: She is well-developed.  Neck:     Thyroid: No thyromegaly.     Vascular: No carotid bruit or JVD.  Cardiovascular:     Rate and Rhythm: Normal rate. Rhythm irregularly irregular.     Pulses: Intact distal pulses.          Carotid pulses are 2+ on the right side and 2+ on the left side.      Femoral pulses are 2+ on the right side and 2+ on the left side.      Popliteal pulses are 0 on the right side and 0 on the left side.       Dorsalis pedis pulses are 1+ on the right side and 1+ on the left side.     Heart sounds: No murmur heard.    No gallop.  Pulmonary:     Effort: Pulmonary effort is normal.     Breath sounds: Normal breath sounds.  Abdominal:     General: Bowel sounds are normal.     Palpations: Abdomen is soft.  Musculoskeletal:     Right lower leg: No edema.     Left lower leg: No edema.   Radiology: No results found.  Laboratory examination:   External labs:  Labs 11/17/2021:  Serum glucose 100 and milligrams, BUN 31, creatinine 1.78, EGFR 30 mL, potassium 4.5.  Urinalysis 1+ protein.  Labs 09/04/2020:  A1c 5.9%.  Iron studies within normal limits.   Hb 11.9/HCT 36.1, platelets 198.  Sodium 137, potassium 3.9, BUN 34, creatinine 1.99, EGFR 22 mL.  Labs 05/14/2020:   Hb 11.8/HCT 36.0, platelets 204.   Serum glucose '1 1 1 ' mg, BUN 29, creatinine 1.75, EGFR 26 mL, potassium 4.3.   A1c 5.7%.   Total cholesterol 162, triglycerides 60, HDL 102, LDL 48.   Medications   Current Outpatient Medications:  .  amiodarone (PACERONE) 200 MG tablet, Take 1/2 (one-half) tablet by mouth once daily (Patient taking differently: Take 100 mg by mouth daily.), Disp: 50 tablet, Rfl: 3 .  dorzolamide (TRUSOPT) 2 % ophthalmic solution, Place 1 drop into the left eye 2 (two) times daily. (0800 & 1900), Disp: , Rfl:  .  DUPIXENT 300 MG/2ML SOPN, Inject 300 mg into the skin every 14 (fourteen) days., Disp: , Rfl:  .  ketoconazole (NIZORAL) 2 % cream, Apply 1 application. topically 2 (two) times daily as needed for irritation., Disp: , Rfl:  .  latanoprost (XALATAN) 0.005 % ophthalmic solution, Place 1 drop into the left eye at bedtime. (1900), Disp: , Rfl: 11 .  metoprolol tartrate (LOPRESSOR) 25 MG tablet, Take 1 tablet by mouth twice daily, Disp: 180 tablet, Rfl: 0 .  Multiple Vitamins-Minerals (PRESERVISION AREDS 2) CAPS, Take 1 capsule by mouth 2 (two) times daily., Disp: , Rfl:  .  nitroGLYCERIN (NITROSTAT) 0.4 MG SL tablet, DISSOLVE ONE TABLET UNDER THE TONGUE EVERY 5 MINUTES AS NEEDED FOR CHEST PAIN.  DO NOT EXCEED A TOTAL OF 3 DOSES IN 15 MINUTES NOW (Patient taking differently: Place 0.4 mg under the tongue every 5 (five) minutes as needed for chest pain.), Disp: 25 tablet, Rfl: 0 .  ondansetron (ZOFRAN) 4 MG tablet, Take 1 tablet (4 mg total) by mouth every 6 (six) hours as needed for nausea., Disp: 20 tablet, Rfl: 0 .  Propylene Glycol (SYSTANE BALANCE OP), Apply 1 drop to eye 4 (four) times daily as needed (dry eyes). , Disp: , Rfl:  .  Rivaroxaban (XARELTO) 15 MG TABS tablet, TAKE 1 TABLET BY MOUTH ONCE DAILY AFTER SUPPER (Patient taking differently:  Take 15 mg by mouth daily with supper.), Disp: 30 tablet, Rfl: 11 .  rosuvastatin (CRESTOR) 5 MG tablet, Take 5 mg by mouth at bedtime. (2000), Disp: , Rfl:  .  torsemide (DEMADEX) 20 MG tablet, Take 1 tablet by mouth every other day., Disp: , Rfl:  .  triamcinolone cream (KENALOG) 0.1 %, Apply 1 application. topically 2 (two) times daily., Disp: , Rfl:  .  vitamin B-12 (CYANOCOBALAMIN) 500 MCG tablet, Take 1 tablet (500 mcg total) by mouth daily., Disp: 30 tablet, Rfl: 0 .  ferrous sulfate 325 (65 FE) MG EC tablet, Take 1 tablet (325 mg total) by mouth 2 (two) times daily., Disp: 60 tablet, Rfl: 3   No orders of the defined types were placed in this encounter.  There are no discontinued medications.   Cardiac Studies:    Heart catheterization 09/24/10: PCI to proximal and mid LAD with 4x18 and 3.5x9 Resolute DES, D1 3.0x12 Resolute stent, Kissing balloon angioplasty. Normal circumflex and RCA. Normal LVEF.  TEE 01/27/2018:  Normal LV systolic function, EF 73-53% without wall motion abnormality.  Mild mitral annular calcification and mild MR.  Mild TR and mild pulmonary hypertension.  EKG:  EKG 11/18/2020: Atrial fibrillation with controlled ventricular response at rate of 75 bpm, normal axis. right bundle branch block.Inferior ischemia No significant change from  EKG 05/05/2020.  Assessment     ICD-10-CM   1. Coronary artery disease involving native coronary artery of native heart without angina pectoris  I25.10     2. Permanent atrial fibrillation (HCC)  I48.21     3. Chronic diastolic (congestive) heart failure (HCC)  I50.32     4. Essential hypertension  I10       CHA2DS2-VASc Score is 6.  Yearly risk of stroke: 9.8% (A, F, HTN, CAD).  Score of 1=0.6; 2=2.2; 3=3.2; 4=4.8; 5=7.2; 6=9.8; 7=>9.8) -(CHF; HTN; vasc disease DM,  Female = 1; Age <65 =0; 65-74 = 1,  >75 =2;  stroke/embolism= 2).   No orders of the defined types were placed in this encounter.  There are no discontinued  medications.   No orders of the defined types were placed in this encounter.     Recommendations:   MADDYN LIEURANCE  is a 87 y.o. Caucasian female with coronary artery disease and LAD stenting in 2012, permanent atrial fibrillation  on Amio due to difficulty in rate control, failed cardioversion on 11/02/17 when she was admitted with symptomatic anemia from erosive gastritis, needed blood transfusion.  Again admitted on 01/21/18 with severe anemia with a hemoglobin of 5.0.  Again no obvious GI etiology was found, felt to be due to severe iron deficiency anemia.  Past medical history significant for hyperlipidemia,  stage IIIb chronic kidney disease, and hypertensoin, severe degenerative disc disease, chronic back pain, arthritis.  Patient recently had visit with nephrology, serum creatinine has significantly improved from 4.8 and is back to her baseline around 1.78.  There was some discussion regarding dialysis.  However patient does not want to do this.  In view of markedly elevated cardioembolic risk, she is still on Eliquis and is tolerating this well.  External labs reviewed.  She persistent atrial fibrillation rate controlled.  Her blood pressure is mildly elevated however I did not make any changes to her medication as she has recently improved with regard to renal dysfunction, she was at stage V with a EGFR of 89 mL.  She is now back to baseline.   From cardiac standpoint she has not had any angina pectoris as well.  Lipids are well controlled.  I will see her back in a year.    [name], MD, [facility] 12/03/2021, 4:32 PM Office: [phone]

## 2022-01-07 DIAGNOSIS — H401121 Primary open-angle glaucoma, left eye, mild stage: Secondary | ICD-10-CM | POA: Diagnosis not present

## 2022-01-07 DIAGNOSIS — H40011 Open angle with borderline findings, low risk, right eye: Secondary | ICD-10-CM | POA: Diagnosis not present

## 2022-01-22 DIAGNOSIS — N185 Chronic kidney disease, stage 5: Secondary | ICD-10-CM | POA: Diagnosis not present

## 2022-01-22 DIAGNOSIS — E7800 Pure hypercholesterolemia, unspecified: Secondary | ICD-10-CM | POA: Diagnosis not present

## 2022-01-22 DIAGNOSIS — I10 Essential (primary) hypertension: Secondary | ICD-10-CM | POA: Diagnosis not present

## 2022-01-25 DIAGNOSIS — N185 Chronic kidney disease, stage 5: Secondary | ICD-10-CM | POA: Diagnosis not present

## 2022-01-25 DIAGNOSIS — E7800 Pure hypercholesterolemia, unspecified: Secondary | ICD-10-CM | POA: Diagnosis not present

## 2022-01-25 DIAGNOSIS — Z7901 Long term (current) use of anticoagulants: Secondary | ICD-10-CM | POA: Diagnosis not present

## 2022-01-25 DIAGNOSIS — I10 Essential (primary) hypertension: Secondary | ICD-10-CM | POA: Diagnosis not present

## 2022-01-25 DIAGNOSIS — E119 Type 2 diabetes mellitus without complications: Secondary | ICD-10-CM | POA: Diagnosis not present

## 2022-01-25 DIAGNOSIS — I2510 Atherosclerotic heart disease of native coronary artery without angina pectoris: Secondary | ICD-10-CM | POA: Diagnosis not present

## 2022-01-25 DIAGNOSIS — I4891 Unspecified atrial fibrillation: Secondary | ICD-10-CM | POA: Diagnosis not present

## 2022-01-25 DIAGNOSIS — E559 Vitamin D deficiency, unspecified: Secondary | ICD-10-CM | POA: Diagnosis not present

## 2022-01-25 DIAGNOSIS — Z23 Encounter for immunization: Secondary | ICD-10-CM | POA: Diagnosis not present

## 2022-02-03 DIAGNOSIS — I4891 Unspecified atrial fibrillation: Secondary | ICD-10-CM | POA: Diagnosis not present

## 2022-02-03 DIAGNOSIS — N186 End stage renal disease: Secondary | ICD-10-CM | POA: Diagnosis not present

## 2022-02-03 DIAGNOSIS — I120 Hypertensive chronic kidney disease with stage 5 chronic kidney disease or end stage renal disease: Secondary | ICD-10-CM | POA: Diagnosis not present

## 2022-02-03 DIAGNOSIS — N2581 Secondary hyperparathyroidism of renal origin: Secondary | ICD-10-CM | POA: Diagnosis not present

## 2022-02-03 DIAGNOSIS — E872 Acidosis, unspecified: Secondary | ICD-10-CM | POA: Diagnosis not present

## 2022-02-10 DIAGNOSIS — E785 Hyperlipidemia, unspecified: Secondary | ICD-10-CM | POA: Diagnosis not present

## 2022-02-10 DIAGNOSIS — I051 Rheumatic mitral insufficiency: Secondary | ICD-10-CM | POA: Diagnosis not present

## 2022-02-10 DIAGNOSIS — N186 End stage renal disease: Secondary | ICD-10-CM | POA: Diagnosis not present

## 2022-02-10 DIAGNOSIS — I252 Old myocardial infarction: Secondary | ICD-10-CM | POA: Diagnosis not present

## 2022-02-10 DIAGNOSIS — E559 Vitamin D deficiency, unspecified: Secondary | ICD-10-CM | POA: Diagnosis not present

## 2022-02-10 DIAGNOSIS — D631 Anemia in chronic kidney disease: Secondary | ICD-10-CM | POA: Diagnosis not present

## 2022-02-10 DIAGNOSIS — H409 Unspecified glaucoma: Secondary | ICD-10-CM | POA: Diagnosis not present

## 2022-02-10 DIAGNOSIS — K219 Gastro-esophageal reflux disease without esophagitis: Secondary | ICD-10-CM | POA: Diagnosis not present

## 2022-02-10 DIAGNOSIS — I120 Hypertensive chronic kidney disease with stage 5 chronic kidney disease or end stage renal disease: Secondary | ICD-10-CM | POA: Diagnosis not present

## 2022-02-10 DIAGNOSIS — H04129 Dry eye syndrome of unspecified lacrimal gland: Secondary | ICD-10-CM | POA: Diagnosis not present

## 2022-02-10 DIAGNOSIS — L299 Pruritus, unspecified: Secondary | ICD-10-CM | POA: Diagnosis not present

## 2022-02-10 DIAGNOSIS — E1122 Type 2 diabetes mellitus with diabetic chronic kidney disease: Secondary | ICD-10-CM | POA: Diagnosis not present

## 2022-02-10 DIAGNOSIS — R634 Abnormal weight loss: Secondary | ICD-10-CM | POA: Diagnosis not present

## 2022-02-10 DIAGNOSIS — I25119 Atherosclerotic heart disease of native coronary artery with unspecified angina pectoris: Secondary | ICD-10-CM | POA: Diagnosis not present

## 2022-02-10 DIAGNOSIS — I4891 Unspecified atrial fibrillation: Secondary | ICD-10-CM | POA: Diagnosis not present

## 2022-02-11 DIAGNOSIS — N186 End stage renal disease: Secondary | ICD-10-CM | POA: Diagnosis not present

## 2022-02-11 DIAGNOSIS — L299 Pruritus, unspecified: Secondary | ICD-10-CM | POA: Diagnosis not present

## 2022-02-11 DIAGNOSIS — D631 Anemia in chronic kidney disease: Secondary | ICD-10-CM | POA: Diagnosis not present

## 2022-02-11 DIAGNOSIS — R634 Abnormal weight loss: Secondary | ICD-10-CM | POA: Diagnosis not present

## 2022-02-11 DIAGNOSIS — I120 Hypertensive chronic kidney disease with stage 5 chronic kidney disease or end stage renal disease: Secondary | ICD-10-CM | POA: Diagnosis not present

## 2022-02-11 DIAGNOSIS — E1122 Type 2 diabetes mellitus with diabetic chronic kidney disease: Secondary | ICD-10-CM | POA: Diagnosis not present

## 2022-02-12 DIAGNOSIS — I120 Hypertensive chronic kidney disease with stage 5 chronic kidney disease or end stage renal disease: Secondary | ICD-10-CM | POA: Diagnosis not present

## 2022-02-12 DIAGNOSIS — L299 Pruritus, unspecified: Secondary | ICD-10-CM | POA: Diagnosis not present

## 2022-02-12 DIAGNOSIS — E1122 Type 2 diabetes mellitus with diabetic chronic kidney disease: Secondary | ICD-10-CM | POA: Diagnosis not present

## 2022-02-12 DIAGNOSIS — N186 End stage renal disease: Secondary | ICD-10-CM | POA: Diagnosis not present

## 2022-02-12 DIAGNOSIS — R634 Abnormal weight loss: Secondary | ICD-10-CM | POA: Diagnosis not present

## 2022-02-12 DIAGNOSIS — D631 Anemia in chronic kidney disease: Secondary | ICD-10-CM | POA: Diagnosis not present

## 2022-02-22 DIAGNOSIS — N186 End stage renal disease: Secondary | ICD-10-CM | POA: Diagnosis not present

## 2022-02-22 DIAGNOSIS — E1122 Type 2 diabetes mellitus with diabetic chronic kidney disease: Secondary | ICD-10-CM | POA: Diagnosis not present

## 2022-02-22 DIAGNOSIS — L299 Pruritus, unspecified: Secondary | ICD-10-CM | POA: Diagnosis not present

## 2022-02-22 DIAGNOSIS — R634 Abnormal weight loss: Secondary | ICD-10-CM | POA: Diagnosis not present

## 2022-02-22 DIAGNOSIS — D631 Anemia in chronic kidney disease: Secondary | ICD-10-CM | POA: Diagnosis not present

## 2022-02-22 DIAGNOSIS — I120 Hypertensive chronic kidney disease with stage 5 chronic kidney disease or end stage renal disease: Secondary | ICD-10-CM | POA: Diagnosis not present

## 2022-02-25 DIAGNOSIS — E1122 Type 2 diabetes mellitus with diabetic chronic kidney disease: Secondary | ICD-10-CM | POA: Diagnosis not present

## 2022-02-25 DIAGNOSIS — L299 Pruritus, unspecified: Secondary | ICD-10-CM | POA: Diagnosis not present

## 2022-02-25 DIAGNOSIS — R634 Abnormal weight loss: Secondary | ICD-10-CM | POA: Diagnosis not present

## 2022-02-25 DIAGNOSIS — D631 Anemia in chronic kidney disease: Secondary | ICD-10-CM | POA: Diagnosis not present

## 2022-02-25 DIAGNOSIS — N186 End stage renal disease: Secondary | ICD-10-CM | POA: Diagnosis not present

## 2022-02-25 DIAGNOSIS — I120 Hypertensive chronic kidney disease with stage 5 chronic kidney disease or end stage renal disease: Secondary | ICD-10-CM | POA: Diagnosis not present

## 2022-02-26 DIAGNOSIS — L299 Pruritus, unspecified: Secondary | ICD-10-CM | POA: Diagnosis not present

## 2022-02-26 DIAGNOSIS — I4891 Unspecified atrial fibrillation: Secondary | ICD-10-CM | POA: Diagnosis not present

## 2022-02-26 DIAGNOSIS — H409 Unspecified glaucoma: Secondary | ICD-10-CM | POA: Diagnosis not present

## 2022-02-26 DIAGNOSIS — I252 Old myocardial infarction: Secondary | ICD-10-CM | POA: Diagnosis not present

## 2022-02-26 DIAGNOSIS — H04129 Dry eye syndrome of unspecified lacrimal gland: Secondary | ICD-10-CM | POA: Diagnosis not present

## 2022-02-26 DIAGNOSIS — E559 Vitamin D deficiency, unspecified: Secondary | ICD-10-CM | POA: Diagnosis not present

## 2022-02-26 DIAGNOSIS — R634 Abnormal weight loss: Secondary | ICD-10-CM | POA: Diagnosis not present

## 2022-02-26 DIAGNOSIS — N186 End stage renal disease: Secondary | ICD-10-CM | POA: Diagnosis not present

## 2022-02-26 DIAGNOSIS — K219 Gastro-esophageal reflux disease without esophagitis: Secondary | ICD-10-CM | POA: Diagnosis not present

## 2022-02-26 DIAGNOSIS — I120 Hypertensive chronic kidney disease with stage 5 chronic kidney disease or end stage renal disease: Secondary | ICD-10-CM | POA: Diagnosis not present

## 2022-02-26 DIAGNOSIS — I25119 Atherosclerotic heart disease of native coronary artery with unspecified angina pectoris: Secondary | ICD-10-CM | POA: Diagnosis not present

## 2022-02-26 DIAGNOSIS — E785 Hyperlipidemia, unspecified: Secondary | ICD-10-CM | POA: Diagnosis not present

## 2022-02-26 DIAGNOSIS — I051 Rheumatic mitral insufficiency: Secondary | ICD-10-CM | POA: Diagnosis not present

## 2022-02-26 DIAGNOSIS — E1122 Type 2 diabetes mellitus with diabetic chronic kidney disease: Secondary | ICD-10-CM | POA: Diagnosis not present

## 2022-02-26 DIAGNOSIS — D631 Anemia in chronic kidney disease: Secondary | ICD-10-CM | POA: Diagnosis not present

## 2022-03-03 DIAGNOSIS — R634 Abnormal weight loss: Secondary | ICD-10-CM | POA: Diagnosis not present

## 2022-03-03 DIAGNOSIS — D631 Anemia in chronic kidney disease: Secondary | ICD-10-CM | POA: Diagnosis not present

## 2022-03-03 DIAGNOSIS — N186 End stage renal disease: Secondary | ICD-10-CM | POA: Diagnosis not present

## 2022-03-03 DIAGNOSIS — E1122 Type 2 diabetes mellitus with diabetic chronic kidney disease: Secondary | ICD-10-CM | POA: Diagnosis not present

## 2022-03-03 DIAGNOSIS — I120 Hypertensive chronic kidney disease with stage 5 chronic kidney disease or end stage renal disease: Secondary | ICD-10-CM | POA: Diagnosis not present

## 2022-03-03 DIAGNOSIS — L299 Pruritus, unspecified: Secondary | ICD-10-CM | POA: Diagnosis not present

## 2022-03-12 DIAGNOSIS — N186 End stage renal disease: Secondary | ICD-10-CM | POA: Diagnosis not present

## 2022-03-12 DIAGNOSIS — L299 Pruritus, unspecified: Secondary | ICD-10-CM | POA: Diagnosis not present

## 2022-03-12 DIAGNOSIS — D631 Anemia in chronic kidney disease: Secondary | ICD-10-CM | POA: Diagnosis not present

## 2022-03-12 DIAGNOSIS — E1122 Type 2 diabetes mellitus with diabetic chronic kidney disease: Secondary | ICD-10-CM | POA: Diagnosis not present

## 2022-03-12 DIAGNOSIS — R634 Abnormal weight loss: Secondary | ICD-10-CM | POA: Diagnosis not present

## 2022-03-12 DIAGNOSIS — I120 Hypertensive chronic kidney disease with stage 5 chronic kidney disease or end stage renal disease: Secondary | ICD-10-CM | POA: Diagnosis not present

## 2022-03-16 DIAGNOSIS — D631 Anemia in chronic kidney disease: Secondary | ICD-10-CM | POA: Diagnosis not present

## 2022-03-16 DIAGNOSIS — N186 End stage renal disease: Secondary | ICD-10-CM | POA: Diagnosis not present

## 2022-03-16 DIAGNOSIS — L299 Pruritus, unspecified: Secondary | ICD-10-CM | POA: Diagnosis not present

## 2022-03-16 DIAGNOSIS — R634 Abnormal weight loss: Secondary | ICD-10-CM | POA: Diagnosis not present

## 2022-03-16 DIAGNOSIS — E1122 Type 2 diabetes mellitus with diabetic chronic kidney disease: Secondary | ICD-10-CM | POA: Diagnosis not present

## 2022-03-16 DIAGNOSIS — I120 Hypertensive chronic kidney disease with stage 5 chronic kidney disease or end stage renal disease: Secondary | ICD-10-CM | POA: Diagnosis not present

## 2022-03-17 DIAGNOSIS — R634 Abnormal weight loss: Secondary | ICD-10-CM | POA: Diagnosis not present

## 2022-03-17 DIAGNOSIS — E1122 Type 2 diabetes mellitus with diabetic chronic kidney disease: Secondary | ICD-10-CM | POA: Diagnosis not present

## 2022-03-17 DIAGNOSIS — I120 Hypertensive chronic kidney disease with stage 5 chronic kidney disease or end stage renal disease: Secondary | ICD-10-CM | POA: Diagnosis not present

## 2022-03-17 DIAGNOSIS — N186 End stage renal disease: Secondary | ICD-10-CM | POA: Diagnosis not present

## 2022-03-17 DIAGNOSIS — L299 Pruritus, unspecified: Secondary | ICD-10-CM | POA: Diagnosis not present

## 2022-03-17 DIAGNOSIS — D631 Anemia in chronic kidney disease: Secondary | ICD-10-CM | POA: Diagnosis not present

## 2022-03-24 DIAGNOSIS — E1122 Type 2 diabetes mellitus with diabetic chronic kidney disease: Secondary | ICD-10-CM | POA: Diagnosis not present

## 2022-03-24 DIAGNOSIS — N186 End stage renal disease: Secondary | ICD-10-CM | POA: Diagnosis not present

## 2022-03-24 DIAGNOSIS — L299 Pruritus, unspecified: Secondary | ICD-10-CM | POA: Diagnosis not present

## 2022-03-24 DIAGNOSIS — D631 Anemia in chronic kidney disease: Secondary | ICD-10-CM | POA: Diagnosis not present

## 2022-03-24 DIAGNOSIS — R634 Abnormal weight loss: Secondary | ICD-10-CM | POA: Diagnosis not present

## 2022-03-24 DIAGNOSIS — I120 Hypertensive chronic kidney disease with stage 5 chronic kidney disease or end stage renal disease: Secondary | ICD-10-CM | POA: Diagnosis not present

## 2022-03-29 DIAGNOSIS — K219 Gastro-esophageal reflux disease without esophagitis: Secondary | ICD-10-CM | POA: Diagnosis not present

## 2022-03-29 DIAGNOSIS — I252 Old myocardial infarction: Secondary | ICD-10-CM | POA: Diagnosis not present

## 2022-03-29 DIAGNOSIS — I4891 Unspecified atrial fibrillation: Secondary | ICD-10-CM | POA: Diagnosis not present

## 2022-03-29 DIAGNOSIS — E1122 Type 2 diabetes mellitus with diabetic chronic kidney disease: Secondary | ICD-10-CM | POA: Diagnosis not present

## 2022-03-29 DIAGNOSIS — H409 Unspecified glaucoma: Secondary | ICD-10-CM | POA: Diagnosis not present

## 2022-03-29 DIAGNOSIS — R634 Abnormal weight loss: Secondary | ICD-10-CM | POA: Diagnosis not present

## 2022-03-29 DIAGNOSIS — N186 End stage renal disease: Secondary | ICD-10-CM | POA: Diagnosis not present

## 2022-03-29 DIAGNOSIS — I051 Rheumatic mitral insufficiency: Secondary | ICD-10-CM | POA: Diagnosis not present

## 2022-03-29 DIAGNOSIS — E559 Vitamin D deficiency, unspecified: Secondary | ICD-10-CM | POA: Diagnosis not present

## 2022-03-29 DIAGNOSIS — I25119 Atherosclerotic heart disease of native coronary artery with unspecified angina pectoris: Secondary | ICD-10-CM | POA: Diagnosis not present

## 2022-03-29 DIAGNOSIS — H04129 Dry eye syndrome of unspecified lacrimal gland: Secondary | ICD-10-CM | POA: Diagnosis not present

## 2022-03-29 DIAGNOSIS — I120 Hypertensive chronic kidney disease with stage 5 chronic kidney disease or end stage renal disease: Secondary | ICD-10-CM | POA: Diagnosis not present

## 2022-03-29 DIAGNOSIS — E785 Hyperlipidemia, unspecified: Secondary | ICD-10-CM | POA: Diagnosis not present

## 2022-03-29 DIAGNOSIS — D631 Anemia in chronic kidney disease: Secondary | ICD-10-CM | POA: Diagnosis not present

## 2022-03-29 DIAGNOSIS — L299 Pruritus, unspecified: Secondary | ICD-10-CM | POA: Diagnosis not present

## 2022-03-31 DIAGNOSIS — L299 Pruritus, unspecified: Secondary | ICD-10-CM | POA: Diagnosis not present

## 2022-03-31 DIAGNOSIS — D631 Anemia in chronic kidney disease: Secondary | ICD-10-CM | POA: Diagnosis not present

## 2022-03-31 DIAGNOSIS — E1122 Type 2 diabetes mellitus with diabetic chronic kidney disease: Secondary | ICD-10-CM | POA: Diagnosis not present

## 2022-03-31 DIAGNOSIS — I120 Hypertensive chronic kidney disease with stage 5 chronic kidney disease or end stage renal disease: Secondary | ICD-10-CM | POA: Diagnosis not present

## 2022-03-31 DIAGNOSIS — N186 End stage renal disease: Secondary | ICD-10-CM | POA: Diagnosis not present

## 2022-03-31 DIAGNOSIS — R634 Abnormal weight loss: Secondary | ICD-10-CM | POA: Diagnosis not present

## 2022-04-06 DIAGNOSIS — N186 End stage renal disease: Secondary | ICD-10-CM | POA: Diagnosis not present

## 2022-04-06 DIAGNOSIS — I120 Hypertensive chronic kidney disease with stage 5 chronic kidney disease or end stage renal disease: Secondary | ICD-10-CM | POA: Diagnosis not present

## 2022-04-06 DIAGNOSIS — E1122 Type 2 diabetes mellitus with diabetic chronic kidney disease: Secondary | ICD-10-CM | POA: Diagnosis not present

## 2022-04-06 DIAGNOSIS — L299 Pruritus, unspecified: Secondary | ICD-10-CM | POA: Diagnosis not present

## 2022-04-06 DIAGNOSIS — D631 Anemia in chronic kidney disease: Secondary | ICD-10-CM | POA: Diagnosis not present

## 2022-04-06 DIAGNOSIS — R634 Abnormal weight loss: Secondary | ICD-10-CM | POA: Diagnosis not present

## 2022-04-14 DIAGNOSIS — D631 Anemia in chronic kidney disease: Secondary | ICD-10-CM | POA: Diagnosis not present

## 2022-04-14 DIAGNOSIS — R634 Abnormal weight loss: Secondary | ICD-10-CM | POA: Diagnosis not present

## 2022-04-14 DIAGNOSIS — N186 End stage renal disease: Secondary | ICD-10-CM | POA: Diagnosis not present

## 2022-04-14 DIAGNOSIS — E1122 Type 2 diabetes mellitus with diabetic chronic kidney disease: Secondary | ICD-10-CM | POA: Diagnosis not present

## 2022-04-14 DIAGNOSIS — I120 Hypertensive chronic kidney disease with stage 5 chronic kidney disease or end stage renal disease: Secondary | ICD-10-CM | POA: Diagnosis not present

## 2022-04-14 DIAGNOSIS — L299 Pruritus, unspecified: Secondary | ICD-10-CM | POA: Diagnosis not present

## 2022-04-20 DIAGNOSIS — N186 End stage renal disease: Secondary | ICD-10-CM | POA: Diagnosis not present

## 2022-04-20 DIAGNOSIS — E1122 Type 2 diabetes mellitus with diabetic chronic kidney disease: Secondary | ICD-10-CM | POA: Diagnosis not present

## 2022-04-20 DIAGNOSIS — I120 Hypertensive chronic kidney disease with stage 5 chronic kidney disease or end stage renal disease: Secondary | ICD-10-CM | POA: Diagnosis not present

## 2022-04-20 DIAGNOSIS — D631 Anemia in chronic kidney disease: Secondary | ICD-10-CM | POA: Diagnosis not present

## 2022-04-20 DIAGNOSIS — L299 Pruritus, unspecified: Secondary | ICD-10-CM | POA: Diagnosis not present

## 2022-04-20 DIAGNOSIS — R634 Abnormal weight loss: Secondary | ICD-10-CM | POA: Diagnosis not present

## 2022-04-21 DIAGNOSIS — R634 Abnormal weight loss: Secondary | ICD-10-CM | POA: Diagnosis not present

## 2022-04-21 DIAGNOSIS — E1122 Type 2 diabetes mellitus with diabetic chronic kidney disease: Secondary | ICD-10-CM | POA: Diagnosis not present

## 2022-04-21 DIAGNOSIS — I120 Hypertensive chronic kidney disease with stage 5 chronic kidney disease or end stage renal disease: Secondary | ICD-10-CM | POA: Diagnosis not present

## 2022-04-21 DIAGNOSIS — L299 Pruritus, unspecified: Secondary | ICD-10-CM | POA: Diagnosis not present

## 2022-04-21 DIAGNOSIS — D631 Anemia in chronic kidney disease: Secondary | ICD-10-CM | POA: Diagnosis not present

## 2022-04-21 DIAGNOSIS — N186 End stage renal disease: Secondary | ICD-10-CM | POA: Diagnosis not present

## 2022-04-28 DIAGNOSIS — E1122 Type 2 diabetes mellitus with diabetic chronic kidney disease: Secondary | ICD-10-CM | POA: Diagnosis not present

## 2022-04-28 DIAGNOSIS — N186 End stage renal disease: Secondary | ICD-10-CM | POA: Diagnosis not present

## 2022-04-28 DIAGNOSIS — D631 Anemia in chronic kidney disease: Secondary | ICD-10-CM | POA: Diagnosis not present

## 2022-04-28 DIAGNOSIS — I120 Hypertensive chronic kidney disease with stage 5 chronic kidney disease or end stage renal disease: Secondary | ICD-10-CM | POA: Diagnosis not present

## 2022-04-28 DIAGNOSIS — L299 Pruritus, unspecified: Secondary | ICD-10-CM | POA: Diagnosis not present

## 2022-04-28 DIAGNOSIS — R634 Abnormal weight loss: Secondary | ICD-10-CM | POA: Diagnosis not present

## 2022-04-29 DIAGNOSIS — K219 Gastro-esophageal reflux disease without esophagitis: Secondary | ICD-10-CM | POA: Diagnosis not present

## 2022-04-29 DIAGNOSIS — E785 Hyperlipidemia, unspecified: Secondary | ICD-10-CM | POA: Diagnosis not present

## 2022-04-29 DIAGNOSIS — I252 Old myocardial infarction: Secondary | ICD-10-CM | POA: Diagnosis not present

## 2022-04-29 DIAGNOSIS — N186 End stage renal disease: Secondary | ICD-10-CM | POA: Diagnosis not present

## 2022-04-29 DIAGNOSIS — D631 Anemia in chronic kidney disease: Secondary | ICD-10-CM | POA: Diagnosis not present

## 2022-04-29 DIAGNOSIS — I25119 Atherosclerotic heart disease of native coronary artery with unspecified angina pectoris: Secondary | ICD-10-CM | POA: Diagnosis not present

## 2022-04-29 DIAGNOSIS — E559 Vitamin D deficiency, unspecified: Secondary | ICD-10-CM | POA: Diagnosis not present

## 2022-04-29 DIAGNOSIS — E1122 Type 2 diabetes mellitus with diabetic chronic kidney disease: Secondary | ICD-10-CM | POA: Diagnosis not present

## 2022-04-29 DIAGNOSIS — H04129 Dry eye syndrome of unspecified lacrimal gland: Secondary | ICD-10-CM | POA: Diagnosis not present

## 2022-04-29 DIAGNOSIS — I4891 Unspecified atrial fibrillation: Secondary | ICD-10-CM | POA: Diagnosis not present

## 2022-04-29 DIAGNOSIS — I051 Rheumatic mitral insufficiency: Secondary | ICD-10-CM | POA: Diagnosis not present

## 2022-04-29 DIAGNOSIS — R634 Abnormal weight loss: Secondary | ICD-10-CM | POA: Diagnosis not present

## 2022-04-29 DIAGNOSIS — H409 Unspecified glaucoma: Secondary | ICD-10-CM | POA: Diagnosis not present

## 2022-04-29 DIAGNOSIS — L299 Pruritus, unspecified: Secondary | ICD-10-CM | POA: Diagnosis not present

## 2022-04-29 DIAGNOSIS — I120 Hypertensive chronic kidney disease with stage 5 chronic kidney disease or end stage renal disease: Secondary | ICD-10-CM | POA: Diagnosis not present

## 2022-05-05 DIAGNOSIS — I120 Hypertensive chronic kidney disease with stage 5 chronic kidney disease or end stage renal disease: Secondary | ICD-10-CM | POA: Diagnosis not present

## 2022-05-05 DIAGNOSIS — N186 End stage renal disease: Secondary | ICD-10-CM | POA: Diagnosis not present

## 2022-05-05 DIAGNOSIS — D631 Anemia in chronic kidney disease: Secondary | ICD-10-CM | POA: Diagnosis not present

## 2022-05-05 DIAGNOSIS — E1122 Type 2 diabetes mellitus with diabetic chronic kidney disease: Secondary | ICD-10-CM | POA: Diagnosis not present

## 2022-05-05 DIAGNOSIS — R634 Abnormal weight loss: Secondary | ICD-10-CM | POA: Diagnosis not present

## 2022-05-05 DIAGNOSIS — L299 Pruritus, unspecified: Secondary | ICD-10-CM | POA: Diagnosis not present

## 2022-05-12 DIAGNOSIS — D631 Anemia in chronic kidney disease: Secondary | ICD-10-CM | POA: Diagnosis not present

## 2022-05-12 DIAGNOSIS — L299 Pruritus, unspecified: Secondary | ICD-10-CM | POA: Diagnosis not present

## 2022-05-12 DIAGNOSIS — I120 Hypertensive chronic kidney disease with stage 5 chronic kidney disease or end stage renal disease: Secondary | ICD-10-CM | POA: Diagnosis not present

## 2022-05-12 DIAGNOSIS — R634 Abnormal weight loss: Secondary | ICD-10-CM | POA: Diagnosis not present

## 2022-05-12 DIAGNOSIS — N186 End stage renal disease: Secondary | ICD-10-CM | POA: Diagnosis not present

## 2022-05-12 DIAGNOSIS — E1122 Type 2 diabetes mellitus with diabetic chronic kidney disease: Secondary | ICD-10-CM | POA: Diagnosis not present

## 2022-05-13 DIAGNOSIS — N186 End stage renal disease: Secondary | ICD-10-CM | POA: Diagnosis not present

## 2022-05-13 DIAGNOSIS — D631 Anemia in chronic kidney disease: Secondary | ICD-10-CM | POA: Diagnosis not present

## 2022-05-13 DIAGNOSIS — R634 Abnormal weight loss: Secondary | ICD-10-CM | POA: Diagnosis not present

## 2022-05-13 DIAGNOSIS — E1122 Type 2 diabetes mellitus with diabetic chronic kidney disease: Secondary | ICD-10-CM | POA: Diagnosis not present

## 2022-05-13 DIAGNOSIS — L299 Pruritus, unspecified: Secondary | ICD-10-CM | POA: Diagnosis not present

## 2022-05-13 DIAGNOSIS — I120 Hypertensive chronic kidney disease with stage 5 chronic kidney disease or end stage renal disease: Secondary | ICD-10-CM | POA: Diagnosis not present

## 2022-05-19 DIAGNOSIS — I120 Hypertensive chronic kidney disease with stage 5 chronic kidney disease or end stage renal disease: Secondary | ICD-10-CM | POA: Diagnosis not present

## 2022-05-19 DIAGNOSIS — N186 End stage renal disease: Secondary | ICD-10-CM | POA: Diagnosis not present

## 2022-05-19 DIAGNOSIS — D631 Anemia in chronic kidney disease: Secondary | ICD-10-CM | POA: Diagnosis not present

## 2022-05-19 DIAGNOSIS — R634 Abnormal weight loss: Secondary | ICD-10-CM | POA: Diagnosis not present

## 2022-05-19 DIAGNOSIS — E1122 Type 2 diabetes mellitus with diabetic chronic kidney disease: Secondary | ICD-10-CM | POA: Diagnosis not present

## 2022-05-19 DIAGNOSIS — L299 Pruritus, unspecified: Secondary | ICD-10-CM | POA: Diagnosis not present

## 2022-05-26 DIAGNOSIS — N186 End stage renal disease: Secondary | ICD-10-CM | POA: Diagnosis not present

## 2022-05-26 DIAGNOSIS — I120 Hypertensive chronic kidney disease with stage 5 chronic kidney disease or end stage renal disease: Secondary | ICD-10-CM | POA: Diagnosis not present

## 2022-05-26 DIAGNOSIS — D631 Anemia in chronic kidney disease: Secondary | ICD-10-CM | POA: Diagnosis not present

## 2022-05-26 DIAGNOSIS — R634 Abnormal weight loss: Secondary | ICD-10-CM | POA: Diagnosis not present

## 2022-05-26 DIAGNOSIS — L299 Pruritus, unspecified: Secondary | ICD-10-CM | POA: Diagnosis not present

## 2022-05-26 DIAGNOSIS — E1122 Type 2 diabetes mellitus with diabetic chronic kidney disease: Secondary | ICD-10-CM | POA: Diagnosis not present

## 2022-05-28 DIAGNOSIS — L299 Pruritus, unspecified: Secondary | ICD-10-CM | POA: Diagnosis not present

## 2022-05-28 DIAGNOSIS — D631 Anemia in chronic kidney disease: Secondary | ICD-10-CM | POA: Diagnosis not present

## 2022-05-28 DIAGNOSIS — E1122 Type 2 diabetes mellitus with diabetic chronic kidney disease: Secondary | ICD-10-CM | POA: Diagnosis not present

## 2022-05-28 DIAGNOSIS — H409 Unspecified glaucoma: Secondary | ICD-10-CM | POA: Diagnosis not present

## 2022-05-28 DIAGNOSIS — R159 Full incontinence of feces: Secondary | ICD-10-CM | POA: Diagnosis not present

## 2022-05-28 DIAGNOSIS — I4891 Unspecified atrial fibrillation: Secondary | ICD-10-CM | POA: Diagnosis not present

## 2022-05-28 DIAGNOSIS — I120 Hypertensive chronic kidney disease with stage 5 chronic kidney disease or end stage renal disease: Secondary | ICD-10-CM | POA: Diagnosis not present

## 2022-05-28 DIAGNOSIS — I252 Old myocardial infarction: Secondary | ICD-10-CM | POA: Diagnosis not present

## 2022-05-28 DIAGNOSIS — I051 Rheumatic mitral insufficiency: Secondary | ICD-10-CM | POA: Diagnosis not present

## 2022-05-28 DIAGNOSIS — H04129 Dry eye syndrome of unspecified lacrimal gland: Secondary | ICD-10-CM | POA: Diagnosis not present

## 2022-05-28 DIAGNOSIS — K219 Gastro-esophageal reflux disease without esophagitis: Secondary | ICD-10-CM | POA: Diagnosis not present

## 2022-05-28 DIAGNOSIS — R634 Abnormal weight loss: Secondary | ICD-10-CM | POA: Diagnosis not present

## 2022-05-28 DIAGNOSIS — E559 Vitamin D deficiency, unspecified: Secondary | ICD-10-CM | POA: Diagnosis not present

## 2022-05-28 DIAGNOSIS — R32 Unspecified urinary incontinence: Secondary | ICD-10-CM | POA: Diagnosis not present

## 2022-05-28 DIAGNOSIS — N186 End stage renal disease: Secondary | ICD-10-CM | POA: Diagnosis not present

## 2022-05-28 DIAGNOSIS — E785 Hyperlipidemia, unspecified: Secondary | ICD-10-CM | POA: Diagnosis not present

## 2022-05-28 DIAGNOSIS — I25119 Atherosclerotic heart disease of native coronary artery with unspecified angina pectoris: Secondary | ICD-10-CM | POA: Diagnosis not present

## 2022-06-02 DIAGNOSIS — L299 Pruritus, unspecified: Secondary | ICD-10-CM | POA: Diagnosis not present

## 2022-06-02 DIAGNOSIS — E1122 Type 2 diabetes mellitus with diabetic chronic kidney disease: Secondary | ICD-10-CM | POA: Diagnosis not present

## 2022-06-02 DIAGNOSIS — R634 Abnormal weight loss: Secondary | ICD-10-CM | POA: Diagnosis not present

## 2022-06-02 DIAGNOSIS — D631 Anemia in chronic kidney disease: Secondary | ICD-10-CM | POA: Diagnosis not present

## 2022-06-02 DIAGNOSIS — I120 Hypertensive chronic kidney disease with stage 5 chronic kidney disease or end stage renal disease: Secondary | ICD-10-CM | POA: Diagnosis not present

## 2022-06-02 DIAGNOSIS — N186 End stage renal disease: Secondary | ICD-10-CM | POA: Diagnosis not present

## 2022-06-08 DIAGNOSIS — E1122 Type 2 diabetes mellitus with diabetic chronic kidney disease: Secondary | ICD-10-CM | POA: Diagnosis not present

## 2022-06-08 DIAGNOSIS — N186 End stage renal disease: Secondary | ICD-10-CM | POA: Diagnosis not present

## 2022-06-08 DIAGNOSIS — D631 Anemia in chronic kidney disease: Secondary | ICD-10-CM | POA: Diagnosis not present

## 2022-06-08 DIAGNOSIS — L299 Pruritus, unspecified: Secondary | ICD-10-CM | POA: Diagnosis not present

## 2022-06-08 DIAGNOSIS — I120 Hypertensive chronic kidney disease with stage 5 chronic kidney disease or end stage renal disease: Secondary | ICD-10-CM | POA: Diagnosis not present

## 2022-06-08 DIAGNOSIS — R634 Abnormal weight loss: Secondary | ICD-10-CM | POA: Diagnosis not present

## 2022-06-09 DIAGNOSIS — I120 Hypertensive chronic kidney disease with stage 5 chronic kidney disease or end stage renal disease: Secondary | ICD-10-CM | POA: Diagnosis not present

## 2022-06-09 DIAGNOSIS — L299 Pruritus, unspecified: Secondary | ICD-10-CM | POA: Diagnosis not present

## 2022-06-09 DIAGNOSIS — E1122 Type 2 diabetes mellitus with diabetic chronic kidney disease: Secondary | ICD-10-CM | POA: Diagnosis not present

## 2022-06-09 DIAGNOSIS — N186 End stage renal disease: Secondary | ICD-10-CM | POA: Diagnosis not present

## 2022-06-09 DIAGNOSIS — R634 Abnormal weight loss: Secondary | ICD-10-CM | POA: Diagnosis not present

## 2022-06-09 DIAGNOSIS — D631 Anemia in chronic kidney disease: Secondary | ICD-10-CM | POA: Diagnosis not present

## 2022-06-16 DIAGNOSIS — I120 Hypertensive chronic kidney disease with stage 5 chronic kidney disease or end stage renal disease: Secondary | ICD-10-CM | POA: Diagnosis not present

## 2022-06-16 DIAGNOSIS — D631 Anemia in chronic kidney disease: Secondary | ICD-10-CM | POA: Diagnosis not present

## 2022-06-16 DIAGNOSIS — E1122 Type 2 diabetes mellitus with diabetic chronic kidney disease: Secondary | ICD-10-CM | POA: Diagnosis not present

## 2022-06-16 DIAGNOSIS — L299 Pruritus, unspecified: Secondary | ICD-10-CM | POA: Diagnosis not present

## 2022-06-16 DIAGNOSIS — R634 Abnormal weight loss: Secondary | ICD-10-CM | POA: Diagnosis not present

## 2022-06-16 DIAGNOSIS — N186 End stage renal disease: Secondary | ICD-10-CM | POA: Diagnosis not present

## 2022-06-17 DIAGNOSIS — E1122 Type 2 diabetes mellitus with diabetic chronic kidney disease: Secondary | ICD-10-CM | POA: Diagnosis not present

## 2022-06-17 DIAGNOSIS — R634 Abnormal weight loss: Secondary | ICD-10-CM | POA: Diagnosis not present

## 2022-06-17 DIAGNOSIS — L299 Pruritus, unspecified: Secondary | ICD-10-CM | POA: Diagnosis not present

## 2022-06-17 DIAGNOSIS — I120 Hypertensive chronic kidney disease with stage 5 chronic kidney disease or end stage renal disease: Secondary | ICD-10-CM | POA: Diagnosis not present

## 2022-06-17 DIAGNOSIS — N186 End stage renal disease: Secondary | ICD-10-CM | POA: Diagnosis not present

## 2022-06-17 DIAGNOSIS — D631 Anemia in chronic kidney disease: Secondary | ICD-10-CM | POA: Diagnosis not present

## 2022-06-18 DIAGNOSIS — E1122 Type 2 diabetes mellitus with diabetic chronic kidney disease: Secondary | ICD-10-CM | POA: Diagnosis not present

## 2022-06-18 DIAGNOSIS — D631 Anemia in chronic kidney disease: Secondary | ICD-10-CM | POA: Diagnosis not present

## 2022-06-18 DIAGNOSIS — N186 End stage renal disease: Secondary | ICD-10-CM | POA: Diagnosis not present

## 2022-06-18 DIAGNOSIS — I120 Hypertensive chronic kidney disease with stage 5 chronic kidney disease or end stage renal disease: Secondary | ICD-10-CM | POA: Diagnosis not present

## 2022-06-18 DIAGNOSIS — R634 Abnormal weight loss: Secondary | ICD-10-CM | POA: Diagnosis not present

## 2022-06-18 DIAGNOSIS — L299 Pruritus, unspecified: Secondary | ICD-10-CM | POA: Diagnosis not present

## 2022-06-20 DIAGNOSIS — R634 Abnormal weight loss: Secondary | ICD-10-CM | POA: Diagnosis not present

## 2022-06-20 DIAGNOSIS — I120 Hypertensive chronic kidney disease with stage 5 chronic kidney disease or end stage renal disease: Secondary | ICD-10-CM | POA: Diagnosis not present

## 2022-06-20 DIAGNOSIS — D631 Anemia in chronic kidney disease: Secondary | ICD-10-CM | POA: Diagnosis not present

## 2022-06-20 DIAGNOSIS — L299 Pruritus, unspecified: Secondary | ICD-10-CM | POA: Diagnosis not present

## 2022-06-20 DIAGNOSIS — N186 End stage renal disease: Secondary | ICD-10-CM | POA: Diagnosis not present

## 2022-06-20 DIAGNOSIS — E1122 Type 2 diabetes mellitus with diabetic chronic kidney disease: Secondary | ICD-10-CM | POA: Diagnosis not present

## 2022-06-21 DIAGNOSIS — E1122 Type 2 diabetes mellitus with diabetic chronic kidney disease: Secondary | ICD-10-CM | POA: Diagnosis not present

## 2022-06-21 DIAGNOSIS — N186 End stage renal disease: Secondary | ICD-10-CM | POA: Diagnosis not present

## 2022-06-21 DIAGNOSIS — R634 Abnormal weight loss: Secondary | ICD-10-CM | POA: Diagnosis not present

## 2022-06-21 DIAGNOSIS — D631 Anemia in chronic kidney disease: Secondary | ICD-10-CM | POA: Diagnosis not present

## 2022-06-21 DIAGNOSIS — I120 Hypertensive chronic kidney disease with stage 5 chronic kidney disease or end stage renal disease: Secondary | ICD-10-CM | POA: Diagnosis not present

## 2022-06-21 DIAGNOSIS — L299 Pruritus, unspecified: Secondary | ICD-10-CM | POA: Diagnosis not present

## 2022-06-22 DIAGNOSIS — N186 End stage renal disease: Secondary | ICD-10-CM | POA: Diagnosis not present

## 2022-06-22 DIAGNOSIS — D631 Anemia in chronic kidney disease: Secondary | ICD-10-CM | POA: Diagnosis not present

## 2022-06-22 DIAGNOSIS — L299 Pruritus, unspecified: Secondary | ICD-10-CM | POA: Diagnosis not present

## 2022-06-22 DIAGNOSIS — E1122 Type 2 diabetes mellitus with diabetic chronic kidney disease: Secondary | ICD-10-CM | POA: Diagnosis not present

## 2022-06-22 DIAGNOSIS — I120 Hypertensive chronic kidney disease with stage 5 chronic kidney disease or end stage renal disease: Secondary | ICD-10-CM | POA: Diagnosis not present

## 2022-06-22 DIAGNOSIS — R634 Abnormal weight loss: Secondary | ICD-10-CM | POA: Diagnosis not present

## 2022-06-28 DEATH — deceased

## 2022-12-01 ENCOUNTER — Ambulatory Visit: Payer: Self-pay | Admitting: Cardiology
